# Patient Record
Sex: Female | Born: 1975 | Hispanic: Yes | Marital: Married | Smoking: Never smoker
Health system: Southern US, Community
[De-identification: ages and names within clinical notes are randomized; demographics above are authoritative.]

## PROBLEM LIST (undated history)

## (undated) ENCOUNTER — Inpatient Hospital Stay (HOSPITAL_COMMUNITY): Payer: Self-pay

## (undated) DIAGNOSIS — R112 Nausea with vomiting, unspecified: Secondary | ICD-10-CM

## (undated) DIAGNOSIS — K602 Anal fissure, unspecified: Secondary | ICD-10-CM

## (undated) DIAGNOSIS — E039 Hypothyroidism, unspecified: Secondary | ICD-10-CM

## (undated) DIAGNOSIS — Z1501 Genetic susceptibility to malignant neoplasm of breast: Secondary | ICD-10-CM

## (undated) DIAGNOSIS — A048 Other specified bacterial intestinal infections: Secondary | ICD-10-CM

## (undated) DIAGNOSIS — R600 Localized edema: Secondary | ICD-10-CM

## (undated) DIAGNOSIS — K529 Noninfective gastroenteritis and colitis, unspecified: Secondary | ICD-10-CM

## (undated) DIAGNOSIS — F32A Depression, unspecified: Secondary | ICD-10-CM

## (undated) DIAGNOSIS — Z9889 Other specified postprocedural states: Secondary | ICD-10-CM

## (undated) DIAGNOSIS — K760 Fatty (change of) liver, not elsewhere classified: Secondary | ICD-10-CM

## (undated) DIAGNOSIS — E559 Vitamin D deficiency, unspecified: Secondary | ICD-10-CM

## (undated) DIAGNOSIS — I959 Hypotension, unspecified: Secondary | ICD-10-CM

## (undated) DIAGNOSIS — Z1509 Genetic susceptibility to other malignant neoplasm: Secondary | ICD-10-CM

## (undated) HISTORY — DX: Anal fissure, unspecified: K60.2

## (undated) HISTORY — DX: Vitamin D deficiency, unspecified: E55.9

## (undated) HISTORY — DX: Genetic susceptibility to malignant neoplasm of breast: Z15.09

## (undated) HISTORY — PX: NO PAST SURGERIES: SHX2092

## (undated) HISTORY — DX: Genetic susceptibility to other malignant neoplasm: Z15.01

## (undated) HISTORY — DX: Hypotension, unspecified: I95.9

## (undated) HISTORY — DX: Localized edema: R60.0

## (undated) HISTORY — DX: Hypothyroidism, unspecified: E03.9

## (undated) HISTORY — DX: Fatty (change of) liver, not elsewhere classified: K76.0

## (undated) HISTORY — DX: Other specified bacterial intestinal infections: A04.8

---

## 2012-03-19 ENCOUNTER — Encounter (HOSPITAL_COMMUNITY): Payer: Self-pay | Admitting: *Deleted

## 2012-03-19 ENCOUNTER — Emergency Department (HOSPITAL_COMMUNITY)
Admission: EM | Admit: 2012-03-19 | Discharge: 2012-03-19 | Disposition: A | Discharge status: Home / Self Care | Payer: Medicaid Other | Attending: Emergency Medicine | Admitting: Emergency Medicine

## 2012-03-19 ENCOUNTER — Emergency Department (HOSPITAL_COMMUNITY): Payer: Medicaid Other

## 2012-03-19 DIAGNOSIS — R112 Nausea with vomiting, unspecified: Secondary | ICD-10-CM | POA: Insufficient documentation

## 2012-03-19 DIAGNOSIS — R109 Unspecified abdominal pain: Secondary | ICD-10-CM

## 2012-03-19 DIAGNOSIS — M549 Dorsalgia, unspecified: Secondary | ICD-10-CM | POA: Insufficient documentation

## 2012-03-19 DIAGNOSIS — R1013 Epigastric pain: Secondary | ICD-10-CM | POA: Insufficient documentation

## 2012-03-19 DIAGNOSIS — R42 Dizziness and giddiness: Secondary | ICD-10-CM | POA: Insufficient documentation

## 2012-03-19 DIAGNOSIS — R209 Unspecified disturbances of skin sensation: Secondary | ICD-10-CM | POA: Insufficient documentation

## 2012-03-19 LAB — COMPREHENSIVE METABOLIC PANEL
AST: 20 U/L (ref 0–37)
Alkaline Phosphatase: 54 U/L (ref 39–117)
BUN: 17 mg/dL (ref 6–23)
CO2: 25 mEq/L (ref 19–32)
Chloride: 105 mEq/L (ref 96–112)
Creatinine, Ser: 0.66 mg/dL (ref 0.50–1.10)
GFR calc non Af Amer: >90 mL/min (ref >90)
Total Bilirubin: 0.1 mg/dL — ABNORMAL LOW (ref 0.3–1.2)

## 2012-03-19 LAB — URINALYSIS, ROUTINE W REFLEX MICROSCOPIC
Bilirubin Urine: NEGATIVE
Glucose, UA: NEGATIVE mg/dL
Ketones, ur: NEGATIVE mg/dL
Leukocytes, UA: NEGATIVE
Protein, ur: NEGATIVE mg/dL

## 2012-03-19 LAB — CBC
HCT: 36.3 % (ref 36.0–46.0)
MCV: 85.2 fL (ref 78.0–100.0)
Platelets: 233 10*3/uL (ref 150–400)
RBC: 4.26 MIL/uL (ref 3.87–5.11)
WBC: 6.8 10*3/uL (ref 4.0–10.5)

## 2012-03-19 MED ORDER — MORPHINE SULFATE 4 MG/ML IJ SOLN
6.0000 mg | Freq: Once | INTRAMUSCULAR | Status: AC
  Administered 2012-03-19: 6 mg via INTRAVENOUS
  Filled 2012-03-19: qty 2

## 2012-03-19 MED ORDER — OXYCODONE HCL 5 MG PO TABS: 5.0000 mg | ORAL_TABLET | ORAL | Status: DC | PRN

## 2012-03-19 MED ORDER — ONDANSETRON HCL 4 MG/2ML IJ SOLN
4.0000 mg | Freq: Once | INTRAMUSCULAR | Status: AC
  Administered 2012-03-19: 4 mg via INTRAVENOUS
  Filled 2012-03-19: qty 2

## 2012-03-19 MED ORDER — ONDANSETRON 8 MG PO TBDP: 8.0000 mg | ORAL_TABLET | Freq: Three times a day (TID) | ORAL | Status: DC | PRN

## 2012-03-19 MED ORDER — SODIUM CHLORIDE 0.9 % IV SOLN
Freq: Once | INTRAVENOUS | Status: AC
  Administered 2012-03-19: 19:00:00 via INTRAVENOUS

## 2012-03-19 NOTE — ED Notes (Signed)
Pt states today started having a fever, upper abdominal pain, headache, dizziness, blurred vision at times and tingling on L side of face. Pt denies shortness of breath, n/v/d.

## 2012-03-19 NOTE — ED Notes (Signed)
Pt. asked to given urine specimen.

## 2012-03-19 NOTE — ED Notes (Signed)
Pt sts started feeling "dizziness and lightheaded" this morning.  Sts abdominal pain and back pain this afternoon.  Sts lips and L side of face tingling "off and on."  Sts tingling stops after she "massages" the area.  Denies weakness.

## 2012-03-19 NOTE — Progress Notes (Signed)
Pt noted without pcp listed She confirms pcp as Carol Welch at [facility] EP[facility] updated

## 2012-03-19 NOTE — ED Provider Notes (Signed)
History     CSN: [id number]  Arrival date & time 03/19/12  1738   First MD Initiated Contact with Patient 03/19/12 1820      Chief Complaint  Patient presents with  . Abdominal Pain  . Dizziness    The history is provided by the patient.   patient reports developing epigastric discomfort with radiation to the back.  She reports the pain waxes and wanes it is sharp and comes on.  She reports nausea without vomiting.  She denies diarrhea.  No known history of gallstones.  The patient denies chest pain and shortness of breath.  No history of cardiovascular disease.  No difficulty breathing.  No dysuria or urinary frequency.  No new back pain.  She denies lower abdominal pain.  Symptoms are moderate in severity.  This is something new that she's never had before.  Her symptoms are moderate in severity  History reviewed. No pertinent past medical history.  History reviewed. No pertinent past surgical history.  History reviewed. No pertinent family history.  History  Substance Use Topics  . Smoking status: Never Smoker   . Smokeless tobacco: Never Used  . Alcohol Use: No    OB History    Grav Para Term Preterm Abortions TAB SAB Ect Mult Living                  Review of Systems  Gastrointestinal: Positive for abdominal pain.  All other systems reviewed and are negative.    Allergies  Review of patient's allergies indicates no known allergies.  Home Medications   Current Outpatient Rx  Name  Route  Sig  Dispense  Refill  . ACETAMINOPHEN 325 MG PO TABS   Oral   Take 650 mg by mouth every 6 (six) hours as needed. Headache         . LEVOTHYROXINE SODIUM 100 MCG PO TABS   Oral   Take 100 mcg by mouth daily.           BP 127/76  Pulse 72  Temp 98.5 F (36.9 C) (Oral)  Resp 18  SpO2 100%  LMP 03/10/2012  Physical Exam  Nursing note and vitals reviewed. Constitutional: She is oriented to person, place, and time. She appears well-developed and  well-nourished. No distress.  HENT:  Head: Normocephalic and atraumatic.  Eyes: EOM are normal.  Neck: Normal range of motion.  Cardiovascular: Normal rate, regular rhythm and normal heart sounds.   Pulmonary/Chest: Effort normal and breath sounds normal.  Abdominal: Soft. She exhibits no distension.       Mild epigastric tenderness without guarding or rebound  Musculoskeletal: Normal range of motion.  Neurological: She is alert and oriented to person, place, and time.  Skin: Skin is warm and dry.  Psychiatric: She has a normal mood and affect. Judgment normal.    ED Course  Procedures (including critical care time)  Labs Reviewed  COMPREHENSIVE METABOLIC PANEL - Abnormal; Notable for the following:    Total Bilirubin 0.1 (*)     All other components within normal limits  CBC  LIPASE, BLOOD  URINALYSIS, ROUTINE W REFLEX MICROSCOPIC  PREGNANCY, URINE   US Abdomen Complete  03/19/2012  *RADIOLOGY REPORT*  Clinical Data:  Epigastric abdominal pain  ABDOMINAL ULTRASOUND COMPLETE  Comparison:  None.  Findings:  Gallbladder:  Limited assessment because the gallbladder appears completely contracted.  Difficult to exclude small stones.  Common Bile Duct:  Within normal limits in caliber.  Liver: No focal mass lesion  identified.  Within normal limits in parenchymal echogenicity.  IVC:  Appears normal.  Pancreas:  No abnormality identified.  Spleen:  Within normal limits in size and echotexture.  Right kidney:  Normal in size and parenchymal echogenicity.  No evidence of mass or hydronephrosis.  Left kidney:  Normal in size and parenchymal echogenicity.  No evidence of mass or hydronephrosis.  Abdominal Aorta:  No aneurysm identified.  IMPRESSION: No acute finding by ultrasound.  Completely contracted or collapsed gallbladder limiting evaluation.   Original Report Authenticated By: Judie Petit. Miles Costain, M.D.    I personally reviewed the imaging tests through PACS system I reviewed available  ER/hospitalization records through the EMR   1. Abdominal pain       MDM  10:34 PM The patient's pain is controlled at this time.  Abdominal exam is benign.  On ultrasound she has a contracted gallbladder and this may represent biliary colic.  LFTs and lipase are normal.  The patient feels better.  Gastroenterology followup.  I've instructed the patient to return to the ER tomorrow for repeat abdominal exam if she continues having discomfort         Lyanne Co, MD 03/19/12 2236

## 2012-03-20 ENCOUNTER — Encounter (HOSPITAL_COMMUNITY): Payer: Self-pay | Admitting: *Deleted

## 2012-03-20 ENCOUNTER — Emergency Department (HOSPITAL_COMMUNITY)
Admission: EM | Admit: 2012-03-20 | Discharge: 2012-03-20 | Disposition: A | Discharge status: Home / Self Care | Payer: Medicaid Other | Attending: Emergency Medicine | Admitting: Emergency Medicine

## 2012-03-20 ENCOUNTER — Ambulatory Visit (HOSPITAL_COMMUNITY): Payer: Medicaid Other

## 2012-03-20 DIAGNOSIS — R112 Nausea with vomiting, unspecified: Secondary | ICD-10-CM | POA: Insufficient documentation

## 2012-03-20 DIAGNOSIS — K5289 Other specified noninfective gastroenteritis and colitis: Secondary | ICD-10-CM | POA: Insufficient documentation

## 2012-03-20 DIAGNOSIS — K529 Noninfective gastroenteritis and colitis, unspecified: Secondary | ICD-10-CM

## 2012-03-20 DIAGNOSIS — R1011 Right upper quadrant pain: Secondary | ICD-10-CM | POA: Insufficient documentation

## 2012-03-20 DIAGNOSIS — R1013 Epigastric pain: Secondary | ICD-10-CM | POA: Insufficient documentation

## 2012-03-20 LAB — COMPREHENSIVE METABOLIC PANEL
ALT: 10 U/L (ref 0–35)
AST: 17 U/L (ref 0–37)
Albumin: 3.4 g/dL — ABNORMAL LOW (ref 3.5–5.2)
Alkaline Phosphatase: 47 U/L (ref 39–117)
BUN: 14 mg/dL (ref 6–23)
CO2: 28 mEq/L (ref 19–32)
Calcium: 8.5 mg/dL (ref 8.4–10.5)
Chloride: 104 mEq/L (ref 96–112)
Creatinine, Ser: 0.67 mg/dL (ref 0.50–1.10)
GFR calc Af Amer: >90 mL/min (ref >90)
GFR calc non Af Amer: >90 mL/min (ref >90)
Glucose, Bld: 85 mg/dL (ref 70–99)
Potassium: 3.6 mEq/L (ref 3.5–5.1)
Sodium: 139 mEq/L (ref 135–145)
Total Bilirubin: 0.2 mg/dL — ABNORMAL LOW (ref 0.3–1.2)
Total Protein: 6.9 g/dL (ref 6.0–8.3)

## 2012-03-20 LAB — URINALYSIS, ROUTINE W REFLEX MICROSCOPIC
Bilirubin Urine: NEGATIVE
Glucose, UA: NEGATIVE mg/dL
Hgb urine dipstick: NEGATIVE
Ketones, ur: NEGATIVE mg/dL
Nitrite: NEGATIVE
Protein, ur: NEGATIVE mg/dL
Specific Gravity, Urine: 1.013 (ref 1.005–1.030)
Urobilinogen, UA: 0.2 mg/dL (ref 0.0–1.0)
pH: 6.5 (ref 5.0–8.0)

## 2012-03-20 LAB — CBC WITH DIFFERENTIAL/PLATELET
Basophils Absolute: 0 10*3/uL (ref 0.0–0.1)
Basophils Relative: 1 % (ref 0–1)
Eosinophils Absolute: 0.1 10*3/uL (ref 0.0–0.7)
Eosinophils Relative: 2 % (ref 0–5)
HCT: 37.4 % (ref 36.0–46.0)
Hemoglobin: 12.9 g/dL (ref 12.0–15.0)
Lymphocytes Relative: 44 % (ref 12–46)
Lymphs Abs: 2.7 10*3/uL (ref 0.7–4.0)
MCH: 29.7 pg (ref 26.0–34.0)
MCHC: 34.5 g/dL (ref 30.0–36.0)
MCV: 86.2 fL (ref 78.0–100.0)
Monocytes Absolute: 0.4 10*3/uL (ref 0.1–1.0)
Monocytes Relative: 7 % (ref 3–12)
Neutro Abs: 3 10*3/uL (ref 1.7–7.7)
Neutrophils Relative %: 48 % (ref 43–77)
Platelets: 240 10*3/uL (ref 150–400)
RBC: 4.34 MIL/uL (ref 3.87–5.11)
RDW: 13.3 % (ref 11.5–15.5)
WBC: 6.2 10*3/uL (ref 4.0–10.5)

## 2012-03-20 LAB — PREGNANCY, URINE: Preg Test, Ur: NEGATIVE

## 2012-03-20 LAB — URINE MICROSCOPIC-ADD ON

## 2012-03-20 LAB — LIPASE, BLOOD: Lipase: 27 U/L (ref 11–59)

## 2012-03-20 MED ORDER — METRONIDAZOLE IN NACL 5-0.79 MG/ML-% IV SOLN
500.0000 mg | Freq: Three times a day (TID) | INTRAVENOUS | Status: DC
  Administered 2012-03-20: 500 mg via INTRAVENOUS
  Filled 2012-03-20: qty 100

## 2012-03-20 MED ORDER — IOHEXOL 300 MG/ML  SOLN
100.0000 mL | Freq: Once | INTRAMUSCULAR | Status: AC | PRN
  Administered 2012-03-20: 100 mL via INTRAVENOUS

## 2012-03-20 MED ORDER — OXYCODONE-ACETAMINOPHEN 5-325 MG PO TABS: 1.0000 | ORAL_TABLET | ORAL | Status: DC | PRN

## 2012-03-20 MED ORDER — METRONIDAZOLE 500 MG PO TABS: 500.0000 mg | ORAL_TABLET | Freq: Three times a day (TID) | ORAL | Status: DC

## 2012-03-20 MED ORDER — CIPROFLOXACIN HCL 500 MG PO TABS: 500.0000 mg | ORAL_TABLET | Freq: Two times a day (BID) | ORAL | Status: DC

## 2012-03-20 MED ORDER — CIPROFLOXACIN IN D5W 400 MG/200ML IV SOLN
400.0000 mg | Freq: Two times a day (BID) | INTRAVENOUS | Status: DC
  Administered 2012-03-20: 400 mg via INTRAVENOUS
  Filled 2012-03-20: qty 200

## 2012-03-20 MED ORDER — HYDROMORPHONE HCL PF 1 MG/ML IJ SOLN
1.0000 mg | Freq: Once | INTRAMUSCULAR | Status: AC
  Administered 2012-03-20: 1 mg via INTRAVENOUS
  Filled 2012-03-20: qty 1

## 2012-03-20 MED ORDER — KETOROLAC TROMETHAMINE 30 MG/ML IJ SOLN
15.0000 mg | Freq: Once | INTRAMUSCULAR | Status: AC
  Administered 2012-03-20: 15 mg via INTRAVENOUS
  Filled 2012-03-20: qty 1

## 2012-03-20 MED ORDER — SODIUM CHLORIDE 0.9 % IV BOLUS (SEPSIS)
1000.0000 mL | Freq: Once | INTRAVENOUS | Status: AC
  Administered 2012-03-20: 1000 mL via INTRAVENOUS

## 2012-03-20 MED ORDER — ONDANSETRON HCL 4 MG/2ML IJ SOLN
4.0000 mg | Freq: Once | INTRAMUSCULAR | Status: AC
  Administered 2012-03-20: 4 mg via INTRAVENOUS
  Filled 2012-03-20: qty 2

## 2012-03-20 NOTE — ED Notes (Signed)
Pt reports being seen here yesterday for same, was discharged home with rxs without relief.  Pt reports she was instructed to come back if she did not feel better.  Pt reports epigastric and pelvic pain with n/v/  Denies any diarrhea at this time.

## 2012-03-20 NOTE — ED Provider Notes (Signed)
History    36 year old female with abdominal pain. Onset was yesterday morning and progressively worsening. Patient was seen in the emergency room yesterday for the same. She had a right upper quadrant ultrasound which showed a contracted gallbladder, but was otherwise unremarkable. Patient is returning now because her pain is not significantly improved. Waxes and wanes but doesn't resolve. Crampy and occasionally sharp. No appreciable exacerbating or relieving factors.  Associated with nausea and one episode of vomiting earlier today. Nonbilious nonbloody. No diarrhea. No urinary complaints. No unusual vaginal bleeding or discharge. Patient does not think she is pregnant. No history of prior pelvic or abdominal surgery.  CSN: [id number]  Arrival date & time 03/20/12  1119   First MD Initiated Contact with Patient 03/20/12 1251      Chief Complaint  Patient presents with  . Nausea  . Emesis  . Abdominal Pain    (Consider location/radiation/quality/duration/timing/severity/associated sxs/prior treatment) HPI  History reviewed. No pertinent past medical history.  History reviewed. No pertinent past surgical history.  No family history on file.  History  Substance Use Topics  . Smoking status: Never Smoker   . Smokeless tobacco: Never Used  . Alcohol Use: No    OB History    Grav Para Term Preterm Abortions TAB SAB Ect Mult Living                  Review of Systems   Review of symptoms negative unless otherwise noted in HPI.   Allergies  Review of patient's allergies indicates no known allergies.  Home Medications   Current Outpatient Rx  Name  Route  Sig  Dispense  Refill  . ACETAMINOPHEN 325 MG PO TABS   Oral   Take 650 mg by mouth every 6 (six) hours as needed. Headache         . LEVOTHYROXINE SODIUM 100 MCG PO TABS   Oral   Take 100 mcg by mouth daily.         Marland Kitchen ONDANSETRON 8 MG PO TBDP   Oral   Take 1 tablet (8 mg total) by mouth every 8 (eight)  hours as needed for nausea.   10 tablet   0   . OXYCODONE HCL 5 MG PO TABS   Oral   Take 1 tablet (5 mg total) by mouth every 4 (four) hours as needed for pain.   15 tablet   0   . CIPROFLOXACIN HCL 500 MG PO TABS   Oral   Take 1 tablet (500 mg total) by mouth every 12 (twelve) hours.   14 tablet   0   . METRONIDAZOLE 500 MG PO TABS   Oral   Take 1 tablet (500 mg total) by mouth 3 (three) times daily.   21 tablet   0   . OXYCODONE-ACETAMINOPHEN 5-325 MG PO TABS   Oral   Take 1-2 tablets by mouth every 4 (four) hours as needed for pain.   15 tablet   0     BP 121/70  Pulse 84  Temp 98.7 F (37.1 C) (Oral)  Resp 18  SpO2 100%  LMP 03/10/2012  Physical Exam  Nursing note and vitals reviewed. Constitutional: She appears well-developed and well-nourished. No distress.  HENT:  Head: Normocephalic and atraumatic.  Eyes: Conjunctivae normal are normal. Right eye exhibits no discharge. Left eye exhibits no discharge.  Neck: Neck supple.  Cardiovascular: Normal rate, regular rhythm and normal heart sounds.  Exam reveals no gallop and no friction rub.  No murmur heard. Pulmonary/Chest: Effort normal and breath sounds normal. No respiratory distress.  Abdominal: Soft. She exhibits no distension. There is no tenderness.       Patient reports the region of her epigastrium and umbilicus as where she is having pain. She is actually tender to palpation suprapubically though. Voluntary guarding. No rebound. No distention. No mass palpated.  Genitourinary:       No costovertebral angle tenderness  Musculoskeletal: She exhibits no edema and no tenderness.  Neurological: She is alert.  Skin: Skin is warm and dry.  Psychiatric: She has a normal mood and affect. Her behavior is normal. Thought content normal.    ED Course  Procedures (including critical care time)  Labs Reviewed  COMPREHENSIVE METABOLIC PANEL - Abnormal; Notable for the following:    Albumin 3.4 (*)      Total Bilirubin 0.2 (*)     All other components within normal limits  URINALYSIS, ROUTINE W REFLEX MICROSCOPIC - Abnormal; Notable for the following:    APPearance CLOUDY (*)     Leukocytes, UA SMALL (*)     All other components within normal limits  CBC WITH DIFFERENTIAL  LIPASE, BLOOD  PREGNANCY, URINE  URINE MICROSCOPIC-ADD ON   US Abdomen Complete  03/19/2012  *RADIOLOGY REPORT*  Clinical Data:  Epigastric abdominal pain  ABDOMINAL ULTRASOUND COMPLETE  Comparison:  None.  Findings:  Gallbladder:  Limited assessment because the gallbladder appears completely contracted.  Difficult to exclude small stones.  Common Bile Duct:  Within normal limits in caliber.  Liver: No focal mass lesion identified.  Within normal limits in parenchymal echogenicity.  IVC:  Appears normal.  Pancreas:  No abnormality identified.  Spleen:  Within normal limits in size and echotexture.  Right kidney:  Normal in size and parenchymal echogenicity.  No evidence of mass or hydronephrosis.  Left kidney:  Normal in size and parenchymal echogenicity.  No evidence of mass or hydronephrosis.  Abdominal Aorta:  No aneurysm identified.  IMPRESSION: No acute finding by ultrasound.  Completely contracted or collapsed gallbladder limiting evaluation.   Original Report Authenticated By: Judie Petit. Miles Costain, M.D.    Ct Abdomen Pelvis W Contrast  03/20/2012  *RADIOLOGY REPORT*  Clinical Data: Epigastric pain, nausea/vomiting  CT ABDOMEN AND PELVIS WITH CONTRAST  Technique:  Multidetector CT imaging of the abdomen and pelvis was performed following the standard protocol during bolus administration of intravenous contrast.  Contrast: OMNIPAQUE IOHEXOL 300 MG/ML  SOLN  Comparison: None.  Findings: Lung bases are essentially clear.  11 mm hypervascular lesion in the inferior right hepatic lobe (series 2/image 29), incompletely characterized, likely benign.  Spleen, pancreas, and adrenal glands are within normal limits.  Gallbladder is  unremarkable.  No intrahepatic or extrahepatic ductal dilatation.  Kidneys are within normal limits.  No hydronephrosis.  No evidence of bowel obstruction.  Normal appendix.  Multifocal areas of colonic wall thickening involving the cecum (series 2/image 50), hepatic flexure (series 2/image 38) and mid/distal transverse colon (series 2/image 32, suspicious for infectious/inflammatory colitis.  Terminal ileum appears unremarkable.  No evidence of abdominal aortic aneurysm.  No abdominopelvic ascites.  No suspicious abdominopelvic lymphadenopathy.  Uterus and bilateral ovaries unremarkable.  Bladder is within normal limits.  Visualized osseous structures are within normal limits.  IMPRESSION: Suspected multifocal infectious/inflammatory colitis, as described above.  No evidence of bowel obstruction.  Normal appendix.  Terminal ileum appears unremarkable.   Original Report Authenticated By: Charline Bills, M.D.      1. Colitis  MDM  36 year old female with abdominal pain. CT significant for colitis. Will treat for presumptive infectious etiology. Patient is non-peritoneal. No ongoing vomiting. We'll give a dose of IV antibiotics in the emergency room. Will DC w/ prescriptions for same. When necessary pain medication. Return precautions were discussed. Outpatient followup otherwise.        Raeford Razor, MD 03/20/12 [phone]

## 2012-03-20 NOTE — ED Notes (Signed)
Pt resting in bed.  Family at b/s.  Has returned from bathroom where she has voided.  C/O abdominal pain 7/10.  Pain meds given.   Has finished oral contrast.

## 2012-03-20 NOTE — ED Notes (Signed)
Pt states dx with gallbladder inflammation last night in [facility]. Pt states the pain has continued along with nausea/vomiting. Pt states did have pain/nausea medications filled, but has not helped. Pt unable to bear the pain. Pain 7/10.

## 2012-03-23 ENCOUNTER — Other Ambulatory Visit: Payer: Self-pay | Admitting: Physician Assistant

## 2012-03-23 DIAGNOSIS — N921 Excessive and frequent menstruation with irregular cycle: Secondary | ICD-10-CM

## 2012-03-23 DIAGNOSIS — N946 Dysmenorrhea, unspecified: Secondary | ICD-10-CM

## 2012-03-26 ENCOUNTER — Other Ambulatory Visit: Payer: Self-pay | Admitting: Specialist

## 2012-03-26 DIAGNOSIS — N946 Dysmenorrhea, unspecified: Secondary | ICD-10-CM

## 2012-03-26 DIAGNOSIS — N921 Excessive and frequent menstruation with irregular cycle: Secondary | ICD-10-CM

## 2012-03-27 ENCOUNTER — Ambulatory Visit
Admission: RE | Admit: 2012-03-27 | Discharge: 2012-03-27 | Disposition: A | Discharge status: Home / Self Care | Payer: Medicaid Other | Source: Ambulatory Visit | Attending: Physician Assistant | Admitting: Physician Assistant

## 2012-03-27 DIAGNOSIS — N946 Dysmenorrhea, unspecified: Secondary | ICD-10-CM

## 2012-03-27 DIAGNOSIS — N921 Excessive and frequent menstruation with irregular cycle: Secondary | ICD-10-CM

## 2012-06-08 ENCOUNTER — Encounter (HOSPITAL_COMMUNITY): Payer: Self-pay | Admitting: *Deleted

## 2012-06-08 ENCOUNTER — Emergency Department (HOSPITAL_COMMUNITY)
Admission: EM | Admit: 2012-06-08 | Discharge: 2012-06-08 | Disposition: A | Discharge status: Home / Self Care | Payer: Medicaid Other | Attending: Emergency Medicine | Admitting: Emergency Medicine

## 2012-06-08 ENCOUNTER — Emergency Department (HOSPITAL_COMMUNITY): Payer: Medicaid Other

## 2012-06-08 DIAGNOSIS — Z3202 Encounter for pregnancy test, result negative: Secondary | ICD-10-CM | POA: Insufficient documentation

## 2012-06-08 DIAGNOSIS — Z8719 Personal history of other diseases of the digestive system: Secondary | ICD-10-CM | POA: Insufficient documentation

## 2012-06-08 DIAGNOSIS — R509 Fever, unspecified: Secondary | ICD-10-CM | POA: Insufficient documentation

## 2012-06-08 DIAGNOSIS — R109 Unspecified abdominal pain: Secondary | ICD-10-CM

## 2012-06-08 DIAGNOSIS — K921 Melena: Secondary | ICD-10-CM | POA: Insufficient documentation

## 2012-06-08 DIAGNOSIS — Z79899 Other long term (current) drug therapy: Secondary | ICD-10-CM | POA: Insufficient documentation

## 2012-06-08 DIAGNOSIS — K5289 Other specified noninfective gastroenteritis and colitis: Secondary | ICD-10-CM | POA: Insufficient documentation

## 2012-06-08 HISTORY — DX: Noninfective gastroenteritis and colitis, unspecified: K52.9

## 2012-06-08 LAB — CBC WITH DIFFERENTIAL/PLATELET
Basophils Absolute: 0 10*3/uL (ref 0.0–0.1)
Basophils Relative: 0 % (ref 0–1)
Eosinophils Relative: 2 % (ref 0–5)
HCT: 39.7 % (ref 36.0–46.0)
MCHC: 34.8 g/dL (ref 30.0–36.0)
MCV: 85.2 fL (ref 78.0–100.0)
Monocytes Absolute: 0.5 10*3/uL (ref 0.1–1.0)
RDW: 13.4 % (ref 11.5–15.5)

## 2012-06-08 LAB — URINALYSIS, ROUTINE W REFLEX MICROSCOPIC
Glucose, UA: NEGATIVE mg/dL
Ketones, ur: NEGATIVE mg/dL
Protein, ur: NEGATIVE mg/dL

## 2012-06-08 LAB — BASIC METABOLIC PANEL
Calcium: 8.5 mg/dL (ref 8.4–10.5)
Creatinine, Ser: 0.54 mg/dL (ref 0.50–1.10)
GFR calc Af Amer: >90 mL/min (ref >90)

## 2012-06-08 LAB — POCT PREGNANCY, URINE: Preg Test, Ur: NEGATIVE

## 2012-06-08 MED ORDER — METHYLPREDNISOLONE SODIUM SUCC 125 MG IJ SOLR
125.0000 mg | Freq: Once | INTRAMUSCULAR | Status: AC
  Administered 2012-06-08: 125 mg via INTRAVENOUS
  Filled 2012-06-08: qty 2

## 2012-06-08 MED ORDER — ONDANSETRON HCL 4 MG/2ML IJ SOLN
4.0000 mg | Freq: Once | INTRAMUSCULAR | Status: AC
  Administered 2012-06-08: 4 mg via INTRAVENOUS
  Filled 2012-06-08: qty 2

## 2012-06-08 MED ORDER — HYDROCODONE-ACETAMINOPHEN 5-325 MG PO TABS: 1.0000 | ORAL_TABLET | ORAL | Status: DC | PRN

## 2012-06-08 MED ORDER — IOHEXOL 300 MG/ML  SOLN: 25.0000 mL | Freq: Once | INTRAMUSCULAR | Status: DC | PRN

## 2012-06-08 MED ORDER — HYDROMORPHONE HCL PF 1 MG/ML IJ SOLN
1.0000 mg | Freq: Once | INTRAMUSCULAR | Status: AC
  Administered 2012-06-08: 1 mg via INTRAVENOUS
  Filled 2012-06-08: qty 1

## 2012-06-08 MED ORDER — IOHEXOL 300 MG/ML  SOLN
100.0000 mL | Freq: Once | INTRAMUSCULAR | Status: AC | PRN
  Administered 2012-06-08: 100 mL via INTRAVENOUS

## 2012-06-08 MED ORDER — ONDANSETRON 8 MG PO TBDP: ORAL_TABLET | ORAL | Status: DC

## 2012-06-08 MED ORDER — SODIUM CHLORIDE 0.9 % IV BOLUS (SEPSIS)
1000.0000 mL | Freq: Once | INTRAVENOUS | Status: AC
  Administered 2012-06-08: 1000 mL via INTRAVENOUS

## 2012-06-08 NOTE — ED Notes (Signed)
Pt reports RLQ pain which radiates to her pelvic area with nausea since the 19th of last month.  Pt reports seeing her PMD on the 24th and was given abx rx without relief.  Pt reports being seen here for same in the past and was dx with colitis.  Pt does not have a GI MD.  Pt also reports "light red" rectal bleeding-denies any hemorrhoids.

## 2012-06-08 NOTE — ED Provider Notes (Signed)
History     CSN: [id number]  Arrival date & time 06/08/12  1818   First MD Initiated Contact with Patient 06/08/12 1959      Chief Complaint  Patient presents with  . Abdominal Pain   HPI  History provided by the patient and recent medical chart. Patient is a 37 year old female with no significant PMH who presents with complaints of worsened abdominal pain. Patient states she has had abdominal pain since January 19. Prior to that she had similar symptoms last November was diagnosed and treated for her infectious colitis. Since that time she has been on repeat antibiotics and is currently taking Cipro since January 24. Over the past 2 days she reports having fever, chills and worsening abdominal pains. Patient also reports seeing some blood mixed in with her stools. Symptoms seem worse with movements and walking. She denies any other aggravating or alleviating factors. Symptoms have been associated with slight nausea but denies any vomiting. She denies any other associated symptoms. Denies any urinary complaints or changes with menstruation. Denies any vaginal bleeding or vaginal discharge. Patient has never had a colonoscopy were seen a GI specialist. She believes one of her aunts may have had some kind of intestinal condition with history of bowel resection but is unsure of the condition. No other pertinent family history.     Past Medical History  Diagnosis Date  . Colitis     History reviewed. No pertinent past surgical history.  No family history on file.  History  Substance Use Topics  . Smoking status: Never Smoker   . Smokeless tobacco: Never Used  . Alcohol Use: No    OB History    Grav Para Term Preterm Abortions TAB SAB Ect Mult Living                  Review of Systems  Constitutional: Positive for fever and chills.  Gastrointestinal: Positive for abdominal pain and blood in stool. Negative for nausea, vomiting, diarrhea, constipation and rectal pain.   Genitourinary: Negative for dysuria, frequency, hematuria, flank pain, vaginal bleeding, vaginal discharge and menstrual problem.  All other systems reviewed and are negative.    Allergies  Review of patient's allergies indicates no known allergies.  Home Medications   Current Outpatient Rx  Name  Route  Sig  Dispense  Refill  . CIPROFLOXACIN HCL 500 MG PO TABS   Oral   Take 1 tablet (500 mg total) by mouth every 12 (twelve) hours.   14 tablet   0   . IBUPROFEN 200 MG PO TABS   Oral   Take 200 mg by mouth every 6 (six) hours as needed. Pain.         Marland Kitchen LEVOTHYROXINE SODIUM 100 MCG PO TABS   Oral   Take 100 mcg by mouth daily.           BP 129/86  Pulse 77  Temp 98.6 F (37 C) (Oral)  Resp 20  SpO2 100%  LMP 06/02/2012  Physical Exam  Nursing note and vitals reviewed. Constitutional: She is oriented to person, place, and time. She appears well-developed and well-nourished. No distress.  HENT:  Head: Normocephalic.  Eyes: Conjunctivae normal are normal.  Neck: Normal range of motion.  Cardiovascular: Normal rate and regular rhythm.   Pulmonary/Chest: Effort normal and breath sounds normal. No respiratory distress.  Abdominal: Soft. There is no hepatosplenomegaly. There is tenderness in the left lower quadrant. There is rebound. There is no guarding, no CVA  tenderness, no tenderness at McBurney's point and negative Murphy's sign.       There is diffuse tenderness. There is slight rebound and tenderness. Pain appears worse in the left lower quadrant palpation. No rigidity.  Musculoskeletal: Normal range of motion.  Neurological: She is alert and oriented to person, place, and time.  Skin: Skin is warm and dry. No rash noted.  Psychiatric: She has a normal mood and affect. Her behavior is normal.    ED Course  Procedures   Results for orders placed during the hospital encounter of 06/08/12  URINALYSIS, ROUTINE W REFLEX MICROSCOPIC      Component Value Range    Color, Urine YELLOW  YELLOW   APPearance TURBID (*) CLEAR   Specific Gravity, Urine 1.013  1.005 - 1.030   pH 6.5  5.0 - 8.0   Glucose, UA NEGATIVE  NEGATIVE mg/dL   Hgb urine dipstick TRACE (*) NEGATIVE   Bilirubin Urine NEGATIVE  NEGATIVE   Ketones, ur NEGATIVE  NEGATIVE mg/dL   Protein, ur NEGATIVE  NEGATIVE mg/dL   Urobilinogen, UA 0.2  0.0 - 1.0 mg/dL   Nitrite NEGATIVE  NEGATIVE   Leukocytes, UA NEGATIVE  NEGATIVE  CBC WITH DIFFERENTIAL      Component Value Range   WBC 8.0  4.0 - 10.5 K/uL   RBC 4.66  3.87 - 5.11 MIL/uL   Hemoglobin 13.8  12.0 - 15.0 g/dL   HCT 16.1  09.6 - 04.5 %   MCV 85.2  78.0 - 100.0 fL   MCH 29.6  26.0 - 34.0 pg   MCHC 34.8  30.0 - 36.0 g/dL   RDW 40.9  81.1 - 91.4 %   Platelets 255  150 - 400 K/uL   Neutrophils Relative 53  43 - 77 %   Neutro Abs 4.2  1.7 - 7.7 K/uL   Lymphocytes Relative 38  12 - 46 %   Lymphs Abs 3.0  0.7 - 4.0 K/uL   Monocytes Relative 6  3 - 12 %   Monocytes Absolute 0.5  0.1 - 1.0 K/uL   Eosinophils Relative 2  0 - 5 %   Eosinophils Absolute 0.2  0.0 - 0.7 K/uL   Basophils Relative 0  0 - 1 %   Basophils Absolute 0.0  0.0 - 0.1 K/uL  BASIC METABOLIC PANEL      Component Value Range   Sodium 135  135 - 145 mEq/L   Potassium 4.1  3.5 - 5.1 mEq/L   Chloride 101  96 - 112 mEq/L   CO2 24  19 - 32 mEq/L   Glucose, Bld 91  70 - 99 mg/dL   BUN 15  6 - 23 mg/dL   Creatinine, Ser 7.82  0.50 - 1.10 mg/dL   Calcium 8.5  8.4 - 95.6 mg/dL   GFR calc non Af Amer >90  >90 mL/min   GFR calc Af Amer >90  >90 mL/min  POCT PREGNANCY, URINE      Component Value Range   Preg Test, Ur NEGATIVE  NEGATIVE  URINE MICROSCOPIC-ADD ON      Component Value Range   Squamous Epithelial / LPF FEW (*) RARE   RBC / HPF 0-2  <3 RBC/hpf   Bacteria, UA RARE  RARE      Ct Abdomen Pelvis W Contrast  06/08/2012  *RADIOLOGY REPORT*  Clinical Data: Right lower quadrant pain extending into the pelvic region.  Nausea.  CT ABDOMEN AND PELVIS WITH  CONTRAST  Technique:  Multidetector CT imaging of the abdomen and pelvis was performed following the standard protocol during bolus administration of intravenous contrast.  Contrast: OMNIPAQUE IOHEXOL 300 MG/ML  SOLN  Comparison: 03/27/2012 pelvic ultrasound.  03/20/2012 CT abdomen and pelvis.  Findings: Nonspecific 1.2 x 1.3 x 1.4 cm hypervascular lesion inferior aspect right lobe liver (series 2 image 26 and series 5 image 24) unchanged. Etiology indeterminate.  This is likely benign.  If further delineation is clinically desired, contrast enhanced MR can be performed for further delineation.  No extraluminal bowel inflammatory process, free fluid or free air. Specifically, no inflammation surrounds the appendix or involves the colon.  There are a few fluid filled dilated loops of small bowel involving the distal ileum within the right lower quadrant. Etiology/significance indeterminate.  No focal splenic, adrenal, renal or pancreatic lesion.  No calcified gallstones.  No abdominal aortic aneurysm.  No adenopathy.  No bony destructive lesion.  Small ovarian cysts/follicles incidentally noted.  Urinary bladder unremarkable.  IMPRESSION: No extraluminal bowel inflammatory process, free fluid or free air. Specifically, no inflammation surrounds the appendix or involves the colon.  There are a few fluid filled dilated loops of small bowel involving the distal ileum within the right lower quadrant. Etiology/significance indeterminate.  Stable hypervascular lesion inferior aspect right lobe liver. Please see above.   Original Report Authenticated By: Lacy Duverney, M.D.      1. Abdominal pain       MDM  8:15 PM patient seen and evaluated. Patient appears in moderate discomfort.  Lab tests, pain medication and antiemetics ordered. Review of previous CAT scan last November showed signs of diffuse inflammation concerning for possible infectious or inflammatory condition. Patient has been on several  antibiotics and continues to be on Cipro since the 24th without any improvement of symptoms. Abdominal exam concerning for slight rebound tenderness. Will obtain CT to rule out any complication of possible inflammatory bowel disease such as perforation or abscess collection.  Patient feeling better after medications. Abdomen pain is improved significantly. She is tolerating by mouth fluids.  Lab tests and CT scan unremarkable for acute or emergent condition at this time. Plan to give patient prescriptions for symptomatic treatment and GI referral. Patient agrees with this plan.     Angus Seller, PA 06/08/12 2250

## 2012-06-11 NOTE — ED Provider Notes (Signed)
Medical screening examination/treatment/procedure(s) were performed by non-physician practitioner and as supervising physician I was immediately available for consultation/collaboration.   Jameriah Trotti, MD 06/11/12 0700 

## 2012-10-26 ENCOUNTER — Encounter (HOSPITAL_COMMUNITY): Payer: Self-pay | Admitting: *Deleted

## 2012-10-26 ENCOUNTER — Emergency Department (HOSPITAL_COMMUNITY): Payer: Medicaid Other

## 2012-10-26 ENCOUNTER — Emergency Department (HOSPITAL_COMMUNITY)
Admission: EM | Admit: 2012-10-26 | Discharge: 2012-10-26 | Disposition: A | Discharge status: Home / Self Care | Payer: Medicaid Other | Attending: Emergency Medicine | Admitting: Emergency Medicine

## 2012-10-26 DIAGNOSIS — R0602 Shortness of breath: Secondary | ICD-10-CM | POA: Insufficient documentation

## 2012-10-26 DIAGNOSIS — Z8719 Personal history of other diseases of the digestive system: Secondary | ICD-10-CM | POA: Insufficient documentation

## 2012-10-26 DIAGNOSIS — R079 Chest pain, unspecified: Secondary | ICD-10-CM

## 2012-10-26 DIAGNOSIS — R0789 Other chest pain: Secondary | ICD-10-CM | POA: Insufficient documentation

## 2012-10-26 LAB — BASIC METABOLIC PANEL
BUN: 18 mg/dL (ref 6–23)
Chloride: 105 mEq/L (ref 96–112)
Creatinine, Ser: 0.57 mg/dL (ref 0.50–1.10)
GFR calc Af Amer: >90 mL/min (ref >90)
Glucose, Bld: 101 mg/dL — ABNORMAL HIGH (ref 70–99)

## 2012-10-26 LAB — CBC
HCT: 35.6 % — ABNORMAL LOW (ref 36.0–46.0)
MCHC: 33.1 g/dL (ref 30.0–36.0)
MCV: 83.4 fL (ref 78.0–100.0)
RDW: 13.3 % (ref 11.5–15.5)
WBC: 7.9 10*3/uL (ref 4.0–10.5)

## 2012-10-26 LAB — POCT I-STAT TROPONIN I

## 2012-10-26 MED ORDER — ASPIRIN 81 MG PO CHEW
162.0000 mg | CHEWABLE_TABLET | Freq: Once | ORAL | Status: AC
  Administered 2012-10-26: 162 mg via ORAL
  Filled 2012-10-26: qty 2

## 2012-10-26 MED ORDER — ASPIRIN EC 325 MG PO TBEC: 325.0000 mg | DELAYED_RELEASE_TABLET | Freq: Every day | ORAL | Status: DC

## 2012-10-26 NOTE — ED Provider Notes (Signed)
History    CSN: [id number] Arrival date & time 10/26/12  4098  First MD Initiated Contact with Patient 10/26/12 2117     Chief Complaint  Patient presents with  . Chest Pain  . Shortness of Breath   (Consider location/radiation/quality/duration/timing/severity/associated sxs/prior Treatment) HPI Comments: PT comes in with cc of chest pain. Pt has no medical hx, no substance abuse, no PE, DVT hx or risk factors for the same. Pt states that she started having some left sided, sharp, pressure like chest pain this evening. The episode lasted for about 5 minutes. With the episode she had some dib. No nausea, diaphoresis.   Patient is a 37 y.o. female presenting with chest pain and shortness of breath. The history is provided by the patient.  Chest Pain Associated symptoms: shortness of breath   Associated symptoms: no abdominal pain, no diaphoresis, no headache, no nausea and not vomiting   Shortness of Breath Associated symptoms: chest pain   Associated symptoms: no abdominal pain, no diaphoresis, no headaches, no neck pain and no vomiting    Past Medical History  Diagnosis Date  . Colitis    History reviewed. No pertinent past surgical history. No family history on file. History  Substance Use Topics  . Smoking status: Never Smoker   . Smokeless tobacco: Never Used  . Alcohol Use: No   OB History   Grav Para Term Preterm Abortions TAB SAB Ect Mult Living                 Review of Systems  Constitutional: Negative for diaphoresis and activity change.  HENT: Negative for neck pain.   Respiratory: Positive for shortness of breath.   Cardiovascular: Positive for chest pain.  Gastrointestinal: Negative for nausea, vomiting and abdominal pain.  Genitourinary: Negative for dysuria.  Neurological: Negative for headaches.    Allergies  Review of patient's allergies indicates no known allergies.  Home Medications   Current Outpatient Rx  Name  Route  Sig  Dispense   Refill  . ibuprofen (ADVIL,MOTRIN) 200 MG tablet   Oral   Take 200 mg by mouth every 6 (six) hours as needed. Pain.         Marland Kitchen levothyroxine (SYNTHROID, LEVOTHROID) 100 MCG tablet   Oral   Take 100 mcg by mouth every morning.           BP 145/81  Pulse 81  Temp(Src) 98.6 F (37 C) (Oral)  Resp 16  SpO2 100%  LMP 10/24/2012 Physical Exam  Nursing note and vitals reviewed. Constitutional: She is oriented to person, place, and time. She appears well-developed and well-nourished.  HENT:  Head: Normocephalic and atraumatic.  Eyes: EOM are normal. Pupils are equal, round, and reactive to light.  Neck: Neck supple.  Cardiovascular: Normal rate, regular rhythm and normal heart sounds.   No murmur heard. Pulmonary/Chest: Effort normal. No respiratory distress.  Abdominal: Soft. She exhibits no distension. There is no tenderness. There is no rebound and no guarding.  Neurological: She is alert and oriented to person, place, and time.  Skin: Skin is warm and dry.    ED Course  Procedures (including critical care time) Labs Reviewed  CBC - Abnormal; Notable for the following:    Hemoglobin 11.8 (*)    HCT 35.6 (*)    All other components within normal limits  BASIC METABOLIC PANEL - Abnormal; Notable for the following:    Potassium 3.4 (*)    Glucose, Bld 101 (*)  All other components within normal limits  POCT I-STAT TROPONIN I  POCT I-STAT TROPONIN I   Dg Chest 2 View  10/26/2012   *RADIOLOGY REPORT*  Clinical Data: Chest pain, shortness of breath.  CHEST - 2 VIEW  Comparison: None  Findings: Heart and mediastinal contours are within normal limits. No focal opacities or effusions.  No acute bony abnormality.  IMPRESSION: Negative exam.   Original Report Authenticated By: Charlett Nose, M.D.   No diagnosis found.  MDM   Date: 10/26/2012  Rate: 80  Rhythm: normal sinus rhythm  QRS Axis: normal  Intervals: normal  ST/T Wave abnormalities: normal  Conduction  Disutrbances: none  Narrative Interpretation: unremarkable  Differential diagnosis includes: ACS syndrome CHF exacerbation Valvular disorder Myocarditis Pericarditis Pericardial effusion Pneumonia Pleural effusion Pulmonary edema PE Anemia  Pt comes in with cc of chest pain. Single episode, unprovoked - 0 cardiac risk factors -still having periods -so has negative risk factors. Trops x 2 for ACS r/o. Wells score is 0, PERC negative.  Requested pcp follow up in 3 days. On my exam, the pain was worse with palpation - so possibly chest wall pain component as well.    Derwood Kaplan, MD 10/26/12 [id number]

## 2012-10-26 NOTE — ED Notes (Signed)
Pt states was sitting in car w/ AC on waiting to pick daughter up when started having a sharp pain in L side of chest, states also very short of breath and lightheaded, states now pain 5/10 and pressure to L side of chest, still having shortness of breath and states she feels tired, denies n/v/d, denies numbness/tingling.

## 2012-10-26 NOTE — Discharge Instructions (Signed)
We saw you in the ER for the chest pain/shortness of breath. °All of our cardiac workup is normal, including labs, EKG and chest X-RAY are normal. °We are not sure what is causing your discomfort, but we feel comfortable sending you home at this time. The workup in the ER is not complete, and you should follow up with your primary care doctor for further evaluation. ° ° °Chest Pain (Nonspecific) °It is often hard to give a specific diagnosis for the cause of chest pain. There is always a chance that your pain could be related to something serious, such as a heart attack or a blood clot in the lungs. You need to follow up with your caregiver for further evaluation. °CAUSES  °· Heartburn. °· Pneumonia or bronchitis. °· Anxiety or stress. °· Inflammation around your heart (pericarditis) or lung (pleuritis or pleurisy). °· A blood clot in the lung. °· A collapsed lung (pneumothorax). It can develop suddenly on its own (spontaneous pneumothorax) or from injury (trauma) to the chest. °· Shingles infection (herpes zoster virus). °The chest wall is composed of bones, muscles, and cartilage. Any of these can be the source of the pain. °· The bones can be bruised by injury. °· The muscles or cartilage can be strained by coughing or overwork. °· The cartilage can be affected by inflammation and become sore (costochondritis). °DIAGNOSIS  °Lab tests or other studies, such as X-rays, electrocardiography, stress testing, or cardiac imaging, may be needed to find the cause of your pain.  °TREATMENT  °· Treatment depends on what may be causing your chest pain. Treatment may include: °· Acid blockers for heartburn. °· Anti-inflammatory medicine. °· Pain medicine for inflammatory conditions. °· Antibiotics if an infection is present. °· You may be advised to change lifestyle habits. This includes stopping smoking and avoiding alcohol, caffeine, and chocolate. °· You may be advised to keep your head raised (elevated) when sleeping.  This reduces the chance of acid going backward from your stomach into your esophagus. °· Most of the time, nonspecific chest pain will improve within 2 to 3 days with rest and mild pain medicine. °HOME CARE INSTRUCTIONS  °· If antibiotics were prescribed, take your antibiotics as directed. Finish them even if you start to feel better. °· For the next few days, avoid physical activities that bring on chest pain. Continue physical activities as directed. °· Do not smoke. °· Avoid drinking alcohol. °· Only take over-the-counter or prescription medicine for pain, discomfort, or fever as directed by your caregiver. °· Follow your caregiver's suggestions for further testing if your chest pain does not go away. °· Keep any follow-up appointments you made. If you do not go to an appointment, you could develop lasting (chronic) problems with pain. If there is any problem keeping an appointment, you must call to reschedule. °SEEK MEDICAL CARE IF:  °· You think you are having problems from the medicine you are taking. Read your medicine instructions carefully. °· Your chest pain does not go away, even after treatment. °· You develop a rash with blisters on your chest. °SEEK IMMEDIATE MEDICAL CARE IF:  °· You have increased chest pain or pain that spreads to your arm, neck, jaw, back, or abdomen. °· You develop shortness of breath, an increasing cough, or you are coughing up blood. °· You have severe back or abdominal pain, feel nauseous, or vomit. °· You develop severe weakness, fainting, or chills. °· You have a fever. °THIS IS AN EMERGENCY. Do not wait to   see if the pain will go away. Get medical help at once. Call your local emergency services (911 in [place] Do not drive yourself to the hospital. °MAKE SURE YOU:  °· Understand these instructions. °· Will watch your condition. °· Will get help right away if you are not doing well or get worse. °Document Released: 01/30/2005 Document Revised: 07/15/2011 Document Reviewed:  11/26/2007 °[employer]® Patient Information ©2014 [employer]. ° °

## 2013-01-11 ENCOUNTER — Emergency Department (HOSPITAL_COMMUNITY): Payer: Medicaid Other

## 2013-01-11 ENCOUNTER — Emergency Department (HOSPITAL_COMMUNITY)
Admission: EM | Admit: 2013-01-11 | Discharge: 2013-01-11 | Disposition: A | Discharge status: Home / Self Care | Payer: Medicaid Other | Attending: Emergency Medicine | Admitting: Emergency Medicine

## 2013-01-11 ENCOUNTER — Encounter (HOSPITAL_COMMUNITY): Payer: Self-pay | Admitting: Emergency Medicine

## 2013-01-11 ENCOUNTER — Emergency Department (HOSPITAL_COMMUNITY)
Admission: EM | Admit: 2013-01-11 | Discharge: 2013-01-11 | Disposition: A | Discharge status: Home / Self Care | Payer: Medicaid Other | Source: Home / Self Care | Attending: Family Medicine | Admitting: Family Medicine

## 2013-01-11 DIAGNOSIS — R110 Nausea: Secondary | ICD-10-CM | POA: Insufficient documentation

## 2013-01-11 DIAGNOSIS — Z791 Long term (current) use of non-steroidal anti-inflammatories (NSAID): Secondary | ICD-10-CM | POA: Insufficient documentation

## 2013-01-11 DIAGNOSIS — Z7982 Long term (current) use of aspirin: Secondary | ICD-10-CM | POA: Insufficient documentation

## 2013-01-11 DIAGNOSIS — S0083XA Contusion of other part of head, initial encounter: Secondary | ICD-10-CM

## 2013-01-11 DIAGNOSIS — R42 Dizziness and giddiness: Secondary | ICD-10-CM

## 2013-01-11 DIAGNOSIS — Z87828 Personal history of other (healed) physical injury and trauma: Secondary | ICD-10-CM | POA: Insufficient documentation

## 2013-01-11 DIAGNOSIS — Z79899 Other long term (current) drug therapy: Secondary | ICD-10-CM | POA: Insufficient documentation

## 2013-01-11 DIAGNOSIS — G8911 Acute pain due to trauma: Secondary | ICD-10-CM | POA: Insufficient documentation

## 2013-01-11 DIAGNOSIS — Z8719 Personal history of other diseases of the digestive system: Secondary | ICD-10-CM | POA: Insufficient documentation

## 2013-01-11 DIAGNOSIS — S060X0D Concussion without loss of consciousness, subsequent encounter: Secondary | ICD-10-CM

## 2013-01-11 DIAGNOSIS — S0990XD Unspecified injury of head, subsequent encounter: Secondary | ICD-10-CM

## 2013-01-11 DIAGNOSIS — S0003XA Contusion of scalp, initial encounter: Secondary | ICD-10-CM

## 2013-01-11 DIAGNOSIS — R51 Headache: Secondary | ICD-10-CM | POA: Insufficient documentation

## 2013-01-11 LAB — POCT PREGNANCY, URINE: Preg Test, Ur: NEGATIVE

## 2013-01-11 LAB — POCT I-STAT, CHEM 8
BUN: 15 mg/dL (ref 6–23)
Chloride: 104 mEq/L (ref 96–112)
HCT: 43 % (ref 36.0–46.0)
Potassium: 4 mEq/L (ref 3.5–5.1)

## 2013-01-11 MED ORDER — HYDROCODONE-ACETAMINOPHEN 5-325 MG PO TABS
1.0000 | ORAL_TABLET | Freq: Once | ORAL | Status: DC
  Filled 2013-01-11: qty 1

## 2013-01-11 MED ORDER — IBUPROFEN 600 MG PO TABS: 600.0000 mg | ORAL_TABLET | Freq: Three times a day (TID) | ORAL | Status: DC

## 2013-01-11 MED ORDER — HYDROCODONE-ACETAMINOPHEN 5-325 MG PO TABS: 1.0000 | ORAL_TABLET | Freq: Four times a day (QID) | ORAL | Status: DC | PRN

## 2013-01-11 MED ORDER — MECLIZINE HCL 25 MG PO TABS: 25.0000 mg | ORAL_TABLET | Freq: Three times a day (TID) | ORAL | Status: DC | PRN

## 2013-01-11 NOTE — ED Notes (Signed)
Pt c/o dizziness starting upon waking this am; pt sts worse when laying flat and feels like room in spinning; pt sts right sided HA and numbness to head also
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
## 2013-01-13 ENCOUNTER — Encounter (HOSPITAL_COMMUNITY): Payer: Self-pay

## 2013-01-13 ENCOUNTER — Emergency Department (HOSPITAL_COMMUNITY)
Admission: EM | Admit: 2013-01-13 | Discharge: 2013-01-13 | Disposition: A | Discharge status: Home / Self Care | Payer: Medicaid Other | Attending: Emergency Medicine | Admitting: Emergency Medicine

## 2013-01-13 DIAGNOSIS — E039 Hypothyroidism, unspecified: Secondary | ICD-10-CM | POA: Insufficient documentation

## 2013-01-13 DIAGNOSIS — Z8719 Personal history of other diseases of the digestive system: Secondary | ICD-10-CM | POA: Insufficient documentation

## 2013-01-13 DIAGNOSIS — G8911 Acute pain due to trauma: Secondary | ICD-10-CM | POA: Insufficient documentation

## 2013-01-13 DIAGNOSIS — F29 Unspecified psychosis not due to a substance or known physiological condition: Secondary | ICD-10-CM | POA: Insufficient documentation

## 2013-01-13 DIAGNOSIS — R51 Headache: Secondary | ICD-10-CM | POA: Insufficient documentation

## 2013-01-13 DIAGNOSIS — M542 Cervicalgia: Secondary | ICD-10-CM | POA: Insufficient documentation

## 2013-01-13 DIAGNOSIS — Z79899 Other long term (current) drug therapy: Secondary | ICD-10-CM | POA: Insufficient documentation

## 2013-01-13 DIAGNOSIS — R509 Fever, unspecified: Secondary | ICD-10-CM | POA: Insufficient documentation

## 2013-01-13 DIAGNOSIS — Z7982 Long term (current) use of aspirin: Secondary | ICD-10-CM | POA: Insufficient documentation

## 2013-01-13 DIAGNOSIS — R42 Dizziness and giddiness: Secondary | ICD-10-CM | POA: Insufficient documentation

## 2013-01-13 MED ORDER — DIAZEPAM 5 MG PO TABS: 5.0000 mg | ORAL_TABLET | Freq: Two times a day (BID) | ORAL | Status: DC

## 2013-01-13 NOTE — ED Notes (Signed)
Pt c/o headache and dizziness x1 week, seen here Monday for the same, pt states her headache is ongoing with no relief. Pt reports she had a CT scan and everything was okay, states her dizziness has subsided
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
## 2013-01-15 NOTE — ED Provider Notes (Signed)
Medical screening examination/treatment/procedure(s) were conducted as a shared visit with non-physician practitioner(s) and myself.  I personally evaluated the patient during the encounter  Please see my separate respective documentation pertaining to this patient encounter   Vida Roller, MD 01/15/13 [phone]

## 2013-02-09 ENCOUNTER — Encounter (HOSPITAL_COMMUNITY): Payer: Self-pay | Admitting: Emergency Medicine

## 2013-02-09 ENCOUNTER — Emergency Department (HOSPITAL_COMMUNITY)
Admission: EM | Admit: 2013-02-09 | Discharge: 2013-02-09 | Disposition: A | Discharge status: Home / Self Care | Payer: Medicaid Other | Attending: Emergency Medicine | Admitting: Emergency Medicine

## 2013-02-09 ENCOUNTER — Emergency Department (HOSPITAL_COMMUNITY): Payer: Medicaid Other

## 2013-02-09 DIAGNOSIS — S46911A Strain of unspecified muscle, fascia and tendon at shoulder and upper arm level, right arm, initial encounter: Secondary | ICD-10-CM

## 2013-02-09 DIAGNOSIS — Z79899 Other long term (current) drug therapy: Secondary | ICD-10-CM | POA: Insufficient documentation

## 2013-02-09 DIAGNOSIS — Y929 Unspecified place or not applicable: Secondary | ICD-10-CM | POA: Insufficient documentation

## 2013-02-09 DIAGNOSIS — IMO0002 Reserved for concepts with insufficient information to code with codable children: Secondary | ICD-10-CM | POA: Insufficient documentation

## 2013-02-09 DIAGNOSIS — X500XXA Overexertion from strenuous movement or load, initial encounter: Secondary | ICD-10-CM | POA: Insufficient documentation

## 2013-02-09 DIAGNOSIS — Z7982 Long term (current) use of aspirin: Secondary | ICD-10-CM | POA: Insufficient documentation

## 2013-02-09 DIAGNOSIS — Y9389 Activity, other specified: Secondary | ICD-10-CM | POA: Insufficient documentation

## 2013-02-09 DIAGNOSIS — Z8719 Personal history of other diseases of the digestive system: Secondary | ICD-10-CM | POA: Insufficient documentation

## 2013-02-09 MED ORDER — KETOROLAC TROMETHAMINE 60 MG/2ML IM SOLN
60.0000 mg | Freq: Once | INTRAMUSCULAR | Status: AC
  Administered 2013-02-09: 60 mg via INTRAMUSCULAR
  Filled 2013-02-09: qty 2

## 2013-02-09 NOTE — ED Provider Notes (Signed)
CSN: [id number]     Arrival date & time 02/09/13  1845 History   First MD Initiated Contact with Patient 02/09/13 1847     Chief Complaint  Patient presents with  . Arm Pain   (Consider location/radiation/quality/duration/timing/severity/associated sxs/prior Treatment) HPI Comments: 37 year old female presents to the emergency department complaining of right shoulder pain after picking up a heavy box full of toys earlier this morning. Pain has been constant throughout the day, rated 10 out of 10, unrelieved by ibuprofen or icy hot, worse with movement. Denies numbness or tingling down her arm. No neck pain. No prior injury to her shoulder.  Patient is a 37 y.o. female presenting with arm pain. The history is provided by the patient.  Arm Pain Pertinent negatives include no chest pain, chills, fever, nausea, numbness or vomiting.    Past Medical History  Diagnosis Date  . Colitis    History reviewed. No pertinent past surgical history. History reviewed. No pertinent family history. History  Substance Use Topics  . Smoking status: Never Smoker   . Smokeless tobacco: Never Used  . Alcohol Use: No   OB History   Grav Para Term Preterm Abortions TAB SAB Ect Mult Living                 Review of Systems  Constitutional: Negative for fever and chills.  Respiratory: Negative for shortness of breath.   Cardiovascular: Negative for chest pain.  Gastrointestinal: Negative for nausea and vomiting.  Musculoskeletal:       Positive for right shoulder pain.  Skin: Negative for color change.  Neurological: Negative for numbness.    Allergies  Review of patient's allergies indicates no known allergies.  Home Medications   Current Outpatient Rx  Name  Route  Sig  Dispense  Refill  . aspirin EC 325 MG tablet   Oral   Take 1 tablet (325 mg total) by mouth daily.   30 tablet   0   . diazepam (VALIUM) 5 MG tablet   Oral   Take 1 tablet (5 mg total) by mouth 2 (two) times daily.  8 tablet   0   . HYDROcodone-acetaminophen (NORCO/VICODIN) 5-325 MG per tablet   Oral   Take 1 tablet by mouth every 6 (six) hours as needed for pain.   10 tablet   0   . ibuprofen (ADVIL,MOTRIN) 600 MG tablet   Oral   Take 1 tablet (600 mg total) by mouth 3 (three) times daily. Take with food.   30 tablet   0   . levothyroxine (SYNTHROID, LEVOTHROID) 100 MCG tablet   Oral   Take 100 mcg by mouth every morning.           LMP 01/27/2013 Physical Exam  Nursing note and vitals reviewed. Constitutional: She is oriented to person, place, and time. She appears well-developed and well-nourished. No distress.  HENT:  Head: Normocephalic and atraumatic.  Mouth/Throat: Oropharynx is clear and moist.  Eyes: Conjunctivae are normal.  Neck: Normal range of motion. Neck supple. No spinous process tenderness and no muscular tenderness present.  Cardiovascular: Normal rate, regular rhythm, normal heart sounds, intact distal pulses and normal pulses.   Pulses:      Radial pulses are 2+ on the right side.  Pulmonary/Chest: Effort normal and breath sounds normal.  Musculoskeletal: She exhibits no edema.  Tender to palpation of right shoulder girdle. No deformity, edema, bruising. Range of motion with abduction and flexion limited to 90 due to pain.  Neurological: She is alert and oriented to person, place, and time.  Skin: Skin is warm and dry. She is not diaphoretic.  Psychiatric: She has a normal mood and affect. Her behavior is normal.    ED Course  Procedures (including critical care time) Labs Review Labs Reviewed - No data to display Imaging Review Dg Shoulder Right  02/09/2013   *RADIOLOGY REPORT*  Clinical Data: Right shoulder pain during lifting injury today, initial encounter.  RIGHT SHOULDER - 2+ VIEW  Comparison: None.  Findings:  No fracture or dislocation.  Glenohumeral and acromioclavicular joint spaces appear preserved.  No evidence of calcific tendonitis. Limited  visualization of adjacent thorax is normal.  Regional soft tissues are normal.  IMPRESSION: No acute findings.   Original Report Authenticated By: Tacey Ruiz, MD    MDM   1. Shoulder strain, right, initial encounter    Patient with right shoulder strain. Pain improved with Toradol. Sling given for comfort. Advised rest, ice, NSAIDs. Patient states understanding of treatment care plan and is agreeable.     Trevor Mace, PA-C 02/09/13 2007

## 2013-02-09 NOTE — ED Notes (Signed)
Pt c/o r arm pain x1 day.  Reports injuring arm last night while trying to lift something.

## 2013-02-09 NOTE — ED Provider Notes (Signed)
Medical screening examination/treatment/procedure(s) were performed by non-physician practitioner and as supervising physician I was immediately available for consultation/collaboration.  Luma Clopper T Mervin Ramires, MD 02/09/13 2325 

## 2013-02-14 ENCOUNTER — Encounter (HOSPITAL_COMMUNITY): Payer: Self-pay | Admitting: Emergency Medicine

## 2013-02-14 ENCOUNTER — Emergency Department (HOSPITAL_COMMUNITY)
Admission: EM | Admit: 2013-02-14 | Discharge: 2013-02-15 | Disposition: A | Discharge status: Home / Self Care | Payer: Medicaid Other | Attending: Emergency Medicine | Admitting: Emergency Medicine

## 2013-02-14 DIAGNOSIS — M25519 Pain in unspecified shoulder: Secondary | ICD-10-CM | POA: Insufficient documentation

## 2013-02-14 DIAGNOSIS — R209 Unspecified disturbances of skin sensation: Secondary | ICD-10-CM | POA: Insufficient documentation

## 2013-02-14 DIAGNOSIS — Z8719 Personal history of other diseases of the digestive system: Secondary | ICD-10-CM | POA: Insufficient documentation

## 2013-02-14 DIAGNOSIS — G8911 Acute pain due to trauma: Secondary | ICD-10-CM | POA: Insufficient documentation

## 2013-02-14 DIAGNOSIS — Z7982 Long term (current) use of aspirin: Secondary | ICD-10-CM | POA: Insufficient documentation

## 2013-02-14 DIAGNOSIS — M25511 Pain in right shoulder: Secondary | ICD-10-CM

## 2013-02-14 DIAGNOSIS — Z79899 Other long term (current) drug therapy: Secondary | ICD-10-CM | POA: Insufficient documentation

## 2013-02-14 MED ORDER — DIAZEPAM 5 MG/ML IJ SOLN
5.0000 mg | Freq: Once | INTRAMUSCULAR | Status: AC
  Administered 2013-02-14: 5 mg via INTRAMUSCULAR
  Filled 2013-02-14: qty 2

## 2013-02-14 MED ORDER — DEXAMETHASONE SODIUM PHOSPHATE 10 MG/ML IJ SOLN: 10.0000 mg | Freq: Once | INTRAMUSCULAR | Status: DC

## 2013-02-14 MED ORDER — HYDROCODONE-ACETAMINOPHEN 5-325 MG PO TABS: 1.0000 | ORAL_TABLET | ORAL | Status: DC | PRN

## 2013-02-14 MED ORDER — DEXAMETHASONE SODIUM PHOSPHATE 10 MG/ML IJ SOLN
10.0000 mg | Freq: Once | INTRAMUSCULAR | Status: AC
  Administered 2013-02-14: 10 mg via INTRAMUSCULAR
  Filled 2013-02-14: qty 1

## 2013-02-14 MED ORDER — PREDNISONE 10 MG PO TABS: 20.0000 mg | ORAL_TABLET | Freq: Every day | ORAL | Status: DC

## 2013-02-14 MED ORDER — OXYCODONE-ACETAMINOPHEN 5-325 MG PO TABS
2.0000 | ORAL_TABLET | Freq: Once | ORAL | Status: AC
  Administered 2013-02-14: 2 via ORAL
  Filled 2013-02-14: qty 2

## 2013-02-14 MED ORDER — DIAZEPAM 5 MG PO TABS: 5.0000 mg | ORAL_TABLET | Freq: Four times a day (QID) | ORAL | Status: DC | PRN

## 2013-02-14 NOTE — ED Notes (Signed)
Pt c/o R shoulder pain, pt seen here for same last week. Pt states pain no better at this time, pt states she has been unable to f/u with ortho

## 2013-02-14 NOTE — ED Provider Notes (Signed)
CSN: [id number]     Arrival date & time 02/14/13  2224 History   None     This chart was scribed for Wandalee Ferdinand by Ladona Ridgel Day, ED scribe. This patient was seen in room WTR6/WTR6 and the patient's care was started at 2224.  Chief Complaint  Patient presents with  . Shoulder Pain   The history is provided by the patient. No language interpreter was used.   HPI Comments: Carol Welch is a 37 y.o. female who presents to the Emergency Department complaining of constant, gradually worsened right shoulder pain. She presents with a sling to her right arm for recent shoulder injury last week and she states that she is here b/c the pain has worsened and not improved. She states that she has decreased ROM to her right shoulder secondary to pain. She states occasional numbness of her right arm and associated swelling. She has been using NSAIDS and applying ice and heart, and resting her shoulder w/out improvement in her shoulder pain. She tried to f/u w/her PCP, but could not get an appt. She states occasional subjective fevers but denies chills, or sweats. She states associated stiffness on right side of her neck. She states no med hx.   Past Medical History  Diagnosis Date  . Colitis    History reviewed. No pertinent past surgical history. No family history on file. History  Substance Use Topics  . Smoking status: Never Smoker   . Smokeless tobacco: Never Used  . Alcohol Use: No   OB History   Grav Para Term Preterm Abortions TAB SAB Ect Mult Living                 Review of Systems  Musculoskeletal: Positive for joint swelling (right shoulder pain, swelling).  All other systems reviewed and are negative.   A complete 10 system review of systems was obtained and all systems are negative except as noted in the HPI and PMH.   Allergies  Review of patient's allergies indicates no known allergies.  Home Medications   Current Outpatient Rx  Name  Route  Sig  Dispense  Refill  .  aspirin EC 325 MG tablet   Oral   Take 1 tablet (325 mg total) by mouth daily.   30 tablet   0   . ibuprofen (ADVIL,MOTRIN) 600 MG tablet   Oral   Take 600 mg by mouth every 8 (eight) hours as needed for pain.         Marland Kitchen levothyroxine (SYNTHROID, LEVOTHROID) 100 MCG tablet   Oral   Take 100 mcg by mouth daily before breakfast.           Triage Vitals: BP 111/62  Pulse 67  Temp(Src) 98 F (36.7 C) (Oral)  Resp 16  Ht 5\' 2"  (1.575 m)  Wt 193 lb (87.544 kg)  BMI 35.29 kg/m2  SpO2 100%  LMP 01/27/2013 Physical Exam  Nursing note and vitals reviewed. Constitutional: She is oriented to person, place, and time. She appears well-developed and well-nourished. No distress.  HENT:  Head: Normocephalic and atraumatic.  Eyes: EOM are normal.  Neck: Normal range of motion. Neck supple. No tracheal deviation present.  No cervical midline tenderness  Cardiovascular: Normal rate.   Good distal pulses and good capillary refill  Pulmonary/Chest: Effort normal. No respiratory distress.  Musculoskeletal: Normal range of motion. She exhibits tenderness.  Reduce ROM of right shoulder secondary to pain. Skin is normal without erythema. No significant swelling. Diffuse tenderness over  the a.c. joint and anterior lateral shoulder. No gross deformities. Normal ROMI of fingers and right wrist. Normal distal pulses and sensations.   Neurological: She is alert and oriented to person, place, and time.  Normal sensation light touch  Skin: Skin is warm and dry.  Psychiatric: She has a normal mood and affect. Her behavior is normal.    ED Course  Procedures DIAGNOSTIC STUDIES: Oxygen Saturation is 100% on room air, normal by my interpretation.    COORDINATION OF CARE: At 1120 PM patient seen and evaluated. Patient appears uncomfortable. Right arm is in sling. Pain has been continuous unchanged from previous visit. Previous x-rays reviewed by myself without signs of dislocation or fracture.  Discussed new treatment plan with patient which includes pain medicine, steroid and muscle relaxer as well as stronger pain medication. Patient agrees. Patient strongly encouraged to followup with PCP or orthopedic specialist.    MDM   1. Shoulder pain, acute, right       I personally performed the services described in this documentation, which was scribed in my presence. The recorded information has been reviewed and is accurate.      Angus Seller, PA-C 02/14/13 2336

## 2013-02-15 NOTE — ED Provider Notes (Signed)
Medical screening examination/treatment/procedure(s) were performed by non-physician practitioner and as supervising physician I was immediately available for consultation/collaboration.   Loren Racer, MD 02/15/13 [phone]

## 2013-05-18 ENCOUNTER — Emergency Department (HOSPITAL_COMMUNITY)
Admission: EM | Admit: 2013-05-18 | Discharge: 2013-05-18 | Disposition: A | Discharge status: Home / Self Care | Payer: Medicaid Other | Attending: Emergency Medicine | Admitting: Emergency Medicine

## 2013-05-18 ENCOUNTER — Emergency Department (HOSPITAL_COMMUNITY): Payer: Medicaid Other

## 2013-05-18 ENCOUNTER — Encounter (HOSPITAL_COMMUNITY): Payer: Self-pay | Admitting: Emergency Medicine

## 2013-05-18 DIAGNOSIS — R079 Chest pain, unspecified: Secondary | ICD-10-CM | POA: Insufficient documentation

## 2013-05-18 DIAGNOSIS — Z7982 Long term (current) use of aspirin: Secondary | ICD-10-CM | POA: Insufficient documentation

## 2013-05-18 DIAGNOSIS — Z8719 Personal history of other diseases of the digestive system: Secondary | ICD-10-CM | POA: Insufficient documentation

## 2013-05-18 DIAGNOSIS — R110 Nausea: Secondary | ICD-10-CM | POA: Insufficient documentation

## 2013-05-18 DIAGNOSIS — Z79899 Other long term (current) drug therapy: Secondary | ICD-10-CM | POA: Insufficient documentation

## 2013-05-18 LAB — COMPREHENSIVE METABOLIC PANEL
ALT: 13 U/L (ref 0–35)
AST: 17 U/L (ref 0–37)
Albumin: 3.6 g/dL (ref 3.5–5.2)
Alkaline Phosphatase: 58 U/L (ref 39–117)
BUN: 12 mg/dL (ref 6–23)
CO2: 26 mEq/L (ref 19–32)
Calcium: 8.4 mg/dL (ref 8.4–10.5)
Chloride: 104 mEq/L (ref 96–112)
Creatinine, Ser: 0.58 mg/dL (ref 0.50–1.10)
GFR calc Af Amer: >90 mL/min (ref >90)
GFR calc non Af Amer: >90 mL/min (ref >90)
Glucose, Bld: 98 mg/dL (ref 70–99)
Potassium: 4.1 mEq/L (ref 3.7–5.3)
Sodium: 140 mEq/L (ref 137–147)
Total Bilirubin: <0.2 mg/dL — ABNORMAL LOW (ref 0.3–1.2)
Total Protein: 7.3 g/dL (ref 6.0–8.3)

## 2013-05-18 LAB — CBC WITH DIFFERENTIAL/PLATELET
Basophils Absolute: 0 10*3/uL (ref 0.0–0.1)
Basophils Relative: 0 % (ref 0–1)
Eosinophils Absolute: 0.1 10*3/uL (ref 0.0–0.7)
Eosinophils Relative: 2 % (ref 0–5)
HCT: 38.4 % (ref 36.0–46.0)
Hemoglobin: 13.1 g/dL (ref 12.0–15.0)
Lymphocytes Relative: 39 % (ref 12–46)
Lymphs Abs: 2.8 10*3/uL (ref 0.7–4.0)
MCH: 29 pg (ref 26.0–34.0)
MCHC: 34.1 g/dL (ref 30.0–36.0)
MCV: 85.1 fL (ref 78.0–100.0)
Monocytes Absolute: 0.5 10*3/uL (ref 0.1–1.0)
Monocytes Relative: 6 % (ref 3–12)
Neutro Abs: 3.8 10*3/uL (ref 1.7–7.7)
Neutrophils Relative %: 53 % (ref 43–77)
Platelets: 257 10*3/uL (ref 150–400)
RBC: 4.51 MIL/uL (ref 3.87–5.11)
RDW: 13.9 % (ref 11.5–15.5)
WBC: 7.2 10*3/uL (ref 4.0–10.5)

## 2013-05-18 LAB — POCT I-STAT TROPONIN I: Troponin i, poc: 0 ng/mL (ref 0.00–0.08)

## 2013-05-18 LAB — POCT PREGNANCY, URINE: Preg Test, Ur: NEGATIVE

## 2013-05-18 MED ORDER — ONDANSETRON 4 MG PO TBDP: 4.0000 mg | ORAL_TABLET | Freq: Three times a day (TID) | ORAL | Status: DC | PRN

## 2013-05-18 MED ORDER — ONDANSETRON 4 MG PO TBDP
4.0000 mg | ORAL_TABLET | Freq: Once | ORAL | Status: AC
  Administered 2013-05-18: 4 mg via ORAL
  Filled 2013-05-18: qty 1

## 2013-05-18 NOTE — ED Notes (Signed)
PA at bedside.

## 2013-05-18 NOTE — ED Notes (Signed)
Pt given water for PO challenge 

## 2013-05-18 NOTE — ED Notes (Signed)
Pt c/o pain under left breast radiating into left chest, onset this am with nausea and feeling lightheaded.  Pt st's pain has continued through the day.  Pt denies cough but st's deep breaths increases the pain

## 2013-05-18 NOTE — Discharge Instructions (Signed)
Take the prescribed medication as directed for nausea. Follow-up with your primary care physician as needed. Return to the ED for new or worsening symptoms-- worsening pain, shortness of breath, palpitations, dizziness, weakness, diaphoresis, etc.

## 2013-05-18 NOTE — ED Provider Notes (Signed)
CSN: [id number]     Arrival date & time 05/18/13  1723 History   First MD Initiated Contact with Patient 05/18/13 1912     Chief Complaint  Patient presents with  . Chest Pain   (Consider location/radiation/quality/duration/timing/severity/associated sxs/prior Treatment) The history is provided by the patient and medical records.   This is a 38 year old female presenting to the ED for chest pain, onset this morning. Pain described as a sharp, stabbing sensation directly under her left breast associated with nausea and intermittent lightheadedness throughout the day today. No associated SOB, palpitations, radiation of pain into extremities or neck, dizziness, diaphoresis, or vomiting.  Deep breathing and movement seem to exacerbate pain, no alleviating factors identified. No recent illness, cough, chest congestion, fevers, etc.  Patient denies any recent travel, surgeries, LE edema, calf pain. No prior hx of DVT or PE.  No exogenous estrogens.  Pt is not and has never been a smoker.  No prior cardiac hx, does have family hx of MI.  Pt takes daily ASA.  Past Medical History  Diagnosis Date  . Colitis    History reviewed. No pertinent past surgical history. No family history on file. History  Substance Use Topics  . Smoking status: Never Smoker   . Smokeless tobacco: Never Used  . Alcohol Use: No   OB History   Grav Para Term Preterm Abortions TAB SAB Ect Mult Living                 Review of Systems  Cardiovascular: Positive for chest pain.  Gastrointestinal: Positive for nausea.  Neurological: Positive for light-headedness.  All other systems reviewed and are negative.    Allergies  Review of patient's allergies indicates no known allergies.  Home Medications   Current Outpatient Rx  Name  Route  Sig  Dispense  Refill  . aspirin EC 325 MG tablet   Oral   Take 1 tablet (325 mg total) by mouth daily.   30 tablet   0   . diazepam (VALIUM) 5 MG tablet   Oral   Take 1  tablet (5 mg total) by mouth every 6 (six) hours as needed for anxiety (spasms).   10 tablet   0   . HYDROcodone-acetaminophen (NORCO/VICODIN) 5-325 MG per tablet   Oral   Take 1-2 tablets by mouth every 4 (four) hours as needed for pain.   20 tablet   0   . ibuprofen (ADVIL,MOTRIN) 600 MG tablet   Oral   Take 600 mg by mouth every 8 (eight) hours as needed for pain.         Marland Kitchen. levothyroxine (SYNTHROID, LEVOTHROID) 100 MCG tablet   Oral   Take 100 mcg by mouth daily before breakfast.          . predniSONE (DELTASONE) 10 MG tablet   Oral   Take 2 tablets (20 mg total) by mouth daily.   15 tablet   0    BP 102/64  Pulse 64  Temp(Src) 98 F (36.7 C) (Oral)  Resp 14  Ht 5\' 2"  (1.575 m)  Wt 195 lb 3 oz (88.536 kg)  BMI 35.69 kg/m2  SpO2 100%  LMP 05/11/2013  Physical Exam  Nursing note and vitals reviewed. Constitutional: She is oriented to person, place, and time. She appears well-developed and well-nourished. No distress.  HENT:  Head: Normocephalic and atraumatic.  Mouth/Throat: Oropharynx is clear and moist.  Eyes: Conjunctivae and EOM are normal. Pupils are equal, round, and reactive to  light.  Neck: Normal range of motion. Neck supple.  Cardiovascular: Normal rate, regular rhythm and normal heart sounds.   Pulmonary/Chest: Effort normal and breath sounds normal. No respiratory distress. She has no wheezes.  Abdominal: Soft. Bowel sounds are normal. There is no tenderness. There is no guarding.  Musculoskeletal: Normal range of motion. She exhibits no edema.  Neurological: She is alert and oriented to person, place, and time.  Skin: Skin is warm and dry. She is not diaphoretic.  Psychiatric: She has a normal mood and affect.    ED Course  Procedures (including critical care time) Labs Review Labs Reviewed  COMPREHENSIVE METABOLIC PANEL - Abnormal; Notable for the following:    Total Bilirubin <0.2 (*)    All other components within normal limits  CBC  WITH DIFFERENTIAL  POCT PREGNANCY, URINE  POCT I-STAT TROPONIN I   Imaging Review Dg Chest 2 View  05/18/2013   CLINICAL DATA:  Chest/back pain, lightheaded  EXAM: CHEST  2 VIEW  COMPARISON:  10/26/2012  FINDINGS: Lungs are clear. No pleural effusion or pneumothorax.  The heart is normal in size.  Visualized osseous structures are within normal limits.  IMPRESSION: Normal chest radiographs.   Electronically Signed   By: Charline Bills M.D.   On: 05/18/2013 19:02    EKG Interpretation    Date/Time:  Tuesday May 18 2013 17:55:28 EST Ventricular Rate:  66 PR Interval:  136 QRS Duration: 82 QT Interval:  402 QTC Calculation: 421 R Axis:   58 Text Interpretation:  Normal sinus rhythm ABNORMAL R WAVE PROGRESSION No significant change since last tracing 6/14 Confirmed by KOHUT  MD, STEPHEN ([id number]) on 05/18/2013 6:12:02 PM            MDM   1. Chest pain   2. Nausea     EKG NSR, no acute ischemic changes.  Trop negative.  Labs reassuring.  CXR clear.  At this time i have low suspicion for ACS, PE, dissection, or other acute cardiac event as pt is PERC negative, no new murmurs, heart score of 0.  Feel that pt is appropriate for discharge with PCP FU.  Rx zofran.  Plan of care discussed with pt, she acknowledged understanding and agreed.  Strict return precautions advised for new or worsening sx.  Garlon Hatchet, PA-C 05/18/13 2341

## 2013-05-22 NOTE — ED Provider Notes (Signed)
Medical screening examination/treatment/procedure(s) were performed by non-physician practitioner and as supervising physician I was immediately available for consultation/collaboration.  EKG Interpretation    Date/Time:  Tuesday May 18 2013 17:55:28 EST Ventricular Rate:  66 PR Interval:  136 QRS Duration: 82 QT Interval:  402 QTC Calculation: 421 R Axis:   58 Text Interpretation:  Normal sinus rhythm ABNORMAL R WAVE PROGRESSION No significant change since last tracing 6/14 Confirmed by Juleen ChinaKOHUT  MD, Camari Quintanilla ([id number]) on 05/18/2013 6:12:02 PM             Raeford RazorStephen Mayanna Garlitz, MD 05/22/13 2340

## 2013-06-21 ENCOUNTER — Emergency Department (HOSPITAL_COMMUNITY)
Admission: EM | Admit: 2013-06-21 | Discharge: 2013-06-21 | Disposition: A | Discharge status: Home / Self Care | Payer: Medicaid Other | Attending: Emergency Medicine | Admitting: Emergency Medicine

## 2013-06-21 ENCOUNTER — Emergency Department (HOSPITAL_COMMUNITY): Payer: Medicaid Other

## 2013-06-21 ENCOUNTER — Encounter (HOSPITAL_COMMUNITY): Payer: Self-pay | Admitting: Emergency Medicine

## 2013-06-21 DIAGNOSIS — N730 Acute parametritis and pelvic cellulitis: Secondary | ICD-10-CM | POA: Insufficient documentation

## 2013-06-21 DIAGNOSIS — N83201 Unspecified ovarian cyst, right side: Secondary | ICD-10-CM

## 2013-06-21 DIAGNOSIS — Z791 Long term (current) use of non-steroidal anti-inflammatories (NSAID): Secondary | ICD-10-CM | POA: Insufficient documentation

## 2013-06-21 DIAGNOSIS — Z8719 Personal history of other diseases of the digestive system: Secondary | ICD-10-CM | POA: Insufficient documentation

## 2013-06-21 DIAGNOSIS — Z79899 Other long term (current) drug therapy: Secondary | ICD-10-CM | POA: Insufficient documentation

## 2013-06-21 DIAGNOSIS — N83209 Unspecified ovarian cyst, unspecified side: Secondary | ICD-10-CM | POA: Insufficient documentation

## 2013-06-21 DIAGNOSIS — R42 Dizziness and giddiness: Secondary | ICD-10-CM | POA: Insufficient documentation

## 2013-06-21 DIAGNOSIS — Z7982 Long term (current) use of aspirin: Secondary | ICD-10-CM | POA: Insufficient documentation

## 2013-06-21 DIAGNOSIS — Z3202 Encounter for pregnancy test, result negative: Secondary | ICD-10-CM | POA: Insufficient documentation

## 2013-06-21 LAB — URINE MICROSCOPIC-ADD ON

## 2013-06-21 LAB — CBC WITH DIFFERENTIAL/PLATELET
BASOS ABS: 0 10*3/uL (ref 0.0–0.1)
Basophils Relative: 1 % (ref 0–1)
Eosinophils Absolute: 0.1 10*3/uL (ref 0.0–0.7)
Eosinophils Relative: 2 % (ref 0–5)
HEMATOCRIT: 37.4 % (ref 36.0–46.0)
HEMOGLOBIN: 12.6 g/dL (ref 12.0–15.0)
Lymphocytes Relative: 44 % (ref 12–46)
Lymphs Abs: 2.9 10*3/uL (ref 0.7–4.0)
MCH: 28.6 pg (ref 26.0–34.0)
MCHC: 33.7 g/dL (ref 30.0–36.0)
MCV: 85 fL (ref 78.0–100.0)
MONOS PCT: 7 % (ref 3–12)
Monocytes Absolute: 0.4 10*3/uL (ref 0.1–1.0)
NEUTROS ABS: 3.1 10*3/uL (ref 1.7–7.7)
Neutrophils Relative %: 47 % (ref 43–77)
Platelets: 276 10*3/uL (ref 150–400)
RBC: 4.4 MIL/uL (ref 3.87–5.11)
RDW: 13.8 % (ref 11.5–15.5)
WBC: 6.6 10*3/uL (ref 4.0–10.5)

## 2013-06-21 LAB — WET PREP, GENITAL
Trich, Wet Prep: NONE SEEN
Yeast Wet Prep HPF POC: NONE SEEN

## 2013-06-21 LAB — COMPREHENSIVE METABOLIC PANEL
ALT: 13 U/L (ref 0–35)
AST: 18 U/L (ref 0–37)
Albumin: 3.9 g/dL (ref 3.5–5.2)
Alkaline Phosphatase: 57 U/L (ref 39–117)
BUN: 18 mg/dL (ref 6–23)
CHLORIDE: 103 meq/L (ref 96–112)
CO2: 25 mEq/L (ref 19–32)
Calcium: 9.1 mg/dL (ref 8.4–10.5)
Creatinine, Ser: 0.61 mg/dL (ref 0.50–1.10)
GFR calc non Af Amer: >90 mL/min (ref >90)
Glucose, Bld: 97 mg/dL (ref 70–99)
Potassium: 3.6 mEq/L — ABNORMAL LOW (ref 3.7–5.3)
Sodium: 141 mEq/L (ref 137–147)
Total Bilirubin: <0.2 mg/dL — ABNORMAL LOW (ref 0.3–1.2)
Total Protein: 7.7 g/dL (ref 6.0–8.3)

## 2013-06-21 LAB — URINALYSIS, ROUTINE W REFLEX MICROSCOPIC
BILIRUBIN URINE: NEGATIVE
Glucose, UA: NEGATIVE mg/dL
HGB URINE DIPSTICK: NEGATIVE
Ketones, ur: NEGATIVE mg/dL
Nitrite: NEGATIVE
PROTEIN: NEGATIVE mg/dL
Specific Gravity, Urine: 1.014 (ref 1.005–1.030)
UROBILINOGEN UA: 0.2 mg/dL (ref 0.0–1.0)
pH: 6.5 (ref 5.0–8.0)

## 2013-06-21 LAB — POCT PREGNANCY, URINE: PREG TEST UR: NEGATIVE

## 2013-06-21 LAB — TROPONIN I

## 2013-06-21 LAB — LIPASE, BLOOD: Lipase: 30 U/L (ref 11–59)

## 2013-06-21 MED ORDER — ONDANSETRON HCL 4 MG/2ML IJ SOLN
4.0000 mg | Freq: Once | INTRAMUSCULAR | Status: AC
  Administered 2013-06-21: 4 mg via INTRAVENOUS
  Filled 2013-06-21: qty 2

## 2013-06-21 MED ORDER — DOXYCYCLINE HYCLATE 100 MG PO CAPS: 100.0000 mg | ORAL_CAPSULE | Freq: Two times a day (BID) | ORAL | Status: DC

## 2013-06-21 MED ORDER — SODIUM CHLORIDE 0.9 % IV BOLUS (SEPSIS)
1000.0000 mL | Freq: Once | INTRAVENOUS | Status: AC
  Administered 2013-06-21: 1000 mL via INTRAVENOUS

## 2013-06-21 MED ORDER — HYDROMORPHONE HCL PF 1 MG/ML IJ SOLN
0.5000 mg | Freq: Once | INTRAMUSCULAR | Status: AC
  Administered 2013-06-21: 0.5 mg via INTRAVENOUS
  Filled 2013-06-21: qty 1

## 2013-06-21 MED ORDER — NAPROXEN 500 MG PO TABS: 500.0000 mg | ORAL_TABLET | Freq: Two times a day (BID) | ORAL | Status: DC

## 2013-06-21 NOTE — ED Notes (Signed)
Initial Contact - pt to 22, changed to hospital gown, pt reports c/o L sided abd pain since yesterday.  7/10.  +nausea.  Pt denies other complaints.  Skin PWD.  MAEI.  No active vomiting.  Speaking full/clear sentences.  NAD.  Awaiting EDP eval.

## 2013-06-21 NOTE — Discharge Instructions (Signed)
Please call your doctor for a followup appointment within 24-48 hours. When you talk to your doctor please let them know that you were seen in the emergency department and have them acquire all of your records so that they can discuss the findings with you and formulate a treatment plan to fully care for your new and ongoing problems. Please call and setup an appointment with your primary care provider to be reassessed within the next 24-48 hours Please call and set up an appointment with women's outpatient clinic to be reassessed regarding infection Please take antibiotics as prescribed-doxycycline Please take naproxen as prescribed for ovarian cyst Please avoid any physical or strenuous activity Please avoid any sexual activity until infection has been cleared Please rest and stay hydrated Recommended to followup with STD clinic for further analysis to be performed Please continue to monitor symptoms closely and if symptoms are to worsen or change (fever greater than 101, chills, neck pain, neck stiffness, shortness of breath, difficulty breathing, nausea, vomiting, diarrhea, blood in the stools, black for stools, vaginal discharge, vaginal pain, worsening pain) please report back to emergency department immediately   Ovarian Cyst An ovarian cyst is a fluid-filled sac that forms on an ovary. The ovaries are small organs that produce eggs in women. Various types of cysts can form on the ovaries. Most are not cancerous. Many do not cause problems, and they often go away on their own. Some may cause symptoms and require treatment. Common types of ovarian cysts include:  Functional cysts These cysts may occur every month during the menstrual cycle. This is normal. The cysts usually go away with the next menstrual cycle if the woman does not get pregnant. Usually, there are no symptoms with a functional cyst.  Endometrioma cysts These cysts form from the tissue that lines the uterus. They are also  called "chocolate cysts" because they become filled with blood that turns brown. This type of cyst can cause pain in the lower abdomen during intercourse and with your menstrual period.  Cystadenoma cysts This type develops from the cells on the outside of the ovary. These cysts can get very big and cause lower abdomen pain and pain with intercourse. This type of cyst can twist on itself, cut off its blood supply, and cause severe pain. It can also easily rupture and cause a lot of pain.  Dermoid cysts This type of cyst is sometimes found in both ovaries. These cysts may contain different kinds of body tissue, such as skin, teeth, hair, or cartilage. They usually do not cause symptoms unless they get very big.  Theca lutein cysts These cysts occur when too much of a certain hormone (human chorionic gonadotropin) is produced and overstimulates the ovaries to produce an egg. This is most common after procedures used to assist with the conception of a baby (in vitro fertilization). CAUSES   Fertility drugs can cause a condition in which multiple large cysts are formed on the ovaries. This is called ovarian hyperstimulation syndrome.  A condition called polycystic ovary syndrome can cause hormonal imbalances that can lead to nonfunctional ovarian cysts. SIGNS AND SYMPTOMS  Many ovarian cysts do not cause symptoms. If symptoms are present, they may include:  Pelvic pain or pressure.  Pain in the lower abdomen.  Pain during sexual intercourse.  Increasing girth (swelling) of the abdomen.  Abnormal menstrual periods.  Increasing pain with menstrual periods.  Stopping having menstrual periods without being pregnant. DIAGNOSIS  These cysts are commonly found during  a routine or annual pelvic exam. Tests may be ordered to find out more about the cyst. These tests may include:  Ultrasound.  X-ray of the pelvis.  CT scan.  MRI.  Blood tests. TREATMENT  Many ovarian cysts go away on their  own without treatment. Your health care provider may want to check your cyst regularly for 2 3 months to see if it changes. For women in menopause, it is particularly important to monitor a cyst closely because of the higher rate of ovarian cancer in menopausal women. When treatment is needed, it may include any of the following:  A procedure to drain the cyst (aspiration). This may be done using a long needle and ultrasound. It can also be done through a laparoscopic procedure. This involves using a thin, lighted tube with a tiny camera on the end (laparoscope) inserted through a small incision.  Surgery to remove the whole cyst. This may be done using laparoscopic surgery or an open surgery involving a larger incision in the lower abdomen.  Hormone treatment or birth control pills. These methods are sometimes used to help dissolve a cyst. HOME CARE INSTRUCTIONS   Only take over-the-counter or prescription medicines as directed by your health care provider.  Follow up with your health care provider as directed.  Get regular pelvic exams and Pap tests. SEEK MEDICAL CARE IF:   Your periods are late, irregular, or painful, or they stop.  Your pelvic pain or abdominal pain does not go away.  Your abdomen becomes larger or swollen.  You have pressure on your bladder or trouble emptying your bladder completely.  You have pain during sexual intercourse.  You have feelings of fullness, pressure, or discomfort in your stomach.  You lose weight for no apparent reason.  You feel generally ill.  You become constipated.  You lose your appetite.  You develop acne.  You have an increase in body and facial hair.  You are gaining weight, without changing your exercise and eating habits.  You think you are pregnant. SEEK IMMEDIATE MEDICAL CARE IF:   You have increasing abdominal pain.  You feel sick to your stomach (nauseous), and you throw up (vomit).  You develop a fever that comes  on suddenly.  You have abdominal pain during a bowel movement.  Your menstrual periods become heavier than usual. Document Released: 04/22/2005 Document Revised: 02/10/2013 Document Reviewed: 12/28/2012 [facility] Patient Information 2014 [place], [place].  Pelvic Inflammatory Disease Pelvic inflammatory disease (PID) refers to an infection in some or all of the female organs. The infection can be in the uterus, ovaries, fallopian tubes, or the surrounding tissues in the pelvis. PID can cause abdominal or pelvic pain that comes on suddenly (acute pelvic pain). PID is a serious infection because it can lead to lasting (chronic) pelvic pain or the inability to have children (infertile).  CAUSES  The infection is often caused by the normal bacteria found in the vaginal tissues. PID may also be caused by an infection that is spread during sexual contact. PID can also occur following:   The birth of a baby.   A miscarriage.   An abortion.   Major pelvic surgery.   The use of an intrauterine device (IUD).   A sexual assault.  RISK FACTORS Certain factors can put a person at higher risk for PID, such as:  Being younger than 25 years.  Being sexually active at [place] age.  Usingnonbarrier contraception.  Havingmultiple sexual partners.  Having sex with someone  who has symptoms of a genital infection.  Using oral contraception. Other times, certain behaviors can increase the possibility of getting PID, such as:  Having sex during your period.  Using a vaginal douche.  Having an intrauterine device (IUD) in place. SYMPTOMS   Abdominal or pelvic pain.   Fever.   Chills.   Abnormal vaginal discharge.  Abnormal uterine bleeding.   Unusual pain shortly after finishing your period. DIAGNOSIS  Your caregiver will choose some of the following methods to make a diagnosis, such as:   Performinga physical exam and history. A pelvic exam typically reveals a very  tender uterus and surrounding pelvis.   Ordering laboratory tests including a pregnancy test, blood tests, and urine test.  Orderingcultures of the vagina and cervix to check for a sexually transmitted infection (STI).  Performing an ultrasound.   Performing a laparoscopic procedure to look inside the pelvis.  TREATMENT   Antibiotic medicines may be prescribed and taken by mouth.   Sexual partners may be treated when the infection is caused by a sexually transmitted disease (STD).   Hospitalization may be needed to give antibiotics intravenously.  Surgery may be needed, but this is rare. It may take weeks until you are completely well. If you are diagnosed with PID, you should also be checked for human immunodeficiency virus (HIV). HOME CARE INSTRUCTIONS   If given, take your antibiotics as directed. Finish the medicine even if you start to feel better.   Only take over-the-counter or prescription medicines for pain, discomfort, or fever as directed by your caregiver.   Do not have sexual intercourse until treatment is completed or as directed by your caregiver. If PID is confirmed, your recent sexual partner(s) will need treatment.   Keep your follow-up appointments. SEEK MEDICAL CARE IF:   You have increased or abnormal vaginal discharge.   You need prescription medicine for your pain.   You vomit.   You cannot take your medicines.   Your partner has an STD.  SEEK IMMEDIATE MEDICAL CARE IF:   You have a fever.   You have increased abdominal or pelvic pain.   You have chills.   You have pain when you urinate.   You are not better after 72 hours following treatment.  MAKE SURE YOU:   Understand these instructions.  Will watch your condition.  Will get help right away if you are not doing well or get worse. Document Released: 04/22/2005 Document Revised: 08/17/2012 Document Reviewed: 04/18/2011 [facility] Patient Information 2014  [place].   Emergency Department Resource Guide 1) Find a Doctor and Pay Out of Pocket Although you won't have to find out who is covered by your insurance plan, it is a good idea to ask around and get recommendations. You will then need to call the office and see if the doctor you have chosen will accept you as a new patient and what types of options they offer for patients who are self-pay. Some doctors offer discounts or will set up payment plans for their patients who do not have insurance, but you will need to ask so you aren't surprised when you get to your appointment.  2) Contact Your Local Health Department Not all health departments have doctors that can see patients for sick visits, but many do, so it is worth a call to see if yours does. If you don't know where your local health department is, you can check in your phone book. The CDC also has a tool  to help you locate your state's health department, and many state websites also have listings of all of their local health departments.  3) Find a Walk-in Clinic If your illness is not likely to be very severe or complicated, you may want to try a walk in clinic. These are popping up all over the country in pharmacies, drugstores, and shopping centers. They're usually staffed by nurse practitioners or physician assistants that have been trained to treat common illnesses and complaints. They're usually fairly quick and inexpensive. However, if you have serious medical issues or chronic medical problems, these are probably not your best option.  No Primary Care Doctor: - Call Health Connect at  [phone] - they can help you locate a primary care doctor that  accepts your insurance, provides certain services, etc. - Physician Referral Service- [phone]  Chronic Pain Problems: Organization         Address  Phone   Notes  [facility]  [phone] Patients need to be referred by their primary care doctor.    Medication Assistance: Organization         Address  Phone   Notes  [facility] Medication [facility] [address] [place] [place], [place] [address] [phone] --Must be a resident of [facility] -- Must have NO insurance coverage whatsoever (no Medicaid/ Medicare, etc.) -- The pt. MUST have a primary care doctor that directs their care regularly and follows them in the community   Med[facility]ist  [phone]   [employer]  [phone]    Agencies that provide inexpensive medical care: Organization         Address  Phone   Notes  [facility]  [phone]   [facility]    [phone]   [facility] [address] [place], [place] [address] [phone]   [facility] [address] [place] [place] [phone]   Planned Parenthood    [phone]   [facility]    [phone]   [facility]  [address], [place] Phone:  [phone], Fax:  [phone] Hours of Operation:  9 am - 6 pm, M-F.  Also accepts Medicaid/Medicare and self-pay.  [facility]  [address], [place] Phone: [phone], Fax: [phone]. Hours of Operation:  8:30 am - 5:30 pm, M-F.  Also accepts Medicaid and self-pay.  [facility] [place] [address], [place] Phone: [phone]   [facility] [address] Natasha Bence [place], [place] [phone], Ext. [phone] Mondays & Thursdays: 7-9 AM.  First 15 patients are seen on a first come, first serve basis.    Medicaid-accepting [facility] Providers:  Organization         Address  Phone   Notes  [facility] [address], [place] [phone] Also accepts self-pay patients.  [facility] [address] Laurell Josephs [place], [place]  [phone]   [facility] [address], [place] [phone]   [facility] Family Medicine [address], [place] [phone]   Renaye Rakers [address], [place]   [phone] Only accepts [place] Access [place] patients after they have their name applied to their card.   Self-Pay (no insurance) in [place]:  Organization         Address  Phone   Notes  Sickle Cell Patients, [facility] Internal Medicine [address] [employer], [place] [phone]   [facility] [address] [place], [place] [phone]   [facility] [place]  [address], [place] [phone]   [facility]/Dr. Osei-Bonsu  [address], [place] or [address], [place] [phone] Phone number for both [place] and [place] locations is the same.  [facility] [address], [place] [phone]   [facility] [address] [place] or [address] [phone]   [facility] [address] [place] [phone], phone; [phone], fax Sees patients 1st and 3rd Saturday of every month.  Must not qualify for public or private insurance (i.e. Medicaid, Medicare, [place] Health Choice, Veterans' Benefits)  Household income should be no more than 200% of the poverty level The clinic cannot treat you if you are pregnant or think you are pregnant  Sexually transmitted diseases are not treated at the clinic.    Dental Care: Organization         Address  Phone  Notes  [facility] of [facility] [address] [place], [place] [phone] Accepts children up to age 31 who are enrolled in [place] or [place] Health Choice; pregnant women with a Medicaid card; and children who have applied for Medicaid or [place] Health Choice, but were declined, whose parents can pay a reduced fee at time of service.   [facility] Department of [facility]  [address], [place] [phone] Accepts children up to age 25 who are enrolled in [place] or [place] Health Choice; pregnant women with a Medicaid card; and children who have applied for Medicaid or  Health Choice, but were declined, whose parents can pay a reduced fee at time of service.  [facility]  [address], [place] [phone] Patients are seen by appointment only. Walk-ins are not accepted. [facility] will see patients 58 years of age and older. Monday - Tuesday (8am-5pm) Most Wednesdays (8:30-5pm) $30 per visit, cash only  [facility]  [address], [facility] [phone] Patients are seen by appointment only. Walk-ins are not accepted. [facility] will see patients 53 years of age and older. One Wednesday Evening (Monthly: Volunteer Based).  $30 per visit, cash only  [employer]  [phone] for adults; Children under age 25, call Graduate Pediatric Dentistry at [phone]. Children aged 64-14, please call [phone] to request a pediatric application.  Dental services are provided in all areas of dental care including fillings, crowns and bridges, complete and partial dentures, implants, gum treatment, root canals, and extractions. Preventive care is also provided. Treatment is provided to both adults and children. Patients are selected via a lottery and there is often a waiting list.   [facility] [address] [place]  [phone] [email or web address]   [facility] [address] [place], [place] [phone], Ext. [phone] Second and Fourth Thursday of each month, opens at 6:30 AM; Clinic ends at 9 AM.  Patients are seen on a first-come first-served basis, and a limited number are seen during each clinic.   [facility]  [address] Ether Griffins [place], [place] [phone]   Eligibility Requirements You must have lived in [place], [place], or [place] counties for at least the last three months.   You cannot be eligible for state or federal sponsored [place], including [employer], [place], or [employer].   You generally cannot be eligible for healthcare insurance through your employer.    How to apply: Eligibility screenings are held every Tuesday and Wednesday afternoon from 1:00 pm until 4:00 pm. You do not need an appointment for the interview!  [facility] [address] [place], [place] [phone]   [facility] Health Department  [phone]   [facility] Health Department  [phone]   [facility] Health Department  [phone]    Behavioral Health Resources in the Community: Intensive Outpatient Programs Organization         Address  Phone  Notes  [facility] [address] [place], [place] [phone]   [facility] Outpatient [address] [place], [place] [phone]   [facility] [address] [place], [place]  [phone]   [facility] Mental Health [address]  [place], [place] [phone] or [phone]   Substance Abuse Resources Organization         Address  Phone  Notes  [facility]  [phone]   [facility]  [phone]   [facility] [place]  [phone]   Floydene Flock  [phone]   Residential & Outpatient Substance Abuse Program  8507922274   Psychological Services Organization         Address  Phone  Notes  [facility] Behavioral Health  [phone]   [facility]  [phone]   [facility] Mental Health [address], [place] [phone] or [phone]    Mobile Crisis Teams Organization         Address  Phone  Notes  [facility], Mobile Crisis Care Unit  [phone]   [facility]  [address] [place], [place] [phone]   Doristine Locks [address] [place] [phone]    Self-Help/Support Groups Organization         Address  Phone             Notes  [facility] [place] - variety of support groups  336- I7437963 Call for more information  Narcotics Anonymous (NA), [facility] [address], [employer] [place]  2 meetings at this location   Statistician         Address  Phone  Notes  [facility] [address],    [place]  [phone]   [facility]  [address], [place] [address], [place], [place] [phone]   [facility] [address] [place], [place] [phone] Admissions: 8am-3pm M-F  [facility] [address],    [place], [place] [phone]   [facility] [address] Starling Manns [place], [place] [phone]   [facility] [address],  [place], [place] [phone]   [facility] - Intensive Outpatient [address] Laurell Joseph[address], [place], [place] [phone]   [facility] ([facility] [address] [place]  [place], [place] [phone] or [phone]   [facility] ([facility]) [address], [place], [place] [phone] Accepts Medicaid  [facility] [address],  [place] [phone] Substance Abuse/Addiction Treatment  [facility] Resources Organization         Address  Phone  Notes  [facility]  [phone]   Angie FavaJulie Brannon, PhD [address] [place], [place]   [phone] or [phone]) [phone]   [facility]   [address] [place], [place] [phone]) [phone]   [facility] [address], [place], [place] [phone]) [phone] Insurance/Medicaid/sponsorship through [facility] [place] [facility]  [facility] [address]                                    [place], [place] [phone]  Therapy/tele-psych/case  [facility] [address]   [place], [place] [phone]) [phone]    Dr. Lolly MustacheArfeen  [phone]   [facility] [place]  [employer] [facility] 1) [address], [place] 2) [address], [place] 3)  [address], [place] [phone]  [phone]   [facility] Child Abuse Hotline [phone]) [phone] or [phone]) [phone] (After Hours)

## 2013-06-21 NOTE — ED Notes (Signed)
U/S at bs for exam.  

## 2013-06-21 NOTE — ED Provider Notes (Signed)
CSN: [id number]     Arrival date & time 06/21/13  1515 History   First MD Initiated Contact with Patient 06/21/13 1614     Chief Complaint  Patient presents with  . Abdominal Pain     (Consider location/radiation/quality/duration/timing/severity/associated sxs/prior Treatment) The history is provided by the patient. No language interpreter was used.  Carol Welch is a 38 y/o F with PMHx of colitis presenting to the ED with abdominal pain that started yesterday AM. Patient reported that the abdominal pain started in the suprapubic region that has now migrated to the RLQ that was at first described as a cramping sensation that is now described as a constant hard, sharp pain. Patient reported that she has been feeling nauseous with at least 5-6 episodes of emesis - mainly of food and fluid contents - NB/NB. Patient denied radiation of pain. Reported that she felt mildly dizzy today - stated that she has not eaten anything today.reported vaginal discharge that started just prior to arrival to the ED mainly of a green color without an odor. Reported that she is sexually active with one partner in her whole lifetime - stated she uses condoms. Report lower back pain described as an aching sensation. Stated that she has been using Ibuprofen with minimal relief. Denied diarrhea, melena, hematochezia, chest pain, shortness of breath, difficulty breathing, abnormal vaginal bleeding, numbness, tingling, headache. PCP Dr. Karren Burlywight  Past Medical History  Diagnosis Date  . Colitis    History reviewed. No pertinent past surgical history. No family history on file. History  Substance Use Topics  . Smoking status: Never Smoker   . Smokeless tobacco: Never Used  . Alcohol Use: No   OB History   Grav Para Term Preterm Abortions TAB SAB Ect Mult Living                 Review of Systems  Constitutional: Negative for fever and chills.  Respiratory: Negative for chest tightness and shortness of breath.     Cardiovascular: Negative for chest pain.  Gastrointestinal: Positive for nausea, vomiting and abdominal pain. Negative for diarrhea, constipation, blood in stool and anal bleeding.  Genitourinary: Positive for vaginal discharge. Negative for dysuria.  Musculoskeletal: Negative for back pain and neck pain.  Neurological: Positive for dizziness. Negative for weakness.  All other systems reviewed and are negative.    Allergies  Review of patient's allergies indicates no known allergies.  Home Medications   Current Outpatient Rx  Name  Route  Sig  Dispense  Refill  . aspirin EC 325 MG tablet   Oral   Take 1 tablet (325 mg total) by mouth daily.   30 tablet   0   . ibuprofen (ADVIL,MOTRIN) 200 MG tablet   Oral   Take 400 mg by mouth every 6 (six) hours as needed (pain.).         Marland Kitchen. levothyroxine (SYNTHROID, LEVOTHROID) 100 MCG tablet   Oral   Take 100 mcg by mouth daily before breakfast.          . doxycycline (VIBRAMYCIN) 100 MG capsule   Oral   Take 1 capsule (100 mg total) by mouth 2 (two) times daily. One po bid x 7 days   14 capsule   0   . naproxen (NAPROSYN) 500 MG tablet   Oral   Take 1 tablet (500 mg total) by mouth 2 (two) times daily.   30 tablet   0    BP 118/80  Pulse 65  Temp(Src)  98.7 F (37.1 C) (Oral)  Resp 18  SpO2 99%  LMP 05/07/2013 Physical Exam  Nursing note and vitals reviewed. Constitutional: She is oriented to person, place, and time. She appears well-developed and well-nourished. No distress.  HENT:  Head: Normocephalic and atraumatic.  Mouth/Throat: Oropharynx is clear and moist. No oropharyngeal exudate.  Eyes: Conjunctivae and EOM are normal. Pupils are equal, round, and reactive to light. Right eye exhibits no discharge. Left eye exhibits no discharge.  Neck: Normal range of motion. Neck supple. No tracheal deviation present.  Cardiovascular: Normal rate, regular rhythm and normal heart sounds.  Exam reveals no friction rub.    No murmur heard. Pulses:      Radial pulses are 2+ on the right side, and 2+ on the left side.       Dorsalis pedis pulses are 2+ on the right side, and 2+ on the left side.  Pulmonary/Chest: Effort normal and breath sounds normal. No respiratory distress. She has no wheezes. She has no rales.  Abdominal: Soft. Bowel sounds are normal. There is tenderness. There is no guarding.    Discomfort upon palpation to suprapubic and right lower quadrant Negative Murphy's sign Positive McBurney's point Negative CVA tenderness bilaterally  Genitourinary: Vaginal discharge found.  Pelvic Exam: Negative swelling, erythema, inflammation, lesions, sores noted to the external genitalia. Negative swelling, erythema, inflammation, lesions, sores, fissures noted to the vaginal canal. Thin, gray discharge without an odor noted. Negative blood in vaginal vault. Negative friability noted to the cervix. Positive CMT and right adnexal tenderness. Negative left adnexal tenderness.   Musculoskeletal: Normal range of motion.  Full ROM to upper and lower extremities without difficulty noted, negative ataxia noted.  Lymphadenopathy:    She has no cervical adenopathy.  Neurological: She is alert and oriented to person, place, and time. No cranial nerve deficit. She exhibits normal muscle tone. Coordination normal.  Skin: Skin is warm and dry. No rash noted. She is not diaphoretic. No erythema.  Psychiatric: She has a normal mood and affect. Her behavior is normal. Thought content normal.    ED Course  Procedures (including critical care time)  Results for orders placed during the hospital encounter of 06/21/13  WET PREP, GENITAL      Result Value Ref Range   Yeast Wet Prep HPF POC NONE SEEN  NONE SEEN   Trich, Wet Prep NONE SEEN  NONE SEEN   Clue Cells Wet Prep HPF POC FEW (*) NONE SEEN   WBC, Wet Prep HPF POC TOO NUMEROUS TO COUNT (*) NONE SEEN  GC/CHLAMYDIA PROBE AMP      Result Value Ref Range   CT Probe  RNA NEGATIVE  NEGATIVE   GC Probe RNA NEGATIVE  NEGATIVE  CBC WITH DIFFERENTIAL      Result Value Ref Range   WBC 6.6  4.0 - 10.5 K/uL   RBC 4.40  3.87 - 5.11 MIL/uL   Hemoglobin 12.6  12.0 - 15.0 g/dL   HCT 69.6  29.5 - 28.4 %   MCV 85.0  78.0 - 100.0 fL   MCH 28.6  26.0 - 34.0 pg   MCHC 33.7  30.0 - 36.0 g/dL   RDW 13.2  44.0 - 10.2 %   Platelets 276  150 - 400 K/uL   Neutrophils Relative % 47  43 - 77 %   Neutro Abs 3.1  1.7 - 7.7 K/uL   Lymphocytes Relative 44  12 - 46 %   Lymphs Abs 2.9  0.7 -  4.0 K/uL   Monocytes Relative 7  3 - 12 %   Monocytes Absolute 0.4  0.1 - 1.0 K/uL   Eosinophils Relative 2  0 - 5 %   Eosinophils Absolute 0.1  0.0 - 0.7 K/uL   Basophils Relative 1  0 - 1 %   Basophils Absolute 0.0  0.0 - 0.1 K/uL  COMPREHENSIVE METABOLIC PANEL      Result Value Ref Range   Sodium 141  137 - 147 mEq/L   Potassium 3.6 (*) 3.7 - 5.3 mEq/L   Chloride 103  96 - 112 mEq/L   CO2 25  19 - 32 mEq/L   Glucose, Bld 97  70 - 99 mg/dL   BUN 18  6 - 23 mg/dL   Creatinine, Ser 1.61  0.50 - 1.10 mg/dL   Calcium 9.1  8.4 - 09.6 mg/dL   Total Protein 7.7  6.0 - 8.3 g/dL   Albumin 3.9  3.5 - 5.2 g/dL   AST 18  0 - 37 U/L   ALT 13  0 - 35 U/L   Alkaline Phosphatase 57  39 - 117 U/L   Total Bilirubin <0.2 (*) 0.3 - 1.2 mg/dL   GFR calc non Af Amer >90  >90 mL/min   GFR calc Af Amer >90  >90 mL/min  URINALYSIS, ROUTINE W REFLEX MICROSCOPIC      Result Value Ref Range   Color, Urine YELLOW  YELLOW   APPearance CLOUDY (*) CLEAR   Specific Gravity, Urine 1.014  1.005 - 1.030   pH 6.5  5.0 - 8.0   Glucose, UA NEGATIVE  NEGATIVE mg/dL   Hgb urine dipstick NEGATIVE  NEGATIVE   Bilirubin Urine NEGATIVE  NEGATIVE   Ketones, ur NEGATIVE  NEGATIVE mg/dL   Protein, ur NEGATIVE  NEGATIVE mg/dL   Urobilinogen, UA 0.2  0.0 - 1.0 mg/dL   Nitrite NEGATIVE  NEGATIVE   Leukocytes, UA SMALL (*) NEGATIVE  LIPASE, BLOOD      Result Value Ref Range   Lipase 30  11 - 59 U/L  URINE  MICROSCOPIC-ADD ON      Result Value Ref Range   Squamous Epithelial / LPF FEW (*) RARE   WBC, UA 0-2  <3 WBC/hpf   RBC / HPF 0-2  <3 RBC/hpf   Bacteria, UA FEW (*) RARE  TROPONIN I      Result Value Ref Range   Troponin I <0.30  <0.30 ng/mL  POCT PREGNANCY, URINE      Result Value Ref Range   Preg Test, Ur NEGATIVE  NEGATIVE   US Transvaginal Non-ob  06/21/2013   CLINICAL DATA:  Right ovarian mass. Evaluate for torsion. Negative pregnancy test.  TECHNIQUE: Both transabdominal and transvaginal ultrasound examinations of the pelvis were performed. Transabdominal technique was performed for global imaging of the pelvis including uterus, ovaries, adnexal regions, and pelvic cul-de-sac.  It was necessary to proceed with endovaginal exam following the transabdominal exam to visualize the uterus and ovaries. Color and duplex Doppler ultrasound was utilized to evaluate blood flow to the ovaries.  COMPARISON:  CT ABD/PELVIS W CM dated 06/08/2012; US PELVIS COMPLETE dated 03/27/2012  PROCEDURE: TRANSABDOMINAL AND TRANSVAGINAL ULTRASOUND OF PELVIS  DOPPLER ULTRASOUND OF OVARIES  FINDINGS: Uterus  Measurements: 10.1 x 4.6 x 5.6 cm. No fibroids or other mass visualized.  Endometrium  Thickness: Mildly prominent but stable at 15.7 mm. No focal abnormality visualized.  Right ovary  Measurements: 4.5 x 2.8 x 2.6 cm. 1.9  x 1.9 x 1.3 slightly irregular complex cyst with debris, possibly resolving complex hemorrhagic cyst. Although ectopic pregnancy could present in this fashion, pregnancy test is negative by history.  Left ovary  Measurements: 3.1 x 2.5 x 1.9 cm. Normal appearance/no adnexal mass.  Pulsed Doppler evaluation of both ovaries demonstrates normal low-resistance arterial and venous waveforms.  Other findings  Trace free fluid in cul-de-sac .  IMPRESSION: 1. No evidence of torsion. 2. 1.9 x 1.9 x 1.3 slightly irregular complex cyst with debris ,right ovary. This may represent a resolving complex hemorrhagic  cyst. This is most likely benign. Given that this is a new finding, short interval follow-up in 6-12 weeks can be obtained.   Electronically Signed   By: Maisie Fus  Register   On: 06/21/2013 19:08   US Pelvis Complete  06/21/2013   CLINICAL DATA:  Right ovarian mass. Evaluate for torsion. Negative pregnancy test.  TECHNIQUE: Both transabdominal and transvaginal ultrasound examinations of the pelvis were performed. Transabdominal technique was performed for global imaging of the pelvis including uterus, ovaries, adnexal regions, and pelvic cul-de-sac.  It was necessary to proceed with endovaginal exam following the transabdominal exam to visualize the uterus and ovaries. Color and duplex Doppler ultrasound was utilized to evaluate blood flow to the ovaries.  COMPARISON:  CT ABD/PELVIS W CM dated 06/08/2012; US PELVIS COMPLETE dated 03/27/2012  PROCEDURE: TRANSABDOMINAL AND TRANSVAGINAL ULTRASOUND OF PELVIS  DOPPLER ULTRASOUND OF OVARIES  FINDINGS: Uterus  Measurements: 10.1 x 4.6 x 5.6 cm. No fibroids or other mass visualized.  Endometrium  Thickness: Mildly prominent but stable at 15.7 mm. No focal abnormality visualized.  Right ovary  Measurements: 4.5 x 2.8 x 2.6 cm. 1.9 x 1.9 x 1.3 slightly irregular complex cyst with debris, possibly resolving complex hemorrhagic cyst. Although ectopic pregnancy could present in this fashion, pregnancy test is negative by history.  Left ovary  Measurements: 3.1 x 2.5 x 1.9 cm. Normal appearance/no adnexal mass.  Pulsed Doppler evaluation of both ovaries demonstrates normal low-resistance arterial and venous waveforms.  Other findings  Trace free fluid in cul-de-sac .  IMPRESSION: 1. No evidence of torsion. 2. 1.9 x 1.9 x 1.3 slightly irregular complex cyst with debris ,right ovary. This may represent a resolving complex hemorrhagic cyst. This is most likely benign. Given that this is a new finding, short interval follow-up in 6-12 weeks can be obtained.   Electronically Signed    By: Maisie Fus  Register   On: 06/21/2013 19:08   [place] Art/ven Flow Abd Pelv Doppler  06/21/2013   CLINICAL DATA:  Right ovarian mass. Evaluate for torsion. Negative pregnancy test.  TECHNIQUE: Both transabdominal and transvaginal ultrasound examinations of the pelvis were performed. Transabdominal technique was performed for global imaging of the pelvis including uterus, ovaries, adnexal regions, and pelvic cul-de-sac.  It was necessary to proceed with endovaginal exam following the transabdominal exam to visualize the uterus and ovaries. Color and duplex Doppler ultrasound was utilized to evaluate blood flow to the ovaries.  COMPARISON:  CT ABD/PELVIS W CM dated 06/08/2012; US PELVIS COMPLETE dated 03/27/2012  PROCEDURE: TRANSABDOMINAL AND TRANSVAGINAL ULTRASOUND OF PELVIS  DOPPLER ULTRASOUND OF OVARIES  FINDINGS: Uterus  Measurements: 10.1 x 4.6 x 5.6 cm. No fibroids or other mass visualized.  Endometrium  Thickness: Mildly prominent but stable at 15.7 mm. No focal abnormality visualized.  Right ovary  Measurements: 4.5 x 2.8 x 2.6 cm. 1.9 x 1.9 x 1.3 slightly irregular complex cyst with debris, possibly resolving complex hemorrhagic cyst. Although ectopic  pregnancy could present in this fashion, pregnancy test is negative by history.  Left ovary  Measurements: 3.1 x 2.5 x 1.9 cm. Normal appearance/no adnexal mass.  Pulsed Doppler evaluation of both ovaries demonstrates normal low-resistance arterial and venous waveforms.  Other findings  Trace free fluid in cul-de-sac .  IMPRESSION: 1. No evidence of torsion. 2. 1.9 x 1.9 x 1.3 slightly irregular complex cyst with debris ,right ovary. This may represent a resolving complex hemorrhagic cyst. This is most likely benign. Given that this is a new finding, short interval follow-up in 6-12 weeks can be obtained.   Electronically Signed   By: Maisie Fus  Register   On: 06/21/2013 19:08   Labs Review Labs Reviewed  WET PREP, GENITAL - Abnormal; Notable for the following:     Clue Cells Wet Prep HPF POC FEW (*)    WBC, Wet Prep HPF POC TOO NUMEROUS TO COUNT (*)    All other components within normal limits  COMPREHENSIVE METABOLIC PANEL - Abnormal; Notable for the following:    Potassium 3.6 (*)    Total Bilirubin <0.2 (*)    All other components within normal limits  URINALYSIS, ROUTINE W REFLEX MICROSCOPIC - Abnormal; Notable for the following:    APPearance CLOUDY (*)    Leukocytes, UA SMALL (*)    All other components within normal limits  URINE MICROSCOPIC-ADD ON - Abnormal; Notable for the following:    Squamous Epithelial / LPF FEW (*)    Bacteria, UA FEW (*)    All other components within normal limits  GC/CHLAMYDIA PROBE AMP  CBC WITH DIFFERENTIAL  LIPASE, BLOOD  TROPONIN I  POCT PREGNANCY, URINE   Imaging Review US Transvaginal Non-ob  06/21/2013   CLINICAL DATA:  Right ovarian mass. Evaluate for torsion. Negative pregnancy test.  TECHNIQUE: Both transabdominal and transvaginal ultrasound examinations of the pelvis were performed. Transabdominal technique was performed for global imaging of the pelvis including uterus, ovaries, adnexal regions, and pelvic cul-de-sac.  It was necessary to proceed with endovaginal exam following the transabdominal exam to visualize the uterus and ovaries. Color and duplex Doppler ultrasound was utilized to evaluate blood flow to the ovaries.  COMPARISON:  CT ABD/PELVIS W CM dated 06/08/2012; US PELVIS COMPLETE dated 03/27/2012  PROCEDURE: TRANSABDOMINAL AND TRANSVAGINAL ULTRASOUND OF PELVIS  DOPPLER ULTRASOUND OF OVARIES  FINDINGS: Uterus  Measurements: 10.1 x 4.6 x 5.6 cm. No fibroids or other mass visualized.  Endometrium  Thickness: Mildly prominent but stable at 15.7 mm. No focal abnormality visualized.  Right ovary  Measurements: 4.5 x 2.8 x 2.6 cm. 1.9 x 1.9 x 1.3 slightly irregular complex cyst with debris, possibly resolving complex hemorrhagic cyst. Although ectopic pregnancy could present in this fashion, pregnancy  test is negative by history.  Left ovary  Measurements: 3.1 x 2.5 x 1.9 cm. Normal appearance/no adnexal mass.  Pulsed Doppler evaluation of both ovaries demonstrates normal low-resistance arterial and venous waveforms.  Other findings  Trace free fluid in cul-de-sac .  IMPRESSION: 1. No evidence of torsion. 2. 1.9 x 1.9 x 1.3 slightly irregular complex cyst with debris ,right ovary. This may represent a resolving complex hemorrhagic cyst. This is most likely benign. Given that this is a new finding, short interval follow-up in 6-12 weeks can be obtained.   Electronically Signed   By: Maisie Fus  Register   On: 06/21/2013 19:08   US Pelvis Complete  06/21/2013   CLINICAL DATA:  Right ovarian mass. Evaluate for torsion. Negative pregnancy test.  TECHNIQUE:  Both transabdominal and transvaginal ultrasound examinations of the pelvis were performed. Transabdominal technique was performed for global imaging of the pelvis including uterus, ovaries, adnexal regions, and pelvic cul-de-sac.  It was necessary to proceed with endovaginal exam following the transabdominal exam to visualize the uterus and ovaries. Color and duplex Doppler ultrasound was utilized to evaluate blood flow to the ovaries.  COMPARISON:  CT ABD/PELVIS W CM dated 06/08/2012; US PELVIS COMPLETE dated 03/27/2012  PROCEDURE: TRANSABDOMINAL AND TRANSVAGINAL ULTRASOUND OF PELVIS  DOPPLER ULTRASOUND OF OVARIES  FINDINGS: Uterus  Measurements: 10.1 x 4.6 x 5.6 cm. No fibroids or other mass visualized.  Endometrium  Thickness: Mildly prominent but stable at 15.7 mm. No focal abnormality visualized.  Right ovary  Measurements: 4.5 x 2.8 x 2.6 cm. 1.9 x 1.9 x 1.3 slightly irregular complex cyst with debris, possibly resolving complex hemorrhagic cyst. Although ectopic pregnancy could present in this fashion, pregnancy test is negative by history.  Left ovary  Measurements: 3.1 x 2.5 x 1.9 cm. Normal appearance/no adnexal mass.  Pulsed Doppler evaluation of both  ovaries demonstrates normal low-resistance arterial and venous waveforms.  Other findings  Trace free fluid in cul-de-sac .  IMPRESSION: 1. No evidence of torsion. 2. 1.9 x 1.9 x 1.3 slightly irregular complex cyst with debris ,right ovary. This may represent a resolving complex hemorrhagic cyst. This is most likely benign. Given that this is a new finding, short interval follow-up in 6-12 weeks can be obtained.   Electronically Signed   By: Maisie Fus  Register   On: 06/21/2013 19:08   [place] Art/ven Flow Abd Pelv Doppler  06/21/2013   CLINICAL DATA:  Right ovarian mass. Evaluate for torsion. Negative pregnancy test.  TECHNIQUE: Both transabdominal and transvaginal ultrasound examinations of the pelvis were performed. Transabdominal technique was performed for global imaging of the pelvis including uterus, ovaries, adnexal regions, and pelvic cul-de-sac.  It was necessary to proceed with endovaginal exam following the transabdominal exam to visualize the uterus and ovaries. Color and duplex Doppler ultrasound was utilized to evaluate blood flow to the ovaries.  COMPARISON:  CT ABD/PELVIS W CM dated 06/08/2012; US PELVIS COMPLETE dated 03/27/2012  PROCEDURE: TRANSABDOMINAL AND TRANSVAGINAL ULTRASOUND OF PELVIS  DOPPLER ULTRASOUND OF OVARIES  FINDINGS: Uterus  Measurements: 10.1 x 4.6 x 5.6 cm. No fibroids or other mass visualized.  Endometrium  Thickness: Mildly prominent but stable at 15.7 mm. No focal abnormality visualized.  Right ovary  Measurements: 4.5 x 2.8 x 2.6 cm. 1.9 x 1.9 x 1.3 slightly irregular complex cyst with debris, possibly resolving complex hemorrhagic cyst. Although ectopic pregnancy could present in this fashion, pregnancy test is negative by history.  Left ovary  Measurements: 3.1 x 2.5 x 1.9 cm. Normal appearance/no adnexal mass.  Pulsed Doppler evaluation of both ovaries demonstrates normal low-resistance arterial and venous waveforms.  Other findings  Trace free fluid in cul-de-sac .  IMPRESSION:  1. No evidence of torsion. 2. 1.9 x 1.9 x 1.3 slightly irregular complex cyst with debris ,right ovary. This may represent a resolving complex hemorrhagic cyst. This is most likely benign. Given that this is a new finding, short interval follow-up in 6-12 weeks can be obtained.   Electronically Signed   By: Maisie Fus  Register   On: 06/21/2013 19:08    EKG Interpretation    Date/Time:  Monday June 21 2013 19:14:36 EST Ventricular Rate:  64 PR Interval:  145 QRS Duration: 93 QT Interval:  432 QTC Calculation: 446 R Axis:   65 Text  Interpretation:  Sinus rhythm Low voltage, precordial leads ED PHYSICIAN INTERPRETATION AVAILABLE IN [facility] Confirmed by TEST, RECORD ([id number]) on 06/23/2013 7:13:54 AM            MDM   Final diagnoses:  PID (acute pelvic inflammatory disease)  Right ovarian cyst   Medications  sodium chloride 0.9 % bolus 1,000 mL (0 mLs Intravenous Stopped 06/21/13 1932)  ondansetron (ZOFRAN) injection 4 mg (4 mg Intravenous Given 06/21/13 1641)  HYDROmorphone (DILAUDID) injection 0.5 mg (0.5 mg Intravenous Given 06/21/13 1948)  sodium chloride 0.9 % bolus 1,000 mL (0 mLs Intravenous Stopped 06/21/13 2027)   Filed Vitals:   06/21/13 1935 06/21/13 2128  BP: 120/85 118/80  Pulse: 72 65  Temp: 98.7 F (37.1 C)   TempSrc: Oral   Resp: 20 18  SpO2: 100% 99%    Patient presenting to emergency department with abdominal pain that started yesterday morning-at first was localized to suprapubic region described as a cramping sensation that is now migrated to the right lower quadrant described as a sharp, hard pain that is constant. Reported lower back pain described as a aching sensation that is constant. Stated that she's been using ibuprofen with minimal relief. Stated prior to arrival to the ED she started to experience vaginal discharge of a green color without an odor. Patient has history of colitis. Alert and oriented. GCS 15. Heart rate and rhythm normal. Lungs  clear to auscultation. Radial and DP pulses 2+ bilaterally. Bowel sounds normal active in all 4 quadrants. Tenderness upon palpation to suprapubic and right lower quadrant upon palpation-positive Murphy's sign. Negative CVA tenderness bilaterally. Negative acute abdomen, negative peritoneal signs. Full range of motion to upper lower extremities bilaterally without difficulty noted. Pelvic exam noted thin, gray discharge with negative order. CMT tenderness. Positive tenderness upon right adnexal region. EKG normal sinus rhythm with a heart rate of 64 beats per minute. Troponin negative elevation. CBC negative findings-negative elevation white blood cell count, negative leukocytosis or left shift noted. CMP noted mild low potassium of 3.6. Urinalysis noted small leukocytes-negative nitrites noted-negative pyuria. Urine pregnancy negative. Lipase negative elevation. Wet prep noted numerous white blood cells-numerous to count. GC Chlamydia pending. Pelvic ultrasound negative for portion-1.9 x 1.9 x 1.3 sodium regular complex cyst with debris of right ovary, may represent a resolving complex hemorrhagic cyst-most likely benign, recommend followup within 6-12 weeks. Discussed case with Dr. Genene Churn who agreed to plan of discharge - did not recommend repeat troponin.  Doubt TOA. Doubt ovarian torsion. Doubt ectopic pregnancy. Doubt appendicitis. Doubt pancreatitis. Doubt acute abdominal processes. Suspicion to be pelvic inflammatory disease. Patient presenting with right ovarian cyst. Patient stable, afebrile. Pain controlled in ED setting. Discharged patient with antibiotics and naproxen. Referred patient to PCP and woman's outpatient clinic. Discussed with patient to rest and stay hydrated. Discussed repeat US within 6 weeks. Discussed with patient to avoid any physical or strenuous activity. Discussed with patient to avoid any sexual activities until infection is clear. Discussed with patient to followup with [facility]  clinic for further workup to be performed. Discussed with patient to closely monitor symptoms and if symptoms are to worsen or change to report back to the ED - strict return instructions given.  Patient agreed to plan of care, understood, all questions answered.   Raymon Mutton, PA-C 06/23/13 1131

## 2013-06-21 NOTE — ED Notes (Signed)
Per pt, started having abdominal pain yesterday, started in pelvic region, now RLQ-vomiting

## 2013-06-22 LAB — GC/CHLAMYDIA PROBE AMP
CT Probe RNA: NEGATIVE
GC PROBE AMP APTIMA: NEGATIVE

## 2013-06-23 ENCOUNTER — Encounter (HOSPITAL_COMMUNITY): Payer: Self-pay | Admitting: Emergency Medicine

## 2013-06-23 ENCOUNTER — Emergency Department (HOSPITAL_COMMUNITY): Payer: Medicaid Other

## 2013-06-23 ENCOUNTER — Emergency Department (HOSPITAL_COMMUNITY)
Admission: EM | Admit: 2013-06-23 | Discharge: 2013-06-23 | Disposition: A | Discharge status: Home / Self Care | Payer: Medicaid Other | Attending: Emergency Medicine | Admitting: Emergency Medicine

## 2013-06-23 DIAGNOSIS — Z791 Long term (current) use of non-steroidal anti-inflammatories (NSAID): Secondary | ICD-10-CM | POA: Insufficient documentation

## 2013-06-23 DIAGNOSIS — Z792 Long term (current) use of antibiotics: Secondary | ICD-10-CM | POA: Insufficient documentation

## 2013-06-23 DIAGNOSIS — Z3202 Encounter for pregnancy test, result negative: Secondary | ICD-10-CM | POA: Insufficient documentation

## 2013-06-23 DIAGNOSIS — Z79899 Other long term (current) drug therapy: Secondary | ICD-10-CM | POA: Insufficient documentation

## 2013-06-23 DIAGNOSIS — N730 Acute parametritis and pelvic cellulitis: Secondary | ICD-10-CM | POA: Insufficient documentation

## 2013-06-23 DIAGNOSIS — Z8719 Personal history of other diseases of the digestive system: Secondary | ICD-10-CM | POA: Insufficient documentation

## 2013-06-23 DIAGNOSIS — Z7982 Long term (current) use of aspirin: Secondary | ICD-10-CM | POA: Insufficient documentation

## 2013-06-23 LAB — COMPREHENSIVE METABOLIC PANEL
ALBUMIN: 3.7 g/dL (ref 3.5–5.2)
ALT: 11 U/L (ref 0–35)
AST: 15 U/L (ref 0–37)
Alkaline Phosphatase: 50 U/L (ref 39–117)
BUN: 15 mg/dL (ref 6–23)
CO2: 24 mEq/L (ref 19–32)
Calcium: 8.7 mg/dL (ref 8.4–10.5)
Chloride: 105 mEq/L (ref 96–112)
Creatinine, Ser: 0.65 mg/dL (ref 0.50–1.10)
GFR calc Af Amer: >90 mL/min (ref >90)
GFR calc non Af Amer: >90 mL/min (ref >90)
GLUCOSE: 92 mg/dL (ref 70–99)
POTASSIUM: 4.1 meq/L (ref 3.7–5.3)
SODIUM: 138 meq/L (ref 137–147)
TOTAL PROTEIN: 6.8 g/dL (ref 6.0–8.3)
Total Bilirubin: <0.2 mg/dL — ABNORMAL LOW (ref 0.3–1.2)

## 2013-06-23 LAB — CBC WITH DIFFERENTIAL/PLATELET
Basophils Absolute: 0 10*3/uL (ref 0.0–0.1)
Basophils Relative: 1 % (ref 0–1)
EOS ABS: 0.1 10*3/uL (ref 0.0–0.7)
Eosinophils Relative: 1 % (ref 0–5)
HCT: 37.4 % (ref 36.0–46.0)
Hemoglobin: 12.9 g/dL (ref 12.0–15.0)
LYMPHS ABS: 2.4 10*3/uL (ref 0.7–4.0)
Lymphocytes Relative: 39 % (ref 12–46)
MCH: 29.4 pg (ref 26.0–34.0)
MCHC: 34.5 g/dL (ref 30.0–36.0)
MCV: 85.2 fL (ref 78.0–100.0)
Monocytes Absolute: 0.4 10*3/uL (ref 0.1–1.0)
Monocytes Relative: 6 % (ref 3–12)
NEUTROS PCT: 53 % (ref 43–77)
Neutro Abs: 3.3 10*3/uL (ref 1.7–7.7)
PLATELETS: 252 10*3/uL (ref 150–400)
RBC: 4.39 MIL/uL (ref 3.87–5.11)
RDW: 13.9 % (ref 11.5–15.5)
WBC: 6.2 10*3/uL (ref 4.0–10.5)

## 2013-06-23 LAB — URINALYSIS, ROUTINE W REFLEX MICROSCOPIC
BILIRUBIN URINE: NEGATIVE
Glucose, UA: NEGATIVE mg/dL
Hgb urine dipstick: NEGATIVE
Ketones, ur: NEGATIVE mg/dL
Nitrite: NEGATIVE
Protein, ur: NEGATIVE mg/dL
SPECIFIC GRAVITY, URINE: 1.007 (ref 1.005–1.030)
UROBILINOGEN UA: 0.2 mg/dL (ref 0.0–1.0)
pH: 7 (ref 5.0–8.0)

## 2013-06-23 LAB — LIPASE, BLOOD: Lipase: 27 U/L (ref 11–59)

## 2013-06-23 LAB — URINE MICROSCOPIC-ADD ON

## 2013-06-23 LAB — POCT PREGNANCY, URINE: Preg Test, Ur: NEGATIVE

## 2013-06-23 MED ORDER — HYDROMORPHONE HCL PF 1 MG/ML IJ SOLN
1.0000 mg | Freq: Once | INTRAMUSCULAR | Status: AC
Start: 2013-06-23 — End: 2013-06-23
  Administered 2013-06-23: 1 mg via INTRAVENOUS
  Filled 2013-06-23: qty 1

## 2013-06-23 MED ORDER — ONDANSETRON HCL 4 MG/2ML IJ SOLN
4.0000 mg | Freq: Once | INTRAMUSCULAR | Status: AC
Start: 2013-06-23 — End: 2013-06-23
  Administered 2013-06-23: 4 mg via INTRAVENOUS
  Filled 2013-06-23: qty 2

## 2013-06-23 MED ORDER — IOHEXOL 300 MG/ML  SOLN
100.0000 mL | Freq: Once | INTRAMUSCULAR | Status: AC | PRN
  Administered 2013-06-23: 100 mL via INTRAVENOUS

## 2013-06-23 MED ORDER — OXYCODONE-ACETAMINOPHEN 5-325 MG PO TABS: 1.0000 | ORAL_TABLET | ORAL | Status: DC | PRN

## 2013-06-23 MED ORDER — LIDOCAINE HCL (PF) 1 % IJ SOLN
0.9000 mL | Freq: Once | INTRAMUSCULAR | Status: AC
Start: 2013-06-23 — End: 2013-06-23
  Administered 2013-06-23: 0.9 mL
  Filled 2013-06-23: qty 2

## 2013-06-23 MED ORDER — IOHEXOL 300 MG/ML  SOLN
50.0000 mL | Freq: Once | INTRAMUSCULAR | Status: AC | PRN
  Administered 2013-06-23: 50 mL via ORAL

## 2013-06-23 MED ORDER — CEFTRIAXONE SODIUM 250 MG IJ SOLR
250.0000 mg | Freq: Once | INTRAMUSCULAR | Status: AC
Start: 2013-06-23 — End: 2013-06-23
  Administered 2013-06-23: 250 mg via INTRAMUSCULAR
  Filled 2013-06-23: qty 250

## 2013-06-23 MED ORDER — CEFTRIAXONE SODIUM 250 MG IJ SOLR: 250.0000 mg | Freq: Once | INTRAMUSCULAR | Status: DC

## 2013-06-23 MED ORDER — SODIUM CHLORIDE 0.9 % IV BOLUS (SEPSIS)
1000.0000 mL | Freq: Once | INTRAVENOUS | Status: AC
  Administered 2013-06-23: 1000 mL via INTRAVENOUS

## 2013-06-23 NOTE — ED Notes (Signed)
Chaperoned pelvic exam with Dr. Goldston.  

## 2013-06-23 NOTE — ED Provider Notes (Signed)
CSN: [id number]     Arrival date & time 06/23/13  1308 History   First MD Initiated Contact with Patient 06/23/13 1310     Chief Complaint  Patient presents with  . Abdominal Pain     (Consider location/radiation/quality/duration/timing/severity/associated sxs/prior Treatment) HPI Comments: 38 year old female presents with 3 days of lower abdominal pain. She states it started off more suprapubic and now she is having more more pain in her right lower quadrant. She was seen here 2 days ago and diagnosed with PID. She states she's been taking the doxycycline as prescribed has been taking Naprosyn with continued pain. She developed a fever of 101 yesterday. Gen. fever that high today but it went down after taking some Naprosyn. She's been having discharge over the last 3 days. No urinary symptoms. No nausea or vomiting. The pain at this time is severe.   Past Medical History  Diagnosis Date  . Colitis    History reviewed. No pertinent past surgical history. No family history on file. History  Substance Use Topics  . Smoking status: Never Smoker   . Smokeless tobacco: Never Used  . Alcohol Use: No   OB History   Grav Para Term Preterm Abortions TAB SAB Ect Mult Living                 Review of Systems  Constitutional: Positive for fever.  Respiratory: Negative for shortness of breath.   Cardiovascular: Negative for chest pain.  Gastrointestinal: Positive for abdominal pain. Negative for nausea, vomiting, diarrhea and constipation.  Genitourinary: Positive for vaginal discharge. Negative for hematuria and dyspareunia.  Musculoskeletal: Negative for back pain.  All other systems reviewed and are negative.      Allergies  Review of patient's allergies indicates no known allergies.  Home Medications   Current Outpatient Rx  Name  Route  Sig  Dispense  Refill  . aspirin EC 325 MG tablet   Oral   Take 1 tablet (325 mg total) by mouth daily.   30 tablet   0   .  doxycycline (VIBRAMYCIN) 100 MG capsule   Oral   Take 1 capsule (100 mg total) by mouth 2 (two) times daily. One po bid x 7 days   14 capsule   0   . ibuprofen (ADVIL,MOTRIN) 200 MG tablet   Oral   Take 400 mg by mouth every 6 (six) hours as needed for mild pain (pain.).          Marland Kitchen levothyroxine (SYNTHROID, LEVOTHROID) 100 MCG tablet   Oral   Take 100 mcg by mouth daily before breakfast.          . naproxen (NAPROSYN) 500 MG tablet   Oral   Take 1 tablet (500 mg total) by mouth 2 (two) times daily.   30 tablet   0    BP 119/66  Pulse 72  Temp(Src) 98 F (36.7 C) (Oral)  Resp 20  SpO2 99%  LMP 05/07/2013 Physical Exam  Nursing note and vitals reviewed. Constitutional: She is oriented to person, place, and time. She appears well-developed and well-nourished.  HENT:  Head: Normocephalic and atraumatic.  Right Ear: External ear normal.  Left Ear: External ear normal.  Nose: Nose normal.  Eyes: Right eye exhibits no discharge. Left eye exhibits no discharge.  Cardiovascular: Normal rate, regular rhythm and normal heart sounds.   Pulmonary/Chest: Effort normal and breath sounds normal.  Abdominal: Soft. There is tenderness in the right lower quadrant and suprapubic area. There  is no rigidity.  Genitourinary: Uterus is tender. Cervix exhibits motion tenderness and discharge. Right adnexum displays no mass. Left adnexum displays no mass.  Neurological: She is alert and oriented to person, place, and time.  Skin: Skin is warm and dry.    ED Course  Procedures (including critical care time) Labs Review Labs Reviewed  URINALYSIS, ROUTINE W REFLEX MICROSCOPIC - Abnormal; Notable for the following:    APPearance CLOUDY (*)    Leukocytes, UA LARGE (*)    All other components within normal limits  COMPREHENSIVE METABOLIC PANEL - Abnormal; Notable for the following:    Total Bilirubin <0.2 (*)    All other components within normal limits  URINE MICROSCOPIC-ADD ON -  Abnormal; Notable for the following:    Squamous Epithelial / LPF FEW (*)    All other components within normal limits  CBC WITH DIFFERENTIAL  LIPASE, BLOOD  POCT PREGNANCY, URINE   Imaging Review US Transvaginal Non-ob  06/21/2013   CLINICAL DATA:  Right ovarian mass. Evaluate for torsion. Negative pregnancy test.  TECHNIQUE: Both transabdominal and transvaginal ultrasound examinations of the pelvis were performed. Transabdominal technique was performed for global imaging of the pelvis including uterus, ovaries, adnexal regions, and pelvic cul-de-sac.  It was necessary to proceed with endovaginal exam following the transabdominal exam to visualize the uterus and ovaries. Color and duplex Doppler ultrasound was utilized to evaluate blood flow to the ovaries.  COMPARISON:  CT ABD/PELVIS W CM dated 06/08/2012; US PELVIS COMPLETE dated 03/27/2012  PROCEDURE: TRANSABDOMINAL AND TRANSVAGINAL ULTRASOUND OF PELVIS  DOPPLER ULTRASOUND OF OVARIES  FINDINGS: Uterus  Measurements: 10.1 x 4.6 x 5.6 cm. No fibroids or other mass visualized.  Endometrium  Thickness: Mildly prominent but stable at 15.7 mm. No focal abnormality visualized.  Right ovary  Measurements: 4.5 x 2.8 x 2.6 cm. 1.9 x 1.9 x 1.3 slightly irregular complex cyst with debris, possibly resolving complex hemorrhagic cyst. Although ectopic pregnancy could present in this fashion, pregnancy test is negative by history.  Left ovary  Measurements: 3.1 x 2.5 x 1.9 cm. Normal appearance/no adnexal mass.  Pulsed Doppler evaluation of both ovaries demonstrates normal low-resistance arterial and venous waveforms.  Other findings  Trace free fluid in cul-de-sac .  IMPRESSION: 1. No evidence of torsion. 2. 1.9 x 1.9 x 1.3 slightly irregular complex cyst with debris ,right ovary. This may represent a resolving complex hemorrhagic cyst. This is most likely benign. Given that this is a new finding, short interval follow-up in 6-12 weeks can be obtained.   Electronically  Signed   By: Maisie Fus  Register   On: 06/21/2013 19:08   US Pelvis Complete  06/21/2013   CLINICAL DATA:  Right ovarian mass. Evaluate for torsion. Negative pregnancy test.  TECHNIQUE: Both transabdominal and transvaginal ultrasound examinations of the pelvis were performed. Transabdominal technique was performed for global imaging of the pelvis including uterus, ovaries, adnexal regions, and pelvic cul-de-sac.  It was necessary to proceed with endovaginal exam following the transabdominal exam to visualize the uterus and ovaries. Color and duplex Doppler ultrasound was utilized to evaluate blood flow to the ovaries.  COMPARISON:  CT ABD/PELVIS W CM dated 06/08/2012; US PELVIS COMPLETE dated 03/27/2012  PROCEDURE: TRANSABDOMINAL AND TRANSVAGINAL ULTRASOUND OF PELVIS  DOPPLER ULTRASOUND OF OVARIES  FINDINGS: Uterus  Measurements: 10.1 x 4.6 x 5.6 cm. No fibroids or other mass visualized.  Endometrium  Thickness: Mildly prominent but stable at 15.7 mm. No focal abnormality visualized.  Right ovary  Measurements: 4.5  x 2.8 x 2.6 cm. 1.9 x 1.9 x 1.3 slightly irregular complex cyst with debris, possibly resolving complex hemorrhagic cyst. Although ectopic pregnancy could present in this fashion, pregnancy test is negative by history.  Left ovary  Measurements: 3.1 x 2.5 x 1.9 cm. Normal appearance/no adnexal mass.  Pulsed Doppler evaluation of both ovaries demonstrates normal low-resistance arterial and venous waveforms.  Other findings  Trace free fluid in cul-de-sac .  IMPRESSION: 1. No evidence of torsion. 2. 1.9 x 1.9 x 1.3 slightly irregular complex cyst with debris ,right ovary. This may represent a resolving complex hemorrhagic cyst. This is most likely benign. Given that this is a new finding, short interval follow-up in 6-12 weeks can be obtained.   Electronically Signed   By: Maisie Fus  Register   On: 06/21/2013 19:08   Ct Abdomen Pelvis W Contrast  06/23/2013   CLINICAL DATA:  Right lower quadrant and right  upper quadrant pain. Fever last night.  EXAM: CT ABDOMEN AND PELVIS WITH CONTRAST  TECHNIQUE: Multidetector CT imaging of the abdomen and pelvis was performed using the standard protocol following bolus administration of intravenous contrast.  CONTRAST:  50mL OMNIPAQUE IOHEXOL 300 MG/ML SOLN, OMNIPAQUE IOHEXOL 300 MG/ML SOLN  COMPARISON:  CT scan dated 06/08/2012. Pelvic ultrasound dated 06/21/2013  FINDINGS: There is a partially collapsed benign appearing 17 mm cyst in the right ovary. Left ovary and uterus are normal.  Liver, biliary tree, spleen, pancreas, adrenal glands, and kidneys are normal. Bowel is normal including the terminal ileum and appendix.  There is no free air or free fluid in the abdomen. Osseous structures are normal. Lung bases are clear.  IMPRESSION: Partially collapsed benign appearing cyst on the right ovary. Otherwise, normal CT scan of the abdomen and pelvis.   Electronically Signed   By: Geanie Cooley M.D.   On: 06/23/2013 15:13   [place] Art/ven Flow Abd Pelv Doppler  06/21/2013   CLINICAL DATA:  Right ovarian mass. Evaluate for torsion. Negative pregnancy test.  TECHNIQUE: Both transabdominal and transvaginal ultrasound examinations of the pelvis were performed. Transabdominal technique was performed for global imaging of the pelvis including uterus, ovaries, adnexal regions, and pelvic cul-de-sac.  It was necessary to proceed with endovaginal exam following the transabdominal exam to visualize the uterus and ovaries. Color and duplex Doppler ultrasound was utilized to evaluate blood flow to the ovaries.  COMPARISON:  CT ABD/PELVIS W CM dated 06/08/2012; US PELVIS COMPLETE dated 03/27/2012  PROCEDURE: TRANSABDOMINAL AND TRANSVAGINAL ULTRASOUND OF PELVIS  DOPPLER ULTRASOUND OF OVARIES  FINDINGS: Uterus  Measurements: 10.1 x 4.6 x 5.6 cm. No fibroids or other mass visualized.  Endometrium  Thickness: Mildly prominent but stable at 15.7 mm. No focal abnormality visualized.  Right ovary   Measurements: 4.5 x 2.8 x 2.6 cm. 1.9 x 1.9 x 1.3 slightly irregular complex cyst with debris, possibly resolving complex hemorrhagic cyst. Although ectopic pregnancy could present in this fashion, pregnancy test is negative by history.  Left ovary  Measurements: 3.1 x 2.5 x 1.9 cm. Normal appearance/no adnexal mass.  Pulsed Doppler evaluation of both ovaries demonstrates normal low-resistance arterial and venous waveforms.  Other findings  Trace free fluid in cul-de-sac .  IMPRESSION: 1. No evidence of torsion. 2. 1.9 x 1.9 x 1.3 slightly irregular complex cyst with debris ,right ovary. This may represent a resolving complex hemorrhagic cyst. This is most likely benign. Given that this is a new finding, short interval follow-up in 6-12 weeks can be obtained.  Electronically Signed   By: Maisie Fushomas  Register   On: 06/21/2013 19:08    EKG Interpretation   None       MDM   Final diagnoses:  PID (acute pelvic inflammatory disease)    Patient appears well here with focal tenderness in RLQ and suprapubic. Soft abd, no peritonitis. No pain with heel tap. Afebrile, no WBC elevation here. CT obtained, shows no appy or obvious TOA. Given she just had an u/s to r/o torsion I do not believe she needs another. Was not given rocephin IM last time, will give here. D/w Dr. Debroah LoopArnold of GYN who recommends continuing doxy and as long as she can tolerate po given her stronger pain meds and strict return precautions. Otherwise he encourages outpatient course. Recommends if she has worsening pain [facility] Discussed this and strict return precautions with patient, who verbalized understanding.    Audree CamelScott T Marai Teehan, MD 06/23/13 [phone]

## 2013-06-23 NOTE — ED Notes (Signed)
Pt to CT

## 2013-06-23 NOTE — Discharge Instructions (Signed)
If you develop fevers, vomiting, worsening abdominal pain or other concerning symptoms return to the ER here or at [facility] for evaluation. Otherwise follow closely with the Gynecologist's at [facility]. Continue to take your doxycycline.   Pelvic Inflammatory Disease Pelvic inflammatory disease (PID) refers to an infection in some or all of the female organs. The infection can be in the uterus, ovaries, fallopian tubes, or the surrounding tissues in the pelvis. PID can cause abdominal or pelvic pain that comes on suddenly (acute pelvic pain). PID is a serious infection because it can lead to lasting (chronic) pelvic pain or the inability to have children (infertile).  CAUSES  The infection is often caused by the normal bacteria found in the vaginal tissues. PID may also be caused by an infection that is spread during sexual contact. PID can also occur following:   The birth of a baby.   A miscarriage.   An abortion.   Major pelvic surgery.   The use of an intrauterine device (IUD).   A sexual assault.  RISK FACTORS Certain factors can put a person at higher risk for PID, such as:  Being younger than 25 years.  Being sexually active at [place] age.  Usingnonbarrier contraception.  Havingmultiple sexual partners.  Having sex with someone who has symptoms of a genital infection.  Using oral contraception. Other times, certain behaviors can increase the possibility of getting PID, such as:  Having sex during your period.  Using a vaginal douche.  Having an intrauterine device (IUD) in place. SYMPTOMS   Abdominal or pelvic pain.   Fever.   Chills.   Abnormal vaginal discharge.  Abnormal uterine bleeding.   Unusual pain shortly after finishing your period. DIAGNOSIS  Your caregiver will choose some of the following methods to make a diagnosis, such as:   Performinga physical exam and history. A pelvic exam typically reveals a very tender  uterus and surrounding pelvis.   Ordering laboratory tests including a pregnancy test, blood tests, and urine test.  Orderingcultures of the vagina and cervix to check for a sexually transmitted infection (STI).  Performing an ultrasound.   Performing a laparoscopic procedure to look inside the pelvis.  TREATMENT   Antibiotic medicines may be prescribed and taken by mouth.   Sexual partners may be treated when the infection is caused by a sexually transmitted disease (STD).   Hospitalization may be needed to give antibiotics intravenously.  Surgery may be needed, but this is rare. It may take weeks until you are completely well. If you are diagnosed with PID, you should also be checked for human immunodeficiency virus (HIV). HOME CARE INSTRUCTIONS   If given, take your antibiotics as directed. Finish the medicine even if you start to feel better.   Only take over-the-counter or prescription medicines for pain, discomfort, or fever as directed by your caregiver.   Do not have sexual intercourse until treatment is completed or as directed by your caregiver. If PID is confirmed, your recent sexual partner(s) will need treatment.   Keep your follow-up appointments. SEEK MEDICAL CARE IF:   You have increased or abnormal vaginal discharge.   You need prescription medicine for your pain.   You vomit.   You cannot take your medicines.   Your partner has an STD.  SEEK IMMEDIATE MEDICAL CARE IF:   You have a fever.   You have increased abdominal or pelvic pain.   You have chills.   You have pain when you urinate.  You are not better after 72 hours following treatment.  MAKE SURE YOU:   Understand these instructions.  Will watch your condition.  Will get help right away if you are not doing well or get worse. Document Released: 04/22/2005 Document Revised: 08/17/2012 Document Reviewed: 04/18/2011 [facility] Patient Information 2014 [employer]  [place].

## 2013-06-23 NOTE — ED Notes (Signed)
Pt ret from CT

## 2013-06-23 NOTE — ED Notes (Signed)
Pt a+ox4, presents from home with c/o RLQ abd pain rad into RUQ.  Pt reports just seen here in ED x2 days ago for same with no relief.  Pt reports 7/10 pain.  +nausea.  No vomiting, diarrhea or constipation.  Pt reports able to tolerate small amounts of PO.  Pt reports fever last night.  Abd s/nt/nd, +bsx4 quads. +dysuria, no changes with bowels.  Skin PWD.  MAEI.  Speaking full/clear sentences.  NAD.  Changed to hospital gown, awaiting EDP eval.

## 2013-06-24 ENCOUNTER — Inpatient Hospital Stay (HOSPITAL_COMMUNITY)
Admission: AD | Admit: 2013-06-24 | Discharge: 2013-06-24 | Disposition: A | Discharge status: Home / Self Care | Payer: Medicaid Other | Source: Ambulatory Visit | Attending: Obstetrics & Gynecology | Admitting: Obstetrics & Gynecology

## 2013-06-24 ENCOUNTER — Encounter (HOSPITAL_COMMUNITY): Payer: Self-pay | Admitting: *Deleted

## 2013-06-24 DIAGNOSIS — B349 Viral infection, unspecified: Secondary | ICD-10-CM

## 2013-06-24 DIAGNOSIS — R109 Unspecified abdominal pain: Secondary | ICD-10-CM | POA: Insufficient documentation

## 2013-06-24 DIAGNOSIS — A088 Other specified intestinal infections: Secondary | ICD-10-CM | POA: Insufficient documentation

## 2013-06-24 DIAGNOSIS — N83209 Unspecified ovarian cyst, unspecified side: Secondary | ICD-10-CM | POA: Insufficient documentation

## 2013-06-24 DIAGNOSIS — N739 Female pelvic inflammatory disease, unspecified: Secondary | ICD-10-CM | POA: Insufficient documentation

## 2013-06-24 DIAGNOSIS — R1110 Vomiting, unspecified: Secondary | ICD-10-CM | POA: Insufficient documentation

## 2013-06-24 DIAGNOSIS — B9789 Other viral agents as the cause of diseases classified elsewhere: Secondary | ICD-10-CM

## 2013-06-24 DIAGNOSIS — Z3202 Encounter for pregnancy test, result negative: Secondary | ICD-10-CM | POA: Insufficient documentation

## 2013-06-24 HISTORY — DX: Hypothyroidism, unspecified: E03.9

## 2013-06-24 LAB — URINE MICROSCOPIC-ADD ON

## 2013-06-24 LAB — URINALYSIS, ROUTINE W REFLEX MICROSCOPIC
Bilirubin Urine: NEGATIVE
Glucose, UA: NEGATIVE mg/dL
Hgb urine dipstick: NEGATIVE
Ketones, ur: NEGATIVE mg/dL
NITRITE: NEGATIVE
PROTEIN: NEGATIVE mg/dL
Specific Gravity, Urine: 1.01 (ref 1.005–1.030)
Urobilinogen, UA: 0.2 mg/dL (ref 0.0–1.0)
pH: 7 (ref 5.0–8.0)

## 2013-06-24 MED ORDER — DICYCLOMINE HCL 20 MG PO TABS
20.0000 mg | ORAL_TABLET | Freq: Once | ORAL | Status: AC
  Administered 2013-06-24: 20 mg via ORAL
  Filled 2013-06-24: qty 1

## 2013-06-24 MED ORDER — ONDANSETRON 8 MG PO TBDP
8.0000 mg | ORAL_TABLET | Freq: Once | ORAL | Status: AC
  Administered 2013-06-24: 8 mg via ORAL
  Filled 2013-06-24: qty 1

## 2013-06-24 MED ORDER — ONDANSETRON 4 MG PO TBDP: 4.0000 mg | ORAL_TABLET | Freq: Four times a day (QID) | ORAL | Status: DC | PRN

## 2013-06-24 MED ORDER — DICYCLOMINE HCL 10 MG PO CAPS: 10.0000 mg | ORAL_CAPSULE | Freq: Three times a day (TID) | ORAL | Status: DC

## 2013-06-24 NOTE — MAU Provider Note (Signed)
History     CSN: [id number]  Arrival date and time: 06/24/13 1230   First Provider Initiated Contact with Patient 06/24/13 1336      Chief Complaint  Patient presents with  . Abdominal Pain   HPI Carol Welch is a 38 y.o. female who presents with c/o of abdominal pain x 5 days. She describes the pain as a constant, sharp stabbing sensation in the RLQ and suprapubic region but L>R. She states that it has increased in intensity since onset. She was seen in the ED at [facility] on 06/21/2013 and 06/23/2013 where she was told she had PID and right ovarian cyst. They told her to come to the MAU if her symptoms worsened. She also c/o green vaginal discharge and HA but denies N/V/D, CP, SOB, dysuria, hematuria, or vaginal bleeding.     OB History   Grav Para Term Preterm Abortions TAB SAB Ect Mult Living   4 3 3  1  1   3       Past Medical History  Diagnosis Date  . Colitis   . Hypothyroid     Past Surgical History  Procedure Laterality Date  . No past surgeries      Family History  Problem Relation Age of Onset  . Heart disease Father   . Cancer Maternal Aunt   . Cancer Maternal Grandmother   . Heart disease Paternal Grandfather     History  Substance Use Topics  . Smoking status: Never Smoker   . Smokeless tobacco: Never Used  . Alcohol Use: No    Allergies: No Known Allergies  Prescriptions prior to admission  Medication Sig Dispense Refill  . aspirin EC 325 MG tablet Take 1 tablet (325 mg total) by mouth daily.  30 tablet  0  . doxycycline (VIBRAMYCIN) 100 MG capsule Take 1 capsule (100 mg total) by mouth 2 (two) times daily. One po bid x 7 days  14 capsule  0  . levothyroxine (SYNTHROID, LEVOTHROID) 100 MCG tablet Take 100 mcg by mouth daily before breakfast.       . naproxen (NAPROSYN) 500 MG tablet Take 1 tablet (500 mg total) by mouth 2 (two) times daily.  30 tablet  0  . oxyCODONE-acetaminophen (PERCOCET/ROXICET) 5-325 MG per tablet Take 1-2 tablets by mouth every 4  (four) hours as needed for severe pain.        ROS Physical Exam   Blood pressure 97/52, pulse 63, temperature 98 F (36.7 C), temperature source Oral, resp. rate 18, height 5' 1.5" (1.562 m), weight 87.544 kg (193 lb), last menstrual period 06/07/2013.  Physical Exam Constitutional: She is oriented to person, place, and time. She appears well-developed and well-nourished. No distress.  HENT:  Head: Normocephalic and atraumatic.  Eyes: Conjunctivae normal and EOM are normal. No scleral icterus.  Cardiovascular: Normal rate, regular rhythm and normal heart sounds. Exam reveals no gallop and no friction rub. No murmur heard.  Pulmonary/Chest: Effort normal and breath sounds normal. No respiratory distress.  Abdominal: Soft. Bowel sounds are normal. She exhibits no distension and no mass. Mild tenderness in RLQ and suprapubic region    Neurological: She is alert and oriented to person, place, and time.  Skin: Skin is warm and dry. She is not diaphoretic.      MAU Course  Procedures  US Transvaginal Non-ob  06/21/2013   CLINICAL DATA:  Right ovarian mass. Evaluate for torsion. Negative pregnancy test.  TECHNIQUE: Both transabdominal and transvaginal ultrasound examinations of the  pelvis were performed. Transabdominal technique was performed for global imaging of the pelvis including uterus, ovaries, adnexal regions, and pelvic cul-de-sac.  It was necessary to proceed with endovaginal exam following the transabdominal exam to visualize the uterus and ovaries. Color and duplex Doppler ultrasound was utilized to evaluate blood flow to the ovaries.  COMPARISON:  CT ABD/PELVIS W CM dated 06/08/2012; US PELVIS COMPLETE dated 03/27/2012  PROCEDURE: TRANSABDOMINAL AND TRANSVAGINAL ULTRASOUND OF PELVIS  DOPPLER ULTRASOUND OF OVARIES  FINDINGS: Uterus  Measurements: 10.1 x 4.6 x 5.6 cm. No fibroids or other mass visualized.  Endometrium  Thickness: Mildly prominent but stable at 15.7 mm. No focal  abnormality visualized.  Right ovary  Measurements: 4.5 x 2.8 x 2.6 cm. 1.9 x 1.9 x 1.3 slightly irregular complex cyst with debris, possibly resolving complex hemorrhagic cyst. Although ectopic pregnancy could present in this fashion, pregnancy test is negative by history.  Left ovary  Measurements: 3.1 x 2.5 x 1.9 cm. Normal appearance/no adnexal mass.  Pulsed Doppler evaluation of both ovaries demonstrates normal low-resistance arterial and venous waveforms.  Other findings  Trace free fluid in cul-de-sac .  IMPRESSION: 1. No evidence of torsion. 2. 1.9 x 1.9 x 1.3 slightly irregular complex cyst with debris ,right ovary. This may represent a resolving complex hemorrhagic cyst. This is most likely benign. Given that this is a new finding, short interval follow-up in 6-12 weeks can be obtained.   Electronically Signed   By: Maisie Fushomas  Register   On: 06/21/2013 19:08   [place] Pelvis Complete  06/21/2013   CLINICAL DATA:  Right ovarian mass. Evaluate for torsion. Negative pregnancy test.  TECHNIQUE: Both transabdominal and transvaginal ultrasound examinations of the pelvis were performed. Transabdominal technique was performed for global imaging of the pelvis including uterus, ovaries, adnexal regions, and pelvic cul-de-sac.  It was necessary to proceed with endovaginal exam following the transabdominal exam to visualize the uterus and ovaries. Color and duplex Doppler ultrasound was utilized to evaluate blood flow to the ovaries.  COMPARISON:  CT ABD/PELVIS W CM dated 06/08/2012; US PELVIS COMPLETE dated 03/27/2012  PROCEDURE: TRANSABDOMINAL AND TRANSVAGINAL ULTRASOUND OF PELVIS  DOPPLER ULTRASOUND OF OVARIES  FINDINGS: Uterus  Measurements: 10.1 x 4.6 x 5.6 cm. No fibroids or other mass visualized.  Endometrium  Thickness: Mildly prominent but stable at 15.7 mm. No focal abnormality visualized.  Right ovary  Measurements: 4.5 x 2.8 x 2.6 cm. 1.9 x 1.9 x 1.3 slightly irregular complex cyst with debris, possibly resolving  complex hemorrhagic cyst. Although ectopic pregnancy could present in this fashion, pregnancy test is negative by history.  Left ovary  Measurements: 3.1 x 2.5 x 1.9 cm. Normal appearance/no adnexal mass.  Pulsed Doppler evaluation of both ovaries demonstrates normal low-resistance arterial and venous waveforms.  Other findings  Trace free fluid in cul-de-sac .  IMPRESSION: 1. No evidence of torsion. 2. 1.9 x 1.9 x 1.3 slightly irregular complex cyst with debris ,right ovary. This may represent a resolving complex hemorrhagic cyst. This is most likely benign. Given that this is a new finding, short interval follow-up in 6-12 weeks can be obtained.   Electronically Signed   By: Maisie Fushomas  Register   On: 06/21/2013 19:08   Ct Abdomen Pelvis W Contrast  06/23/2013   CLINICAL DATA:  Right lower quadrant and right upper quadrant pain. Fever last night.  EXAM: CT ABDOMEN AND PELVIS WITH CONTRAST  TECHNIQUE: Multidetector CT imaging of the abdomen and pelvis was performed using the standard protocol following bolus  administration of intravenous contrast.  CONTRAST:  50mL OMNIPAQUE IOHEXOL 300 MG/ML SOLN, OMNIPAQUE IOHEXOL 300 MG/ML SOLN  COMPARISON:  CT scan dated 06/08/2012. Pelvic ultrasound dated 06/21/2013  FINDINGS: There is a partially collapsed benign appearing 17 mm cyst in the right ovary. Left ovary and uterus are normal.  Liver, biliary tree, spleen, pancreas, adrenal glands, and kidneys are normal. Bowel is normal including the terminal ileum and appendix.  There is no free air or free fluid in the abdomen. Osseous structures are normal. Lung bases are clear.  IMPRESSION: Partially collapsed benign appearing cyst on the right ovary. Otherwise, normal CT scan of the abdomen and pelvis.   Electronically Signed   By: Geanie Cooley M.D.   On: 06/23/2013 15:13   [place] Art/ven Flow Abd Pelv Doppler  06/21/2013   CLINICAL DATA:  Right ovarian mass. Evaluate for torsion. Negative pregnancy test.  TECHNIQUE: Both  transabdominal and transvaginal ultrasound examinations of the pelvis were performed. Transabdominal technique was performed for global imaging of the pelvis including uterus, ovaries, adnexal regions, and pelvic cul-de-sac.  It was necessary to proceed with endovaginal exam following the transabdominal exam to visualize the uterus and ovaries. Color and duplex Doppler ultrasound was utilized to evaluate blood flow to the ovaries.  COMPARISON:  CT ABD/PELVIS W CM dated 06/08/2012; US PELVIS COMPLETE dated 03/27/2012  PROCEDURE: TRANSABDOMINAL AND TRANSVAGINAL ULTRASOUND OF PELVIS  DOPPLER ULTRASOUND OF OVARIES  FINDINGS: Uterus  Measurements: 10.1 x 4.6 x 5.6 cm. No fibroids or other mass visualized.  Endometrium  Thickness: Mildly prominent but stable at 15.7 mm. No focal abnormality visualized.  Right ovary  Measurements: 4.5 x 2.8 x 2.6 cm. 1.9 x 1.9 x 1.3 slightly irregular complex cyst with debris, possibly resolving complex hemorrhagic cyst. Although ectopic pregnancy could present in this fashion, pregnancy test is negative by history.  Left ovary  Measurements: 3.1 x 2.5 x 1.9 cm. Normal appearance/no adnexal mass.  Pulsed Doppler evaluation of both ovaries demonstrates normal low-resistance arterial and venous waveforms.  Other findings  Trace free fluid in cul-de-sac .  IMPRESSION: 1. No evidence of torsion. 2. 1.9 x 1.9 x 1.3 slightly irregular complex cyst with debris ,right ovary. This may represent a resolving complex hemorrhagic cyst. This is most likely benign. Given that this is a new finding, short interval follow-up in 6-12 weeks can be obtained.   Electronically Signed   By: Maisie Fus  Register   On: 06/21/2013 19:08   Results for orders placed during the hospital encounter of 06/24/13 (from the past 24 hour(s))  URINALYSIS, ROUTINE W REFLEX MICROSCOPIC     Status: Abnormal   Collection Time    06/24/13 12:30 PM      Result Value Ref Range   Color, Urine YELLOW  YELLOW   APPearance CLEAR   CLEAR   Specific Gravity, Urine 1.010  1.005 - 1.030   pH 7.0  5.0 - 8.0   Glucose, UA NEGATIVE  NEGATIVE mg/dL   Hgb urine dipstick NEGATIVE  NEGATIVE   Bilirubin Urine NEGATIVE  NEGATIVE   Ketones, ur NEGATIVE  NEGATIVE mg/dL   Protein, ur NEGATIVE  NEGATIVE mg/dL   Urobilinogen, UA 0.2  0.0 - 1.0 mg/dL   Nitrite NEGATIVE  NEGATIVE   Leukocytes, UA MODERATE (*) NEGATIVE  URINE MICROSCOPIC-ADD ON     Status: Abnormal   Collection Time    06/24/13 12:30 PM      Result Value Ref Range   Squamous Epithelial / LPF RARE  RARE   WBC, UA 7-10  <3 WBC/hpf   RBC / HPF 0-2  <3 RBC/hpf   Bacteria, UA FEW (*) RARE    MDM -UA -Bentyl 20mg  PO once  -Zofran 8mg  PO once    Assessment and Plan  Viral gastroenteritis  -Report of 101 degree fever at home  -New onset of vomiting  -Zofran 4mg  po q6h  -Bentyl 10mg  po ac and qhs  PID -Continue doxycycline 100mg  po bid x 7 days  Right ovarian cyst -Continue naproxen 500mg  po bid -Continue percocet 5-325mg  po q4h prn      Milan, Bennie-John, T 06/24/2013, 2:02 PM   Seen and examined by me also Discussed in detail with Dr Macon Large who also reviewed records and results Agree with note Aviva Signs, CNM

## 2013-06-24 NOTE — Discharge Instructions (Signed)
Viral Gastroenteritis Viral gastroenteritis is also known as stomach flu. This condition affects the stomach and intestinal tract. It can cause sudden diarrhea and vomiting. The illness typically lasts 3 to 8 days. Most people develop an immune response that eventually gets rid of the virus. While this natural response develops, the virus can make you quite ill. CAUSES  Many different viruses can cause gastroenteritis, such as rotavirus or noroviruses. You can catch one of these viruses by consuming contaminated food or water. You may also catch a virus by sharing utensils or other personal items with an infected person or by touching a contaminated surface. SYMPTOMS  The most common symptoms are diarrhea and vomiting. These problems can cause a severe loss of body fluids (dehydration) and a body salt (electrolyte) imbalance. Other symptoms may include:  Fever.  Headache.  Fatigue.  Abdominal pain. DIAGNOSIS  Your caregiver can usually diagnose viral gastroenteritis based on your symptoms and a physical exam. A stool sample may also be taken to test for the presence of viruses or other infections. TREATMENT  This illness typically goes away on its own. Treatments are aimed at rehydration. The most serious cases of viral gastroenteritis involve vomiting so severely that you are not able to keep fluids down. In these cases, fluids must be given through an intravenous line (IV). HOME CARE INSTRUCTIONS   Drink enough fluids to keep your urine clear or pale yellow. Drink small amounts of fluids frequently and increase the amounts as tolerated.  Ask your caregiver for specific rehydration instructions.  Avoid:  Foods high in sugar.  Alcohol.  Carbonated drinks.  Tobacco.  Juice.  Caffeine drinks.  Extremely hot or cold fluids.  Fatty, greasy foods.  Too much intake of anything at one time.  Dairy products until 24 to 48 hours after diarrhea stops.  You may consume probiotics.  Probiotics are active cultures of beneficial bacteria. They may lessen the amount and number of diarrheal stools in adults. Probiotics can be found in yogurt with active cultures and in supplements.  Wash your hands well to avoid spreading the virus.  Only take over-the-counter or prescription medicines for pain, discomfort, or fever as directed by your caregiver. Do not give aspirin to children. Antidiarrheal medicines are not recommended.  Ask your caregiver if you should continue to take your regular prescribed and over-the-counter medicines.  Keep all follow-up appointments as directed by your caregiver. SEEK IMMEDIATE MEDICAL CARE IF:   You are unable to keep fluids down.  You do not urinate at least once every 6 to 8 hours.  You develop shortness of breath.  You notice blood in your stool or vomit. This may look like coffee grounds.  You have abdominal pain that increases or is concentrated in one small area (localized).  You have persistent vomiting or diarrhea.  You have a fever.  The patient is a child younger than 3 months, and he or she has a fever.  The patient is a child older than 3 months, and he or she has a fever and persistent symptoms.  The patient is a child older than 3 months, and he or she has a fever and symptoms suddenly get worse.  The patient is a baby, and he or she has no tears when crying. MAKE SURE YOU:   Understand these instructions.  Will watch your condition.  Will get help right away if you are not doing well or get worse. Document Released: 04/22/2005 Document Revised: 07/15/2011 Document Reviewed: 02/06/2011   [employer] Patient Information 2014 [employer].  

## 2013-06-24 NOTE — MAU Note (Signed)
Having abd pain since beginning of week.  [facility], was given medications.  Told infection on uterus and ovary.  Told to come here is pain continued or worsened

## 2013-06-24 NOTE — MAU Note (Signed)
Seen in ED yesterday; [facility]; c/o increased pain today;

## 2013-06-24 NOTE — MAU Provider Note (Signed)
Attestation of Attending Supervision of Advanced Practitioner (PA/CNM/NP): Evaluation and management procedures were performed by the Advanced Practitioner under my supervision and collaboration.  I have reviewed the Advanced Practitioner's note and chart, and I agree with the management and plan.  Ryman Rathgeber, MD, FACOG Attending Obstetrician & Gynecologist Faculty Practice, [facility]  

## 2013-06-25 ENCOUNTER — Encounter: Payer: Medicaid Other | Admitting: Nurse Practitioner

## 2013-06-25 NOTE — ED Provider Notes (Signed)
Medical screening examination/treatment/procedure(s) were performed by non-physician practitioner and as supervising physician I was immediately available for consultation/collaboration.  EKG Interpretation    Date/Time:  Monday June 21 2013 19:14:36 EST Ventricular Rate:  64 PR Interval:  145 QRS Duration: 93 QT Interval:  432 QTC Calculation: 446 R Axis:   65 Text Interpretation:  Sinus rhythm Low voltage, precordial leads ED PHYSICIAN INTERPRETATION AVAILABLE IN [facility] Confirmed by TEST, RECORD ([id number]) on 06/23/2013 7:13:54 AM              Rolland PorterMark Lonny Eisen, MD 06/25/13 [phone]

## 2013-07-29 ENCOUNTER — Encounter: Payer: Medicaid Other | Admitting: Obstetrics and Gynecology

## 2013-08-20 ENCOUNTER — Inpatient Hospital Stay (HOSPITAL_COMMUNITY)
Admission: AD | Admit: 2013-08-20 | Discharge: 2013-08-20 | Disposition: A | Discharge status: Home / Self Care | Payer: Medicaid Other | Source: Ambulatory Visit | Attending: Obstetrics and Gynecology | Admitting: Obstetrics and Gynecology

## 2013-08-20 ENCOUNTER — Encounter (HOSPITAL_COMMUNITY): Payer: Self-pay | Admitting: *Deleted

## 2013-08-20 DIAGNOSIS — R109 Unspecified abdominal pain: Secondary | ICD-10-CM | POA: Insufficient documentation

## 2013-08-20 DIAGNOSIS — N739 Female pelvic inflammatory disease, unspecified: Secondary | ICD-10-CM | POA: Insufficient documentation

## 2013-08-20 DIAGNOSIS — N730 Acute parametritis and pelvic cellulitis: Secondary | ICD-10-CM

## 2013-08-20 MED ORDER — CEFTRIAXONE SODIUM 250 MG IJ SOLR
250.0000 mg | Freq: Once | INTRAMUSCULAR | Status: AC
  Administered 2013-08-20: 250 mg via INTRAMUSCULAR
  Filled 2013-08-20: qty 250

## 2013-08-20 NOTE — MAU Provider Note (Signed)
Ms. Carol Welch is a 38 y.o. [phone] who presents to MAU today for injection for PID treatment. The patient is currently on Flagyl and Doxycycline. The patient denies fever or N/V today. She has had lower abdominal pain which she rates at 6/10 now. She is taking naproxen for pain PRN.   BP 116/68  Pulse 77  Temp(Src) 98.8 F (37.1 C)  Resp 18  Ht 5\' 2"  (1.575 m)  Wt 87.363 kg (192 lb 9.6 oz)  BMI 35.22 kg/m2  SpO2 100%  LMP 07/28/2013 GENERAL: Well-developed, well-nourished female in no acute distress.  HEENT: Normocephalic, atraumatic.   LUNGS: Effort normal HEART: Regular rate  SKIN: Warm, dry and without erythema PSYCH: Normal mood and affect  MDM Discussed patient with Dr. Jackelyn KnifeMeisinger. Oder 250 mg Rocephin. Follow-up in the office  A: PID  P: Discharge home Patient advised to follow-up in the office with Dr. Ellyn HackBovard Patient may return to MAU as needed or if her condition were to change or worsen  Freddi StarrJulie N Ethier, PA-C 08/20/2013 10:14 PM

## 2013-08-20 NOTE — Discharge Instructions (Signed)
Pelvic Inflammatory Disease Pelvic inflammatory disease (PID) is an infection in some or all of the female organs. PID can be in the uterus, ovaries, fallopian tubes, or the surrounding tissues inside the lower belly area (pelvis). HOME CARE   If given, take your antibiotic medicine as told. Finish them even if you start to feel better.  Only take medicine as told by your doctor.  Do not have sex (intercourse) until treatment is done or as told by your doctor.  Tell your sex partner if you have PID. Your partner may need to be treated.  Keep all doctor visits. GET HELP RIGHT AWAY IF:   You have a fever.  You have more belly (abdominal) or lower belly pain.  You have chills.  You have pain when you pee (urinate).  You are not better after 72 hours.  You have more fluid (discharge) coming from your vagina or fluid that is not normal.  You need pain medicine from your doctor.  You throw up (vomit).  You cannot take your medicines.  Your partner has a sexually transmitted disease (STD). MAKE SURE YOU:   Understand these instructions.  Will watch your condition.  Will get help right away if you are not doing well or get worse. Document Released: 07/19/2008 Document Revised: 08/17/2012 Document Reviewed: 04/18/2011 [facility] Patient Information 2014 [employer] [place].

## 2013-08-20 NOTE — Progress Notes (Signed)
Written and verbal d/c instructions given and understanding voiced. To start antibiotics Dr Ellyn HackBovard called in earlier today for pt which pt has picked up. Call office Monday for f/u appt

## 2013-08-20 NOTE — MAU Note (Signed)
[facility] infection in [facility] uterus. I was seen in office Weds and they called me with results and told me to come in for shot.

## 2013-08-20 NOTE — MAU Note (Signed)
Carol FanningJulie Either PA in Triage to see pt as injection given. Pt to call office Monday for f/u appt.

## 2013-12-21 ENCOUNTER — Emergency Department (HOSPITAL_COMMUNITY): Payer: Medicaid Other

## 2013-12-21 ENCOUNTER — Encounter (HOSPITAL_COMMUNITY): Payer: Self-pay | Admitting: Emergency Medicine

## 2013-12-21 ENCOUNTER — Emergency Department (HOSPITAL_COMMUNITY)
Admission: EM | Admit: 2013-12-21 | Discharge: 2013-12-21 | Disposition: A | Discharge status: Home / Self Care | Payer: Medicaid Other | Attending: Emergency Medicine | Admitting: Emergency Medicine

## 2013-12-21 DIAGNOSIS — R064 Hyperventilation: Secondary | ICD-10-CM | POA: Insufficient documentation

## 2013-12-21 DIAGNOSIS — Z7982 Long term (current) use of aspirin: Secondary | ICD-10-CM | POA: Diagnosis not present

## 2013-12-21 DIAGNOSIS — R0602 Shortness of breath: Secondary | ICD-10-CM | POA: Insufficient documentation

## 2013-12-21 DIAGNOSIS — Z791 Long term (current) use of non-steroidal anti-inflammatories (NSAID): Secondary | ICD-10-CM | POA: Insufficient documentation

## 2013-12-21 DIAGNOSIS — R079 Chest pain, unspecified: Secondary | ICD-10-CM | POA: Diagnosis present

## 2013-12-21 DIAGNOSIS — Z8719 Personal history of other diseases of the digestive system: Secondary | ICD-10-CM | POA: Insufficient documentation

## 2013-12-21 DIAGNOSIS — E039 Hypothyroidism, unspecified: Secondary | ICD-10-CM | POA: Insufficient documentation

## 2013-12-21 LAB — I-STAT TROPONIN, ED: Troponin i, poc: 0 ng/mL (ref 0.00–0.08)

## 2013-12-21 LAB — BASIC METABOLIC PANEL
Anion gap: 15 (ref 5–15)
BUN: 16 mg/dL (ref 6–23)
CO2: 22 mEq/L (ref 19–32)
CREATININE: 0.71 mg/dL (ref 0.50–1.10)
Calcium: 8.9 mg/dL (ref 8.4–10.5)
Chloride: 102 mEq/L (ref 96–112)
GFR calc non Af Amer: >90 mL/min (ref >90)
Glucose, Bld: 115 mg/dL — ABNORMAL HIGH (ref 70–99)
Potassium: 3.4 mEq/L — ABNORMAL LOW (ref 3.7–5.3)
SODIUM: 139 meq/L (ref 137–147)

## 2013-12-21 LAB — D-DIMER, QUANTITATIVE: D-Dimer, Quant: 0.51 ug/mL-FEU — ABNORMAL HIGH (ref 0.00–0.48)

## 2013-12-21 LAB — CBC
HCT: 36.2 % (ref 36.0–46.0)
Hemoglobin: 12.4 g/dL (ref 12.0–15.0)
MCH: 28.4 pg (ref 26.0–34.0)
MCHC: 34.3 g/dL (ref 30.0–36.0)
MCV: 82.8 fL (ref 78.0–100.0)
Platelets: 270 10*3/uL (ref 150–400)
RBC: 4.37 MIL/uL (ref 3.87–5.11)
RDW: 14.2 % (ref 11.5–15.5)
WBC: 9 10*3/uL (ref 4.0–10.5)

## 2013-12-21 LAB — PRO B NATRIURETIC PEPTIDE: Pro B Natriuretic peptide (BNP): 40 pg/mL (ref 0–125)

## 2013-12-21 LAB — TROPONIN I: Troponin I: <0.3 ng/mL (ref <0.30)

## 2013-12-21 MED ORDER — ASPIRIN 81 MG PO CHEW
324.0000 mg | CHEWABLE_TABLET | Freq: Once | ORAL | Status: AC
  Administered 2013-12-21: 162 mg via ORAL
  Filled 2013-12-21: qty 4

## 2013-12-21 MED ORDER — NAPROXEN 375 MG PO TABS: 375.0000 mg | ORAL_TABLET | Freq: Two times a day (BID) | ORAL | Status: DC

## 2013-12-21 MED ORDER — MORPHINE SULFATE 4 MG/ML IJ SOLN
4.0000 mg | Freq: Once | INTRAMUSCULAR | Status: AC
  Administered 2013-12-21: 4 mg via INTRAVENOUS
  Filled 2013-12-21: qty 1

## 2013-12-21 MED ORDER — IOHEXOL 350 MG/ML SOLN
100.0000 mL | Freq: Once | INTRAVENOUS | Status: AC | PRN
  Administered 2013-12-21: 100 mL via INTRAVENOUS

## 2013-12-21 NOTE — ED Notes (Signed)
Pt reports mid-cp which started past 1700.  Pt reports pain is making her have a hard time breathing.

## 2013-12-21 NOTE — ED Provider Notes (Signed)
CSN: [id number]     Arrival date & time 12/21/13  1812 History   First MD Initiated Contact with Patient 12/21/13 1837     Chief Complaint  Patient presents with  . Chest Pain  . Shortness of Breath     (Consider location/radiation/quality/duration/timing/severity/associated sxs/prior Treatment) HPI Comments: Patient presents with chest pain. She states she had an onset of sharp intense chest pain across her sternum it started at 4:30 that afternoon. She states she was sitting in chair talking to her friend when it started. She does have some associated shortness of breath. She states it is sharp pain is gone away but now she is a tightness to the center of her chest. It's otherwise nonradiating. She has some nausea but no diaphoresis. She denies any known history of heart problems. She took one baby aspirin at home with some mild improvement in symptoms. She has no history of hypertension, hyperlipidemia diabetes or tobacco use. She states her father had a heart attack when he was in his 7840s.  Patient is a 38 y.o. female presenting with chest pain and shortness of breath.  Chest Pain Associated symptoms: shortness of breath   Associated symptoms: no abdominal pain, no back pain, no cough, no diaphoresis, no dizziness, no fatigue, no fever, no headache, no nausea, no numbness, not vomiting and no weakness   Shortness of Breath Associated symptoms: chest pain   Associated symptoms: no abdominal pain, no cough, no diaphoresis, no fever, no headaches, no rash and no vomiting     Past Medical History  Diagnosis Date  . Colitis   . Hypothyroid    Past Surgical History  Procedure Laterality Date  . No past surgeries     Family History  Problem Relation Age of Onset  . Heart disease Father   . Cancer Maternal Aunt   . Cancer Maternal Grandmother   . Heart disease Paternal Grandfather    History  Substance Use Topics  . Smoking status: Never Smoker   . Smokeless tobacco: Never Used   . Alcohol Use: No   OB History   Grav Para Term Preterm Abortions TAB SAB Ect Mult Living   4 3 3  1  1   3      Review of Systems  Constitutional: Negative for fever, chills, diaphoresis and fatigue.  HENT: Negative for congestion, rhinorrhea and sneezing.   Eyes: Negative.   Respiratory: Positive for shortness of breath. Negative for cough and chest tightness.   Cardiovascular: Positive for chest pain. Negative for leg swelling.  Gastrointestinal: Negative for nausea, vomiting, abdominal pain, diarrhea and blood in stool.  Genitourinary: Negative for frequency, hematuria, flank pain and difficulty urinating.  Musculoskeletal: Negative for arthralgias and back pain.  Skin: Negative for rash.  Neurological: Negative for dizziness, speech difficulty, weakness, numbness and headaches.      Allergies  Review of patient's allergies indicates no known allergies.  Home Medications   Prior to Admission medications   Medication Sig Start Date End Date Taking? Authorizing Provider  aspirin EC 325 MG tablet Take 1 tablet (325 mg total) by mouth daily. 10/26/12  Yes Derwood KaplanAnkit Nanavati, MD  levothyroxine (SYNTHROID, LEVOTHROID) 100 MCG tablet Take 100 mcg by mouth daily before breakfast.    Yes Historical Provider, MD  naproxen (NAPROSYN) 375 MG tablet Take 1 tablet (375 mg total) by mouth 2 (two) times daily. 12/21/13   Rolan BuccoMelanie Colvin Blatt, MD   BP 112/65  Pulse 78  Temp(Src) 98.4 F (36.9 C) (Oral)  Resp 12  SpO2 100%  LMP 12/12/2013 Physical Exam  Constitutional: She is oriented to person, place, and time. She appears well-developed and well-nourished.  Patient talks in full sentences with no increased work of breathing but she does seem to be hyperventilating.  HENT:  Head: Normocephalic and atraumatic.  Eyes: Pupils are equal, round, and reactive to light.  Neck: Normal range of motion. Neck supple.  Cardiovascular: Normal rate, regular rhythm and normal heart sounds.   Pulmonary/Chest:  Effort normal and breath sounds normal. No respiratory distress. She has no wheezes. She has no rales. She exhibits tenderness (reproducible pain  across the lower sternum).  Abdominal: Soft. Bowel sounds are normal. There is no tenderness. There is no rebound and no guarding.  Musculoskeletal: Normal range of motion. She exhibits no edema.  No calf tenderness  Lymphadenopathy:    She has no cervical adenopathy.  Neurological: She is alert and oriented to person, place, and time.  Skin: Skin is warm and dry. No rash noted.  Psychiatric: She has a normal mood and affect.    ED Course  Procedures (including critical care time) Labs Review Results for orders placed during the hospital encounter of 12/21/13  CBC      Result Value Ref Range   WBC 9.0  4.0 - 10.5 K/uL   RBC 4.37  3.87 - 5.11 MIL/uL   Hemoglobin 12.4  12.0 - 15.0 g/dL   HCT 16.1  09.6 - 04.5 %   MCV 82.8  78.0 - 100.0 fL   MCH 28.4  26.0 - 34.0 pg   MCHC 34.3  30.0 - 36.0 g/dL   RDW 40.9  81.1 - 91.4 %   Platelets 270  150 - 400 K/uL  BASIC METABOLIC PANEL      Result Value Ref Range   Sodium 139  137 - 147 mEq/L   Potassium 3.4 (*) 3.7 - 5.3 mEq/L   Chloride 102  96 - 112 mEq/L   CO2 22  19 - 32 mEq/L   Glucose, Bld 115 (*) 70 - 99 mg/dL   BUN 16  6 - 23 mg/dL   Creatinine, Ser 7.82  0.50 - 1.10 mg/dL   Calcium 8.9  8.4 - 95.6 mg/dL   GFR calc non Af Amer >90  >90 mL/min   GFR calc Af Amer >90  >90 mL/min   Anion gap 15  5 - 15  PRO B NATRIURETIC PEPTIDE      Result Value Ref Range   Pro B Natriuretic peptide (BNP) 40.0  0 - 125 pg/mL  D-DIMER, QUANTITATIVE      Result Value Ref Range   D-Dimer, Quant 0.51 (*) 0.00 - 0.48 ug/mL-FEU  TROPONIN I      Result Value Ref Range   Troponin I <0.30  <0.30 ng/mL  I-STAT TROPOININ, ED      Result Value Ref Range   Troponin i, poc 0.00  0.00 - 0.08 ng/mL   Comment 3            Dg Chest Port 1 View  12/21/2013   CLINICAL DATA:  CHEST PAIN SHORTNESS OF BREATH  EXAM:  PORTABLE CHEST - 1 VIEW  COMPARISON:  05/18/2013  FINDINGS: The heart size and mediastinal contours are within normal limits. Both lungs are clear. The visualized skeletal structures are unremarkable.  IMPRESSION: No active disease.   Electronically Signed   By: Oley Balm M.D.   On: 12/21/2013 19:18  Date: 12/21/2013  Rate: 99  Rhythm: normal sinus rhythm  QRS Axis: normal  Intervals: normal  ST/T Wave abnormalities: normal  Conduction Disutrbances:none  Narrative Interpretation:   Old EKG Reviewed: unchanged   Imaging Review Ct Angio Chest Pe W/cm &/or Wo Cm  12/21/2013   CLINICAL DATA:  Sudden onset of chest pain.  EXAM: CT ANGIOGRAPHY CHEST WITH CONTRAST  TECHNIQUE: Multidetector CT imaging of the chest was performed using the standard protocol during bolus administration of intravenous contrast. Multiplanar CT image reconstructions and MIPs were obtained to evaluate the vascular anatomy.  CONTRAST:  OMNIPAQUE IOHEXOL 350 MG/ML SOLN  COMPARISON:  12/21/2013 conventional radiographs  FINDINGS: No filling defect is identified in the pulmonary arterial tree to suggest pulmonary embolus. No acute aortic finding, although the contrast medium in the systemic arterial system is dilute during scanning of the upper chest. No pathologic thoracic adenopathy. Sternoclavicular articulations and sternum appear unremarkable.  No pleural or pericardial effusion. The lungs appear clear. No acute bony findings. Visualized thoracic spine unremarkable.  Review of the MIP images confirms the above findings.  IMPRESSION: 1. No specific abnormality to account for the patient's chest pain. No embolus identified.   Electronically Signed   By: Herbie Baltimore M.D.   On: 12/21/2013 20:47   Dg Chest Port 1 View  12/21/2013   CLINICAL DATA:  CHEST PAIN SHORTNESS OF BREATH  EXAM: PORTABLE CHEST - 1 VIEW  COMPARISON:  05/18/2013  FINDINGS: The heart size and mediastinal contours are within normal limits.  Both lungs are clear. The visualized skeletal structures are unremarkable.  IMPRESSION: No active disease.   Electronically Signed   By: Oley Balm M.D.   On: 12/21/2013 19:18     EKG Interpretation None      MDM   Final diagnoses:  Chest pain, unspecified chest pain type    Patient presents with chest pain. It's reproducible on palpation. She's had negative delta troponins. Her EKG did not show ischemic changes. She has a heart score of 2 which is low risk. Her CT scan was negative for PE. Given this I feel that she can be discharged home and close outpatient followup. I encouraged her followup with her primary care physician within the next 2 days or return here as needed if her symptoms worsen.    Rolan Bucco, MD 12/21/13 2224
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
## 2013-12-21 NOTE — Discharge Instructions (Signed)

## 2014-03-07 ENCOUNTER — Encounter (HOSPITAL_COMMUNITY): Payer: Self-pay | Admitting: Emergency Medicine

## 2014-05-07 DIAGNOSIS — Z8719 Personal history of other diseases of the digestive system: Secondary | ICD-10-CM | POA: Insufficient documentation

## 2014-05-07 DIAGNOSIS — E039 Hypothyroidism, unspecified: Secondary | ICD-10-CM | POA: Insufficient documentation

## 2014-05-07 DIAGNOSIS — Z79899 Other long term (current) drug therapy: Secondary | ICD-10-CM | POA: Insufficient documentation

## 2014-05-07 DIAGNOSIS — R21 Rash and other nonspecific skin eruption: Secondary | ICD-10-CM | POA: Insufficient documentation

## 2014-05-08 ENCOUNTER — Emergency Department (HOSPITAL_COMMUNITY)
Admission: EM | Admit: 2014-05-08 | Discharge: 2014-05-08 | Disposition: A | Discharge status: Home / Self Care | Payer: Medicaid Other | Attending: Emergency Medicine | Admitting: Emergency Medicine

## 2014-05-08 ENCOUNTER — Encounter (HOSPITAL_COMMUNITY): Payer: Self-pay | Admitting: Emergency Medicine

## 2014-05-08 DIAGNOSIS — R21 Rash and other nonspecific skin eruption: Secondary | ICD-10-CM

## 2014-05-08 MED ORDER — FAMOTIDINE 20 MG PO TABS: 20.0000 mg | ORAL_TABLET | Freq: Two times a day (BID) | ORAL | Status: DC

## 2014-05-08 MED ORDER — DIPHENHYDRAMINE HCL 25 MG PO CAPS
50.0000 mg | ORAL_CAPSULE | Freq: Once | ORAL | Status: AC
  Administered 2014-05-08: 50 mg via ORAL
  Filled 2014-05-08: qty 2

## 2014-05-08 MED ORDER — DIPHENHYDRAMINE HCL 25 MG PO TABS: 25.0000 mg | ORAL_TABLET | Freq: Four times a day (QID) | ORAL | Status: DC

## 2014-05-08 MED ORDER — FAMOTIDINE 20 MG PO TABS
20.0000 mg | ORAL_TABLET | Freq: Once | ORAL | Status: AC
  Administered 2014-05-08: 20 mg via ORAL
  Filled 2014-05-08: qty 1

## 2014-05-08 MED ORDER — PREDNISONE 20 MG PO TABS
60.0000 mg | ORAL_TABLET | Freq: Once | ORAL | Status: AC
  Administered 2014-05-08: 60 mg via ORAL
  Filled 2014-05-08: qty 3

## 2014-05-08 MED ORDER — PREDNISONE 20 MG PO TABS: ORAL_TABLET | ORAL | Status: DC

## 2014-05-08 NOTE — ED Notes (Signed)
Pt states that she noticed a fine rash on her neck and upper chest on Wednesday that has now spread across her abdomen and extremities. Pt states the rash itches and that she has been taking Benadryl and Claritin with no relief.

## 2014-05-08 NOTE — ED Provider Notes (Signed)
CSN: [id number]     Arrival date & time 05/07/14  2358 History   First MD Initiated Contact with Patient 05/08/14 0036     Chief Complaint  Patient presents with  . Rash     (Consider location/radiation/quality/duration/timing/severity/associated sxs/prior Treatment) HPI   39 year old female presents with a gradual onset of diffuse rash which started about 4 days ago. Patient notice a rash that started at her neck area which has now spread across her extremities and her abdomen. Rash is extremely itchy, not improving with taking Claritin and Benadryl. No associated fever, chills, headache, throat swelling, chest pain, shortness of breath, abdominal cramping, vomiting or diarrhea. She denies any recent changes in soap, detergent, new pets or medication. No one else at home with similar rash. No recent sick contact or recent viral infection. Given the duration of the symptom, patient decided to come for further evaluation. No history of diabetes.  Past Medical History  Diagnosis Date  . Colitis   . Hypothyroid    Past Surgical History  Procedure Laterality Date  . No past surgeries     Family History  Problem Relation Age of Onset  . Heart disease Father   . Cancer Maternal Aunt   . Cancer Maternal Grandmother   . Heart disease Paternal Grandfather    History  Substance Use Topics  . Smoking status: Never Smoker   . Smokeless tobacco: Never Used  . Alcohol Use: No   OB History    Gravida Para Term Preterm AB TAB SAB Ectopic Multiple Living   Review of Systems  All other systems reviewed and are negative.     Allergies  Review of patient's allergies indicates no known allergies.  Home Medications   Prior to Admission medications   Medication Sig Start Date End Date Taking? Authorizing Provider  diphenhydrAMINE (BENADRYL) 25 MG tablet Take 25 mg by mouth every 6 (six) hours as needed for itching or allergies.   Yes Historical Provider, MD   levothyroxine (SYNTHROID, LEVOTHROID) 100 MCG tablet Take 100 mcg by mouth daily before breakfast.    Yes Historical Provider, MD  loratadine (CLARITIN) 10 MG tablet Take 10 mg by mouth daily.   Yes Historical Provider, MD  aspirin EC 325 MG tablet Take 1 tablet (325 mg total) by mouth daily. Patient not taking: Reported on 05/08/2014 10/26/12   Derwood Kaplan, MD  naproxen (NAPROSYN) 375 MG tablet Take 1 tablet (375 mg total) by mouth 2 (two) times daily. Patient not taking: Reported on 05/08/2014 12/21/13   Rolan Bucco, MD   BP 124/64 mmHg  Pulse 75  Temp(Src) 97.6 F (36.4 C) (Oral)  Resp 18  Ht  (1.575 m)  Wt 178 lb (80.74 kg)  BMI 32.55 kg/m2  SpO2 100%  LMP 04/29/2014 (Exact Date) Physical Exam  Constitutional: She is oriented to person, place, and time. She appears well-developed and well-nourished. No distress.  HENT:  Head: Atraumatic.  Mouth/Throat: Oropharynx is clear and moist.  Eyes: Conjunctivae are normal.  Neck: Neck supple.  Cardiovascular: Normal rate and regular rhythm.   Pulmonary/Chest: Effort normal and breath sounds normal. No respiratory distress. She has no wheezes.  Abdominal: Soft. There is no tenderness.  Neurological: She is alert and oriented to person, place, and time.  Skin: No rash (fine sand-like blanchable erythematous rash noted to neck, chest, back, and upper extremities without pus/vesicle/petechiae lesions. no rash in oral mucosa  or webs of fingers) noted.  Psychiatric: She has a normal mood and affect.  Nursing note and vitals reviewed.   ED Course  Procedures (including critical care time)  12:42 AM Patient has a fine sand-like rash throughout her upper body. No red flags. No evidence to suggest anaphylactic reaction. No specific causative agent. No history of diabetes. Plan to treat with steroid, Benadryl, and Pepcid. Strict return precautions discussed. Patient voiced understanding and agrees with plan.  Labs Review Labs Reviewed -  No data to display  Imaging Review No results found.   EKG Interpretation None      MDM   Final diagnoses:  Rash and nonspecific skin eruption    BP 124/64 mmHg  Pulse 75  Temp(Src) 97.6 F (36.4 C) (Oral)  Resp 18  Ht  (1.575 m)  Wt 178 lb (80.74 kg)  BMI 32.55 kg/m2  SpO2 100%  LMP 04/29/2014 (Exact Date)     Fayrene Helper, PA-C 05/08/14 0044  Olivia Mackie, MD 05/08/14 [phone]

## 2014-05-08 NOTE — Discharge Instructions (Signed)

## 2014-05-24 ENCOUNTER — Emergency Department (HOSPITAL_COMMUNITY): Payer: Medicaid Other

## 2014-05-24 ENCOUNTER — Encounter (HOSPITAL_COMMUNITY): Payer: Self-pay | Admitting: Emergency Medicine

## 2014-05-24 ENCOUNTER — Emergency Department (HOSPITAL_COMMUNITY): Payer: Self-pay

## 2014-05-24 ENCOUNTER — Emergency Department (HOSPITAL_COMMUNITY)
Admission: EM | Admit: 2014-05-24 | Discharge: 2014-05-24 | Disposition: A | Discharge status: Home / Self Care | Payer: Self-pay | Attending: Emergency Medicine | Admitting: Emergency Medicine

## 2014-05-24 DIAGNOSIS — R110 Nausea: Secondary | ICD-10-CM | POA: Insufficient documentation

## 2014-05-24 DIAGNOSIS — Z79899 Other long term (current) drug therapy: Secondary | ICD-10-CM | POA: Insufficient documentation

## 2014-05-24 DIAGNOSIS — Z3202 Encounter for pregnancy test, result negative: Secondary | ICD-10-CM | POA: Insufficient documentation

## 2014-05-24 DIAGNOSIS — R109 Unspecified abdominal pain: Secondary | ICD-10-CM

## 2014-05-24 DIAGNOSIS — Z8719 Personal history of other diseases of the digestive system: Secondary | ICD-10-CM | POA: Insufficient documentation

## 2014-05-24 DIAGNOSIS — E039 Hypothyroidism, unspecified: Secondary | ICD-10-CM | POA: Insufficient documentation

## 2014-05-24 DIAGNOSIS — R1032 Left lower quadrant pain: Secondary | ICD-10-CM | POA: Insufficient documentation

## 2014-05-24 LAB — CBC WITH DIFFERENTIAL/PLATELET
BASOS ABS: 0 10*3/uL (ref 0.0–0.1)
Basophils Relative: 0 % (ref 0–1)
Eosinophils Absolute: 0.1 10*3/uL (ref 0.0–0.7)
Eosinophils Relative: 1 % (ref 0–5)
HCT: 38.7 % (ref 36.0–46.0)
Hemoglobin: 13 g/dL (ref 12.0–15.0)
LYMPHS ABS: 2.5 10*3/uL (ref 0.7–4.0)
Lymphocytes Relative: 30 % (ref 12–46)
MCH: 28.4 pg (ref 26.0–34.0)
MCHC: 33.6 g/dL (ref 30.0–36.0)
MCV: 84.5 fL (ref 78.0–100.0)
MONO ABS: 0.5 10*3/uL (ref 0.1–1.0)
MONOS PCT: 6 % (ref 3–12)
NEUTROS ABS: 5.2 10*3/uL (ref 1.7–7.7)
Neutrophils Relative %: 63 % (ref 43–77)
Platelets: 266 10*3/uL (ref 150–400)
RBC: 4.58 MIL/uL (ref 3.87–5.11)
RDW: 14.5 % (ref 11.5–15.5)
WBC: 8.4 10*3/uL (ref 4.0–10.5)

## 2014-05-24 LAB — URINALYSIS, ROUTINE W REFLEX MICROSCOPIC
Bilirubin Urine: NEGATIVE
Glucose, UA: NEGATIVE mg/dL
Hgb urine dipstick: NEGATIVE
Ketones, ur: NEGATIVE mg/dL
LEUKOCYTES UA: NEGATIVE
Nitrite: NEGATIVE
PH: 6.5 (ref 5.0–8.0)
PROTEIN: NEGATIVE mg/dL
SPECIFIC GRAVITY, URINE: 1.011 (ref 1.005–1.030)
UROBILINOGEN UA: 0.2 mg/dL (ref 0.0–1.0)

## 2014-05-24 LAB — COMPREHENSIVE METABOLIC PANEL
ALBUMIN: 3.9 g/dL (ref 3.5–5.2)
ALT: 12 U/L (ref 0–35)
AST: 21 U/L (ref 0–37)
Alkaline Phosphatase: 57 U/L (ref 39–117)
Anion gap: 6 (ref 5–15)
BUN: 15 mg/dL (ref 6–23)
CHLORIDE: 105 meq/L (ref 96–112)
CO2: 26 mmol/L (ref 19–32)
CREATININE: 0.65 mg/dL (ref 0.50–1.10)
Calcium: 8.6 mg/dL (ref 8.4–10.5)
GFR calc non Af Amer: >90 mL/min (ref >90)
Glucose, Bld: 121 mg/dL — ABNORMAL HIGH (ref 70–99)
Potassium: 4.3 mmol/L (ref 3.5–5.1)
Sodium: 137 mmol/L (ref 135–145)
TOTAL PROTEIN: 7.2 g/dL (ref 6.0–8.3)
Total Bilirubin: 0.3 mg/dL (ref 0.3–1.2)

## 2014-05-24 LAB — LIPASE, BLOOD: LIPASE: 28 U/L (ref 11–59)

## 2014-05-24 LAB — POC URINE PREG, ED: Preg Test, Ur: NEGATIVE

## 2014-05-24 MED ORDER — HYDROMORPHONE HCL 1 MG/ML IJ SOLN
1.0000 mg | Freq: Once | INTRAMUSCULAR | Status: AC
  Administered 2014-05-24: 1 mg via INTRAVENOUS
  Filled 2014-05-24: qty 1

## 2014-05-24 MED ORDER — IOHEXOL 300 MG/ML  SOLN
100.0000 mL | Freq: Once | INTRAMUSCULAR | Status: AC | PRN
  Administered 2014-05-24: 100 mL via INTRAVENOUS

## 2014-05-24 MED ORDER — ONDANSETRON HCL 4 MG/2ML IJ SOLN
4.0000 mg | Freq: Once | INTRAMUSCULAR | Status: AC
  Administered 2014-05-24: 4 mg via INTRAVENOUS
  Filled 2014-05-24: qty 2

## 2014-05-24 MED ORDER — IOHEXOL 300 MG/ML  SOLN
25.0000 mL | Freq: Once | INTRAMUSCULAR | Status: AC | PRN
  Administered 2014-05-24: 25 mL via ORAL

## 2014-05-24 MED ORDER — ONDANSETRON 4 MG PO TBDP: ORAL_TABLET | ORAL | Status: DC

## 2014-05-24 MED ORDER — OXYCODONE-ACETAMINOPHEN 5-325 MG PO TABS: 1.0000 | ORAL_TABLET | ORAL | Status: DC | PRN

## 2014-05-24 NOTE — ED Notes (Signed)
Pt reports pain and nausea are coming back

## 2014-05-24 NOTE — ED Notes (Signed)
Pt c/o LLQ intermittent abdominal pain and nausea onset 3 hours ago while ambulating.

## 2014-05-24 NOTE — ED Notes (Signed)
Ultrasound tech at bedside

## 2014-05-24 NOTE — Discharge Instructions (Signed)
Abdominal Pain, Women °Abdominal (stomach, pelvic, or belly) pain can be caused by many things. It is important to tell your doctor: °· The location of the pain. °· Does it come and go or is it present all the time? °· Are there things that start the pain (eating certain foods, exercise)? °· Are there other symptoms associated with the pain (fever, nausea, vomiting, diarrhea)? °All of this is helpful to know when trying to find the cause of the pain. °CAUSES  °· Stomach: virus or bacteria infection, or ulcer. °· Intestine: appendicitis (inflamed appendix), regional ileitis (Crohn's disease), ulcerative colitis (inflamed colon), irritable bowel syndrome, diverticulitis (inflamed diverticulum of the colon), or cancer of the stomach or intestine. °· Gallbladder disease or stones in the gallbladder. °· Kidney disease, kidney stones, or infection. °· Pancreas infection or cancer. °· Fibromyalgia (pain disorder). °· Diseases of the female organs: °¨ Uterus: fibroid (non-cancerous) tumors or infection. °¨ Fallopian tubes: infection or tubal pregnancy. °¨ Ovary: cysts or tumors. °¨ Pelvic adhesions (scar tissue). °¨ Endometriosis (uterus lining tissue growing in the pelvis and on the pelvic organs). °¨ Pelvic congestion syndrome (female organs filling up with blood just before the menstrual period). °¨ Pain with the menstrual period. °¨ Pain with ovulation (producing an egg). °¨ Pain with an IUD (intrauterine device, birth control) in the uterus. °¨ Cancer of the female organs. °· Functional pain (pain not caused by a disease, may improve without treatment). °· Psychological pain. °· Depression. °DIAGNOSIS  °Your doctor will decide the seriousness of your pain by doing an examination. °· Blood tests. °· X-rays. °· Ultrasound. °· CT scan (computed tomography, special type of X-ray). °· MRI (magnetic resonance imaging). °· Cultures, for infection. °· Barium enema (dye inserted in the large intestine, to better view it with  X-rays). °· Colonoscopy (looking in intestine with a lighted tube). °· Laparoscopy (minor surgery, looking in abdomen with a lighted tube). °· Major abdominal exploratory surgery (looking in abdomen with a large incision). °TREATMENT  °The treatment will depend on the cause of the pain.  °· Many cases can be observed and treated at home. °· Over-the-counter medicines recommended by your caregiver. °· Prescription medicine. °· Antibiotics, for infection. °· Birth control pills, for painful periods or for ovulation pain. °· Hormone treatment, for endometriosis. °· Nerve blocking injections. °· Physical therapy. °· Antidepressants. °· Counseling with a psychologist or psychiatrist. °· Minor or major surgery. °HOME CARE INSTRUCTIONS  °· Do not take laxatives, unless directed by your caregiver. °· Take over-the-counter pain medicine only if ordered by your caregiver. Do not take aspirin because it can cause an upset stomach or bleeding. °· Try a clear liquid diet (broth or water) as ordered by your caregiver. Slowly move to a bland diet, as tolerated, if the pain is related to the stomach or intestine. °· Have a thermometer and take your temperature several times a day, and record it. °· Bed rest and sleep, if it helps the pain. °· Avoid sexual intercourse, if it causes pain. °· Avoid stressful situations. °· Keep your follow-up appointments and tests, as your caregiver orders. °· If the pain does not go away with medicine or surgery, you may try: °¨ Acupuncture. °¨ Relaxation exercises (yoga, meditation). °¨ Group therapy. °¨ Counseling. °SEEK MEDICAL CARE IF:  °· You notice certain foods cause stomach pain. °· Your home care treatment is not helping your pain. °· You need stronger pain medicine. °· You want your IUD removed. °· You feel faint or   lightheaded. °· You develop nausea and vomiting. °· You develop a rash. °· You are having side effects or an allergy to your medicine. °SEEK IMMEDIATE MEDICAL CARE IF:  °· Your  pain does not go away or gets worse. °· You have a fever. °· Your pain is felt only in portions of the abdomen. The right side could possibly be appendicitis. The left lower portion of the abdomen could be colitis or diverticulitis. °· You are passing blood in your stools (bright red or black tarry stools, with or without vomiting). °· You have blood in your urine. °· You develop chills, with or without a fever. °· You pass out. °MAKE SURE YOU:  °· Understand these instructions. °· Will watch your condition. °· Will get help right away if you are not doing well or get worse. °Document Released: 02/17/2007 Document Revised: 09/06/2013 Document Reviewed: 03/09/2009 °[employer]® Patient Information ©2015 [employer]. This information is not intended to replace advice given to you by your health care provider. Make sure you discuss any questions you have with your health care provider. ° °

## 2014-05-24 NOTE — ED Provider Notes (Signed)
CSN: [id number]     Arrival date & time 05/24/14  1741 History   First MD Initiated Contact with Patient 05/24/14 1753     Chief Complaint  Patient presents with  . Abdominal Pain     (Consider location/radiation/quality/duration/timing/severity/associated sxs/prior Treatment) Patient is a 39 y.o. female presenting with abdominal pain.  Abdominal Pain Pain location:  LLQ Pain quality: cramping and sharp   Pain radiates to:  Does not radiate Pain severity:  Severe Onset quality:  Sudden Duration:  2 hours Timing:  Constant Progression:  Waxing and waning Chronicity:  New Context: not previous surgeries and not recent illness   Relieved by:  Nothing Worsened by:  Nothing tried Ineffective treatments:  None tried Associated symptoms: nausea   Associated symptoms: no belching, no constipation, no diarrhea, no fever and no vomiting     Past Medical History  Diagnosis Date  . Colitis   . Hypothyroid    Past Surgical History  Procedure Laterality Date  . No past surgeries     Family History  Problem Relation Age of Onset  . Heart disease Father   . Cancer Maternal Aunt   . Cancer Maternal Grandmother   . Heart disease Paternal Grandfather    History  Substance Use Topics  . Smoking status: Never Smoker   . Smokeless tobacco: Never Used  . Alcohol Use: No   OB History    Gravida Para Term Preterm AB TAB SAB Ectopic Multiple Living   Review of Systems  Constitutional: Negative for fever.  Gastrointestinal: Positive for nausea and abdominal pain. Negative for vomiting, diarrhea and constipation.  All other systems reviewed and are negative.     Allergies  Review of patient's allergies indicates no known allergies.  Home Medications   Prior to Admission medications   Medication Sig Start Date End Date Taking? Authorizing Provider  levothyroxine (SYNTHROID, LEVOTHROID) 100 MCG tablet Take 100 mcg by mouth daily before breakfast.    Yes  Historical Provider, MD  diphenhydrAMINE (BENADRYL) 25 MG tablet Take 1 tablet (25 mg total) by mouth every 6 (six) hours. Patient not taking: Reported on 05/24/2014 05/08/14   Fayrene Helper, PA-C  famotidine (PEPCID) 20 MG tablet Take 1 tablet (20 mg total) by mouth 2 (two) times daily. Patient not taking: Reported on 05/24/2014 05/08/14   Fayrene Helper, PA-C  loratadine (CLARITIN) 10 MG tablet Take 10 mg by mouth daily.    Historical Provider, MD  ondansetron (ZOFRAN ODT) 4 MG disintegrating tablet  ODT q4 hours prn nausea/vomit 05/24/14   Mirian Mo, MD  oxyCODONE-acetaminophen (PERCOCET/ROXICET) 5-325 MG per tablet Take 1-2 tablets by mouth every 4 (four) hours as needed for severe pain. 05/24/14   Mirian Mo, MD  predniSONE (DELTASONE) 20 MG tablet 3 tabs po day one, then 2 tabs daily x 4 days Patient not taking: Reported on 05/24/2014 05/08/14   Fayrene Helper, PA-C   BP 99/56 mmHg  Pulse 65  Temp(Src) 98.4 F (36.9 C) (Oral)  Resp 18  SpO2 99%  LMP 04/29/2014 (Exact Date) Physical Exam  Constitutional: She is oriented to person, place, and time. She appears well-developed and well-nourished.  HENT:  Head: Normocephalic and atraumatic.  Right Ear: External ear normal.  Left Ear: External ear normal.  Eyes: Conjunctivae and EOM are normal. Pupils are equal, round, and reactive to light.  Neck: Normal range of motion. Neck supple.  Cardiovascular: Normal rate,  regular rhythm, normal heart sounds and intact distal pulses.   Pulmonary/Chest: Effort normal and breath sounds normal.  Abdominal: Soft. Bowel sounds are normal. There is tenderness in the left lower quadrant.  Musculoskeletal: Normal range of motion.  Neurological: She is alert and oriented to person, place, and time.  Skin: Skin is warm and dry.  Vitals reviewed.   ED Course  Procedures (including critical care time) Labs Review Labs Reviewed  COMPREHENSIVE METABOLIC PANEL - Abnormal; Notable for the following:    Glucose,  Bld 121 (*)    All other components within normal limits  CBC WITH DIFFERENTIAL  LIPASE, BLOOD  URINALYSIS, ROUTINE W REFLEX MICROSCOPIC  POC URINE PREG, ED    Imaging Review No results found.   EKG Interpretation None      MDM   Final diagnoses:  Abdominal pain, acute  LLQ abdominal pain  LLQ pain    39 y.o. female with pertinent PMH of prior colitis presents with LLQ pain, sudden onset as above.  Physical exam as above. Wu obtained as above, unremarkable, including TV US with exception of nonspecific endometrial thickening.  DC home in stable condition to fu with womens.   I have reviewed all laboratory and imaging studies if ordered as above  1. Abdominal pain, acute   2. LLQ abdominal pain   3. LLQ pain         Mirian MoMatthew Gentry, MD 05/27/14 [phone]

## 2014-05-30 ENCOUNTER — Emergency Department (HOSPITAL_COMMUNITY)
Admission: EM | Admit: 2014-05-30 | Discharge: 2014-05-30 | Disposition: A | Discharge status: Home / Self Care | Payer: Medicaid Other | Attending: Emergency Medicine | Admitting: Emergency Medicine

## 2014-05-30 ENCOUNTER — Encounter (HOSPITAL_COMMUNITY): Payer: Self-pay | Admitting: Emergency Medicine

## 2014-05-30 DIAGNOSIS — S134XXA Sprain of ligaments of cervical spine, initial encounter: Secondary | ICD-10-CM | POA: Insufficient documentation

## 2014-05-30 DIAGNOSIS — S3992XA Unspecified injury of lower back, initial encounter: Secondary | ICD-10-CM | POA: Insufficient documentation

## 2014-05-30 DIAGNOSIS — Z7952 Long term (current) use of systemic steroids: Secondary | ICD-10-CM | POA: Insufficient documentation

## 2014-05-30 DIAGNOSIS — Y9389 Activity, other specified: Secondary | ICD-10-CM | POA: Insufficient documentation

## 2014-05-30 DIAGNOSIS — E039 Hypothyroidism, unspecified: Secondary | ICD-10-CM | POA: Insufficient documentation

## 2014-05-30 DIAGNOSIS — M545 Low back pain: Secondary | ICD-10-CM

## 2014-05-30 DIAGNOSIS — Z79899 Other long term (current) drug therapy: Secondary | ICD-10-CM | POA: Insufficient documentation

## 2014-05-30 DIAGNOSIS — Y92410 Unspecified street and highway as the place of occurrence of the external cause: Secondary | ICD-10-CM | POA: Insufficient documentation

## 2014-05-30 DIAGNOSIS — Z8719 Personal history of other diseases of the digestive system: Secondary | ICD-10-CM | POA: Insufficient documentation

## 2014-05-30 DIAGNOSIS — S4992XA Unspecified injury of left shoulder and upper arm, initial encounter: Secondary | ICD-10-CM | POA: Insufficient documentation

## 2014-05-30 DIAGNOSIS — Y998 Other external cause status: Secondary | ICD-10-CM | POA: Insufficient documentation

## 2014-05-30 MED ORDER — METHOCARBAMOL 500 MG PO TABS: 500.0000 mg | ORAL_TABLET | Freq: Two times a day (BID) | ORAL | Status: DC

## 2014-05-30 MED ORDER — OXYCODONE-ACETAMINOPHEN 5-325 MG PO TABS: 1.0000 | ORAL_TABLET | Freq: Four times a day (QID) | ORAL | Status: DC | PRN

## 2014-05-30 NOTE — Discharge Instructions (Signed)
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
## 2014-06-01 IMAGING — CT CT ABD-PELV W/ CM
1 of 2 series · 15 of 32 positions shown, 19 images · IV contrast (omnipaque)
Comparison: CT scan dated 06/08/2012. Pelvic ultrasound dated
06/21/2013

CLINICAL DATA: Right lower quadrant and right upper quadrant pain.
Fever last night.

EXAM:
CT ABDOMEN AND PELVIS WITH CONTRAST
TECHNIQUE: Multidetector CT imaging of the abdomen and pelvis was performed
using the standard protocol following bolus administration of
intravenous contrast.
CONTRAST:  50mL OMNIPAQUE IOHEXOL 300 MG/ML SOLN, 100mL OMNIPAQUE
IOHEXOL 300 MG/ML SOLN

[Series 2: abd/pel with · axial · 0.65mm/px · z∈[-418,+6]mm · 15 of 93 slices shown, 19 images]
[im 4/93  soft-tissue]
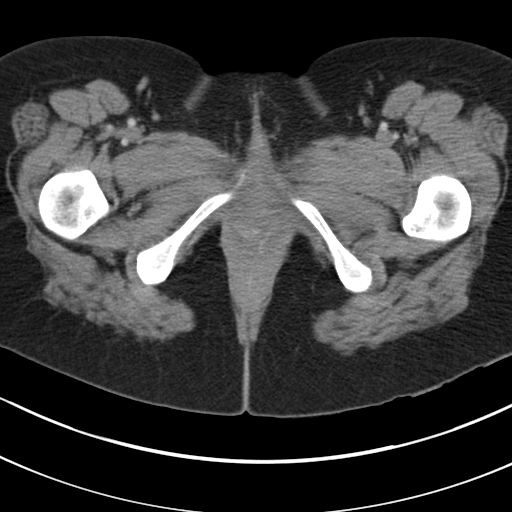
[im 4/93  bone]
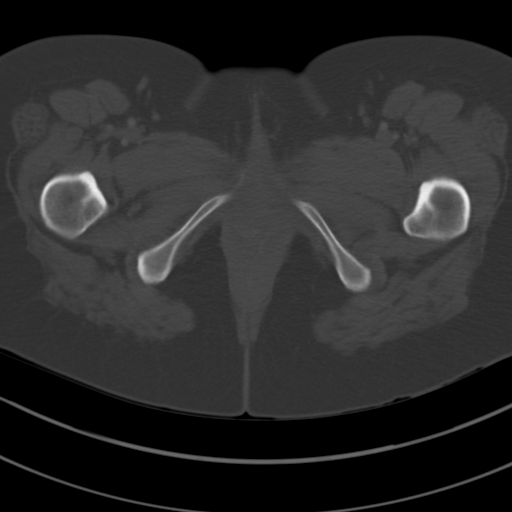
[im 12/93  soft-tissue]
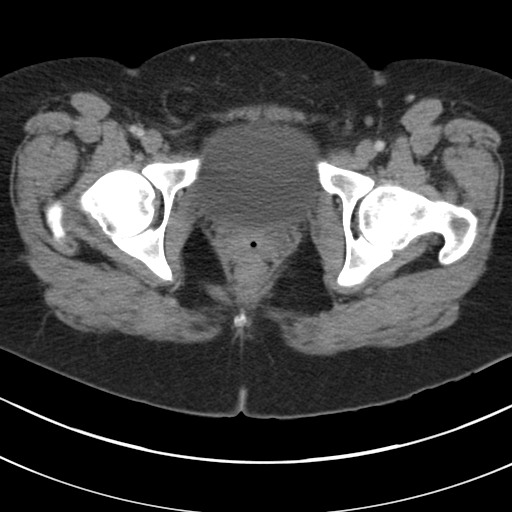
[im 20/93  soft-tissue]
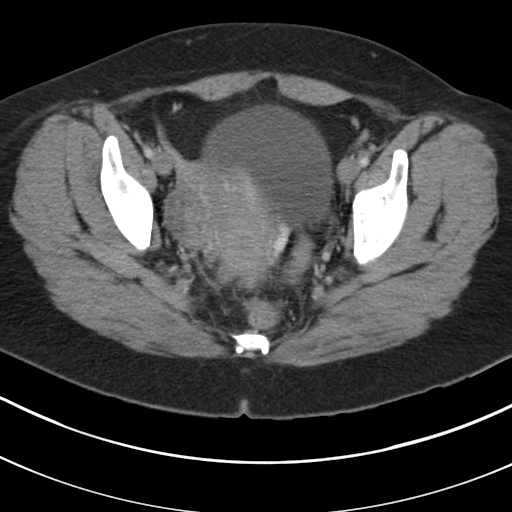
[im 27/93  soft-tissue]
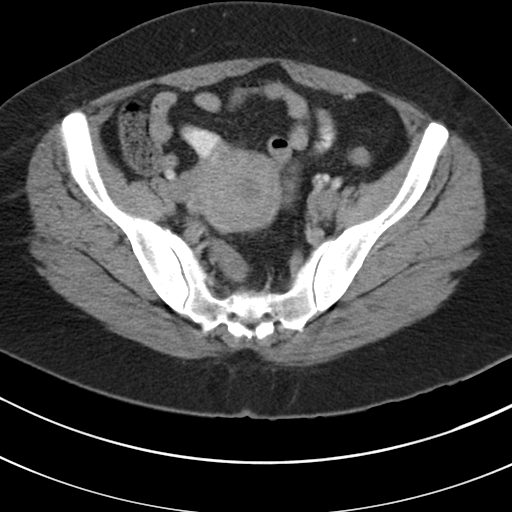
[im 31/93  soft-tissue]
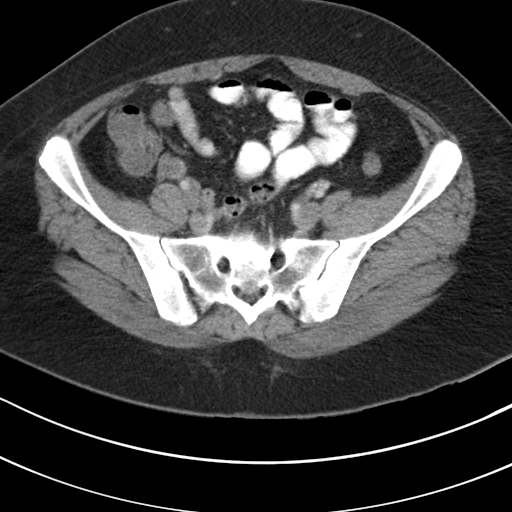
[im 39/93  soft-tissue]
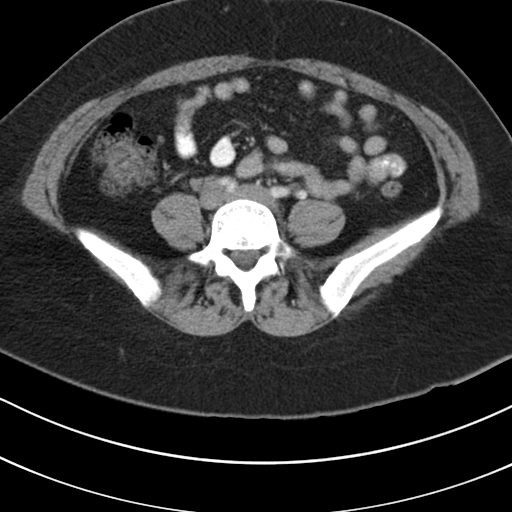
[im 47/93  soft-tissue]
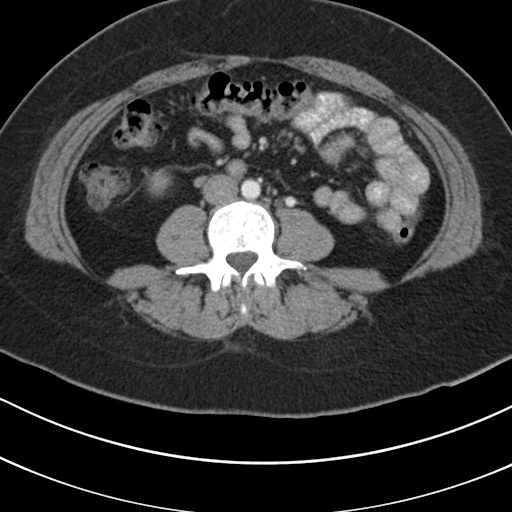
[im 54/93  soft-tissue]
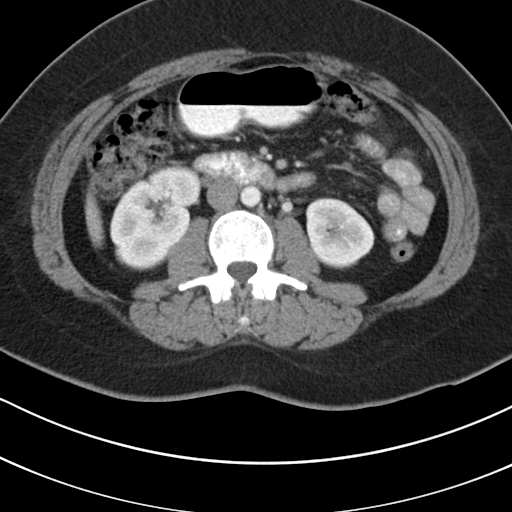
[im 62/93  soft-tissue]
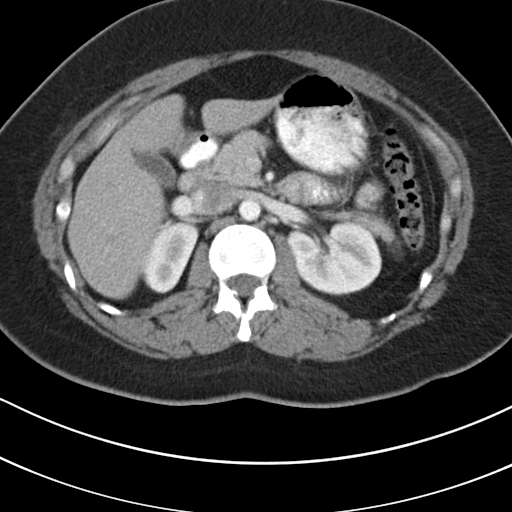
[im 62/93  bone]
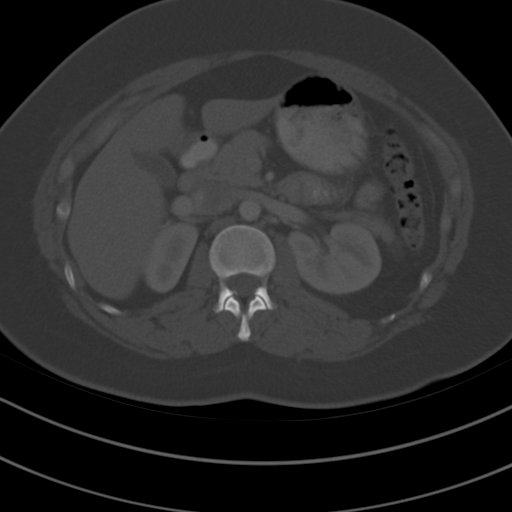
[im 66/93  soft-tissue]
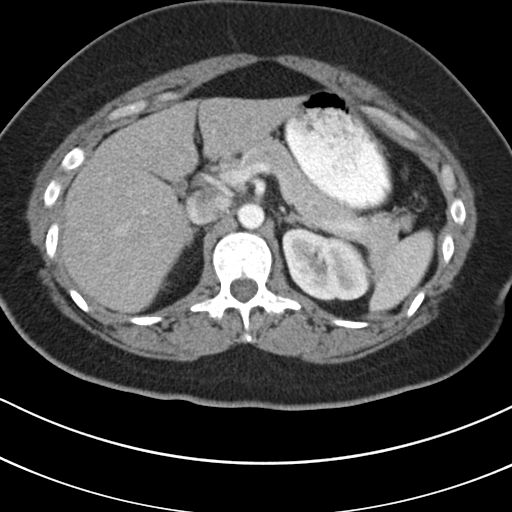
[im 73/93  soft-tissue]
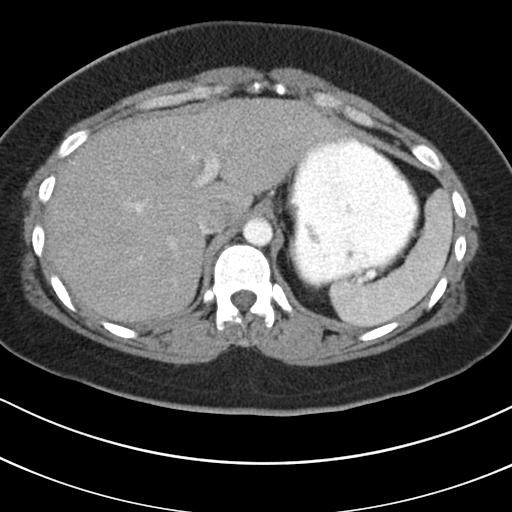
[im 77/93  lung]
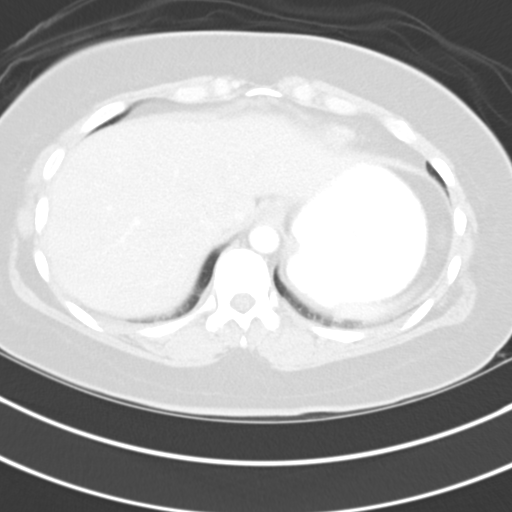
[im 81/93  soft-tissue]
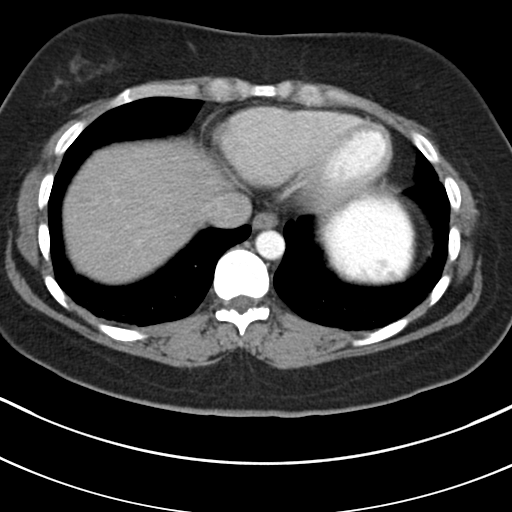
[im 81/93  lung]
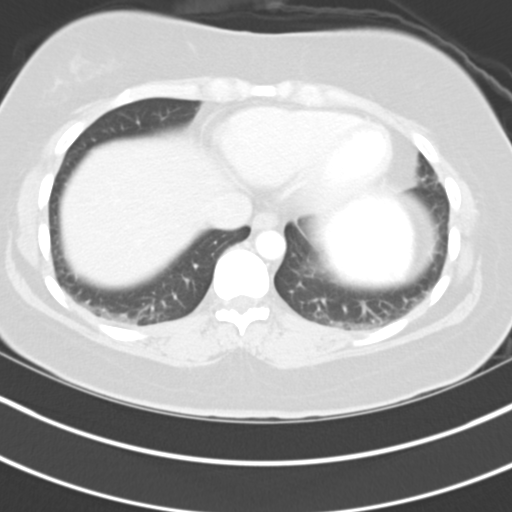
[im 85/93  lung]
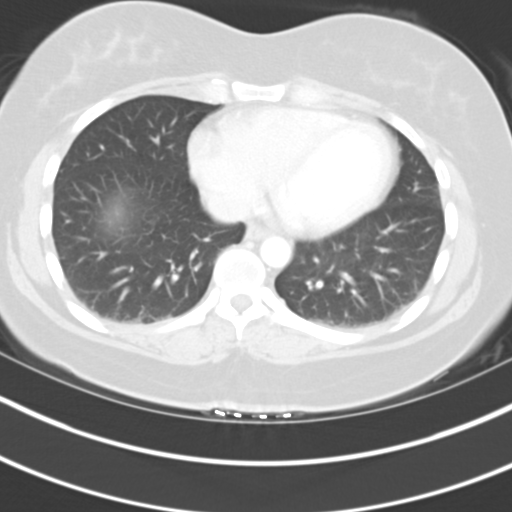
[im 89/93  soft-tissue]
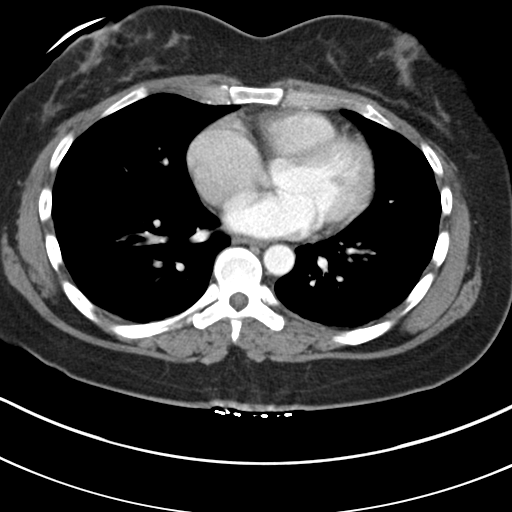
[im 89/93  lung]
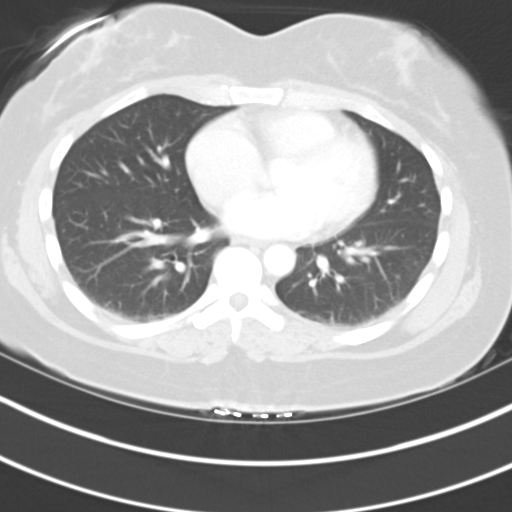

[15 of 32 positions shown; findings below may reference images not displayed]

FINDINGS: There is a partially collapsed benign appearing 17 mm cyst in the
right ovary. Left ovary and uterus are normal.

Liver, biliary tree, spleen, pancreas, adrenal glands, and kidneys
are normal. Bowel is normal including the terminal ileum and
appendix.

There is no free air or free fluid in the abdomen. Osseous
structures are normal. Lung bases are clear.
IMPRESSION: Partially collapsed benign appearing cyst on the right ovary.
Otherwise, normal CT scan of the abdomen and pelvis.

## 2014-06-29 ENCOUNTER — Other Ambulatory Visit (HOSPITAL_COMMUNITY): Payer: Self-pay | Admitting: Orthopaedic Surgery

## 2014-06-29 DIAGNOSIS — M545 Low back pain, unspecified: Secondary | ICD-10-CM

## 2014-07-22 ENCOUNTER — Ambulatory Visit (HOSPITAL_COMMUNITY): Admission: RE | Admit: 2014-07-22 | Payer: No Typology Code available for payment source | Source: Ambulatory Visit

## 2014-11-21 ENCOUNTER — Emergency Department (HOSPITAL_COMMUNITY)
Admission: EM | Admit: 2014-11-21 | Discharge: 2014-11-22 | Disposition: A | Discharge status: Home / Self Care | Payer: Self-pay | Attending: Emergency Medicine | Admitting: Emergency Medicine

## 2014-11-21 ENCOUNTER — Encounter (HOSPITAL_COMMUNITY): Payer: Self-pay

## 2014-11-21 ENCOUNTER — Emergency Department (HOSPITAL_COMMUNITY): Payer: Medicaid Other

## 2014-11-21 DIAGNOSIS — O418X11 Other specified disorders of amniotic fluid and membranes, first trimester, fetus 1: Secondary | ICD-10-CM | POA: Insufficient documentation

## 2014-11-21 DIAGNOSIS — R109 Unspecified abdominal pain: Secondary | ICD-10-CM

## 2014-11-21 DIAGNOSIS — E039 Hypothyroidism, unspecified: Secondary | ICD-10-CM | POA: Insufficient documentation

## 2014-11-21 DIAGNOSIS — O468X1 Other antepartum hemorrhage, first trimester: Secondary | ICD-10-CM

## 2014-11-21 DIAGNOSIS — O99281 Endocrine, nutritional and metabolic diseases complicating pregnancy, first trimester: Secondary | ICD-10-CM | POA: Insufficient documentation

## 2014-11-21 DIAGNOSIS — Z8719 Personal history of other diseases of the digestive system: Secondary | ICD-10-CM | POA: Insufficient documentation

## 2014-11-21 DIAGNOSIS — O418X10 Other specified disorders of amniotic fluid and membranes, first trimester, not applicable or unspecified: Secondary | ICD-10-CM

## 2014-11-21 DIAGNOSIS — N939 Abnormal uterine and vaginal bleeding, unspecified: Secondary | ICD-10-CM

## 2014-11-21 DIAGNOSIS — Z3A01 Less than 8 weeks gestation of pregnancy: Secondary | ICD-10-CM | POA: Insufficient documentation

## 2014-11-21 LAB — URINALYSIS, ROUTINE W REFLEX MICROSCOPIC
Bilirubin Urine: NEGATIVE
Glucose, UA: NEGATIVE mg/dL
Hgb urine dipstick: NEGATIVE
Ketones, ur: NEGATIVE mg/dL
NITRITE: NEGATIVE
Protein, ur: NEGATIVE mg/dL
SPECIFIC GRAVITY, URINE: 1.005 (ref 1.005–1.030)
Urobilinogen, UA: 0.2 mg/dL (ref 0.0–1.0)
pH: 6.5 (ref 5.0–8.0)

## 2014-11-21 LAB — CBC WITH DIFFERENTIAL/PLATELET
Basophils Absolute: 0 10*3/uL (ref 0.0–0.1)
Basophils Relative: 0 % (ref 0–1)
EOS ABS: 0.1 10*3/uL (ref 0.0–0.7)
EOS PCT: 1 % (ref 0–5)
HCT: 35.7 % — ABNORMAL LOW (ref 36.0–46.0)
HEMOGLOBIN: 11.8 g/dL — AB (ref 12.0–15.0)
LYMPHS PCT: 30 % (ref 12–46)
Lymphs Abs: 2.3 10*3/uL (ref 0.7–4.0)
MCH: 28 pg (ref 26.0–34.0)
MCHC: 33.1 g/dL (ref 30.0–36.0)
MCV: 84.8 fL (ref 78.0–100.0)
Monocytes Absolute: 0.5 10*3/uL (ref 0.1–1.0)
Monocytes Relative: 7 % (ref 3–12)
Neutro Abs: 4.7 10*3/uL (ref 1.7–7.7)
Neutrophils Relative %: 62 % (ref 43–77)
PLATELETS: 253 10*3/uL (ref 150–400)
RBC: 4.21 MIL/uL (ref 3.87–5.11)
RDW: 15.4 % (ref 11.5–15.5)
WBC: 7.7 10*3/uL (ref 4.0–10.5)

## 2014-11-21 LAB — COMPREHENSIVE METABOLIC PANEL
ALBUMIN: 3.7 g/dL (ref 3.5–5.0)
ALK PHOS: 44 U/L (ref 38–126)
ALT: 12 U/L — AB (ref 14–54)
AST: 16 U/L (ref 15–41)
Anion gap: 5 (ref 5–15)
BUN: 18 mg/dL (ref 6–20)
CO2: 24 mmol/L (ref 22–32)
Calcium: 8.5 mg/dL — ABNORMAL LOW (ref 8.9–10.3)
Chloride: 108 mmol/L (ref 101–111)
Creatinine, Ser: 0.67 mg/dL (ref 0.44–1.00)
GFR calc Af Amer: >60 mL/min (ref >60)
GFR calc non Af Amer: >60 mL/min (ref >60)
GLUCOSE: 97 mg/dL (ref 65–99)
POTASSIUM: 3.7 mmol/L (ref 3.5–5.1)
Sodium: 137 mmol/L (ref 135–145)
TOTAL PROTEIN: 7 g/dL (ref 6.5–8.1)
Total Bilirubin: 0.2 mg/dL — ABNORMAL LOW (ref 0.3–1.2)

## 2014-11-21 LAB — WET PREP, GENITAL
Clue Cells Wet Prep HPF POC: NONE SEEN
TRICH WET PREP: NONE SEEN
Yeast Wet Prep HPF POC: NONE SEEN

## 2014-11-21 LAB — ABO/RH: ABO/RH(D): A POS

## 2014-11-21 LAB — URINE MICROSCOPIC-ADD ON

## 2014-11-21 LAB — HCG, QUANTITATIVE, PREGNANCY: hCG, Beta Chain, Quant, S: 23345 m[IU]/mL — ABNORMAL HIGH (ref <5)

## 2014-11-21 MED ORDER — ACETAMINOPHEN 325 MG PO TABS
650.0000 mg | ORAL_TABLET | Freq: Once | ORAL | Status: AC
  Administered 2014-11-21: 650 mg via ORAL
  Filled 2014-11-21: qty 2

## 2014-11-21 NOTE — ED Provider Notes (Signed)
CSN: [id number]     Arrival date & time 11/21/14  2049 History   First MD Initiated Contact with Patient 11/21/14 2107     Chief Complaint  Patient presents with  . Abdominal Pain     (Consider location/radiation/quality/duration/timing/severity/associated sxs/prior Treatment) Patient is a 39 y.o. female presenting with abdominal pain. The history is provided by the patient and medical records.  Abdominal Pain Associated symptoms: vaginal bleeding     This is a 30 -year-old G5P4 approx [redacted] weeks gestation with history of colitis and hypothyroidism, presenting to the ED for vaginal bleeding and abdominal cramping.  Patient states over the past 2 weeks she has had intermittent episodes of abdominal cramping.  She is also had nausea without vomiting. No fever or chills. No urinary symptoms. Patient states after she urinated today she wiped and saw bright red blood on the tissue paper.  States since this time she has had increased abdominal cramping localized throughout her entire lower abdomen.  Patient had one miscarriage in the past, no history of ectopic pregnancy.  OB-GYN care at [facility] hospital thus far.  She is currently taking prenatal vitamins. No intervention tried prior to arrival. VSS.  Past Medical History  Diagnosis Date  . Colitis   . Hypothyroid    Past Surgical History  Procedure Laterality Date  . No past surgeries     Family History  Problem Relation Age of Onset  . Heart disease Father   . Cancer Maternal Aunt   . Cancer Maternal Grandmother   . Heart disease Paternal Grandfather    History  Substance Use Topics  . Smoking status: Never Smoker   . Smokeless tobacco: Never Used  . Alcohol Use: No   OB History    Gravida Para Term Preterm AB TAB SAB Ectopic Multiple Living   4 3 3  1  1   3      Review of Systems  Gastrointestinal: Positive for abdominal pain.  Genitourinary: Positive for vaginal bleeding.  All other systems reviewed and are  negative.     Allergies  Review of patient's allergies indicates no known allergies.  Home Medications   Prior to Admission medications   Medication Sig Start Date End Date Taking? Authorizing Provider  acetaminophen (TYLENOL) 500 MG tablet Take 1,000 mg by mouth every 6 (six) hours as needed for moderate pain.   Yes Historical Provider, MD  levothyroxine (SYNTHROID, LEVOTHROID) 100 MCG tablet Take 100 mcg by mouth daily before breakfast.    Yes Historical Provider, MD  loratadine (CLARITIN) 10 MG tablet Take 10 mg by mouth daily.   Yes Historical Provider, MD  Prenatal Vit-Fe Fumarate-FA (PRENATAL VITAMIN PO) Take 1 tablet by mouth every evening.   Yes Historical Provider, MD  methocarbamol (ROBAXIN) 500 MG tablet Take 1 tablet (500 mg total) by mouth 2 (two) times daily. Patient not taking: Reported on 11/21/2014 05/30/14   Fayrene Helper, PA-C  ondansetron (ZOFRAN ODT) 4 MG disintegrating tablet 4mg  ODT q4 hours prn nausea/vomit Patient not taking: Reported on 11/21/2014 05/24/14   Mirian Mo, MD  oxyCODONE-acetaminophen (PERCOCET/ROXICET) 5-325 MG per tablet Take 1 tablet by mouth every 6 (six) hours as needed for severe pain. Patient not taking: Reported on 11/21/2014 05/30/14   Fayrene Helper, PA-C   BP 115/52 mmHg  Pulse 76  Temp(Src) 98.1 F (36.7 C) (Oral)  Resp 18  SpO2 100%  LMP 10/22/2014 (LMP Unknown)   Physical Exam  Constitutional: She is oriented to person, place, and time.  She appears well-developed and well-nourished. No distress.  HENT:  Head: Normocephalic and atraumatic.  Mouth/Throat: Oropharynx is clear and moist.  Eyes: Conjunctivae and EOM are normal. Pupils are equal, round, and reactive to light.  Neck: Normal range of motion. Neck supple.  Cardiovascular: Normal rate, regular rhythm and normal heart sounds.   Pulmonary/Chest: Effort normal and breath sounds normal. No respiratory distress. She has no wheezes.  Abdominal: Soft. Bowel sounds are normal. There  is no tenderness. There is no guarding.  Genitourinary: There is no tenderness on the right labia. There is no tenderness on the left labia. Cervix exhibits no motion tenderness. Right adnexum displays no tenderness. Left adnexum displays no tenderness. No bleeding in the vagina. Vaginal discharge found.  Normal female external genitalia without visible lesions; mild amount of clear/white vaginal discharge; no visible vaginal bleeding; cervical os closed; no adnexal or CMT  Musculoskeletal: Normal range of motion.  Neurological: She is alert and oriented to person, place, and time.  Skin: Skin is warm and dry. She is not diaphoretic.  Psychiatric: She has a normal mood and affect.  Nursing note and vitals reviewed.   ED Course  Procedures (including critical care time) Labs Review Labs Reviewed  WET PREP, GENITAL - Abnormal; Notable for the following:    WBC, Wet Prep HPF POC MODERATE (*)    All other components within normal limits  CBC WITH DIFFERENTIAL/PLATELET - Abnormal; Notable for the following:    Hemoglobin 11.8 (*)    HCT 35.7 (*)    All other components within normal limits  COMPREHENSIVE METABOLIC PANEL - Abnormal; Notable for the following:    Calcium 8.5 (*)    ALT 12 (*)    Total Bilirubin 0.2 (*)    All other components within normal limits  URINALYSIS, ROUTINE W REFLEX MICROSCOPIC (NOT AT [facility]) - Abnormal; Notable for the following:    APPearance CLOUDY (*)    Leukocytes, UA TRACE (*)    All other components within normal limits  HCG, QUANTITATIVE, PREGNANCY - Abnormal; Notable for the following:    hCG, Beta Chain, Quant, S [id number] (*)    All other components within normal limits  URINE MICROSCOPIC-ADD ON - Abnormal; Notable for the following:    Squamous Epithelial / LPF FEW (*)    All other components within normal limits  RPR  HIV ANTIBODY (ROUTINE TESTING)  ABO/RH  GC/CHLAMYDIA PROBE AMP ([place]) NOT AT [facility]    Imaging Review [place] Ob Comp Less 14  Wks  11/22/2014   CLINICAL DATA:  Acute onset of pelvic cramping and vaginal spotting. Initial encounter.  EXAM: OBSTETRIC <14 WK US AND TRANSVAGINAL OB US  TECHNIQUE: Both transabdominal and transvaginal ultrasound examinations were performed for complete evaluation of the gestation as well as the maternal uterus, adnexal regions, and pelvic cul-de-sac. Transvaginal technique was performed to assess early pregnancy.  COMPARISON:  Pelvic ultrasound, and CT of the abdomen and pelvis performed 05/24/2014  FINDINGS: Intrauterine gestational sac: Visualized/normal in shape.  Yolk sac:  Yes  Embryo:  No  Cardiac Activity: N/A  MSD: 12.8 mm   6 w   1  d  Maternal uterus/adnexae: A small to moderate amount of subchorionic hemorrhage is noted. The uterus is otherwise unremarkable.  The ovaries are unremarkable in appearance. The right ovary measures 4.4 x 2.7 x 3.0 cm, while the left ovary measures 2.5 x 2.5 x 2.4 cm. No suspicious adnexal masses are seen; there is no evidence for ovarian  torsion.  No free fluid is seen within the pelvic cul-de-sac.  IMPRESSION: 1. Single intrauterine gestational sac noted, with a mean sac diameter of 1.3 cm, corresponding to a gestational age of [redacted] weeks 1 day. A yolk sac is seen. No embryo is yet visualized. This does not match the gestational age by LMP, though it remains too early to determine an estimated date of delivery. 2. Small to moderate amount of subchorionic hemorrhage noted.   Electronically Signed   By: Roanna Raider M.D.   On: 11/22/2014 00:00   US Ob Transvaginal  11/22/2014   CLINICAL DATA:  Acute onset of pelvic cramping and vaginal spotting. Initial encounter.  EXAM: OBSTETRIC <14 WK [place] AND TRANSVAGINAL OB US  TECHNIQUE: Both transabdominal and transvaginal ultrasound examinations were performed for complete evaluation of the gestation as well as the maternal uterus, adnexal regions, and pelvic cul-de-sac. Transvaginal technique was performed to assess early  pregnancy.  COMPARISON:  Pelvic ultrasound, and CT of the abdomen and pelvis performed 05/24/2014  FINDINGS: Intrauterine gestational sac: Visualized/normal in shape.  Yolk sac:  Yes  Embryo:  No  Cardiac Activity: N/A  MSD: 12.8 mm   6 w   1  d  Maternal uterus/adnexae: A small to moderate amount of subchorionic hemorrhage is noted. The uterus is otherwise unremarkable.  The ovaries are unremarkable in appearance. The right ovary measures 4.4 x 2.7 x 3.0 cm, while the left ovary measures 2.5 x 2.5 x 2.4 cm. No suspicious adnexal masses are seen; there is no evidence for ovarian torsion.  No free fluid is seen within the pelvic cul-de-sac.  IMPRESSION: 1. Single intrauterine gestational sac noted, with a mean sac diameter of 1.3 cm, corresponding to a gestational age of [redacted] weeks 1 day. A yolk sac is seen. No embryo is yet visualized. This does not match the gestational age by LMP, though it remains too early to determine an estimated date of delivery. 2. Small to moderate amount of subchorionic hemorrhage noted.   Electronically Signed   By: Roanna Raider M.D.   On: 11/22/2014 00:00     EKG Interpretation None      MDM   Final diagnoses:  Vaginal bleeding  Abdominal pain  Subchorionic hemorrhage, first trimester   39 year old G5 P3 a proximally 6 weeks chest station, presenting to the ED for abdominal cramping and vaginal bleeding. Patient is afebrile, nontoxic.  She endorses some cramping in her lower abdomen, no focal tenderness or peritoneal signs. Her vital signs are stable on room air. Labwork as above, H&H stable. Patient is A+, not a candidate for rhogam.  U/a non-infectious.  Pelvic exam without any active vaginal bleeding at this time, cervical os remains closed.  Wet prep negative.  Gc/chl, HIV, RPR pending.  Ultrasound was obtained, single intrauterine gestational sac without visible embryo.  There is subchorionic hemorrhage noted which is the likely source of patient's vaginal bleeding.   She was given Tylenol with some improvement of her cramping.  Patient started she may continue Tylenol as needed at home for pain. She is to follow-up with her OB/GYN, she has previously scheduled appointment next week. She is instructed to go to [facility] for any further complications regarding pregnancy. Patient acknowledged understanding and agreed with plan of care.  Garlon Hatchet, PA-C 11/22/14 0031  Alvira Monday, MD 11/22/14 [phone]

## 2014-11-21 NOTE — ED Notes (Signed)
Ultrasound at bedside

## 2014-11-21 NOTE — ED Notes (Signed)
Pt thinks she's about 5-6 pregnant, today when she went to the bathroom and wiped she saw blood on the toilet paper and she complains of abdominal pain

## 2014-11-22 LAB — GC/CHLAMYDIA PROBE AMP (~~LOC~~) NOT AT ARMC
CHLAMYDIA, DNA PROBE: NEGATIVE
NEISSERIA GONORRHEA: NEGATIVE

## 2014-11-22 LAB — HIV ANTIBODY (ROUTINE TESTING W REFLEX): HIV Screen 4th Generation wRfx: NONREACTIVE

## 2014-11-22 LAB — RPR: RPR: NONREACTIVE

## 2014-11-22 NOTE — Discharge Instructions (Signed)
Continue taking your prenatal vitamins as normal. You may take Tylenol as needed for abdominal pain or cramping. Follow-up with OB/GYN. [facility] go to [facility] for any further complications regarding pregnancy.

## 2014-11-30 LAB — OB RESULTS CONSOLE RUBELLA ANTIBODY, IGM: Rubella: IMMUNE

## 2014-11-30 LAB — OB RESULTS CONSOLE GC/CHLAMYDIA: Gonorrhea: NEGATIVE

## 2014-11-30 LAB — OB RESULTS CONSOLE HEPATITIS B SURFACE ANTIGEN: Hepatitis B Surface Ag: NEGATIVE

## 2014-12-31 ENCOUNTER — Inpatient Hospital Stay (HOSPITAL_COMMUNITY): Payer: Self-pay

## 2014-12-31 ENCOUNTER — Encounter (HOSPITAL_COMMUNITY): Payer: Self-pay | Admitting: *Deleted

## 2014-12-31 ENCOUNTER — Inpatient Hospital Stay (HOSPITAL_COMMUNITY)
Admission: AD | Admit: 2014-12-31 | Discharge: 2014-12-31 | Disposition: A | Discharge status: Home / Self Care | Payer: Self-pay | Source: Ambulatory Visit | Attending: Obstetrics and Gynecology | Admitting: Obstetrics and Gynecology

## 2014-12-31 DIAGNOSIS — O26899 Other specified pregnancy related conditions, unspecified trimester: Secondary | ICD-10-CM

## 2014-12-31 DIAGNOSIS — O9989 Other specified diseases and conditions complicating pregnancy, childbirth and the puerperium: Secondary | ICD-10-CM

## 2014-12-31 DIAGNOSIS — Z3A12 12 weeks gestation of pregnancy: Secondary | ICD-10-CM | POA: Insufficient documentation

## 2014-12-31 DIAGNOSIS — O209 Hemorrhage in early pregnancy, unspecified: Secondary | ICD-10-CM | POA: Insufficient documentation

## 2014-12-31 DIAGNOSIS — R109 Unspecified abdominal pain: Secondary | ICD-10-CM

## 2014-12-31 LAB — URINALYSIS, ROUTINE W REFLEX MICROSCOPIC
Bilirubin Urine: NEGATIVE
Glucose, UA: NEGATIVE mg/dL
Ketones, ur: NEGATIVE mg/dL
NITRITE: NEGATIVE
PROTEIN: NEGATIVE mg/dL
UROBILINOGEN UA: 0.2 mg/dL (ref 0.0–1.0)
pH: 6 (ref 5.0–8.0)

## 2014-12-31 LAB — POCT PREGNANCY, URINE: PREG TEST UR: POSITIVE — AB

## 2014-12-31 LAB — URINE MICROSCOPIC-ADD ON

## 2014-12-31 MED ORDER — CYCLOBENZAPRINE HCL 10 MG PO TABS: 10.0000 mg | ORAL_TABLET | Freq: Two times a day (BID) | ORAL | Status: DC | PRN

## 2014-12-31 MED ORDER — ACETAMINOPHEN 325 MG PO TABS
650.0000 mg | ORAL_TABLET | Freq: Once | ORAL | Status: AC
  Administered 2014-12-31: 650 mg via ORAL
  Filled 2014-12-31: qty 2

## 2014-12-31 NOTE — MAU Note (Signed)
Pt states she started bleeding about an hour ago, bright red, states it is heavy.  Also having lower abd pain that started 30 minutes ago.

## 2014-12-31 NOTE — Discharge Instructions (Signed)

## 2014-12-31 NOTE — MAU Provider Note (Signed)
History     CSN: [id number]  Arrival date and time: 12/31/14 1344   None     Chief Complaint  Patient presents with  . Vaginal Bleeding  . Abdominal Pain   HPI Pt is G5P3013 at 38weeks 2 d pregnant, pt of Dr. Emeline Darling, who presents with vaginal bleeding, lower abdominal pain and back ache.Pt has not taken anything for pain- she has back pain at times and takes Tylenol but did not take today, Pt has hx of [facility] when seen here in July.  Pt had sex one day ago and denies pain or bleeding afterwards. Pt denies constipation or diarrhea, nausea or vomiting RN note:  Registered Nurse Signed  MAU Note 12/31/2014 2:17 PM    Expand All Collapse All   Pt states she started bleeding about an hour ago, bright red, states it is heavy. Also having lower abd pain that started 30 minutes ago.         Past Medical History  Diagnosis Date  . Colitis   . Hypothyroid     Past Surgical History  Procedure Laterality Date  . No past surgeries      Family History  Problem Relation Age of Onset  . Heart disease Father   . Cancer Maternal Aunt   . Cancer Maternal Grandmother   . Heart disease Paternal Grandfather     Social History  Substance Use Topics  . Smoking status: Never Smoker   . Smokeless tobacco: Never Used  . Alcohol Use: No    Allergies: No Known Allergies  Prescriptions prior to admission  Medication Sig Dispense Refill Last Dose  . acetaminophen (TYLENOL) 500 MG tablet Take 1,000 mg by mouth every 6 (six) hours as needed for moderate pain.   11/21/2014 at Unknown time  . levothyroxine (SYNTHROID, LEVOTHROID) 100 MCG tablet Take 100 mcg by mouth daily before breakfast.    11/21/2014 at Unknown time  . loratadine (CLARITIN) 10 MG tablet Take 10 mg by mouth daily.   Past Month at Unknown time  . methocarbamol (ROBAXIN) 500 MG tablet Take 1 tablet (500 mg total) by mouth 2 (two) times daily. (Patient not taking: Reported on 11/21/2014) 20 tablet 0   . ondansetron (ZOFRAN  ODT) 4 MG disintegrating tablet  ODT q4 hours prn nausea/vomit (Patient not taking: Reported on 11/21/2014) 10 tablet 0   . oxyCODONE-acetaminophen (PERCOCET/ROXICET) 5-325 MG per tablet Take 1 tablet by mouth every 6 (six) hours as needed for severe pain. (Patient not taking: Reported on 11/21/2014) 15 tablet 0   . Prenatal Vit-Fe Fumarate-FA (PRENATAL VITAMIN PO) Take 1 tablet by mouth every evening.   11/20/2014 at Unknown time    Review of Systems  Constitutional: Negative for fever and chills.  Gastrointestinal: Positive for abdominal pain. Negative for nausea, vomiting, diarrhea and constipation.  Genitourinary: Negative for dysuria and urgency.  Musculoskeletal: Positive for back pain.   Physical Exam   Blood pressure 114/72, pulse 69, temperature 98 F (36.7 C), temperature source Oral, resp. rate 18, height  (1.575 m), weight 171 lb 12.8 oz (77.928 kg), last menstrual period 10/22/2014.  Physical Exam  Vitals reviewed. Constitutional: She is oriented to person, place, and time. She appears well-developed and well-nourished.  Uncomfortable appearing  HENT:  Head: Normocephalic.  Eyes: Pupils are equal, round, and reactive to light.  Neck: Normal range of motion. Neck supple.  Cardiovascular: Normal rate.   Respiratory: Effort normal.  GI: Soft. She exhibits no distension. There is tenderness. There  is no rebound and no guarding.  Mildly tender LLQ FHT audible with doppler  Genitourinary:  Small amount of Bright red blood in vault; one small clot; cervix close, long, NT  Musculoskeletal: Normal range of motion.  Neurological: She is alert and oriented to person, place, and time.  Skin: Skin is warm and dry.  Psychiatric: She has a normal mood and affect.    MAU Course  Procedures Results for orders placed or performed during the hospital encounter of 12/31/14 (from the past 24 hour(s))  Urinalysis, Routine w reflex microscopic (not at [facility])     Status: Abnormal    Collection Time: 12/31/14  2:22 PM  Result Value Ref Range   Color, Urine STRAW (A) YELLOW   APPearance CLEAR CLEAR   Specific Gravity, Urine <1.005 (L) 1.005 - 1.030   pH 6.0 5.0 - 8.0   Glucose, UA NEGATIVE NEGATIVE mg/dL   Hgb urine dipstick LARGE (A) NEGATIVE   Bilirubin Urine NEGATIVE NEGATIVE   Ketones, ur NEGATIVE NEGATIVE mg/dL   Protein, ur NEGATIVE NEGATIVE mg/dL   Urobilinogen, UA 0.2 0.0 - 1.0 mg/dL   Nitrite NEGATIVE NEGATIVE   Leukocytes, UA TRACE (A) NEGATIVE  Urine microscopic-add on     Status: Abnormal   Collection Time: 12/31/14  2:22 PM  Result Value Ref Range   Squamous Epithelial / LPF FEW (A) RARE   WBC, UA 0-2 <3 WBC/hpf   RBC / HPF 7-10 <3 RBC/hpf  Pregnancy, urine POC     Status: Abnormal   Collection Time: 12/31/14  2:28 PM  Result Value Ref Range   Preg Test, Ur POSITIVE (A) NEGATIVE  US Ob Transvaginal  12/31/2014   CLINICAL DATA:  Vaginal bleeding beginning this afternoon. Estimated gestational age [redacted] weeks 0 days per LMP and gestational age [redacted] weeks 6 days per previous ultrasound 11/21/2014.  EXAM: TRANSVAGINAL OB ULTRASOUND  TECHNIQUE: Transvaginal ultrasound was performed for complete evaluation of the gestation as well as the maternal uterus, adnexal regions, and pelvic cul-de-sac.  COMPARISON:  11/21/2014  FINDINGS: Intrauterine gestational sac: Visualized/normal in shape.  Yolk sac:  Not visualized.  Embryo:  Visualized.  Cardiac Activity: Visualized.  Heart Rate: 160  bpm  CRL:  5.3 cm   12 w   0 d                  [place] EDC: 07/15/2015  No evidence of subchorionic hemorrhage. Normal subjective amniotic fluid volume.  Maternal uterus/adnexae: Ovaries not visualized. No free pelvic fluid.  IMPRESSION: Single live IUP with estimated gestational age [redacted] weeks 0 days.   Electronically Signed   By: Elberta Fortis M.D.   On: 12/31/2014 16:00  ultrasound reported to dr. Ellyn Hack  Assessment and Plan  Vaginal bleeding in pregnancy abd and back pain in  pregnancy F/u with Dr. Denyce Robert 12/31/2014, 3:13 PM

## 2015-01-18 ENCOUNTER — Encounter (HOSPITAL_COMMUNITY): Payer: Self-pay | Admitting: Emergency Medicine

## 2015-01-18 ENCOUNTER — Encounter (HOSPITAL_COMMUNITY): Payer: Self-pay | Admitting: *Deleted

## 2015-01-18 ENCOUNTER — Inpatient Hospital Stay (HOSPITAL_COMMUNITY)
Admission: AD | Admit: 2015-01-18 | Discharge: 2015-01-18 | Disposition: A | Discharge status: Home / Self Care | Payer: Self-pay | Source: Ambulatory Visit | Attending: Obstetrics and Gynecology | Admitting: Obstetrics and Gynecology

## 2015-01-18 ENCOUNTER — Emergency Department (HOSPITAL_COMMUNITY)
Admission: EM | Admit: 2015-01-18 | Discharge: 2015-01-18 | Discharge status: Against Medical Advice | Payer: Self-pay | Attending: Emergency Medicine | Admitting: Emergency Medicine

## 2015-01-18 DIAGNOSIS — N949 Unspecified condition associated with female genital organs and menstrual cycle: Secondary | ICD-10-CM

## 2015-01-18 DIAGNOSIS — Z3A12 12 weeks gestation of pregnancy: Secondary | ICD-10-CM | POA: Insufficient documentation

## 2015-01-18 DIAGNOSIS — R102 Pelvic and perineal pain: Secondary | ICD-10-CM | POA: Insufficient documentation

## 2015-01-18 DIAGNOSIS — O9989 Other specified diseases and conditions complicating pregnancy, childbirth and the puerperium: Secondary | ICD-10-CM | POA: Insufficient documentation

## 2015-01-18 DIAGNOSIS — Z3A14 14 weeks gestation of pregnancy: Secondary | ICD-10-CM | POA: Insufficient documentation

## 2015-01-18 DIAGNOSIS — O2342 Unspecified infection of urinary tract in pregnancy, second trimester: Secondary | ICD-10-CM | POA: Insufficient documentation

## 2015-01-18 DIAGNOSIS — O26892 Other specified pregnancy related conditions, second trimester: Secondary | ICD-10-CM

## 2015-01-18 DIAGNOSIS — M549 Dorsalgia, unspecified: Secondary | ICD-10-CM | POA: Insufficient documentation

## 2015-01-18 DIAGNOSIS — N390 Urinary tract infection, site not specified: Secondary | ICD-10-CM

## 2015-01-18 DIAGNOSIS — R350 Frequency of micturition: Secondary | ICD-10-CM | POA: Insufficient documentation

## 2015-01-18 DIAGNOSIS — R109 Unspecified abdominal pain: Secondary | ICD-10-CM | POA: Insufficient documentation

## 2015-01-18 LAB — URINALYSIS, ROUTINE W REFLEX MICROSCOPIC
BILIRUBIN URINE: NEGATIVE
GLUCOSE, UA: NEGATIVE mg/dL
HGB URINE DIPSTICK: NEGATIVE
KETONES UR: NEGATIVE mg/dL
NITRITE: NEGATIVE
PH: 6.5 (ref 5.0–8.0)
Protein, ur: NEGATIVE mg/dL
SPECIFIC GRAVITY, URINE: 1.018 (ref 1.005–1.030)
Urobilinogen, UA: 0.2 mg/dL (ref 0.0–1.0)

## 2015-01-18 LAB — WET PREP, GENITAL
CLUE CELLS WET PREP: NONE SEEN
Trich, Wet Prep: NONE SEEN
Yeast Wet Prep HPF POC: NONE SEEN

## 2015-01-18 LAB — URINE MICROSCOPIC-ADD ON

## 2015-01-18 MED ORDER — IBUPROFEN 800 MG PO TABS
800.0000 mg | ORAL_TABLET | Freq: Once | ORAL | Status: AC
  Administered 2015-01-18: 800 mg via ORAL
  Filled 2015-01-18: qty 1

## 2015-01-18 MED ORDER — SULFAMETHOXAZOLE-TRIMETHOPRIM 800-160 MG PO TABS: 1.0000 | ORAL_TABLET | Freq: Two times a day (BID) | ORAL | Status: AC

## 2015-01-18 NOTE — MAU Note (Signed)
Pt reports lower abd and lower back pain since this am. Pain has worsened over the day, has taken tylenol every 4 hours without relief. Denies dysuria, denies bleeding.

## 2015-01-18 NOTE — ED Notes (Signed)
Pt states she is 3 months pregnant and today has been having lower abd pain with low back pain  Pt states she has been having a watery discharge and frequent urination  Pt denies spotting or bleeding  Pt states she took tylenol without relief

## 2015-01-18 NOTE — ED Notes (Signed)
Pt to registration window and states her pain is getting worse

## 2015-01-18 NOTE — MAU Provider Note (Signed)
History     CSN: [id number]  Arrival date and time: 01/18/15 2158   First Provider Initiated Contact with Patient 01/18/15 2317      No chief complaint on file.  Pelvic Pain The patient's primary symptoms include pelvic pain and vaginal discharge. The patient's pertinent negatives include no genital itching, genital odor or vaginal bleeding. This is a new problem. The current episode started today. The problem occurs constantly. The problem has been unchanged. The pain is severe (8/10). The problem affects both sides. She is pregnant. Associated symptoms include abdominal pain. Pertinent negatives include no constipation, diarrhea, dysuria, fever, frequency, nausea, urgency or vomiting. Nothing aggravates the symptoms. She has tried acetaminophen for the symptoms. The treatment provided mild relief.    Past Medical History  Diagnosis Date  . Colitis   . Hypothyroid     Past Surgical History  Procedure Laterality Date  . No past surgeries      Family History  Problem Relation Age of Onset  . Heart disease Father   . Cancer Maternal Aunt   . Cancer Maternal Grandmother   . Heart disease Paternal Grandfather     Social History  Substance Use Topics  . Smoking status: Never Smoker   . Smokeless tobacco: Never Used  . Alcohol Use: No    Allergies: No Known Allergies  Prescriptions prior to admission  Medication Sig Dispense Refill Last Dose  . acetaminophen (TYLENOL) 500 MG tablet Take 1,000 mg by mouth every 6 (six) hours as needed for moderate pain.   01/18/2015 at 2030  . levothyroxine (SYNTHROID, LEVOTHROID) 100 MCG tablet Take 100 mcg by mouth daily before breakfast.    Past Week at Unknown time  . Prenatal Vit-Fe Fumarate-FA (PRENATAL VITAMIN PO) Take 1 tablet by mouth every evening.   01/17/2015 at Unknown time  . cyclobenzaprine (FLEXERIL) 10 MG tablet Take 1 tablet (10 mg total) by mouth 2 (two) times daily as needed for muscle spasms. (Patient not taking: Reported  on 01/18/2015) 20 tablet 0 Not Taking at Unknown time    Review of Systems  Constitutional: Negative for fever.  Gastrointestinal: Positive for abdominal pain. Negative for nausea, vomiting, diarrhea and constipation.  Genitourinary: Positive for vaginal discharge and pelvic pain. Negative for dysuria, urgency and frequency.   Physical Exam   Blood pressure 115/73, pulse 66, temperature 98.2 F (36.8 C), temperature source Oral, resp. rate 18, height  (1.575 m), weight 78.019 kg (172 lb), last menstrual period 10/22/2014, SpO2 100 %.  Physical Exam  Nursing note and vitals reviewed. Constitutional: She is oriented to person, place, and time. She appears well-developed and well-nourished. No distress.  HENT:  Head: Normocephalic.  Cardiovascular: Normal rate.   Respiratory: Effort normal.  GI: Soft. There is no tenderness. There is no rebound.  Genitourinary:   External: no lesion Vagina: small amount of white discharge Cervix: pink, smooth, no CMT, closed/thick  Uterus: AGA, FHT 159 with doppler  Adnexa: NT   Neurological: She is alert and oriented to person, place, and time.  Skin: Skin is warm and dry.  Psychiatric: She has a normal mood and affect.    MAU Course  Procedures  MDM 2334: D/W Dr. Senaida Ores will treat for UTI while UC is pending. Ok for one time dose of ibuprofen prior to DC  Assessment and Plan   1. Pelvic pain affecting pregnancy in second trimester, antepartum   2. Round ligament pain   3. UTI (lower urinary tract infection)  DC home Comfort measures reviewed  2nd Trimester precautions  Bleeding precautions RX: bactrim DS  Return to MAU as needed FU with OB as planned  Follow-up Information    Call Oliver Pila, MD.   Specialty:  Obstetrics and Gynecology   Why:  If symptoms worsen   Contact information:   [address] [place] [address] [phone]         Carol Welch 01/18/2015, 11:18 PM

## 2015-01-18 NOTE — Discharge Instructions (Signed)
Urinary Tract Infection °Urinary tract infections (UTIs) can develop anywhere along your urinary tract. Your urinary tract is your body's drainage system for removing wastes and extra water. Your urinary tract includes two kidneys, two ureters, a bladder, and a urethra. Your kidneys are a pair of bean-shaped organs. Each kidney is about the size of your fist. They are located below your ribs, one on each side of your spine. °CAUSES °Infections are caused by microbes, which are microscopic organisms, including fungi, viruses, and bacteria. These organisms are so small that they can only be seen through a microscope. Bacteria are the microbes that most commonly cause UTIs. °SYMPTOMS  °Symptoms of UTIs may vary by age and gender of the patient and by the location of the infection. Symptoms in young women typically include a frequent and intense urge to urinate and a painful, burning feeling in the bladder or urethra during urination. Older women and men are more likely to be tired, shaky, and weak and have muscle aches and abdominal pain. A fever may mean the infection is in your kidneys. Other symptoms of a kidney infection include pain in your back or sides below the ribs, nausea, and vomiting. °DIAGNOSIS °To diagnose a UTI, your caregiver will ask you about your symptoms. Your caregiver also will ask to provide a urine sample. The urine sample will be tested for bacteria and white blood cells. White blood cells are made by your body to help fight infection. °TREATMENT  °Typically, UTIs can be treated with medication. Because most UTIs are caused by a bacterial infection, they usually can be treated with the use of antibiotics. The choice of antibiotic and length of treatment depend on your symptoms and the type of bacteria causing your infection. °HOME CARE INSTRUCTIONS °· If you were prescribed antibiotics, take them exactly as your caregiver instructs you. Finish the medication even if you feel better after you  have only taken some of the medication. °· Drink enough water and fluids to keep your urine clear or pale yellow. °· Avoid caffeine, tea, and carbonated beverages. They tend to irritate your bladder. °· Empty your bladder often. Avoid holding urine for long periods of time. °· Empty your bladder before and after sexual intercourse. °· After a bowel movement, women should cleanse from front to back. Use each tissue only once. °SEEK MEDICAL CARE IF:  °· You have back pain. °· You develop a fever. °· Your symptoms do not begin to resolve within 3 days. °SEEK IMMEDIATE MEDICAL CARE IF:  °· You have severe back pain or lower abdominal pain. °· You develop chills. °· You have nausea or vomiting. °· You have continued burning or discomfort with urination. °MAKE SURE YOU:  °· Understand these instructions. °· Will watch your condition. °· Will get help right away if you are not doing well or get worse. °Document Released: 01/30/2005 Document Revised: 10/22/2011 Document Reviewed: 05/31/2011 °[employer]® Patient Information ©2015 [employer]. This information is not intended to replace advice given to you by your health care provider. Make sure you discuss any questions you have with your health care provider. ° °Round Ligament Pain During Pregnancy °Round ligament pain is a sharp pain or jabbing feeling often felt in the lower belly or groin area on one or both sides. It is one of the most common complaints during pregnancy and is considered a normal part of pregnancy. It is most often felt during the second trimester. ° °Here is what you need to know about round   ligament pain, including some tips to help you feel better. ° °Causes of Round Ligament Pain ° °Several thick ligaments surround and support your womb (uterus) as it grows during pregnancy. One of them is called the round ligament. ° °The round ligament connects the front part of the womb to your groin, the area where your legs attach to your pelvis. The round  ligament normally tightens and relaxes slowly. ° °As your baby and womb grow, the round ligament stretches. That makes it more likely to become strained. ° °Sudden movements can cause the ligament to tighten quickly, like a rubber band snapping. This causes a sudden and quick jabbing feeling. ° °Symptoms of Round Ligament Pain ° °Round ligament pain can be concerning and uncomfortable. But it is considered normal as your body changes during pregnancy. ° °The symptoms of round ligament pain include a sharp, sudden spasm in the belly. It usually affects the right side, but it may happen on both sides. The pain only lasts a few seconds. ° °Exercise may cause the pain, as will rapid movements such as: ° °sneezing °coughing °laughing °rolling over in bed °standing up too quickly ° °Treatment of Round Ligament Pain ° °Here are some tips that may help reduce your discomfort: ° °Pain relief. Take over-the-counter acetaminophen for pain, if necessary. Ask your doctor if this is OK. ° °Exercise. Get plenty of exercise to keep your stomach (core) muscles strong. Doing stretching exercises or prenatal yoga can be helpful. Ask your doctor which exercises are safe for you and your baby. ° °A helpful exercise involves putting your hands and knees on the floor, lowering your head, and pushing your backside into the air. ° °Avoid sudden movements. Change positions slowly (such as standing up or sitting down) to avoid sudden movements that may cause stretching and pain. ° °Flex your hips. Bend and flex your hips before you cough, sneeze, or laugh to avoid pulling on the ligaments. ° °Apply warmth. A heating pad or warm bath may be helpful. Ask your doctor if this is OK. Extreme heat can be dangerous to the baby. ° °You should try to modify your daily activity level and avoid positions that may worsen the condition. ° °When to Call the Doctor/Midwife ° °Always tell your doctor or midwife about any type of pain you have during  pregnancy. Round ligament pain is quick and doesn't last long. ° °Call your health care provider immediately if you have: ° °severe pain °fever °chills °pain on urination °difficulty walking ° °Belly pain during pregnancy can be due to many different causes. It is important for your doctor to rule out more serious conditions, including pregnancy complications such as placenta abruption or non-pregnancy illnesses such as: ° °inguinal hernia °appendicitis °stomach, liver, and kidney problems °Preterm labor pains may sometimes be mistaken for round ligament pain.  °

## 2015-01-20 LAB — URINE CULTURE

## 2015-02-27 ENCOUNTER — Inpatient Hospital Stay (HOSPITAL_COMMUNITY)
Admission: AD | Admit: 2015-02-27 | Discharge: 2015-02-27 | Disposition: A | Discharge status: Home / Self Care | Payer: Medicaid Other | Source: Ambulatory Visit | Attending: Obstetrics and Gynecology | Admitting: Obstetrics and Gynecology

## 2015-02-27 ENCOUNTER — Encounter (HOSPITAL_COMMUNITY): Payer: Self-pay | Admitting: *Deleted

## 2015-02-27 DIAGNOSIS — E039 Hypothyroidism, unspecified: Secondary | ICD-10-CM | POA: Insufficient documentation

## 2015-02-27 DIAGNOSIS — Z79899 Other long term (current) drug therapy: Secondary | ICD-10-CM | POA: Diagnosis not present

## 2015-02-27 DIAGNOSIS — Z3A20 20 weeks gestation of pregnancy: Secondary | ICD-10-CM | POA: Insufficient documentation

## 2015-02-27 DIAGNOSIS — N898 Other specified noninflammatory disorders of vagina: Secondary | ICD-10-CM | POA: Insufficient documentation

## 2015-02-27 DIAGNOSIS — N949 Unspecified condition associated with female genital organs and menstrual cycle: Secondary | ICD-10-CM

## 2015-02-27 DIAGNOSIS — R102 Pelvic and perineal pain: Secondary | ICD-10-CM | POA: Diagnosis present

## 2015-02-27 DIAGNOSIS — O26892 Other specified pregnancy related conditions, second trimester: Secondary | ICD-10-CM | POA: Diagnosis not present

## 2015-02-27 DIAGNOSIS — M549 Dorsalgia, unspecified: Secondary | ICD-10-CM | POA: Diagnosis not present

## 2015-02-27 DIAGNOSIS — O9989 Other specified diseases and conditions complicating pregnancy, childbirth and the puerperium: Secondary | ICD-10-CM

## 2015-02-27 DIAGNOSIS — R109 Unspecified abdominal pain: Secondary | ICD-10-CM

## 2015-02-27 DIAGNOSIS — O26899 Other specified pregnancy related conditions, unspecified trimester: Secondary | ICD-10-CM

## 2015-02-27 DIAGNOSIS — O99282 Endocrine, nutritional and metabolic diseases complicating pregnancy, second trimester: Secondary | ICD-10-CM | POA: Insufficient documentation

## 2015-02-27 LAB — URINALYSIS, ROUTINE W REFLEX MICROSCOPIC
BILIRUBIN URINE: NEGATIVE
Glucose, UA: NEGATIVE mg/dL
Hgb urine dipstick: NEGATIVE
Ketones, ur: NEGATIVE mg/dL
NITRITE: NEGATIVE
PH: 6.5 (ref 5.0–8.0)
Protein, ur: NEGATIVE mg/dL
SPECIFIC GRAVITY, URINE: 1.02 (ref 1.005–1.030)
UROBILINOGEN UA: 0.2 mg/dL (ref 0.0–1.0)

## 2015-02-27 LAB — URINE MICROSCOPIC-ADD ON

## 2015-02-27 LAB — WET PREP, GENITAL
Clue Cells Wet Prep HPF POC: NONE SEEN
TRICH WET PREP: NONE SEEN
YEAST WET PREP: NONE SEEN

## 2015-02-27 LAB — AMNISURE RUPTURE OF MEMBRANE (ROM) NOT AT ARMC: AMNISURE: NEGATIVE

## 2015-02-27 NOTE — MAU Note (Signed)
Patient presents at [redacted] weeks gestation with c/o of lower abdominal and back pain and vaginal discharge since yesterday. Fetus active. States she did notice spotting yesterday but not today.

## 2015-02-27 NOTE — Discharge Instructions (Signed)

## 2015-02-27 NOTE — MAU Provider Note (Signed)
History     CSN: [id number]  Arrival date and time: 02/27/15 1151   First Provider Initiated Contact with Patient 02/27/15 1303      Chief Complaint  Patient presents with  . Abdominal Pain  . Back Pain  . Vaginal Discharge   HPI   Carol Welch is a 39 y.o. female [phone] at [redacted]w[redacted]d presenting to MAU with concerns about increased vaginal discharge, ?ROM, and pelvic pain/pressure. The Pelvic pressure and abdominal pain started last night after the patient noticed a large amount of discharge that went through her underwear and pants. Shortly after that she started noticing a lot of pelvic pressure.   She came today because she is concerned that maybe her water is broke. She was instructed by her MD to come to MAU for evaluation.   She has continued to feel the pelvic pain and pressure this morning. She rates her pain 6/10. She has continued to experience a small amount of leaking; patient wearing a panty liner.   OB History    Gravida Para Term Preterm AB TAB SAB Ectopic Multiple Living   Past Medical History  Diagnosis Date  . Colitis   . Hypothyroid     Past Surgical History  Procedure Laterality Date  . No past surgeries      Family History  Problem Relation Age of Onset  . Heart disease Father   . Cancer Maternal Aunt   . Cancer Maternal Grandmother   . Heart disease Paternal Grandfather     Social History  Substance Use Topics  . Smoking status: Never Smoker   . Smokeless tobacco: Never Used  . Alcohol Use: No    Allergies: No Known Allergies  Prescriptions prior to admission  Medication Sig Dispense Refill Last Dose  . acetaminophen (TYLENOL) 500 MG tablet Take 1,000 mg by mouth every 6 (six) hours as needed for moderate pain.   01/18/2015 at 2030  . levothyroxine (SYNTHROID, LEVOTHROID) 100 MCG tablet Take 100 mcg by mouth daily before breakfast.    Past Week at Unknown time  . Prenatal Vit-Fe Fumarate-FA (PRENATAL VITAMIN PO)  Take 1 tablet by mouth every evening.   01/17/2015 at Unknown time   Results for orders placed or performed during the hospital encounter of 02/27/15 (from the past 24 hour(s))  Urinalysis, Routine w reflex microscopic (not at [facility])     Status: Abnormal   Collection Time: 02/27/15 12:40 PM  Result Value Ref Range   Color, Urine YELLOW YELLOW   APPearance CLEAR CLEAR   Specific Gravity, Urine 1.020 1.005 - 1.030   pH 6.5 5.0 - 8.0   Glucose, UA NEGATIVE NEGATIVE mg/dL   Hgb urine dipstick NEGATIVE NEGATIVE   Bilirubin Urine NEGATIVE NEGATIVE   Ketones, ur NEGATIVE NEGATIVE mg/dL   Protein, ur NEGATIVE NEGATIVE mg/dL   Urobilinogen, UA 0.2 0.0 - 1.0 mg/dL   Nitrite NEGATIVE NEGATIVE   Leukocytes, UA TRACE (A) NEGATIVE  Urine microscopic-add on     Status: Abnormal   Collection Time: 02/27/15 12:40 PM  Result Value Ref Range   Squamous Epithelial / LPF MANY (A) RARE   WBC, UA 3-6 <3 WBC/hpf   RBC / HPF 0-2 <3 RBC/hpf   Bacteria, UA MANY (A) RARE  Amnisure rupture of membrane (rom)not at [facility]     Status: None   Collection Time: 02/27/15  1:20 PM  Result Value Ref Range  Amnisure ROM NEGATIVE   Wet prep, genital     Status: Abnormal   Collection Time: 02/27/15  1:20 PM  Result Value Ref Range   Yeast Wet Prep HPF POC NONE SEEN NONE SEEN   Trich, Wet Prep NONE SEEN NONE SEEN   Clue Cells Wet Prep HPF POC NONE SEEN NONE SEEN   WBC, Wet Prep HPF POC FEW (A) NONE SEEN    Review of Systems  Gastrointestinal: Positive for abdominal pain.  Genitourinary: Negative for dysuria, urgency, frequency, hematuria and flank pain.  Musculoskeletal: Negative for back pain.   Physical Exam   Blood pressure 113/54, pulse 73, temperature 98.2 F (36.8 C), temperature source Oral, resp. rate 16, height 5\' 3"  (1.6 m), weight 179 lb (81.194 kg), last menstrual period 10/22/2014.  Physical Exam  Constitutional: She is oriented to person, place, and time. She appears well-developed and  well-nourished. No distress.  HENT:  Head: Normocephalic.  Eyes: Pupils are equal, round, and reactive to light.  Neck: Neck supple.  Respiratory: Effort normal.  GI: Soft. She exhibits no distension. There is no tenderness. There is no rebound and no guarding.  Genitourinary:  Speculum exam: Vagina - Moderate amount of mucus like discharge, no odor, no Pooling of fluid in the vagina.  Cervix - No contact bleeding, no leaking of fluid  Bimanual exam: Cervix closed, anterior  Uterus non tender, gravid  Adnexa non tender, no masses bilaterally Wet prep done Chaperone present for exam.  Musculoskeletal: Normal range of motion.  Neurological: She is alert and oriented to person, place, and time.  Skin: Skin is warm. She is not diaphoretic.  Psychiatric: Her behavior is normal. Her mood appears anxious.    MAU Course  Procedures  None  MDM Discussed patient with Dr. Jackelyn KnifeMeisinger + fetal heart tones Negative fern slide Negative amnisure.   Urine culture pending   Assessment and Plan   A:  1. Abdominal pain in pregnancy   2. Round ligament pain   3. Vaginal discharge in pregnancy in second trimester     P:  Discharge home in stable condition Ok to taken ibuprofen or tylenol for discomfort. No ibuprofen in the 3rd trimester.  Pregnancy support belt Urine culture pending Preterm labor precautions Return to MAU if symptoms worsen Follow up with OB as scheduled.    Duane LopeJennifer I Alantra Popoca, NP 02/27/2015 5:50 PM

## 2015-03-01 LAB — CULTURE, OB URINE
Culture: 1000
Special Requests: NORMAL

## 2015-03-18 ENCOUNTER — Encounter (HOSPITAL_COMMUNITY): Payer: Self-pay

## 2015-03-18 ENCOUNTER — Inpatient Hospital Stay (HOSPITAL_COMMUNITY)
Admission: AD | Admit: 2015-03-18 | Discharge: 2015-03-20 | DRG: 781 | Disposition: A | Discharge status: Home / Self Care | Payer: Medicaid Other | Source: Ambulatory Visit | Attending: Obstetrics and Gynecology | Admitting: Obstetrics and Gynecology

## 2015-03-18 DIAGNOSIS — O99282 Endocrine, nutritional and metabolic diseases complicating pregnancy, second trimester: Secondary | ICD-10-CM | POA: Diagnosis present

## 2015-03-18 DIAGNOSIS — E860 Dehydration: Secondary | ICD-10-CM | POA: Diagnosis present

## 2015-03-18 DIAGNOSIS — O3432 Maternal care for cervical incompetence, second trimester: Secondary | ICD-10-CM | POA: Diagnosis present

## 2015-03-18 DIAGNOSIS — O26872 Cervical shortening, second trimester: Secondary | ICD-10-CM | POA: Diagnosis present

## 2015-03-18 DIAGNOSIS — O09522 Supervision of elderly multigravida, second trimester: Secondary | ICD-10-CM | POA: Diagnosis not present

## 2015-03-18 DIAGNOSIS — Z3A22 22 weeks gestation of pregnancy: Secondary | ICD-10-CM | POA: Diagnosis not present

## 2015-03-18 DIAGNOSIS — O479 False labor, unspecified: Secondary | ICD-10-CM | POA: Diagnosis present

## 2015-03-18 DIAGNOSIS — O4700 False labor before 37 completed weeks of gestation, unspecified trimester: Secondary | ICD-10-CM | POA: Diagnosis present

## 2015-03-18 LAB — FETAL FIBRONECTIN: FETAL FIBRONECTIN: POSITIVE — AB

## 2015-03-18 LAB — URINE MICROSCOPIC-ADD ON

## 2015-03-18 LAB — URINALYSIS, ROUTINE W REFLEX MICROSCOPIC
Bilirubin Urine: NEGATIVE
GLUCOSE, UA: NEGATIVE mg/dL
HGB URINE DIPSTICK: NEGATIVE
Ketones, ur: 15 mg/dL — AB
Nitrite: NEGATIVE
PROTEIN: NEGATIVE mg/dL
Urobilinogen, UA: 0.2 mg/dL (ref 0.0–1.0)
pH: 6 (ref 5.0–8.0)

## 2015-03-18 LAB — WET PREP, GENITAL
Clue Cells Wet Prep HPF POC: NONE SEEN
Trich, Wet Prep: NONE SEEN
Yeast Wet Prep HPF POC: NONE SEEN

## 2015-03-18 MED ORDER — CALCIUM CARBONATE ANTACID 500 MG PO CHEW: 2.0000 | CHEWABLE_TABLET | ORAL | Status: DC | PRN

## 2015-03-18 MED ORDER — PROGESTERONE MICRONIZED 200 MG PO CAPS
200.0000 mg | ORAL_CAPSULE | Freq: Every day | ORAL | Status: DC
  Administered 2015-03-18 – 2015-03-19 (×2): 200 mg via VAGINAL
  Filled 2015-03-18 (×2): qty 1

## 2015-03-18 MED ORDER — HYDROXYZINE HCL 25 MG PO TABS
25.0000 mg | ORAL_TABLET | Freq: Three times a day (TID) | ORAL | Status: DC | PRN
  Administered 2015-03-19 (×2): 25 mg via ORAL
  Filled 2015-03-18 (×3): qty 1

## 2015-03-18 MED ORDER — ZOLPIDEM TARTRATE 5 MG PO TABS: 5.0000 mg | ORAL_TABLET | Freq: Every evening | ORAL | Status: DC | PRN

## 2015-03-18 MED ORDER — LACTATED RINGERS IV SOLN
INTRAVENOUS | Status: DC
  Administered 2015-03-19: 11:00:00 via INTRAVENOUS

## 2015-03-18 MED ORDER — DOCUSATE SODIUM 100 MG PO CAPS
100.0000 mg | ORAL_CAPSULE | Freq: Every day | ORAL | Status: DC
  Administered 2015-03-19: 100 mg via ORAL
  Filled 2015-03-18: qty 1

## 2015-03-18 MED ORDER — ACETAMINOPHEN-CODEINE #3 300-30 MG PO TABS
1.0000 | ORAL_TABLET | ORAL | Status: DC | PRN
  Administered 2015-03-19 (×2): 1 via ORAL
  Filled 2015-03-18 (×2): qty 1

## 2015-03-18 MED ORDER — PRENATAL MULTIVITAMIN CH
1.0000 | ORAL_TABLET | Freq: Every day | ORAL | Status: DC
  Administered 2015-03-19: 1 via ORAL
  Filled 2015-03-18: qty 1

## 2015-03-18 MED ORDER — LACTATED RINGERS IV BOLUS (SEPSIS): 1000.0000 mL | Freq: Once | INTRAVENOUS | Status: DC

## 2015-03-18 MED ORDER — SODIUM CHLORIDE 0.9 % IV BOLUS (SEPSIS)
1000.0000 mL | Freq: Once | INTRAVENOUS | Status: AC
  Administered 2015-03-18: 1000 mL via INTRAVENOUS

## 2015-03-18 MED ORDER — ACETAMINOPHEN 325 MG PO TABS: 650.0000 mg | ORAL_TABLET | ORAL | Status: DC | PRN

## 2015-03-18 NOTE — H&P (Signed)
Evolette Pendell is a 39 y.o. female presenting for painful contractions. Pt has had pelvic cramps since [redacted]wks ga. She occasionally had spotting associated with cramps as well. She was in office on 11/10 with similar complaints and exam done. She was found to have a closed cervix and cbc and cmp were neg. No signs of infection. 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 She denies any bleeding. No recent intercourse. Urine dipstick done in MAU noted ketonuria. Pt admits has not eaten all day due to no appetite. She denies any nausea or vomiting and no fever/chills. Just doesn't feel well. An FFN was also obtained in MAu and noted to be positive ( pt is however only [redacted] weeks ga ; >24weeks is more ideal for ffn test).  Maternal Medical History:  Reason for admission: Contractions.  Nausea.  Contractions: Onset was 6-12 hours ago.   Frequency: regular.   Perceived severity is strong.    Fetal activity: Perceived fetal activity is normal.   Last perceived fetal movement was within the past hour.    Prenatal complications: Pelvic pain/cramping; shortened cervix  Prenatal Complications - Diabetes: none.    OB History    Gravida Para Term Preterm AB TAB SAB Ectopic Multiple Living   Past Medical History  Diagnosis Date  . Colitis   . Hypothyroid    Past Surgical History  Procedure Laterality Date  . No past surgeries     Family History: family history includes Cancer in her maternal aunt and maternal grandmother; Heart disease in her father and paternal grandfather. Social History:  reports that she has never smoked. She has never used smokeless tobacco. She reports that she does not drink alcohol or use illicit drugs.   Prenatal Transfer Tool  Maternal Diabetes: No Genetic Screening: Declined Maternal Ultrasounds/Referrals: Abnormal:  Findings:   Other:shortened cervix, nl fetus Fetal  Ultrasounds or other Referrals:  None Maternal Substance Abuse:  No Significant Maternal Medications:  Meds include: Other: vaginal progesterone Significant Maternal Lab Results:  None Other Comments:  None  Review of Systems  Constitutional: Positive for malaise/fatigue. Negative for fever and chills.  Eyes: Negative for blurred vision.  Respiratory: Negative for shortness of breath.   Cardiovascular: Negative for chest pain and leg swelling.  Gastrointestinal: Positive for abdominal pain. Negative for heartburn, nausea, vomiting, diarrhea and constipation.  Genitourinary: Negative for dysuria, urgency and frequency.  Musculoskeletal: Positive for back pain.  Skin: Negative for rash.  Neurological: Positive for weakness. Negative for dizziness and headaches.  Psychiatric/Behavioral: Negative for depression. The patient is not nervous/anxious.     Dilation: Closed Exam by:: Dr Mindi Slicker Blood pressure 116/61, pulse 85, temperature 98.2 F (36.8 C), resp. rate 22, height  (1.575 m), weight 180 lb 6.4 oz (81.829 kg), last menstrual period 10/22/2014. Maternal Exam:  Uterine Assessment: Contraction strength is mild.  Contraction frequency is regular.   Abdomen: Patient reports generalized tenderness.  Introitus: Normal vulva. Normal vagina.  Pelvis: adequate for delivery.   Cervix: Cervix evaluated by digital exam.   External os is ft but cervix closed/thick/high  Physical Exam  Constitutional: She is oriented to person, place, and time. She appears well-developed and well-nourished.  HENT:  Head: Normocephalic.  Neck: Normal range of motion.  Cardiovascular: Normal rate.   Respiratory: Effort normal.  GI: Soft. There is generalized  tenderness.  Genitourinary: Vagina normal and uterus normal.  Musculoskeletal: Normal range of motion.  Neurological: She is alert and oriented to person, place, and time.  Skin: Skin is warm.  Psychiatric: She has a normal mood and affect. Her  behavior is normal. Judgment and thought content normal.    Prenatal labs: ABO, Rh: --/--/A POS (07/18 2138) Antibody:   Rubella:   RPR: Non Reactive (07/18 2138)  HBsAg:    HIV: Non Reactive (07/18 2138)  GBS:     Assessment/Plan: [id number] female with no prior history of preterm labor, at 5622 2/[redacted]wks gestation with preterm contractions, dehydration and general malaise.  FHT confirmed and reassuring On arrival I examined pt myself and cervix is still closed.   Recommend admission for hydration, observation and reassessment in am Will get cervical length repeated in am ( was 2.9cm from 2.7cm at last check) Will continue on vaginal progesterone Will put in for a Neonatology consult in am Will send urine for culture  [facility] Center LLCCecilia Worema Daksha Koone 03/18/2015, 10:45 PM

## 2015-03-18 NOTE — MAU Provider Note (Signed)
MAU HISTORY AND PHYSICAL  Chief Complaint:  Contractions   Carol Welch is a 39 y.o.  [id number] with IUP at 3859w5d presenting for Contractions  Has had 2 days of mild intermittent contractions. This evening more regular and more painful. No leakage of fluid or bleeding. No dysuria or hematuria. Diagnosed w/ short cervix last month, has been placing vaginal prometrium. Nothing else in the vagina last 24 hours.    Past Medical History  Diagnosis Date  . Colitis   . Hypothyroid     Past Surgical History  Procedure Laterality Date  . No past surgeries      Family History  Problem Relation Age of Onset  . Heart disease Father   . Cancer Maternal Aunt   . Cancer Maternal Grandmother   . Heart disease Paternal Grandfather     Social History  Substance Use Topics  . Smoking status: Never Smoker   . Smokeless tobacco: Never Used  . Alcohol Use: No    No Known Allergies  Prescriptions prior to admission  Medication Sig Dispense Refill Last Dose  . acetaminophen (TYLENOL) 500 MG tablet Take 1,000 mg by mouth every 6 (six) hours as needed for moderate pain.   03/18/2015 at Unknown time  . levothyroxine (SYNTHROID, LEVOTHROID) 100 MCG tablet Take 100 mcg by mouth daily before breakfast.    03/18/2015 at Unknown time  . Prenatal Vit-Fe Fumarate-FA (PRENATAL VITAMIN PO) Take 1 tablet by mouth every evening.   03/18/2015 at Unknown time  . progesterone 200 MG SUPP Place 200 mg vaginally at bedtime.   03/17/2015 at Unknown time    Review of Systems - Negative except for what is mentioned in HPI.  Physical Exam  Blood pressure 116/61, pulse 85, temperature 98.2 F (36.8 C), resp. rate 22, height 5\' 2"  (1.575 m), weight 180 lb 6.4 oz (81.829 kg), last menstrual period 10/22/2014. GENERAL: Well-developed, well-nourished female in no acute distress. Appears in mod pain w/ contractions LUNGS: Clear to auscultation bilaterally.  HEART: Regular rate and rhythm. ABDOMEN: Soft, nontender,  nondistended, gravid.  EXTREMITIES: Nontender, no edema, 2+ distal pulses. GU: clear white discharge small amount, normal multiparous cervix.  Cervix: closed/thick/high FHTs: 150s Toco: q 10 minutes   Labs: Results for orders placed or performed during the hospital encounter of 03/18/15 (from the past 24 hour(s))  Urinalysis, Routine w reflex microscopic (not [facility])   Collection Time: 03/18/15  8:10 PM  Result Value Ref Range   Color, Urine YELLOW YELLOW   APPearance HAZY (A) CLEAR   Specific Gravity, Urine >1.030 (H) 1.005 - 1.030   pH 6.0 5.0 - 8.0   Glucose, UA NEGATIVE NEGATIVE mg/dL   Hgb urine dipstick NEGATIVE NEGATIVE   Bilirubin Urine NEGATIVE NEGATIVE   Ketones, ur 15 (A) NEGATIVE mg/dL   Protein, ur NEGATIVE NEGATIVE mg/dL   Urobilinogen, UA 0.2 0.0 - 1.0 mg/dL   Nitrite NEGATIVE NEGATIVE   Leukocytes, UA SMALL (A) NEGATIVE  Urine microscopic-add on   Collection Time: 03/18/15  8:10 PM  Result Value Ref Range   Squamous Epithelial / LPF FEW (A) RARE   WBC, UA 7-10 <3 WBC/hpf   Bacteria, UA FEW (A) RARE   Urine-Other MUCOUS PRESENT   Wet prep, genital   Collection Time: 03/18/15  8:35 PM  Result Value Ref Range   Yeast Wet Prep HPF POC NONE SEEN NONE SEEN   Trich, Wet Prep NONE SEEN NONE SEEN   Clue Cells Wet Prep HPF POC NONE SEEN  NONE SEEN   WBC, Wet Prep HPF POC FEW (A) NONE SEEN  Fetal fibronectin   Collection Time: 03/18/15  8:35 PM  Result Value Ref Range   Fetal Fibronectin POSITIVE (A) NEGATIVE    Imaging Studies:  No results found.  Assessment/Plan: Carol Welch is  39 y.o. [phone] at [redacted]w[redacted]d presents with threatened preterm labor. Contracting every 10 minutes, cervix closed. Did not check cervical length as was diagnosed with CI previously and I do not have that length for comparison. I did obtain FFN, which is positive. Urinalysis suggestive of dehydration; wet prep negative. Have called and spoken with patient's on-call OB, Dr. Mindi Slicker, who will  present to evaluate the patient. Will start IV fluids.    Cherrie Gauze Timmie Dugue 11/12/201610:08 PM

## 2015-03-18 NOTE — MAU Note (Addendum)
Having contractions that come and go. Happening for couple days. Having a lot of pelvic pressure today. Have rested past few days but went in to work today and pressure a lot worse. Denies LOF or bleeding. Some white, clear d/c. Pt states was told has short cervix

## 2015-03-19 ENCOUNTER — Inpatient Hospital Stay (HOSPITAL_COMMUNITY): Payer: Medicaid Other

## 2015-03-19 DIAGNOSIS — O3432 Maternal care for cervical incompetence, second trimester: Secondary | ICD-10-CM | POA: Diagnosis present

## 2015-03-19 LAB — COMPREHENSIVE METABOLIC PANEL
ALBUMIN: 2.7 g/dL — AB (ref 3.5–5.0)
ALK PHOS: 39 U/L (ref 38–126)
ALT: 11 U/L — ABNORMAL LOW (ref 14–54)
ALT: 10 U/L — AB (ref 14–54)
AST: 18 U/L (ref 15–41)
AST: 17 U/L (ref 15–41)
Albumin: 2.4 g/dL — ABNORMAL LOW (ref 3.5–5.0)
Alkaline Phosphatase: 46 U/L (ref 38–126)
Anion gap: 7 (ref 5–15)
Anion gap: 6 (ref 5–15)
BUN: 10 mg/dL (ref 6–20)
BUN: 9 mg/dL (ref 6–20)
CALCIUM: 7.7 mg/dL — AB (ref 8.9–10.3)
CHLORIDE: 107 mmol/L (ref 101–111)
CO2: 22 mmol/L (ref 22–32)
CO2: 22 mmol/L (ref 22–32)
CREATININE: 0.62 mg/dL (ref 0.44–1.00)
Calcium: 8.1 mg/dL — ABNORMAL LOW (ref 8.9–10.3)
Chloride: 108 mmol/L (ref 101–111)
Creatinine, Ser: 0.63 mg/dL (ref 0.44–1.00)
GFR calc Af Amer: >60 mL/min (ref >60)
GFR calc non Af Amer: >60 mL/min (ref >60)
GLUCOSE: 108 mg/dL — AB (ref 65–99)
Glucose, Bld: 88 mg/dL (ref 65–99)
POTASSIUM: 3.3 mmol/L — AB (ref 3.5–5.1)
Potassium: 3.5 mmol/L (ref 3.5–5.1)
Sodium: 136 mmol/L (ref 135–145)
Sodium: 136 mmol/L (ref 135–145)
Total Bilirubin: 0.3 mg/dL (ref 0.3–1.2)
Total Bilirubin: 0.3 mg/dL (ref 0.3–1.2)
Total Protein: 6 g/dL — ABNORMAL LOW (ref 6.5–8.1)
Total Protein: 5.5 g/dL — ABNORMAL LOW (ref 6.5–8.1)

## 2015-03-19 LAB — CBC
HCT: 27.9 % — ABNORMAL LOW (ref 36.0–46.0)
Hemoglobin: 9.6 g/dL — ABNORMAL LOW (ref 12.0–15.0)
MCH: 29.4 pg (ref 26.0–34.0)
MCHC: 34.4 g/dL (ref 30.0–36.0)
MCV: 85.3 fL (ref 78.0–100.0)
PLATELETS: 195 10*3/uL (ref 150–400)
RBC: 3.27 MIL/uL — ABNORMAL LOW (ref 3.87–5.11)
RDW: 13.7 % (ref 11.5–15.5)
WBC: 7.7 10*3/uL (ref 4.0–10.5)

## 2015-03-19 LAB — TYPE AND SCREEN
ABO/RH(D): A POS
ANTIBODY SCREEN: NEGATIVE

## 2015-03-19 LAB — ABO/RH: ABO/RH(D): A POS

## 2015-03-19 MED ORDER — LEVOTHYROXINE SODIUM 100 MCG PO TABS
100.0000 ug | ORAL_TABLET | Freq: Every day | ORAL | Status: DC
  Administered 2015-03-19 – 2015-03-20 (×2): 100 ug via ORAL
  Filled 2015-03-19 (×3): qty 1

## 2015-03-19 MED ORDER — LEVOTHYROXINE SODIUM 100 MCG PO TABS: 100.0000 ug | ORAL_TABLET | Freq: Every day | ORAL | Status: DC

## 2015-03-19 NOTE — Consult Note (Signed)
[facility]  Prenatal Consult       03/19/2015  4:48 PM   I was asked by Dr. Mindi SlickerBanga to consult on this patient for possible preterm delivery.  I had the pleasure of meeting with Ms. Carol Welch today.  She is a 39 y/o G5P3013 at 7723 and 6/[redacted] weeks gestation.  She was admitted to the antepartum unit yesterday with painful contractions, which have improved with IV hydration.  She has also been diagnosed with a shortened cervix, and is on vaginal progesterone.  The fetus is a female with no anomalies.  If she remains stable, the plan is to send her home tomorrow.    I explained that we do not routinely offer resuscitation to infants less than [redacted] weeks gestation because the chance of survival is so low.  I also explained that we would universally recommend resuscitating a typical infant >[redacted] weeks gestation because the chance of survival with a good outcome is so high.   I explained that the neonatal intensive care team would be present for the delivery and outlined the likely delivery room course for this baby including routine resuscitation and NRP-guided approaches to the treatment of respiratory distress. We discussed other common problems associated with prematurity including respiratory distress syndrome/CLD, apnea, feeding issues, temperature regulation, and infection risk.  We discussed IVH/PVL, ROP, and NEC and that these are complications associated with prematurity, and that by 30 weeks are uncommon.    We discussed the average length of stay but I noted that the actual LOS would depend on the severity of problems encountered and response to treatments.  We discussed visitation policies and the resources available while her child is in the hospital.  We discussed the importance of good nutrition and various methods of providing nutrition (parenteral hyperalimentation, gavage feedings and/or oral feeding). We discussed the benefits of human milk. I encouraged breast  feeding and pumping soon after birth and outlined resources that are available to support breast feeding. She does intend to breastfeed her infant.   Finally, given all this information, we discussed possible approaches that parents choose in cases where survival is in question: 1) comfort care 2) initial care, if medically possible, to assess response to therapies, or 3) provision of aggressive, high technology care until such care is deemed futile.  At this time, should her infant be born extremely prematurely, Ms. Carol Welch want to provide aggressive, high technology care until such care is deemed futile.  I emphasized the importance of clear, honest, and open communication and indicated that we would involve her in decision making.   Thank you for involving Carol Welch in the care of this patient. A member of our team will be available should the family have additional questions.  Time for consultation approximately 45 minutes.   _____________________ Electronically Signed By: Maryan CharLindsey Imane Burrough, MD Neonatologist

## 2015-03-19 NOTE — Progress Notes (Signed)
Patient ID: Carol Welch, female   DOB: 12-16-1975, 39 y.o.   MRN: [id number] Pt seen and assessed. Has had some intermittent spells of contraction during the day today but overall says medication has been very helpful and has significantly improved pain/intensity of contractions when they do occur. No VB or LOF.  Reviewed preliminary u/s findings :  CL 3.1, Nl AFI ( largest pocket 7.6cm) SVE closed/thick/high, unchanged from yesterday  A/P: 22 3/7wks iup on vaginal progesterone due to shortened cervix.          Contractions improved with iv hydration, vistaril ant tylenol #3         Continue current management         NICU consult pending         If stable in am, likely discharge to home         Will resume levothyroxine         All questions answered

## 2015-03-19 NOTE — Progress Notes (Signed)
Patient ID: Carol Welch, female   DOB: 1976-04-09, 39 y.o.   MRN: [id number] Pt seen this am. Was able to sleep overnight after vistaril and tylenol #3. COntractions not as painful this am. En route to U/s to have cervical length scan done. No VB or LOF.  Will discuss results of scan once available Likely NICU consult today

## 2015-03-20 LAB — URINE CULTURE

## 2015-03-20 LAB — GC/CHLAMYDIA PROBE AMP (~~LOC~~) NOT AT ARMC
CHLAMYDIA, DNA PROBE: NEGATIVE
Neisseria Gonorrhea: NEGATIVE

## 2015-03-20 MED ORDER — ACETAMINOPHEN-CODEINE #3 300-30 MG PO TABS: 1.0000 | ORAL_TABLET | Freq: Four times a day (QID) | ORAL | Status: DC | PRN

## 2015-03-20 MED ORDER — HYDROXYZINE HCL 25 MG PO TABS: 25.0000 mg | ORAL_TABLET | Freq: Three times a day (TID) | ORAL | Status: DC | PRN

## 2015-03-20 NOTE — Discharge Instructions (Signed)
Nothing in vagina till next office appt - no douching, no intercourse

## 2015-03-20 NOTE — Plan of Care (Signed)
Problem: Education: Goal: Knowledge of disease or condition will improve Outcome: Progressing Discussed discharge plan and need for further evaluation.

## 2015-03-20 NOTE — Progress Notes (Signed)
Patient ID: Carol Welch, female   DOB: 08/22/1975, 39 y.o.   MRN: [id number] Pt sleeping but easily arousable. Feels well. She reports contractions seem to have resolved but still has some occasional pelvic pressure. No dysuria, LOF or VB. Appreciates FMs.  Pt was seen by Neonatologist yesterday; has no questions at this time regarding discussion. Discussed discharge to home today and pt comfortable with that Recommend off work till next office visit on 11/21 which is in a week, will be close to [redacted]wks gestation as well.  Rx will be given for vistaril and tylenol #3 Pelvic rest till next appt

## 2015-03-20 NOTE — Discharge Summary (Signed)
OB Discharge Summary     Patient Name: Carol Welch DOB: [birth year] MRN: [id number]  Date of admission: 03/18/2015 Delivering MD: This patient has no babies on file.  Date of discharge: 03/20/2015  Admitting diagnosis: 22 WKS, CTXS, PRESSURE Intrauterine pregnancy: [redacted]w[redacted]d     Secondary diagnosis:  Active Problems:   Preterm contractions   Incompetent cervix during second trimester, antepartum  Additional problems: none     Discharge diagnosis: IUp at 22+ weeks with preterm contractions - resolved. Incompetent cervix stable                                                                                                Post partum procedures:n/a  Augmentation: n/a  Complications: None  Hospital course: Pt admitted for management of preterm contractions which have now resovled with IV fluids, vistaril and tylenol ##. Cervical length stable - on vagianl progesterone. Reassuring fetal assessment  Physical exam  Filed Vitals:   03/19/15 1852 03/19/15 2005 03/19/15 2100 03/20/15 0742  BP: 90/50 95/58    Pulse: 65 65    Temp:  98 F (36.7 C)  98.1 F (36.7 C)  TempSrc:  Oral  Oral  Resp: Height:      Weight:       General: alert, cooperative and no distress Uterine Fundus: consistent with [redacted]week ga DVT Evaluation: No significant calf/ankle edema. Labs: Lab Results  Component Value Date   WBC 7.7 03/19/2015   HGB 9.6* 03/19/2015   HCT 27.9* 03/19/2015   MCV 85.3 03/19/2015   PLT 195 03/19/2015   CMP Latest Ref Rng 03/19/2015  Glucose 65 - 99 mg/dL 88  BUN 6 - 20 mg/dL 9  Creatinine 4.09 - 8.11 mg/dL 9.14  Sodium 782 - 956 mmol/L 136  Potassium 3.5 - 5.1 mmol/L 3.5  Chloride 101 - 111 mmol/L 108  CO2 22 - 32 mmol/L 22  Calcium 8.9 - 10.3 mg/dL 7.7(L)  Total Protein 6.5 - 8.1 g/dL 2.1(H)  Total Bilirubin 0.3 - 1.2 mg/dL 0.3  Alkaline Phos 38 - 126 U/L 39  AST 15 - 41 U/L 17  ALT 14 - 54 U/L 10(L)    Discharge instruction: per After Visit  Summary and "Baby and Me Booklet".  After visit meds:    Medication List    STOP taking these medications        acetaminophen 500 MG tablet  Commonly known as:  TYLENOL      TAKE these medications        levothyroxine 100 MCG tablet  Commonly known as:  SYNTHROID, LEVOTHROID  Take 100 mcg by mouth daily before breakfast.     PRENATAL VITAMIN PO  Take 1 tablet by mouth every evening.     progesterone 200 MG Supp  Place 200 mg vaginally at bedtime.        Diet: routine diet  Activity: Advance as tolerated. Pelvic rest for 6 weeks.   Outpatient follow up:1week Follow up Appt:No future appointments. Follow up Visit:No Follow-up on file.  Postpartum contraception: n/a  Newborn Data: This patient has  no babies on file. Disposition:not delivered yet   03/20/2015 Edwinna Areolaecilia Worema Banga, DO

## 2015-03-25 ENCOUNTER — Inpatient Hospital Stay (HOSPITAL_COMMUNITY)
Admission: AD | Admit: 2015-03-25 | Discharge: 2015-03-25 | Disposition: A | Discharge status: Home / Self Care | Payer: Medicaid Other | Source: Ambulatory Visit | Attending: Obstetrics and Gynecology | Admitting: Obstetrics and Gynecology

## 2015-03-25 ENCOUNTER — Encounter (HOSPITAL_COMMUNITY): Payer: Self-pay | Admitting: *Deleted

## 2015-03-25 DIAGNOSIS — O9989 Other specified diseases and conditions complicating pregnancy, childbirth and the puerperium: Secondary | ICD-10-CM | POA: Diagnosis not present

## 2015-03-25 DIAGNOSIS — O99282 Endocrine, nutritional and metabolic diseases complicating pregnancy, second trimester: Secondary | ICD-10-CM | POA: Insufficient documentation

## 2015-03-25 DIAGNOSIS — Z3A23 23 weeks gestation of pregnancy: Secondary | ICD-10-CM | POA: Insufficient documentation

## 2015-03-25 DIAGNOSIS — O26892 Other specified pregnancy related conditions, second trimester: Secondary | ICD-10-CM | POA: Diagnosis not present

## 2015-03-25 DIAGNOSIS — E039 Hypothyroidism, unspecified: Secondary | ICD-10-CM | POA: Diagnosis not present

## 2015-03-25 DIAGNOSIS — O09212 Supervision of pregnancy with history of pre-term labor, second trimester: Secondary | ICD-10-CM

## 2015-03-25 DIAGNOSIS — R102 Pelvic and perineal pain: Secondary | ICD-10-CM

## 2015-03-25 DIAGNOSIS — N949 Unspecified condition associated with female genital organs and menstrual cycle: Secondary | ICD-10-CM

## 2015-03-25 DIAGNOSIS — O09892 Supervision of other high risk pregnancies, second trimester: Secondary | ICD-10-CM

## 2015-03-25 LAB — URINE MICROSCOPIC-ADD ON

## 2015-03-25 LAB — URINALYSIS, ROUTINE W REFLEX MICROSCOPIC
BILIRUBIN URINE: NEGATIVE
GLUCOSE, UA: NEGATIVE mg/dL
Hgb urine dipstick: NEGATIVE
Ketones, ur: NEGATIVE mg/dL
NITRITE: NEGATIVE
PH: 5.5 (ref 5.0–8.0)
Protein, ur: NEGATIVE mg/dL

## 2015-03-25 MED ORDER — BETAMETHASONE SOD PHOS & ACET 6 (3-3) MG/ML IJ SUSP
12.0000 mg | Freq: Once | INTRAMUSCULAR | Status: AC
  Administered 2015-03-25: 12 mg via INTRAMUSCULAR
  Filled 2015-03-25: qty 2

## 2015-03-25 MED ORDER — NIFEDIPINE 10 MG PO CAPS: 10.0000 mg | ORAL_CAPSULE | Freq: Three times a day (TID) | ORAL | Status: DC | PRN

## 2015-03-25 MED ORDER — NIFEDIPINE 10 MG PO CAPS
10.0000 mg | ORAL_CAPSULE | Freq: Once | ORAL | Status: AC
  Administered 2015-03-25: 10 mg via ORAL
  Filled 2015-03-25: qty 1

## 2015-03-25 NOTE — MAU Note (Signed)
Started cramping and having contractions last night, have gotten closer and stronger. Some spotting yesterday noted urination.

## 2015-03-25 NOTE — MAU Provider Note (Signed)
History     CSN: [id number]  Arrival date and time: 03/25/15 1126   None     Chief Complaint  Patient presents with  . Contractions   HPI   Carol Welch is a 39 y.o. female [phone] at 1435w5d presenting to MAU with contractions. She was recently seen and admitted from MAU on 11/12 for threatened preterm labor and preterm contractions. Recent US shows a cervical length of 3.1 cm. The patient was sent home with vistaril and tylenol #3 for pain in which the patient says helps only minimally.  She is feeling contraction pain every 7-10 minutes,  and rates her pain 8/10 when a contraction comes. The pain comes and goes.   Denies vaginal bleeding.  Denies leaking of fluid Tried having intercourse a few days ago, however was unable due to discomfort.  + fetal movement.   OB History    Gravida Para Term Preterm AB TAB SAB Ectopic Multiple Living   5 3 3  1  1   3       Past Medical History  Diagnosis Date  . Colitis   . Hypothyroid     Past Surgical History  Procedure Laterality Date  . No past surgeries      Family History  Problem Relation Age of Onset  . Heart disease Father   . Cancer Maternal Aunt   . Cancer Maternal Grandmother   . Heart disease Paternal Grandfather     Social History  Substance Use Topics  . Smoking status: Never Smoker   . Smokeless tobacco: Never Used  . Alcohol Use: No    Allergies: No Known Allergies  Prescriptions prior to admission  Medication Sig Dispense Refill Last Dose  . acetaminophen-codeine (TYLENOL #3) 300-30 MG tablet Take 1 tablet by mouth every 6 (six) hours as needed for moderate pain. 30 tablet 0 03/25/2015 at Unknown time  . hydrOXYzine (ATARAX/VISTARIL) 25 MG tablet Take 1 tablet (25 mg total) by mouth 3 (three) times daily as needed (contraction pain). 60 tablet 1 03/25/2015 at Unknown time  . levothyroxine (SYNTHROID, LEVOTHROID) 100 MCG tablet Take 100 mcg by mouth daily before breakfast.    03/25/2015 at Unknown  time  . Prenatal Vit-Fe Fumarate-FA (PRENATAL VITAMIN PO) Take 1 tablet by mouth every evening.   03/24/2015 at Unknown time  . sulfamethoxazole-trimethoprim (BACTRIM DS,SEPTRA DS) 800-160 MG tablet Take 1 tablet by mouth 2 (two) times daily.   03/20/2015   Results for orders placed or performed during the hospital encounter of 03/25/15 (from the past 48 hour(s))  Urinalysis, Routine w reflex microscopic (not at [facility])     Status: Abnormal   Collection Time: 03/25/15 11:32 AM  Result Value Ref Range   Color, Urine YELLOW YELLOW   APPearance CLEAR CLEAR   Specific Gravity, Urine <1.005 (L) 1.005 - 1.030   pH 5.5 5.0 - 8.0   Glucose, UA NEGATIVE NEGATIVE mg/dL   Hgb urine dipstick NEGATIVE NEGATIVE   Bilirubin Urine NEGATIVE NEGATIVE   Ketones, ur NEGATIVE NEGATIVE mg/dL   Protein, ur NEGATIVE NEGATIVE mg/dL   Nitrite NEGATIVE NEGATIVE   Leukocytes, UA SMALL (A) NEGATIVE  Urine microscopic-add on     Status: Abnormal   Collection Time: 03/25/15 11:32 AM  Result Value Ref Range   Squamous Epithelial / LPF 6-30 (A) NONE SEEN    Comment: Please note change in reference range.   WBC, UA 6-30 0 - 5 WBC/hpf    Comment: Please note change in reference range.  RBC / HPF 0-5 0 - 5 RBC/hpf    Comment: Please note change in reference range.   Bacteria, UA FEW (A) NONE SEEN    Comment: Please note change in reference range.    Review of Systems  Constitutional: Negative for fever and chills.  Gastrointestinal: Positive for abdominal pain. Negative for nausea and vomiting.   Physical Exam   Blood pressure 107/56, pulse 79, temperature 98.2 F (36.8 C), temperature source Oral, resp. rate 16, height  (1.575 m), weight 180 lb 6 oz (81.818 kg), last menstrual period 10/22/2014.  Physical Exam  Constitutional: She is oriented to person, place, and time. She appears well-developed and well-nourished. No distress.  HENT:  Head: Normocephalic.  Eyes: Pupils are equal, round, and reactive  to light.  Neck: Neck supple.  GI: Soft. There is no tenderness.  Musculoskeletal: Normal range of motion.  Neurological: She is alert and oriented to person, place, and time.  Skin: Skin is warm. She is not diaphoretic.  Psychiatric: Her mood appears anxious. She is withdrawn. She exhibits a depressed mood.   Fetal Tracing: Baseline: 135 bmp Variability: moderate Accelerations: 10x10  Decelerations: quick variables Difficult to trace due to gestation.  Toco: 1 contraction noted with UI  MAU Course  Procedures  None  MDM  Consulted with Dr. Senaida Ores.  Procardia 10 mg times one dose. Patient feels the procardia helps with the contractions like pain, however she continues to feel pelvic pressure.  Betamethasone    Assessment and Plan   A:  1. Risk of preterm labor, second trimester   2. Pelvic pressure in pregnancy, second trimester    P:  Discharge home in stable condition Pelvic rest Return to MAU in 24 hours for 2nd betamethasone injection Preterm labor precautions Return to MAU if symptoms worsen  Pregnancy support belt   Duane Lope, NP 03/25/2015 5:44 PM

## 2015-03-25 NOTE — Discharge Instructions (Signed)

## 2015-03-26 ENCOUNTER — Inpatient Hospital Stay (HOSPITAL_COMMUNITY)
Admission: AD | Admit: 2015-03-26 | Discharge: 2015-03-26 | Disposition: A | Discharge status: Home / Self Care | Payer: Medicaid Other | Source: Ambulatory Visit | Attending: Obstetrics and Gynecology | Admitting: Obstetrics and Gynecology

## 2015-03-26 DIAGNOSIS — Z3A23 23 weeks gestation of pregnancy: Secondary | ICD-10-CM | POA: Diagnosis not present

## 2015-03-26 MED ORDER — BETAMETHASONE SOD PHOS & ACET 6 (3-3) MG/ML IJ SUSP
12.0000 mg | Freq: Once | INTRAMUSCULAR | Status: AC
  Administered 2015-03-26: 12 mg via INTRAMUSCULAR
  Filled 2015-03-26: qty 2

## 2015-04-14 ENCOUNTER — Inpatient Hospital Stay (HOSPITAL_COMMUNITY)
Admission: AD | Admit: 2015-04-14 | Discharge: 2015-04-14 | Disposition: A | Discharge status: Home / Self Care | Payer: Medicaid Other | Source: Ambulatory Visit | Attending: Obstetrics and Gynecology | Admitting: Obstetrics and Gynecology

## 2015-04-14 ENCOUNTER — Encounter (HOSPITAL_COMMUNITY): Payer: Self-pay

## 2015-04-14 DIAGNOSIS — O26892 Other specified pregnancy related conditions, second trimester: Secondary | ICD-10-CM

## 2015-04-14 DIAGNOSIS — Z3A26 26 weeks gestation of pregnancy: Secondary | ICD-10-CM | POA: Insufficient documentation

## 2015-04-14 DIAGNOSIS — O479 False labor, unspecified: Secondary | ICD-10-CM

## 2015-04-14 DIAGNOSIS — N898 Other specified noninflammatory disorders of vagina: Secondary | ICD-10-CM | POA: Diagnosis not present

## 2015-04-14 DIAGNOSIS — O4702 False labor before 37 completed weeks of gestation, second trimester: Secondary | ICD-10-CM | POA: Insufficient documentation

## 2015-04-14 LAB — URINALYSIS, ROUTINE W REFLEX MICROSCOPIC
Bilirubin Urine: NEGATIVE
Glucose, UA: NEGATIVE mg/dL
HGB URINE DIPSTICK: NEGATIVE
Ketones, ur: NEGATIVE mg/dL
LEUKOCYTES UA: NEGATIVE
NITRITE: NEGATIVE
PROTEIN: NEGATIVE mg/dL
Specific Gravity, Urine: >1.03 — ABNORMAL HIGH (ref 1.005–1.030)
pH: 6 (ref 5.0–8.0)

## 2015-04-14 LAB — AMNISURE RUPTURE OF MEMBRANE (ROM) NOT AT ARMC: AMNISURE: NEGATIVE

## 2015-04-14 NOTE — MAU Note (Signed)
Ctxs for past month or so. Today having a lot of ctxs and pelvic pressure, esp since 1600. Some white, mucousy d/c. Taking medicine for ctxs

## 2015-04-14 NOTE — Discharge Instructions (Signed)
Braxton Hicks Contractions Contractions of the uterus can occur throughout pregnancy. Contractions are not always a sign that you are in labor.  WHAT ARE BRAXTON HICKS CONTRACTIONS?  Contractions that occur before labor are called Braxton Hicks contractions, or false labor. Toward the end of pregnancy (32-34 weeks), these contractions can develop more often and may become more forceful. This is not true labor because these contractions do not result in opening (dilatation) and thinning of the cervix. They are sometimes difficult to tell apart from true labor because these contractions can be forceful and people have different pain tolerances. You should not feel embarrassed if you go to the hospital with false labor. Sometimes, the only way to tell if you are in true labor is for your health care provider to look for changes in the cervix. If there are no prenatal problems or other health problems associated with the pregnancy, it is completely safe to be sent home with false labor and await the onset of true labor. HOW CAN YOU TELL THE DIFFERENCE BETWEEN TRUE AND FALSE LABOR? False Labor  The contractions of false labor are usually shorter and not as hard as those of true labor.   The contractions are usually irregular.   The contractions are often felt in the front of the lower abdomen and in the groin.   The contractions may go away when you walk around or change positions while lying down.   The contractions get weaker and are shorter lasting as time goes on.   The contractions do not usually become progressively stronger, regular, and closer together as with true labor.  True Labor  Contractions in true labor last 30-70 seconds, become very regular, usually become more intense, and increase in frequency.   The contractions do not go away with walking.   The discomfort is usually felt in the top of the uterus and spreads to the lower abdomen and low back.   True labor can be  determined by your health care provider with an exam. This will show that the cervix is dilating and getting thinner.  WHAT TO REMEMBER  Keep up with your usual exercises and follow other instructions given by your health care provider.   Take medicines as directed by your health care provider.   Keep your regular prenatal appointments.   Eat and drink lightly if you think you are going into labor.   If Braxton Hicks contractions are making you uncomfortable:   Change your position from lying down or resting to walking, or from walking to resting.   Sit and rest in a tub of warm water.   Drink 2-3 glasses of water. Dehydration may cause these contractions.   Do slow and deep breathing several times an hour.  WHEN SHOULD I SEEK IMMEDIATE MEDICAL CARE? Seek immediate medical care if:  Your contractions become stronger, more regular, and closer together.   You have fluid leaking or gushing from your vagina.   You have a fever.   You pass blood-tinged mucus.   You have vaginal bleeding.   You have continuous abdominal pain.   You have low back pain that you never had before.   You feel your baby's head pushing down and causing pelvic pressure.   Your baby is not moving as much as it used to.    This information is not intended to replace advice given to you by your health care provider. Make sure you discuss any questions you have with your health care  provider.   Document Released: 04/22/2005 Document Revised: 04/27/2013 Document Reviewed: 02/01/2013 Elsevier Interactive Patient Education 2016 [employer]  Pelvic Rest Pelvic rest is sometimes recommended for women when:   The placenta is partially or completely covering the opening of the cervix (placenta previa).  There is bleeding between the uterine wall and the amniotic sac in the first trimester (subchorionic hemorrhage).  The cervix begins to open without labor starting (incompetent  cervix, cervical insufficiency).  The labor is too early (preterm labor). HOME CARE INSTRUCTIONS  Do not have sexual intercourse, stimulation, or an orgasm.  Do not use tampons, douche, or put anything in the vagina.  Do not lift anything over 10 pounds (4.5 kg).  Avoid strenuous activity or straining your pelvic muscles. SEEK MEDICAL CARE IF:  You have any vaginal bleeding during pregnancy. Treat this as a potential emergency.  You have cramping pain felt low in the stomach (stronger than menstrual cramps).  You notice vaginal discharge (watery, mucus, or bloody).  You have a low, dull backache.  There are regular contractions or uterine tightening. SEEK IMMEDIATE MEDICAL CARE IF: You have vaginal bleeding and have placenta previa.    This information is not intended to replace advice given to you by your health care provider. Make sure you discuss any questions you have with your health care provider.   Document Released: 08/17/2010 Document Revised: 07/15/2011 Document Reviewed: 10/24/2014 Elsevier Interactive Patient Education [employer]

## 2015-04-14 NOTE — MAU Provider Note (Signed)
History     CSN: [id number]  Arrival date and time: 04/14/15 2036   None     Chief Complaint  Patient presents with  . Contractions  . Vaginal Discharge   HPI    [name] is a 39 y.o. female [phone] at [id number] presenting to MAU with concerns of contractions. 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 [facility] month showed a 3.1 cm cervical length. The patient has presented multiple times with complaints of contractions. She had a + FFN at 22 weeks and received betamethasone.  She is currently using procardia at home.  Last procardia was at 6 pm; the patient rates her pain currently a 6/10. The pain comes and goes like contraction pain.   She has been using Vaginal progesterone since Oct/Nov. She has noticed a moderate amount of clear, fluid that made her pants wet. After she changed her pants she noticed clear mucus like discharge. She is here tonight to make sure her water is not broke.    OB History    Gravida Para Term Preterm AB TAB SAB Ectopic Multiple Living   5 3 3  1  1   3       Past Medical History  Diagnosis Date  . Colitis   . Hypothyroid     Past Surgical History  Procedure Laterality Date  . No past surgeries      Family History  Problem Relation Age of Onset  . Heart disease Father   . Cancer Maternal Aunt   . Cancer Maternal Grandmother   . Heart disease Paternal Grandfather     Social History  Substance Use Topics  . Smoking status: Never Smoker   . Smokeless tobacco: Never Used  . Alcohol Use: No    Allergies: No Known Allergies  Prescriptions prior to admission  Medication Sig Dispense Refill Last Dose  . acetaminophen-codeine (TYLENOL #3) 300-30 MG tablet Take 1 tablet by mouth every 6 (six) hours as needed for moderate pain. 30 tablet 0 03/25/2015 at Unknown time  . levothyroxine (SYNTHROID, LEVOTHROID) 100 MCG tablet Take 100 mcg by mouth daily before breakfast.     03/25/2015 at Unknown time  . NIFEdipine (PROCARDIA) 10 MG capsule Take 1 capsule (10 mg total) by mouth 3 (three) times daily as needed (As needed for contractions). 90 capsule 0   . Prenatal Vit-Fe Fumarate-FA (PRENATAL VITAMIN PO) Take 1 tablet by mouth every evening.   03/24/2015 at Unknown time  . sulfamethoxazole-trimethoprim (BACTRIM DS,SEPTRA DS) 800-160 MG tablet Take 1 tablet by mouth 2 (two) times daily.   03/20/2015   Results for orders placed or performed during the hospital encounter of 04/14/15 (from the past 48 hour(s))  Urinalysis, Routine w reflex microscopic (not at [facility])     Status: Abnormal   Collection Time: 04/14/15  9:00 PM  Result Value Ref Range   Color, Urine YELLOW YELLOW   APPearance CLEAR CLEAR   Specific Gravity, Urine >1.030 (H) 1.005 - 1.030   pH 6.0 5.0 - 8.0   Glucose, UA NEGATIVE NEGATIVE mg/dL   Hgb urine dipstick NEGATIVE NEGATIVE   Bilirubin Urine NEGATIVE NEGATIVE   Ketones, ur NEGATIVE NEGATIVE mg/dL   Protein, ur NEGATIVE NEGATIVE mg/dL   Nitrite NEGATIVE NEGATIVE   Leukocytes, UA NEGATIVE NEGATIVE    Comment: MICROSCOPIC NOT DONE ON URINES WITH NEGATIVE PROTEIN, BLOOD, LEUKOCYTES, NITRITE, OR GLUCOSE <1000 mg/dL.  Amnisure  rupture of membrane (rom)not at [facility]     Status: None   Collection Time: 04/14/15  9:46 PM  Result Value Ref Range   Amnisure ROM NEGATIVE     Review of Systems  Constitutional: Negative for fever and chills.  Gastrointestinal: Negative for nausea, vomiting and abdominal pain.   Physical Exam   Blood pressure 105/63, pulse 75, temperature 97.6 F (36.4 C), temperature source Oral, resp. rate 17, height  (1.575 m), weight 187 lb 12.8 oz (85.186 kg), last menstrual period 10/22/2014.  Physical Exam  Constitutional: She is oriented to person, place, and time. She appears well-developed and well-nourished. No distress.  HENT:  Head: Normocephalic.  Eyes: Pupils are equal, round, and reactive to light.  Neck:  Neck supple.  GI: Soft.  Genitourinary:  Cervix: closed, thick, anterior   Musculoskeletal: Normal range of motion.  Neurological: She is alert and oriented to person, place, and time.  Skin: Skin is warm. She is not diaphoretic.  Psychiatric: Her mood appears anxious.   Fetal Tracing: Baseline: 140 bpm  Variability: Moderate  Accelerations: 10x10 Decelerations: quick variables  Toco:  Quiet   MAU Course  Procedures  None  MDM  Urine shows mild dehydration; patient needs to increase PO fluids PO fluids pushed while in MAU. Marland Kitchen  Discussed patient with Dr. Jackelyn Knife.   Assessment and Plan   A:  1. Braxton Hicks contractions   2. Threatened preterm labor, second trimester   3. Vaginal discharge in pregnancy, second trimester     P:  Discharge home in stable condition Continue procardia at home Preterm labor precautions Pelvic rest Return to MAU if symptoms worsen  Fetal kick counts  Patient encouraged to call the on call provider prior to coming to MAU   Duane Lope, NP 04/14/2015 9:32 PM

## 2015-05-01 ENCOUNTER — Inpatient Hospital Stay (HOSPITAL_COMMUNITY)
Admission: AD | Admit: 2015-05-01 | Discharge: 2015-05-01 | Disposition: A | Discharge status: Home / Self Care | Payer: Medicaid Other | Source: Ambulatory Visit | Attending: Obstetrics and Gynecology | Admitting: Obstetrics and Gynecology

## 2015-05-01 ENCOUNTER — Encounter (HOSPITAL_COMMUNITY): Payer: Self-pay | Admitting: *Deleted

## 2015-05-01 DIAGNOSIS — E039 Hypothyroidism, unspecified: Secondary | ICD-10-CM | POA: Diagnosis not present

## 2015-05-01 DIAGNOSIS — O99283 Endocrine, nutritional and metabolic diseases complicating pregnancy, third trimester: Secondary | ICD-10-CM | POA: Insufficient documentation

## 2015-05-01 DIAGNOSIS — O9989 Other specified diseases and conditions complicating pregnancy, childbirth and the puerperium: Secondary | ICD-10-CM | POA: Diagnosis not present

## 2015-05-01 DIAGNOSIS — Z79899 Other long term (current) drug therapy: Secondary | ICD-10-CM | POA: Diagnosis not present

## 2015-05-01 DIAGNOSIS — M545 Low back pain: Secondary | ICD-10-CM | POA: Insufficient documentation

## 2015-05-01 DIAGNOSIS — Z3A28 28 weeks gestation of pregnancy: Secondary | ICD-10-CM | POA: Diagnosis not present

## 2015-05-01 DIAGNOSIS — O4703 False labor before 37 completed weeks of gestation, third trimester: Secondary | ICD-10-CM

## 2015-05-01 DIAGNOSIS — N939 Abnormal uterine and vaginal bleeding, unspecified: Secondary | ICD-10-CM | POA: Diagnosis present

## 2015-05-01 DIAGNOSIS — O4693 Antepartum hemorrhage, unspecified, third trimester: Secondary | ICD-10-CM | POA: Diagnosis not present

## 2015-05-01 LAB — URINE MICROSCOPIC-ADD ON

## 2015-05-01 LAB — URINALYSIS, ROUTINE W REFLEX MICROSCOPIC
Bilirubin Urine: NEGATIVE
Glucose, UA: NEGATIVE mg/dL
KETONES UR: 15 mg/dL — AB
NITRITE: NEGATIVE
PROTEIN: NEGATIVE mg/dL
Specific Gravity, Urine: 1.025 (ref 1.005–1.030)
pH: 6 (ref 5.0–8.0)

## 2015-05-01 NOTE — MAU Provider Note (Signed)
History     CSN: [id number]  Arrival date and time: 05/01/15 1548   First Provider Initiated Contact with Patient 05/01/15 1623       Chief Complaint  Patient presents with  . Vaginal Bleeding  . Contractions   HPI  Carol Welch is a 39 y.o. [id number] at [redacted]w[redacted]d who presents with low back pain & vaginal bleeding.  Has been taking vistiril & procardia for PTL since 22 wks. Also rx progesterone vaginally but hasn't used since last Thursday.  Had episode of painful contractions this morning at 4 am. Woke up with pain & feeling wet. Turned on the light and noted large amount of blood on clothes & sheets. States bleeding has continued to decrease since then and is now just brown spotting on toilet paper.  Reports some lower back pain now.  Denies LOF.  Reports decreased FM earlier today, but now is normal.  Denies n/v/d/constipation, or urinary complaints.  No intercourse since 22 weeks.     OB History    Gravida Para Term Preterm AB TAB SAB Ectopic Multiple Living   Past Medical History  Diagnosis Date  . Colitis   . Hypothyroid     Past Surgical History  Procedure Laterality Date  . No past surgeries      Family History  Problem Relation Age of Onset  . Heart disease Father   . Cancer Maternal Aunt   . Cancer Maternal Grandmother   . Heart disease Paternal Grandfather     Social History  Substance Use Topics  . Smoking status: Never Smoker   . Smokeless tobacco: Never Used  . Alcohol Use: No    Allergies: No Known Allergies  Prescriptions prior to admission  Medication Sig Dispense Refill Last Dose  . acetaminophen-codeine (TYLENOL #3) 300-30 MG tablet Take 1 tablet by mouth every 6 (six) hours as needed for moderate pain. 30 tablet 0 03/25/2015 at Unknown time  . levothyroxine (SYNTHROID, LEVOTHROID) 100 MCG tablet Take 100 mcg by mouth daily before breakfast.    03/25/2015 at Unknown time  . NIFEdipine (PROCARDIA) 10 MG capsule Take 1  capsule (10 mg total) by mouth 3 (three) times daily as needed (As needed for contractions). 90 capsule 0   . Prenatal Vit-Fe Fumarate-FA (PRENATAL VITAMIN PO) Take 1 tablet by mouth every evening.   03/24/2015 at Unknown time  . sulfamethoxazole-trimethoprim (BACTRIM DS,SEPTRA DS) 800-160 MG tablet Take 1 tablet by mouth 2 (two) times daily.   03/20/2015    Review of Systems  Constitutional: Negative.   Gastrointestinal: Negative.   Genitourinary:       + vaginal bleeding  Musculoskeletal: Positive for back pain.   Physical Exam   Blood pressure 94/78, pulse 87, temperature 98.2 F (36.8 C), temperature source Oral, resp. rate 18, weight 187 lb 12.8 oz (85.186 kg), last menstrual period 10/22/2014.  Physical Exam  Nursing note and vitals reviewed. Constitutional: She is oriented to person, place, and time. She appears well-developed and well-nourished. No distress.  HENT:  Head: Normocephalic and atraumatic.  Eyes: Conjunctivae are normal. Right eye exhibits no discharge. Left eye exhibits no discharge. No scleral icterus.  Neck: Normal range of motion.  Cardiovascular: Normal rate, regular rhythm and normal heart sounds.   No murmur heard. Respiratory: Effort normal and breath sounds normal. No respiratory distress. She has no wheezes.  GI: Soft.  Genitourinary: Vagina normal. Uterus is enlarged (  appropriate for gestation). Discharge: minimal amount of white discharge.  No blood on exam  Neurological: She is alert and oriented to person, place, and time.  Skin: Skin is warm and dry. She is not diaphoretic.  Psychiatric: She has a normal mood and affect. Her behavior is normal. Judgment and thought content normal.   Dilation: Closed (Ext. os FT) Effacement (%): Thick Cervical Position: Posterior Exam by:: Estanislado SpireE. Maliha Outten, NP  Fetal Tracing:  Baseline: 145 Variability: moderate Accelerations: 15x15 Decelerations: none  Toco: UI   MAU Course  Procedures Results for  orders placed or performed during the hospital encounter of 05/01/15 (from the past 24 hour(s))  Urinalysis, Routine w reflex microscopic ([facility])     Status: Abnormal   Collection Time: 05/01/15  4:00 PM  Result Value Ref Range   Color, Urine YELLOW YELLOW   APPearance HAZY (A) CLEAR   Specific Gravity, Urine 1.025 1.005 - 1.030   pH 6.0 5.0 - 8.0   Glucose, UA NEGATIVE NEGATIVE mg/dL   Hgb urine dipstick LARGE (A) NEGATIVE   Bilirubin Urine NEGATIVE NEGATIVE   Ketones, ur 15 (A) NEGATIVE mg/dL   Protein, ur NEGATIVE NEGATIVE mg/dL   Nitrite NEGATIVE NEGATIVE   Leukocytes, UA SMALL (A) NEGATIVE  Urine microscopic-add on     Status: Abnormal   Collection Time: 05/01/15  4:00 PM  Result Value Ref Range   Squamous Epithelial / LPF 6-30 (A) NONE SEEN   WBC, UA 6-30 0 - 5 WBC/hpf   RBC / HPF 0-5 0 - 5 RBC/hpf   Bacteria, UA MANY (A) NONE SEEN   Urine-Other MUCOUS PRESENT     MDM Reactive tracing Cervix closed Irregular UI & few ctx on monitor No blood on exam 1644- S/w Dr. Senaida Oresichardson. Continue taking meds as prescribed. Ok to discharge home.   Assessment and Plan  A: 1. Preterm contractions, third trimester   2. Vaginal bleeding in pregnancy, third trimester    P: Discharge home Preterm labor precautions & fetal kick counts Keep scheduled ob appts Continue taking vistaril, procardia, & progesterone as prescribed.  Increase water intake  Judeth HornErin Arshia Spellman, NP  05/01/2015, 4:19 PM

## 2015-05-01 NOTE — Discharge Instructions (Signed)
Preterm Labor Information °Preterm labor is when labor starts at less than 37 weeks of pregnancy. The normal length of a pregnancy is 39 to 41 weeks. °CAUSES °Often, there is no identifiable underlying cause as to why a woman goes into preterm labor. One of the most common known causes of preterm labor is infection. Infections of the uterus, cervix, vagina, amniotic sac, bladder, kidney, or even the lungs (pneumonia) can cause labor to start. Other suspected causes of preterm labor include:  °· Urogenital infections, such as yeast infections and bacterial vaginosis.   °· Uterine abnormalities (uterine shape, uterine septum, fibroids, or bleeding from the placenta).   °· A cervix that has been operated on (it may fail to stay closed).   °· Malformations in the fetus.   °· Multiple gestations (twins, triplets, and so on).   °· Breakage of the amniotic sac.   °RISK FACTORS °1. Having a previous history of preterm labor.   °2. Having premature rupture of membranes (PROM).   °3. Having a placenta that covers the opening of the cervix (placenta previa).   °4. Having a placenta that separates from the uterus (placental abruption).   °5. Having a cervix that is too weak to hold the fetus in the uterus (incompetent cervix).   °6. Having too much fluid in the amniotic sac (polyhydramnios).   °7. Taking illegal drugs or smoking while pregnant.   °8. Not gaining enough weight while pregnant.   °9. Being younger than 18 and older than 38 years old.   °10. Having a low socioeconomic status.   °11. Being African American. °SYMPTOMS °Signs and symptoms of preterm labor include:  °· Menstrual-like cramps, abdominal pain, or back pain. °· Uterine contractions that are regular, as frequent as six in an hour, regardless of their intensity (may be mild or painful). °· Contractions that start on the top of the uterus and spread down to the lower abdomen and back.   °· A sense of increased pelvic pressure.   °· A watery or bloody mucus  discharge that comes from the vagina.   °TREATMENT °Depending on the length of the pregnancy and other circumstances, your health care provider may suggest bed rest. If necessary, there are medicines that can be given to stop contractions and to mature the fetal lungs. If labor happens before 34 weeks of pregnancy, a prolonged hospital stay may be recommended. Treatment depends on the condition of both you and the fetus.  °WHAT SHOULD YOU DO IF YOU THINK YOU ARE IN PRETERM LABOR? °Call your health care provider right away. You will need to go to the hospital to get checked immediately. °HOW CAN YOU PREVENT PRETERM LABOR IN FUTURE PREGNANCIES? °You should:  °· Stop smoking if you smoke.  °· Maintain healthy weight gain and avoid chemicals and drugs that are not necessary. °· Be watchful for any type of infection. °· Inform your health care provider if you have a known history of preterm labor. °  °This information is not intended to replace advice given to you by your health care provider. Make sure you discuss any questions you have with your health care provider. °  °Document Released: 07/13/2003 Document Revised: 12/23/2012 Document Reviewed: 05/25/2012 °Elsevier Interactive Patient Education ©2016 [employer] °Fetal Movement Counts °Patient Name: __________________________________________________ Patient Due Date: ____________________ °Performing a fetal movement count is highly recommended in high-risk pregnancies, but it is good for every pregnant woman to do. Your health care provider may ask you to start counting fetal movements at 28 weeks of the pregnancy. Fetal movements often increase: °· After eating a full meal. °·   After physical activity. °· After eating or drinking something sweet or cold. °· At rest. °Pay attention to when you feel the baby is most active. This will help you notice a pattern of your baby's sleep and wake cycles and what factors contribute to an increase in fetal movement. It is  important to perform a fetal movement count at the same time each day when your baby is normally most active.  °HOW TO COUNT FETAL MOVEMENTS °12. Find a quiet and comfortable area to sit or lie down on your left side. Lying on your left side provides the best blood and oxygen circulation to your baby. °13. Write down the day and time on a sheet of paper or in a journal. °14. Start counting kicks, flutters, swishes, rolls, or jabs in a 2-hour period. You should feel at least 10 movements within 2 hours. °15. If you do not feel 10 movements in 2 hours, wait 2-3 hours and count again. Look for a change in the pattern or not enough counts in 2 hours. °SEEK MEDICAL CARE IF: °· You feel less than 10 counts in 2 hours, tried twice. °· There is no movement in over an hour. °· The pattern is changing or taking longer each day to reach 10 counts in 2 hours. °· You feel the baby is not moving as he or she usually does. °Date: ____________ Movements: ____________ Start time: ____________ Finish time: ____________  °Date: ____________ Movements: ____________ Start time: ____________ Finish time: ____________ °Date: ____________ Movements: ____________ Start time: ____________ Finish time: ____________ °Date: ____________ Movements: ____________ Start time: ____________ Finish time: ____________ °Date: ____________ Movements: ____________ Start time: ____________ Finish time: ____________ °Date: ____________ Movements: ____________ Start time: ____________ Finish time: ____________ °Date: ____________ Movements: ____________ Start time: ____________ Finish time: ____________ °Date: ____________ Movements: ____________ Start time: ____________ Finish time: ____________  °Date: ____________ Movements: ____________ Start time: ____________ Finish time: ____________ °Date: ____________ Movements: ____________ Start time: ____________ Finish time: ____________ °Date: ____________ Movements: ____________ Start time: ____________ Finish  time: ____________ °Date: ____________ Movements: ____________ Start time: ____________ Finish time: ____________ °Date: ____________ Movements: ____________ Start time: ____________ Finish time: ____________ °Date: ____________ Movements: ____________ Start time: ____________ Finish time: ____________ °Date: ____________ Movements: ____________ Start time: ____________ Finish time: ____________  °Date: ____________ Movements: ____________ Start time: ____________ Finish time: ____________ °Date: ____________ Movements: ____________ Start time: ____________ Finish time: ____________ °Date: ____________ Movements: ____________ Start time: ____________ Finish time: ____________ °Date: ____________ Movements: ____________ Start time: ____________ Finish time: ____________ °Date: ____________ Movements: ____________ Start time: ____________ Finish time: ____________ °Date: ____________ Movements: ____________ Start time: ____________ Finish time: ____________ °Date: ____________ Movements: ____________ Start time: ____________ Finish time: ____________  °Date: ____________ Movements: ____________ Start time: ____________ Finish time: ____________ °Date: ____________ Movements: ____________ Start time: ____________ Finish time: ____________ °Date: ____________ Movements: ____________ Start time: ____________ Finish time: ____________ °Date: ____________ Movements: ____________ Start time: ____________ Finish time: ____________ °Date: ____________ Movements: ____________ Start time: ____________ Finish time: ____________ °Date: ____________ Movements: ____________ Start time: ____________ Finish time: ____________ °Date: ____________ Movements: ____________ Start time: ____________ Finish time: ____________  °Date: ____________ Movements: ____________ Start time: ____________ Finish time: ____________ °Date: ____________ Movements: ____________ Start time: ____________ Finish time: ____________ °Date: ____________  Movements: ____________ Start time: ____________ Finish time: ____________ °Date: ____________ Movements: ____________ Start time: ____________ Finish time: ____________ °Date: ____________ Movements: ____________ Start time: ____________ Finish time: ____________ °Date: ____________ Movements: ____________ Start time: ____________ Finish time: ____________ °Date: ____________ Movements: ____________ Start time: ____________   Finish time: ____________  °Date: ____________ Movements: ____________ Start time: ____________ Finish time: ____________ °Date: ____________ Movements: ____________ Start time: ____________ Finish time: ____________ °Date: ____________ Movements: ____________ Start time: ____________ Finish time: ____________ °Date: ____________ Movements: ____________ Start time: ____________ Finish time: ____________ °Date: ____________ Movements: ____________ Start time: ____________ Finish time: ____________ °Date: ____________ Movements: ____________ Start time: ____________ Finish time: ____________ °Date: ____________ Movements: ____________ Start time: ____________ Finish time: ____________  °Date: ____________ Movements: ____________ Start time: ____________ Finish time: ____________ °Date: ____________ Movements: ____________ Start time: ____________ Finish time: ____________ °Date: ____________ Movements: ____________ Start time: ____________ Finish time: ____________ °Date: ____________ Movements: ____________ Start time: ____________ Finish time: ____________ °Date: ____________ Movements: ____________ Start time: ____________ Finish time: ____________ °Date: ____________ Movements: ____________ Start time: ____________ Finish time: ____________ °Date: ____________ Movements: ____________ Start time: ____________ Finish time: ____________  °Date: ____________ Movements: ____________ Start time: ____________ Finish time: ____________ °Date: ____________ Movements: ____________ Start time:  ____________ Finish time: ____________ °Date: ____________ Movements: ____________ Start time: ____________ Finish time: ____________ °Date: ____________ Movements: ____________ Start time: ____________ Finish time: ____________ °Date: ____________ Movements: ____________ Start time: ____________ Finish time: ____________ °Date: ____________ Movements: ____________ Start time: ____________ Finish time: ____________ °  °This information is not intended to replace advice given to you by your health care provider. Make sure you discuss any questions you have with your health care provider. °  °Document Released: 05/22/2006 Document Revised: 05/13/2014 Document Reviewed: 02/17/2012 °Elsevier Interactive Patient Education ©2016 [employer] Inc[employer] ° °

## 2015-05-01 NOTE — MAU Note (Signed)
Has been on meds for PTL.  At 0400 was having really bad contractions, noted wetness, was blood- on bed linens and clothing.  Has gotten lighter, seeing when wipes.  Called office, was told to come in.

## 2015-05-07 NOTE — L&D Delivery Note (Signed)
Delivery Note At 8:52 PM a viable and healthy female was delivered via Vaginal, Spontaneous Delivery (Presentation: Left Occiput Anterior).  APGAR: 9, 9; weight  P.   Placenta status: Intact, Spontaneous.  Cord: 3V  with the following complications: none.    Anesthesia: Epidural  Episiotomy: None Lacerations: Labial Suture Repair: 3.0 monocryl Est. Blood Loss (mL):  150  Mom to postpartum.  Baby to Couplet care / Skin to Skin.  Bovard-Stuckert, Delcie Ruppert 06/30/2015, 9:11 PM  D/W pt and FOB circumcision for female infant including r/b/a  A+/RI/Br/Tdap in PNC/Contra?

## 2015-05-31 ENCOUNTER — Inpatient Hospital Stay (HOSPITAL_COMMUNITY)
Admission: AD | Admit: 2015-05-31 | Discharge: 2015-05-31 | Disposition: A | Discharge status: Home / Self Care | Payer: Medicaid Other | Source: Ambulatory Visit | Attending: Obstetrics and Gynecology | Admitting: Obstetrics and Gynecology

## 2015-05-31 ENCOUNTER — Encounter (HOSPITAL_COMMUNITY): Payer: Self-pay | Admitting: *Deleted

## 2015-05-31 DIAGNOSIS — Z79899 Other long term (current) drug therapy: Secondary | ICD-10-CM | POA: Diagnosis not present

## 2015-05-31 DIAGNOSIS — E039 Hypothyroidism, unspecified: Secondary | ICD-10-CM | POA: Diagnosis not present

## 2015-05-31 DIAGNOSIS — O99283 Endocrine, nutritional and metabolic diseases complicating pregnancy, third trimester: Secondary | ICD-10-CM | POA: Diagnosis not present

## 2015-05-31 DIAGNOSIS — Z3A32 32 weeks gestation of pregnancy: Secondary | ICD-10-CM | POA: Insufficient documentation

## 2015-05-31 DIAGNOSIS — O4703 False labor before 37 completed weeks of gestation, third trimester: Secondary | ICD-10-CM | POA: Diagnosis not present

## 2015-05-31 LAB — URINALYSIS, ROUTINE W REFLEX MICROSCOPIC
BILIRUBIN URINE: NEGATIVE
Glucose, UA: NEGATIVE mg/dL
Hgb urine dipstick: NEGATIVE
KETONES UR: NEGATIVE mg/dL
NITRITE: NEGATIVE
PROTEIN: NEGATIVE mg/dL
Specific Gravity, Urine: 1.01 (ref 1.005–1.030)
pH: 5 (ref 5.0–8.0)

## 2015-05-31 LAB — URINE MICROSCOPIC-ADD ON
Bacteria, UA: NONE SEEN
RBC / HPF: NONE SEEN RBC/hpf (ref 0–5)

## 2015-05-31 MED ORDER — BETAMETHASONE SOD PHOS & ACET 6 (3-3) MG/ML IJ SUSP
12.0000 mg | Freq: Once | INTRAMUSCULAR | Status: DC
  Filled 2015-05-31: qty 2

## 2015-05-31 MED ORDER — BETAMETHASONE SOD PHOS & ACET 6 (3-3) MG/ML IJ SUSP: 12.0000 mg | Freq: Once | INTRAMUSCULAR | Status: DC

## 2015-05-31 MED ORDER — LACTATED RINGERS IV BOLUS (SEPSIS)
1000.0000 mL | Freq: Once | INTRAVENOUS | Status: AC
  Administered 2015-05-31: 1000 mL via INTRAVENOUS

## 2015-05-31 NOTE — Discharge Instructions (Signed)
Return to MAU tomorrow evening for 2nd steroid shot. Make an appointment for Friday to be seen in the office. Drink 8-10 glasses of water per day. Take procardia and benadryl as instructed. Call the office or MD on call with further concerns.

## 2015-05-31 NOTE — MAU Note (Addendum)
Sent from office. Was 2cm. Took a dose of procardia.

## 2015-05-31 NOTE — MAU Provider Note (Signed)
Chief Complaint:  R/O PTL    First Provider Initiated Contact with Patient 05/31/15 1815      HPI: Carol Welch is a 40 y.o. [id number] at 69w6dwho presents to maternity admissions sent from the office for contractions with cervical dilation since last visit. She reports contractions every 5 minutes, some stronger than others, x several hours today.  Pt on procardia for cramping/contractions and took her medication after her prenatal visit before coming to MAU. Her contractions are less often per the pt now than when she was seen today.   She reports good fetal movement, denies LOF, vaginal bleeding, vaginal itching/burning, urinary symptoms, h/a, dizziness, n/v, or fever/chills.    HPI  Past Medical History: Past Medical History  Diagnosis Date  . Colitis   . Hypothyroid     Past obstetric history: OB History  Gravida Para Term Preterm AB SAB TAB Ectopic Multiple Living  # Outcome Date GA Lbr Len/2nd Weight Sex Delivery Anes PTL Lv  5 Current           4 Term           3 Term           2 Term           1 SAB               Past Surgical History: Past Surgical History  Procedure Laterality Date  . No past surgeries      Family History: Family History  Problem Relation Age of Onset  . Heart disease Father   . Cancer Maternal Aunt   . Cancer Maternal Grandmother   . Heart disease Paternal Grandfather     Social History: Social History  Substance Use Topics  . Smoking status: Never Smoker   . Smokeless tobacco: Never Used  . Alcohol Use: No    Allergies: No Known Allergies  Meds:  Prescriptions prior to admission  Medication Sig Dispense Refill Last Dose  . acetaminophen-codeine (TYLENOL #3) 300-30 MG tablet Take 1 tablet by mouth every 6 (six) hours as needed for moderate pain. 30 tablet 0 05/31/2015 at Unknown time  . hydrOXYzine (ATARAX/VISTARIL) 25 MG tablet Take 25 mg by mouth every 8 (eight) hours as needed for itching.   1 05/30/2015 at  Unknown time  . IRON PO Take 1 tablet by mouth daily.   05/30/2015 at Unknown time  . levothyroxine (SYNTHROID, LEVOTHROID) 100 MCG tablet Take 100 mcg by mouth daily before breakfast.    05/31/2015 at Unknown time  . NIFEdipine (PROCARDIA) 10 MG capsule Take 1 capsule (10 mg total) by mouth 3 (three) times daily as needed (As needed for contractions). 90 capsule 0 05/31/2015 at Unknown time  . Prenatal Vit-Fe Fumarate-FA (PRENATAL VITAMIN PO) Take 1 tablet by mouth every evening.   05/30/2015 at Unknown time    ROS:  Review of Systems  Constitutional: Negative for fever, chills and fatigue.  Eyes: Negative for visual disturbance.  Respiratory: Negative for shortness of breath.   Cardiovascular: Negative for chest pain.  Gastrointestinal: Positive for abdominal pain. Negative for nausea and vomiting.  Genitourinary: Negative for dysuria, flank pain, vaginal bleeding, vaginal discharge, difficulty urinating, vaginal pain and pelvic pain.  Neurological: Negative for dizziness and headaches.  Psychiatric/Behavioral: Negative.      I have reviewed patient's Past Medical Hx, Surgical Hx, Family Hx, Social Hx, medications and allergies.   Physical Exam  Patient Vitals for the past 24 hrs:  BP Temp Temp src Pulse Resp  05/31/15 1715 115/68 mmHg 97.8 F (36.6 C) Oral 101 18   Constitutional: Well-developed, well-nourished female in no acute distress.  Cardiovascular: normal rate Respiratory: normal effort GI: Abd soft, non-tender, gravid appropriate for gestational age.  MS: Extremities nontender, no edema, normal ROM Neurologic: Alert and oriented x 4.  GU: Neg CVAT.   Dilation: 1.5 Exam by:: Dr. Mindi Slicker  FHT:  Baseline 140 , moderate variability, accelerations present, no decelerations Contractions: q 10-15 mins with irritability in between   Labs: Results for orders placed or performed during the hospital encounter of 05/31/15 (from the past 24 hour(s))  Urinalysis, Routine w  reflex microscopic (not at [facility])     Status: Abnormal   Collection Time: 05/31/15  4:19 PM  Result Value Ref Range   Color, Urine STRAW (A) YELLOW   APPearance CLEAR CLEAR   Specific Gravity, Urine 1.010 1.005 - 1.030   pH 5.0 5.0 - 8.0   Glucose, UA NEGATIVE NEGATIVE mg/dL   Hgb urine dipstick NEGATIVE NEGATIVE   Bilirubin Urine NEGATIVE NEGATIVE   Ketones, ur NEGATIVE NEGATIVE mg/dL   Protein, ur NEGATIVE NEGATIVE mg/dL   Nitrite NEGATIVE NEGATIVE   Leukocytes, UA TRACE (A) NEGATIVE  Urine microscopic-add on     Status: Abnormal   Collection Time: 05/31/15  4:19 PM  Result Value Ref Range   Squamous Epithelial / LPF 0-5 (A) NONE SEEN   WBC, UA 0-5 0 - 5 WBC/hpf   RBC / HPF NONE SEEN 0 - 5 RBC/hpf   Bacteria, UA NONE SEEN NONE SEEN   --/--/A POS, A POS (11/12 2320)  Imaging:  No results found.  MAU Course/MDM: I have ordered labs and reviewed results.  Consult Dr Mindi Slicker, Dr Mindi Slicker in MAU and performed cervical exam for continuity.  No change in cervix since office visit today.  Will administer BMZ and discharge pt to home, with plan to return tomorrow for second dose. Pt to follow up in the office Friday for FFN. Treatments in MAU included IV fluids.  Preterm labor precautions/reasons to return to MAU given.  Pt stable at time of discharge.  Assessment: 1. Threatened preterm labor, third trimester     Plan: Discharge home Preterm labor precautions and fetal kick counts Return to MAU tomorrow at 6:30 pm for second dose of BMZ F/U in office on Friday Return to MAU as needed for emergencies     Medication List    ASK your doctor about these medications        acetaminophen-codeine 300-30 MG tablet  Commonly known as:  TYLENOL #3  Take 1 tablet by mouth every 6 (six) hours as needed for moderate pain.     hydrOXYzine 25 MG tablet  Commonly known as:  ATARAX/VISTARIL  Take 25 mg by mouth every 8 (eight) hours as needed for itching.     IRON PO  Take 1 tablet by mouth  daily.     levothyroxine 100 MCG tablet  Commonly known as:  SYNTHROID, LEVOTHROID  Take 100 mcg by mouth daily before breakfast.     NIFEdipine 10 MG capsule  Commonly known as:  PROCARDIA  Take 1 capsule (10 mg total) by mouth 3 (three) times daily as needed (As needed for contractions).     PRENATAL VITAMIN PO  Take 1 tablet by mouth every evening.        Sharen Counter Certified Nurse-Midwife 05/31/2015 6:35 PM

## 2015-06-14 LAB — OB RESULTS CONSOLE GBS: GBS: POSITIVE

## 2015-06-18 ENCOUNTER — Inpatient Hospital Stay (HOSPITAL_COMMUNITY)
Admission: AD | Admit: 2015-06-18 | Discharge: 2015-06-18 | Disposition: A | Discharge status: Home / Self Care | Payer: Medicaid Other | Source: Ambulatory Visit | Attending: Obstetrics and Gynecology | Admitting: Obstetrics and Gynecology

## 2015-06-18 DIAGNOSIS — Z3493 Encounter for supervision of normal pregnancy, unspecified, third trimester: Secondary | ICD-10-CM | POA: Insufficient documentation

## 2015-06-18 MED ORDER — NIFEDIPINE 10 MG PO CAPS
20.0000 mg | ORAL_CAPSULE | Freq: Once | ORAL | Status: AC
  Administered 2015-06-18: 20 mg via ORAL
  Filled 2015-06-18: qty 2

## 2015-06-18 NOTE — Discharge Instructions (Signed)
Informacin sobre el parto prematuro  (Preterm Labor Information) El parto prematuro comienza antes de la semana 37 de embarazo. La duracin de un embarazo normal es de 39 a 41 semanas.  CAUSAS  Generalmente no hay una causa que pueda identificarse del motivo por el que una mujer comienza un trabajo de parto prematuro. Sin embargo, una de las causas conocidas ms frecuentes son las infecciones. Las infecciones del tero, el cuello, la vagina, el lquido amnitico, la vejiga, los riones y hasta de los pulmones (neumona) pueden hacer que el trabajo de parto se inicie. Otras causas que pueden sospecharse son:   Infecciones urogenitales, como infecciones por hongos y vaginosis bacteriana.   Anormalidades uterinas (forma del tero, sptum uterino, fibromas, hemorragias en la placenta).   Un cuello que ha sido operado (puede ser que no permanezca cerrado).   Malformaciones del feto.   Gestaciones mltiples (mellizos, trillizos y ms).   Ruptura del saco amnitico.  FACTORES DE RIESGO   Historia previa de parto prematuro.   Tener ruptura prematura de las membranas (RPM).   La placenta cubre la abertura del cuello (placenta previa).   La placenta se separa del tero (abrupcin placentaria).   El cuello es demasiado dbil para contener al beb en el tero (cuello incompetente).   Hay mucho lquido en el saco amnitico (polihidramnios).   Consumo de drogas o hbito de fumar durante el embarazo.   No aumentar de peso lo suficiente durante el embarazo.   Mujeres menores de 18 aos o mayores de 35 aos.   Nivel socioeconmico bajo.   Pertenecer a la raza afroamericana. SNTOMAS  Los signos y sntomas del trabajo de parto prematuro son:   Clicos similares a los menstruales, dolor abdominal o dolor de espalda.  Contracciones uterinas regulares, tan frecuentes como seis por hora, sin importar su intensidad (pueden ser suaves o dolorosas).  Contracciones que comienzan  en la parte superior del tero y se expanden hacia abajo, a la zona inferior del abdomen y la espalda.   Sensacin de aumento de presin en la pelvis.   Aparece una secrecin acuosa o sanguinolenta por la vagina.  TRATAMIENTO  Segn el tiempo del embarazo y otras circunstancias, el mdico puede indicar reposo en cama. Si es necesario, le indicarn medicamentos para detener las contracciones y para madurar los pulmones del feto. Si el trabajo de parto se inicia antes de las 34 semanas de embarazo, se recomienda la hospitalizacin. El tratamiento depende de las condiciones en que se encuentren usted y el feto.  QU DEBE HACER SI PIENSA QUE EST EN TRABAJO DE PARTO PREMATURO?  Comunquese con su mdico inmediatamente. Debe concurrir al hospital para ser controlada inmediatamente.  CMO PUEDE EVITAR EL TRABAJO DE PARTO PREMATURO EN FUTUROS EMBARAZOS?  Usted debe:   Si fuma, abandonar el hbito.  Mantener un peso saludable y evitar sustancias qumicas y drogas innecesarias.  Controlar todo tipo de infeccin.  Informe a su mdico si tiene una historia conocida de trabajo de parto prematuro.   Esta informacin no tiene como fin reemplazar el consejo del mdico. Asegrese de hacerle al mdico cualquier pregunta que tenga.   Document Released: 07/30/2007 Document Revised: 12/23/2012 Elsevier Interactive Patient Education 2016 Elsevier Inc.  

## 2015-06-18 NOTE — MAU Note (Signed)
Pt states she has been contracting and the doctor wanted her to come in to be checked.

## 2015-06-23 ENCOUNTER — Inpatient Hospital Stay (HOSPITAL_COMMUNITY)
Admission: AD | Admit: 2015-06-23 | Discharge: 2015-06-23 | Disposition: A | Discharge status: Home / Self Care | Payer: Medicaid Other | Source: Ambulatory Visit | Attending: Obstetrics and Gynecology | Admitting: Obstetrics and Gynecology

## 2015-06-23 ENCOUNTER — Encounter (HOSPITAL_COMMUNITY): Payer: Self-pay | Admitting: *Deleted

## 2015-06-23 ENCOUNTER — Inpatient Hospital Stay (HOSPITAL_COMMUNITY): Payer: Medicaid Other

## 2015-06-23 DIAGNOSIS — O4693 Antepartum hemorrhage, unspecified, third trimester: Secondary | ICD-10-CM | POA: Diagnosis not present

## 2015-06-23 DIAGNOSIS — Z3A36 36 weeks gestation of pregnancy: Secondary | ICD-10-CM | POA: Diagnosis not present

## 2015-06-23 DIAGNOSIS — O368390 Maternal care for abnormalities of the fetal heart rate or rhythm, unspecified trimester, not applicable or unspecified: Secondary | ICD-10-CM

## 2015-06-23 DIAGNOSIS — R58 Hemorrhage, not elsewhere classified: Secondary | ICD-10-CM

## 2015-06-23 LAB — URINE MICROSCOPIC-ADD ON

## 2015-06-23 LAB — URINALYSIS, ROUTINE W REFLEX MICROSCOPIC
Bilirubin Urine: NEGATIVE
Glucose, UA: NEGATIVE mg/dL
Ketones, ur: NEGATIVE mg/dL
Nitrite: NEGATIVE
Protein, ur: NEGATIVE mg/dL
Specific Gravity, Urine: 1.01 (ref 1.005–1.030)
pH: 6 (ref 5.0–8.0)

## 2015-06-23 LAB — FIBRINOGEN: Fibrinogen: 517 mg/dL — ABNORMAL HIGH (ref 204–475)

## 2015-06-23 LAB — CBC
HCT: 31.4 % — ABNORMAL LOW (ref 36.0–46.0)
Hemoglobin: 10.4 g/dL — ABNORMAL LOW (ref 12.0–15.0)
MCH: 27.3 pg (ref 26.0–34.0)
MCHC: 33.1 g/dL (ref 30.0–36.0)
MCV: 82.4 fL (ref 78.0–100.0)
Platelets: 213 10*3/uL (ref 150–400)
RBC: 3.81 MIL/uL — ABNORMAL LOW (ref 3.87–5.11)
RDW: 15.4 % (ref 11.5–15.5)
WBC: 7.4 10*3/uL (ref 4.0–10.5)

## 2015-06-23 NOTE — MAU Provider Note (Signed)
History     CSN: [id number]  Arrival date and time: 06/23/15 1729   First Provider Initiated Contact with Patient 06/23/15 1818      Chief Complaint  Patient presents with  . Vaginal Bleeding  . Decreased Fetal Movement   HPI Carol Welch 40 y.o. [id number] @ [redacted]w[redacted]d complicated by AMA presents to MAU stating that she had a episode of bright red bleeding this afternoon. She denies having sex or any problems during her pregnancy. She states she felt like the baby has been asleep and not been moving since this afternoon.She states her abdomen is hurting and she feels like she is having contractions.  Past Medical History  Diagnosis Date  . Colitis   . Hypothyroid     Past Surgical History  Procedure Laterality Date  . No past surgeries      Family History  Problem Relation Age of Onset  . Heart disease Father   . Cancer Maternal Aunt   . Cancer Maternal Grandmother   . Heart disease Paternal Grandfather     Social History  Substance Use Topics  . Smoking status: Never Smoker   . Smokeless tobacco: Never Used  . Alcohol Use: No    Allergies: No Known Allergies  Prescriptions prior to admission  Medication Sig Dispense Refill Last Dose  . acetaminophen (TYLENOL) 500 MG tablet Take 1,000 mg by mouth every 6 (six) hours as needed for mild pain.   06/23/2015 at Unknown time  . ferrous sulfate 325 (65 FE) MG tablet Take 325 mg by mouth daily with breakfast.   06/22/2015 at Unknown time  . levothyroxine (SYNTHROID, LEVOTHROID) 100 MCG tablet Take 100 mcg by mouth daily before breakfast.    06/23/2015 at Unknown time  . NIFEdipine (PROCARDIA) 10 MG capsule Take 10 mg by mouth 3 (three) times daily as needed (for contractions). Reported on 06/23/2015   Past Week at Unknown time  . Prenatal Vit-Fe Fumarate-FA (PRENATAL MULTIVITAMIN) TABS tablet Take 1 tablet by mouth at bedtime.   06/23/2015 at Unknown time  . acetaminophen-codeine (TYLENOL #3) 300-30 MG tablet Take 1 tablet by mouth  every 6 (six) hours as needed for moderate pain. (Patient not taking: Reported on 06/18/2015) 30 tablet 0 Not Taking at Unknown time    Review of Systems  Gastrointestinal: Positive for abdominal pain.  Genitourinary:       Vaginal bleeding Decreased Fetal Movement  All other systems reviewed and are negative.  Physical Exam   Blood pressure 116/65, pulse 75, temperature 98.4 F (36.9 C), temperature source Oral, resp. rate 16, height  (1.575 m), weight 91.264 kg (201 lb 3.2 oz), last menstrual period 10/22/2014.  Physical Exam  Nursing note and vitals reviewed. Constitutional: She is oriented to person, place, and time. She appears well-developed and well-nourished. No distress.  HENT:  Head: Normocephalic and atraumatic.  Cardiovascular: Normal rate.   Respiratory: Effort normal. No respiratory distress.  GI: Soft. Bowel sounds are normal.  Genitourinary: Vagina normal.  No blood in vagina on spec exam  Musculoskeletal: Normal range of motion.  Neurological: She is alert and oriented to person, place, and time.  Skin: Skin is warm and dry.  Psychiatric: She has a normal mood and affect. Her behavior is normal. Judgment and thought content normal.    MAU Course  Procedures  MDM FHR noted with variable decelerations. Continues to have good variability. Notified Dr. Mindi Slicker . Labs pending. BPP and Ultrasound ordered. Turned over care to Carol Welch CNM  Assessment and Plan  Vaginal Bleeding in third trimester  Carol Welch,Carol Welch 06/23/2015, 7:01 PM   Assumed care while patient was in ultrasound department  Upon return, FHR reactive with average variability and good accels Irregular mild contractions  BPP was 8/8 AFI was 9.8 (21%ile)  Dr Mindi Slicker informed of above FHR tracing reviewed again with description of reactivity coupled with several variable decels.  She recommend letting patient go home Reiterated importance of monitoring fetal movement If movement  decreases again, patient is to come back then If all goes well, she will come to office on Monday for BPP and AFI  Carol Welch, CNM

## 2015-06-23 NOTE — MAU Note (Signed)
Had vaginal bleeding at noon today soaked pantyliner, decreased fetal movement, last time felt around 1 or 2, less bleeding, having contractions since last night, 2 cm yesterday.

## 2015-06-23 NOTE — Discharge Instructions (Signed)
Fetal Movement Counts  Patient Name: __________________________________________________ Patient Due Date: ____________________  Performing a fetal movement count is highly recommended in high-risk pregnancies, but it is good for every pregnant woman to do. Your health care provider may ask you to start counting fetal movements at 28 weeks of the pregnancy. Fetal movements often increase:  · After eating a full meal.  · After physical activity.  · After eating or drinking something sweet or cold.  · At rest.  Pay attention to when you feel the baby is most active. This will help you notice a pattern of your baby's sleep and wake cycles and what factors contribute to an increase in fetal movement. It is important to perform a fetal movement count at the same time each day when your baby is normally most active.   HOW TO COUNT FETAL MOVEMENTS  1. Find a quiet and comfortable area to sit or lie down on your left side. Lying on your left side provides the best blood and oxygen circulation to your baby.  2. Write down the day and time on a sheet of paper or in a journal.  3. Start counting kicks, flutters, swishes, rolls, or jabs in a 2-hour period. You should feel at least 10 movements within 2 hours.  4. If you do not feel 10 movements in 2 hours, wait 2-3 hours and count again. Look for a change in the pattern or not enough counts in 2 hours.  SEEK MEDICAL CARE IF:  · You feel less than 10 counts in 2 hours, tried twice.  · There is no movement in over an hour.  · The pattern is changing or taking longer each day to reach 10 counts in 2 hours.  · You feel the baby is not moving as he or she usually does.  Date: ____________ Movements: ____________ Start time: ____________ Finish time: ____________   Date: ____________ Movements: ____________ Start time: ____________ Finish time: ____________  Date: ____________ Movements: ____________ Start time: ____________ Finish time: ____________  Date: ____________ Movements:  ____________ Start time: ____________ Finish time: ____________  Date: ____________ Movements: ____________ Start time: ____________ Finish time: ____________  Date: ____________ Movements: ____________ Start time: ____________ Finish time: ____________  Date: ____________ Movements: ____________ Start time: ____________ Finish time: ____________  Date: ____________ Movements: ____________ Start time: ____________ Finish time: ____________   Date: ____________ Movements: ____________ Start time: ____________ Finish time: ____________  Date: ____________ Movements: ____________ Start time: ____________ Finish time: ____________  Date: ____________ Movements: ____________ Start time: ____________ Finish time: ____________  Date: ____________ Movements: ____________ Start time: ____________ Finish time: ____________  Date: ____________ Movements: ____________ Start time: ____________ Finish time: ____________  Date: ____________ Movements: ____________ Start time: ____________ Finish time: ____________  Date: ____________ Movements: ____________ Start time: ____________ Finish time: ____________   Date: ____________ Movements: ____________ Start time: ____________ Finish time: ____________  Date: ____________ Movements: ____________ Start time: ____________ Finish time: ____________  Date: ____________ Movements: ____________ Start time: ____________ Finish time: ____________  Date: ____________ Movements: ____________ Start time: ____________ Finish time: ____________  Date: ____________ Movements: ____________ Start time: ____________ Finish time: ____________  Date: ____________ Movements: ____________ Start time: ____________ Finish time: ____________  Date: ____________ Movements: ____________ Start time: ____________ Finish time: ____________   Date: ____________ Movements: ____________ Start time: ____________ Finish time: ____________  Date: ____________ Movements: ____________ Start time: ____________ Finish  time: ____________  Date: ____________ Movements: ____________ Start time: ____________ Finish time: ____________  Date: ____________ Movements: ____________ Start time:   ____________ Finish time: ____________  Date: ____________ Movements: ____________ Start time: ____________ Finish time: ____________  Date: ____________ Movements: ____________ Start time: ____________ Finish time: ____________  Date: ____________ Movements: ____________ Start time: ____________ Finish time: ____________   Date: ____________ Movements: ____________ Start time: ____________ Finish time: ____________  Date: ____________ Movements: ____________ Start time: ____________ Finish time: ____________  Date: ____________ Movements: ____________ Start time: ____________ Finish time: ____________  Date: ____________ Movements: ____________ Start time: ____________ Finish time: ____________  Date: ____________ Movements: ____________ Start time: ____________ Finish time: ____________  Date: ____________ Movements: ____________ Start time: ____________ Finish time: ____________  Date: ____________ Movements: ____________ Start time: ____________ Finish time: ____________   Date: ____________ Movements: ____________ Start time: ____________ Finish time: ____________  Date: ____________ Movements: ____________ Start time: ____________ Finish time: ____________  Date: ____________ Movements: ____________ Start time: ____________ Finish time: ____________  Date: ____________ Movements: ____________ Start time: ____________ Finish time: ____________  Date: ____________ Movements: ____________ Start time: ____________ Finish time: ____________  Date: ____________ Movements: ____________ Start time: ____________ Finish time: ____________  Date: ____________ Movements: ____________ Start time: ____________ Finish time: ____________   Date: ____________ Movements: ____________ Start time: ____________ Finish time: ____________  Date: ____________  Movements: ____________ Start time: ____________ Finish time: ____________  Date: ____________ Movements: ____________ Start time: ____________ Finish time: ____________  Date: ____________ Movements: ____________ Start time: ____________ Finish time: ____________  Date: ____________ Movements: ____________ Start time: ____________ Finish time: ____________  Date: ____________ Movements: ____________ Start time: ____________ Finish time: ____________  Date: ____________ Movements: ____________ Start time: ____________ Finish time: ____________   Date: ____________ Movements: ____________ Start time: ____________ Finish time: ____________  Date: ____________ Movements: ____________ Start time: ____________ Finish time: ____________  Date: ____________ Movements: ____________ Start time: ____________ Finish time: ____________  Date: ____________ Movements: ____________ Start time: ____________ Finish time: ____________  Date: ____________ Movements: ____________ Start time: ____________ Finish time: ____________  Date: ____________ Movements: ____________ Start time: ____________ Finish time: ____________     This information is not intended to replace advice given to you by your health care provider. Make sure you discuss any questions you have with your health care provider.     Document Released: 05/22/2006 Document Revised: 05/13/2014 Document Reviewed: 02/17/2012  Elsevier Interactive Patient Education ©2016 [employer] Inc[employer]

## 2015-06-23 NOTE — MAU Note (Signed)
Urine sent to lab 

## 2015-06-30 ENCOUNTER — Inpatient Hospital Stay (HOSPITAL_COMMUNITY): Payer: Medicaid Other | Admitting: Anesthesiology

## 2015-06-30 ENCOUNTER — Inpatient Hospital Stay (HOSPITAL_COMMUNITY)
Admission: AD | Admit: 2015-06-30 | Discharge: 2015-07-01 | DRG: 775 | Disposition: A | Discharge status: Home / Self Care | Payer: Medicaid Other | Source: Ambulatory Visit | Attending: Obstetrics and Gynecology | Admitting: Obstetrics and Gynecology

## 2015-06-30 ENCOUNTER — Encounter (HOSPITAL_COMMUNITY): Payer: Self-pay

## 2015-06-30 DIAGNOSIS — K219 Gastro-esophageal reflux disease without esophagitis: Secondary | ICD-10-CM | POA: Diagnosis present

## 2015-06-30 DIAGNOSIS — O9962 Diseases of the digestive system complicating childbirth: Secondary | ICD-10-CM | POA: Diagnosis present

## 2015-06-30 DIAGNOSIS — E039 Hypothyroidism, unspecified: Secondary | ICD-10-CM | POA: Diagnosis present

## 2015-06-30 DIAGNOSIS — E669 Obesity, unspecified: Secondary | ICD-10-CM | POA: Diagnosis present

## 2015-06-30 DIAGNOSIS — O99214 Obesity complicating childbirth: Secondary | ICD-10-CM | POA: Diagnosis present

## 2015-06-30 DIAGNOSIS — Z3A37 37 weeks gestation of pregnancy: Secondary | ICD-10-CM | POA: Diagnosis not present

## 2015-06-30 DIAGNOSIS — Z6836 Body mass index (BMI) 36.0-36.9, adult: Secondary | ICD-10-CM | POA: Diagnosis not present

## 2015-06-30 DIAGNOSIS — O99824 Streptococcus B carrier state complicating childbirth: Secondary | ICD-10-CM | POA: Diagnosis present

## 2015-06-30 DIAGNOSIS — Z8249 Family history of ischemic heart disease and other diseases of the circulatory system: Secondary | ICD-10-CM

## 2015-06-30 DIAGNOSIS — O99284 Endocrine, nutritional and metabolic diseases complicating childbirth: Secondary | ICD-10-CM | POA: Diagnosis present

## 2015-06-30 LAB — TYPE AND SCREEN
ABO/RH(D): A POS
ANTIBODY SCREEN: NEGATIVE

## 2015-06-30 LAB — CBC
HCT: 32.6 % — ABNORMAL LOW (ref 36.0–46.0)
Hemoglobin: 10.9 g/dL — ABNORMAL LOW (ref 12.0–15.0)
MCH: 27.1 pg (ref 26.0–34.0)
MCHC: 33.4 g/dL (ref 30.0–36.0)
MCV: 81.1 fL (ref 78.0–100.0)
PLATELETS: 240 10*3/uL (ref 150–400)
RBC: 4.02 MIL/uL (ref 3.87–5.11)
RDW: 15.3 % (ref 11.5–15.5)
WBC: 7.7 10*3/uL (ref 4.0–10.5)

## 2015-06-30 MED ORDER — TERBUTALINE SULFATE 1 MG/ML IJ SOLN: 0.2500 mg | Freq: Once | INTRAMUSCULAR | Status: DC | PRN

## 2015-06-30 MED ORDER — OXYCODONE-ACETAMINOPHEN 5-325 MG PO TABS: 2.0000 | ORAL_TABLET | ORAL | Status: DC | PRN

## 2015-06-30 MED ORDER — DIPHENHYDRAMINE HCL 50 MG/ML IJ SOLN: 12.5000 mg | INTRAMUSCULAR | Status: DC | PRN

## 2015-06-30 MED ORDER — LIDOCAINE HCL (PF) 1 % IJ SOLN: 30.0000 mL | INTRAMUSCULAR | Status: DC | PRN

## 2015-06-30 MED ORDER — FENTANYL 2.5 MCG/ML BUPIVACAINE 1/10 % EPIDURAL INFUSION (WH - ANES)
INTRAMUSCULAR | Status: DC | PRN
  Administered 2015-06-30: 13 mL/h via EPIDURAL

## 2015-06-30 MED ORDER — OXYTOCIN BOLUS FROM INFUSION
500.0000 mL | Freq: Once | INTRAVENOUS | Status: AC | PRN
  Administered 2015-06-30: 500 mL via INTRAVENOUS

## 2015-06-30 MED ORDER — ACETAMINOPHEN 325 MG PO TABS
650.0000 mg | ORAL_TABLET | ORAL | Status: DC | PRN
  Administered 2015-07-01: 650 mg via ORAL
  Filled 2015-06-30 (×2): qty 2

## 2015-06-30 MED ORDER — IBUPROFEN 600 MG PO TABS
600.0000 mg | ORAL_TABLET | Freq: Four times a day (QID) | ORAL | Status: DC
  Administered 2015-07-01 (×3): 600 mg via ORAL
  Filled 2015-06-30 (×3): qty 1

## 2015-06-30 MED ORDER — LANOLIN HYDROUS EX OINT
TOPICAL_OINTMENT | CUTANEOUS | Status: DC | PRN
Start: 2015-06-30 — End: 2015-07-01

## 2015-06-30 MED ORDER — BUTORPHANOL TARTRATE 1 MG/ML IJ SOLN
1.0000 mg | INTRAMUSCULAR | Status: DC | PRN
  Administered 2015-06-30: 1 mg via INTRAVENOUS
  Filled 2015-06-30: qty 1

## 2015-06-30 MED ORDER — SIMETHICONE 80 MG PO CHEW: 80.0000 mg | CHEWABLE_TABLET | ORAL | Status: DC | PRN

## 2015-06-30 MED ORDER — OXYTOCIN 10 UNIT/ML IJ SOLN: 2.5000 [IU]/h | Freq: Once | INTRAVENOUS | Status: DC | PRN

## 2015-06-30 MED ORDER — TETANUS-DIPHTH-ACELL PERTUSSIS 5-2.5-18.5 LF-MCG/0.5 IM SUSP
0.5000 mL | Freq: Once | INTRAMUSCULAR | Status: DC
  Filled 2015-06-30: qty 0.5

## 2015-06-30 MED ORDER — ONDANSETRON HCL 4 MG/2ML IJ SOLN: 4.0000 mg | INTRAMUSCULAR | Status: DC | PRN

## 2015-06-30 MED ORDER — DIBUCAINE 1 % RE OINT
1.0000 | TOPICAL_OINTMENT | RECTAL | Status: DC | PRN
Start: 2015-06-30 — End: 2015-07-01
  Filled 2015-06-30: qty 28

## 2015-06-30 MED ORDER — PENICILLIN G POTASSIUM 5000000 UNITS IJ SOLR
2.5000 10*6.[IU] | INTRAVENOUS | Status: DC
  Administered 2015-06-30: 2.5 10*6.[IU] via INTRAVENOUS
  Filled 2015-06-30 (×3): qty 2.5

## 2015-06-30 MED ORDER — LACTATED RINGERS IV SOLN
500.0000 mL | INTRAVENOUS | Status: DC | PRN
Start: 2015-06-30 — End: 2015-06-30
  Administered 2015-06-30: 500 mL via INTRAVENOUS

## 2015-06-30 MED ORDER — ZOLPIDEM TARTRATE 5 MG PO TABS: 5.0000 mg | ORAL_TABLET | Freq: Every evening | ORAL | Status: DC | PRN

## 2015-06-30 MED ORDER — ONDANSETRON HCL 4 MG/2ML IJ SOLN: 4.0000 mg | Freq: Four times a day (QID) | INTRAMUSCULAR | Status: DC | PRN

## 2015-06-30 MED ORDER — LACTATED RINGERS IV SOLN: 500.0000 mL | Freq: Once | INTRAVENOUS | Status: DC

## 2015-06-30 MED ORDER — FENTANYL 2.5 MCG/ML BUPIVACAINE 1/10 % EPIDURAL INFUSION (WH - ANES): 14.0000 mL/h | INTRAMUSCULAR | Status: DC | PRN

## 2015-06-30 MED ORDER — OXYCODONE-ACETAMINOPHEN 5-325 MG PO TABS: 1.0000 | ORAL_TABLET | ORAL | Status: DC | PRN

## 2015-06-30 MED ORDER — BENZOCAINE-MENTHOL 20-0.5 % EX AERO
1.0000 "application " | INHALATION_SPRAY | CUTANEOUS | Status: DC | PRN
  Administered 2015-07-01: 1 via TOPICAL
  Filled 2015-06-30 (×2): qty 56

## 2015-06-30 MED ORDER — WITCH HAZEL-GLYCERIN EX PADS: 1.0000 "application " | MEDICATED_PAD | CUTANEOUS | Status: DC | PRN

## 2015-06-30 MED ORDER — EPHEDRINE 5 MG/ML INJ
10.0000 mg | INTRAVENOUS | Status: DC | PRN
  Filled 2015-06-30: qty 2

## 2015-06-30 MED ORDER — CITRIC ACID-SODIUM CITRATE 334-500 MG/5ML PO SOLN: 30.0000 mL | ORAL | Status: DC | PRN

## 2015-06-30 MED ORDER — PHENYLEPHRINE 40 MCG/ML (10ML) SYRINGE FOR IV PUSH (FOR BLOOD PRESSURE SUPPORT)
80.0000 ug | PREFILLED_SYRINGE | INTRAVENOUS | Status: DC | PRN
  Filled 2015-06-30: qty 2

## 2015-06-30 MED ORDER — LIDOCAINE HCL (PF) 1 % IJ SOLN
INTRAMUSCULAR | Status: DC | PRN
  Administered 2015-06-30 (×2): 4 mL via EPIDURAL

## 2015-06-30 MED ORDER — OXYTOCIN 10 UNIT/ML IJ SOLN
1.0000 m[IU]/min | INTRAVENOUS | Status: DC
  Administered 2015-06-30: 2 m[IU]/min via INTRAVENOUS
  Filled 2015-06-30: qty 4

## 2015-06-30 MED ORDER — PHENYLEPHRINE 40 MCG/ML (10ML) SYRINGE FOR IV PUSH (FOR BLOOD PRESSURE SUPPORT)
PREFILLED_SYRINGE | INTRAVENOUS | Status: DC
Start: 2015-06-30 — End: 2015-07-01
  Filled 2015-06-30: qty 20

## 2015-06-30 MED ORDER — DIPHENHYDRAMINE HCL 25 MG PO CAPS: 25.0000 mg | ORAL_CAPSULE | Freq: Four times a day (QID) | ORAL | Status: DC | PRN

## 2015-06-30 MED ORDER — ONDANSETRON HCL 4 MG PO TABS: 4.0000 mg | ORAL_TABLET | ORAL | Status: DC | PRN

## 2015-06-30 MED ORDER — PRENATAL MULTIVITAMIN CH
1.0000 | ORAL_TABLET | Freq: Every day | ORAL | Status: DC
  Administered 2015-07-01: 1 via ORAL
  Filled 2015-06-30: qty 1

## 2015-06-30 MED ORDER — LACTATED RINGERS IV SOLN
INTRAVENOUS | Status: DC
  Administered 2015-06-30 (×2): via INTRAVENOUS

## 2015-06-30 MED ORDER — LEVOTHYROXINE SODIUM 100 MCG PO TABS
100.0000 ug | ORAL_TABLET | Freq: Every day | ORAL | Status: DC
  Administered 2015-07-01: 100 ug via ORAL
  Filled 2015-06-30 (×2): qty 1

## 2015-06-30 MED ORDER — FENTANYL 2.5 MCG/ML BUPIVACAINE 1/10 % EPIDURAL INFUSION (WH - ANES)
INTRAMUSCULAR | Status: AC
  Administered 2015-06-30: 19:00:00
  Filled 2015-06-30: qty 125

## 2015-06-30 MED ORDER — ACETAMINOPHEN 325 MG PO TABS: 650.0000 mg | ORAL_TABLET | ORAL | Status: DC | PRN

## 2015-06-30 MED ORDER — PENICILLIN G POTASSIUM 5000000 UNITS IJ SOLR
5.0000 10*6.[IU] | Freq: Once | INTRAVENOUS | Status: AC
  Administered 2015-06-30: 5 10*6.[IU] via INTRAVENOUS
  Filled 2015-06-30: qty 5

## 2015-06-30 MED ORDER — SENNOSIDES-DOCUSATE SODIUM 8.6-50 MG PO TABS: 2.0000 | ORAL_TABLET | ORAL | Status: DC

## 2015-06-30 MED ORDER — LACTATED RINGERS IV SOLN: INTRAVENOUS | Status: DC

## 2015-06-30 NOTE — Anesthesia Preprocedure Evaluation (Addendum)
Anesthesia Evaluation  Patient identified by MRN, date of birth, ID band Patient awake    Reviewed: Allergy & Precautions, NPO status , Patient's Chart, lab work & pertinent test results  Airway Mallampati: III  TM Distance: >3 FB Neck ROM: Full    Dental no notable dental hx.    Pulmonary neg pulmonary ROS,    Pulmonary exam normal breath sounds clear to auscultation       Cardiovascular negative cardio ROS Normal cardiovascular exam Rhythm:Regular Rate:Normal     Neuro/Psych negative neurological ROS  negative psych ROS   GI/Hepatic Neg liver ROS, GERD  Medicated and Controlled,  Endo/Other  Hypothyroidism Obesity  Renal/GU negative Renal ROS     Musculoskeletal negative musculoskeletal ROS (+)   Abdominal (+) + obese,   Peds  Hematology  (+) anemia ,   Anesthesia Other Findings   Reproductive/Obstetrics (+) Pregnancy AMA 37 1/7 weeks Hx/o PTL                            Anesthesia Physical Anesthesia Plan  ASA: III  Anesthesia Plan: Epidural   Post-op Pain Management:    Induction:   Airway Management Planned: Natural Airway  Additional Equipment:   Intra-op Plan:   Post-operative Plan:   Informed Consent: I have reviewed the patients History and Physical, chart, labs and discussed the procedure including the risks, benefits and alternatives for the proposed anesthesia with the patient or authorized representative who has indicated his/her understanding and acceptance.     Plan Discussed with: Anesthesiologist  Anesthesia Plan Comments:         Anesthesia Quick Evaluation

## 2015-06-30 NOTE — Progress Notes (Signed)
Patient ID: Carol Welch, female   DOB: 04-12-1976, 40 y.o.   MRN: [id number]  Comfortable with epidural  AFVSS gen NAD FHTs 140, mod var, category 1 - had variable decels with ctx, pit off toco q  SVE 7.5/100/0  AROM for clear fluid, no diff/comp  Expect SVD Continue augmentation

## 2015-06-30 NOTE — Anesthesia Procedure Notes (Signed)
Epidural Patient location during procedure: OB Start time: 06/30/2015 7:21 PM  Staffing Anesthesiologist: Mal Amabile Performed by: anesthesiologist   Preanesthetic Checklist Completed: patient identified, site marked, surgical consent, pre-op evaluation, timeout performed, IV checked, risks and benefits discussed and monitors and equipment checked  Epidural Patient position: sitting Prep: site prepped and draped and DuraPrep Patient monitoring: continuous pulse ox and blood pressure Approach: midline Location: L3-L4 Injection technique: LOR air  Needle:  Needle type: Tuohy  Needle gauge: 17 G Needle length: 9 cm and 9 Needle insertion depth: 5 cm cm Catheter type: closed end flexible Catheter size: 19 Gauge Catheter at skin depth: 10 cm Test dose: negative and Other  Assessment Events: blood not aspirated, injection not painful, no injection resistance, negative IV test and no paresthesia  Additional Notes Patient identified. Risks and benefits discussed including failed block, incomplete  Pain control, post dural puncture headache, nerve damage, paralysis, blood pressure Changes, nausea, vomiting, reactions to medications-both toxic and allergic and post Partum back pain. All questions were answered. Patient expressed understanding and wished to proceed. Sterile technique was used throughout procedure. Epidural site was Dressed with sterile barrier dressing. No paresthesias, signs of intravascular injection Or signs of intrathecal spread were encountered.  Patient was more comfortable after the epidural was dosed. Please see RN's note for documentation of vital signs and FHR which are stable.

## 2015-06-30 NOTE — H&P (Signed)
Carol Welch is a 40 y.o. female [phone] at 37+ with cervical change in the office.  Pt with bloody show and uncomfortable.  PNC complicated by discomfort throughout pregnancy.  H/O cervical dysplasia and LEEP, abn pap with pregnancy.  GBBS positive.  Also h/o obesity and thyroid disease.   Maternal Medical History:  Reason for admission: Contractions and vaginal bleeding.   Contractions: Frequency: regular.   Perceived severity is strong.    Fetal activity: Perceived fetal activity is normal.    Prenatal Complications - Diabetes: none.    OB History    Gravida Para Term Preterm AB TAB SAB Ectopic Multiple Living   SVD x 3, SAB, G5 present H/o LEEP, last pap LGSIL HR HPV H/o GC/Chl/PID  Past Medical History  Diagnosis Date  . Colitis   . Hypothyroid    Past Surgical History  Procedure Laterality Date  . No past surgeries     Family History: family history includes Cancer in her maternal aunt and maternal grandmother; Heart disease in her father and paternal grandfather. Social History:  reports that she has never smoked. She has never used smokeless tobacco. She reports that she does not drink alcohol or use illicit drugs. separated Meds PNV, levothyroxine All NKDA   Prenatal Transfer Tool  Maternal Diabetes: No Genetic Screening: Normal Maternal Ultrasounds/Referrals: Normal Fetal Ultrasounds or other Referrals:  None Maternal Substance Abuse:  No Significant Maternal Medications:  None Significant Maternal Lab Results:  Lab values include: Group B Strep positive Other Comments:  None  Review of Systems  Constitutional: Negative.   HENT: Negative.   Eyes: Negative.   Respiratory: Negative.   Cardiovascular: Negative.   Gastrointestinal: Negative.   Genitourinary: Negative.   Skin: Negative.   Neurological: Negative.   Psychiatric/Behavioral: Negative.       Blood pressure 111/84, pulse 102, temperature 97.9 F (36.6 C), resp. rate 18,  last menstrual period 10/22/2014. Maternal Exam:  Uterine Assessment: Contraction strength is moderate.  Contraction frequency is regular.   Abdomen: Fundal height is appropriate for gestation.   Estimated fetal weight is 7#.   Fetal presentation: vertex  Introitus: Normal vulva. Normal vagina.  Cervix: Cervix evaluated by digital exam.     Physical Exam  Constitutional: She is oriented to person, place, and time. She appears well-developed and well-nourished.  HENT:  Head: Normocephalic and atraumatic.  Cardiovascular: Normal rate and regular rhythm.   Respiratory: Effort normal and breath sounds normal. No respiratory distress. She has no wheezes.  GI: Soft. Bowel sounds are normal. She exhibits no distension. There is no tenderness.  Musculoskeletal: Normal range of motion.  Neurological: She is alert and oriented to person, place, and time.  Skin: Skin is warm and dry.  Psychiatric: She has a normal mood and affect. Her behavior is normal.    Prenatal labs: ABO, Rh: --/--/A POS, A POS (11/12 2320) Antibody: NEG (11/12 2320) Rubella:  Immune RPR: Non Reactive (07/18 2138)  HBsAg:   neg HIV: Non Reactive (07/18 2138)  GBS: Positive (02/08 0000)  Hgb 12.5/Plt 229/UrCx neg/GC neg/ Chl neg/ Varicella nonimmune/AFP WNL/glucola 134  Tdap 04/2015 [place] nl anat, post plac female  Assessment/Plan: 39yo [id number] at  37+ in early labor PCN for GBBS Pitocin and AROM (after PCN) Expect SVD    Bovard-Stuckert, Nickolas Chalfin 06/30/2015, 2:11 PM

## 2015-07-01 ENCOUNTER — Encounter (HOSPITAL_COMMUNITY): Payer: Self-pay | Admitting: General Practice

## 2015-07-01 LAB — CBC
HCT: 30.2 % — ABNORMAL LOW (ref 36.0–46.0)
HEMOGLOBIN: 10.1 g/dL — AB (ref 12.0–15.0)
MCH: 27.2 pg (ref 26.0–34.0)
MCHC: 33.4 g/dL (ref 30.0–36.0)
MCV: 81.4 fL (ref 78.0–100.0)
PLATELETS: 209 10*3/uL (ref 150–400)
RBC: 3.71 MIL/uL — AB (ref 3.87–5.11)
RDW: 15.5 % (ref 11.5–15.5)
WBC: 9 10*3/uL (ref 4.0–10.5)

## 2015-07-01 LAB — RPR: RPR Ser Ql: NONREACTIVE

## 2015-07-01 MED ORDER — PRENATAL MULTIVITAMIN CH: 1.0000 | ORAL_TABLET | Freq: Every day | ORAL | Status: DC

## 2015-07-01 MED ORDER — IBUPROFEN 800 MG PO TABS: 800.0000 mg | ORAL_TABLET | Freq: Four times a day (QID) | ORAL | Status: DC

## 2015-07-01 NOTE — Anesthesia Postprocedure Evaluation (Signed)
Anesthesia Post Note  Patient: Carol Welch  Procedure(s) Performed: * No procedures listed *  Patient location during evaluation: Mother Baby Anesthesia Type: Epidural Level of consciousness: awake and alert and oriented Pain management: satisfactory to patient Vital Signs Assessment: post-procedure vital signs reviewed and stable Respiratory status: spontaneous breathing and nonlabored ventilation Cardiovascular status: stable Postop Assessment: no headache, no backache, no signs of nausea or vomiting, adequate PO intake and patient able to bend at knees (patient up walking) Anesthetic complications: no    Last Vitals:  Filed Vitals:   06/30/15 2354 07/01/15 0359  BP: 114/68 97/56  Pulse: 71 62  Temp: 36.5 C 36.9 C  Resp: 18 18    Last Pain:  Filed Vitals:   07/01/15 0546  PainSc: 5                  Daneya Hartgrove

## 2015-07-01 NOTE — Discharge Summary (Signed)
OB Discharge Summary     Patient Name: Carol Welch DOB: 1975-08-27 MRN: [id number]  Date of admission: 06/30/2015 Delivering MD: Sherian Rein   Date of discharge: 07/01/2015  Admitting diagnosis: LABOR Intrauterine pregnancy: [redacted]w[redacted]d     Secondary diagnosis:  Principal Problem:   SVD (spontaneous vaginal delivery) Active Problems:   Labor and delivery, indication for care  Additional problems: N/A     Discharge diagnosis: Term Pregnancy Delivered                                                                                                Post partum procedures:N/A  Augmentation: AROM and Pitocin  Complications: None  Hospital course:  Onset of Labor With Vaginal Delivery     40 y.o. yo [id number] at [redacted]w[redacted]d was admitted in Latent Labor on 06/30/2015. Patient had an uncomplicated labor course as follows:  Membrane Rupture Time/Date: 8:30 PM ,06/30/2015   Intrapartum Procedures: Episiotomy: None [1]                                         Lacerations:  Labial [10]  Patient had a delivery of a Viable infant. 06/30/2015  Information for the patient's newborn:  Myana, Schl[place] [[id number]]  Delivery Method: Vag-Spont    Pateint had an uncomplicated postpartum course.  She is ambulating, tolerating a regular diet, passing flatus, and urinating well. Patient is discharged home in stable condition on 07/01/2015.    Physical exam  Filed Vitals:   06/30/15 2249 06/30/15 2354 07/01/15 0359 07/01/15 0900  BP: 115/60 114/68 97/56 94/55   Pulse: 67 71 62 67  Temp: 97.9 F (36.6 C) 97.7 F (36.5 C) 98.5 F (36.9 C) 98.1 F (36.7 C)  TempSrc: Oral Oral Oral Oral  Resp: Height:      Weight:      SpO2:    99%   General: alert and no distress Lochia: appropriate Uterine Fundus: firm  Labs: Lab Results  Component Value Date   WBC 9.0 07/01/2015   HGB 10.1* 07/01/2015   HCT 30.2* 07/01/2015   MCV 81.4 07/01/2015   PLT 209 07/01/2015   CMP Latest Ref  Rng 03/19/2015  Glucose 65 - 99 mg/dL 88  BUN 6 - 20 mg/dL 9  Creatinine 1.30 - 8.65 mg/dL 7.84  Sodium 696 - 295 mmol/L 136  Potassium 3.5 - 5.1 mmol/L 3.5  Chloride 101 - 111 mmol/L 108  CO2 22 - 32 mmol/L 22  Calcium 8.9 - 10.3 mg/dL 7.7(L)  Total Protein 6.5 - 8.1 g/dL 2.8(U)  Total Bilirubin 0.3 - 1.2 mg/dL 0.3  Alkaline Phos 38 - 126 U/L 39  AST 15 - 41 U/L 17  ALT 14 - 54 U/L 10(L)    Discharge instruction: per After Visit Summary and "Baby and Me Booklet".  After visit meds:    Medication List    STOP taking these medications        acetaminophen-codeine 300-30 MG tablet  Commonly  known as:  TYLENOL #3     NIFEdipine 10 MG capsule  Commonly known as:  PROCARDIA      TAKE these medications        acetaminophen 500 MG tablet  Commonly known as:  TYLENOL  Take 1,000 mg by mouth every 6 (six) hours as needed for mild pain.     ferrous sulfate 325 (65 FE) MG tablet  Take 325 mg by mouth daily with breakfast.     ibuprofen 800 MG tablet  Commonly known as:  ADVIL,MOTRIN  Take 1 tablet (800 mg total) by mouth every 6 (six) hours.     levothyroxine 100 MCG tablet  Commonly known as:  SYNTHROID, LEVOTHROID  Take 100 mcg by mouth daily before breakfast.     prenatal multivitamin Tabs tablet  Take 1 tablet by mouth at bedtime.        Diet: routine diet  Activity: Advance as tolerated. Pelvic rest for 6 weeks.   Outpatient follow up:6 weeks Follow up Appt:No future appointments. Follow up Visit:No Follow-up on file.  Postpartum contraception: Undecided  Newborn Data: Live born female  Birth Weight: 5 lb 13.7 oz (2655 g) APGAR: 9, 9  Baby Feeding: Breast Disposition:transfer to [facility] - imperforate anus   07/01/2015 Sherian Rein, MD

## 2015-07-01 NOTE — Progress Notes (Addendum)
Post Partum Day 1 Subjective: no complaints, up ad lib, voiding, tolerating PO and nl lochia, pain controlled  Objective: Blood pressure 97/56, pulse 62, temperature 98.5 F (36.9 C), temperature source Oral, resp. rate 18, height  (1.575 m), weight 89.359 kg (197 lb), last menstrual period 10/22/2014, SpO2 98 %, unknown if currently breastfeeding.  Physical Exam:  General: alert and no distress Lochia: appropriate Uterine Fundus: firm   Recent Labs  06/30/15 1405 07/01/15 0531  HGB 10.9* 10.1*  HCT 32.6* 30.2*    Assessment/Plan: Plan for discharge tomorrow, Breastfeeding and Lactation consult.  Routine PP care   LOS: 1 day   Bovard-Stuckert, Lachlan Pelto 07/01/2015, 8:10 AM   Baby with imperforate anus - will transfer to [facility] per NICU, will d/c today with Motrin and PNV.  F/u 6 weeks

## 2015-07-11 ENCOUNTER — Inpatient Hospital Stay (HOSPITAL_COMMUNITY)
Admission: AD | Admit: 2015-07-11 | Discharge: 2015-07-11 | Disposition: A | Discharge status: Home / Self Care | Payer: Medicaid Other | Source: Ambulatory Visit | Attending: Obstetrics and Gynecology | Admitting: Obstetrics and Gynecology

## 2015-07-11 ENCOUNTER — Inpatient Hospital Stay (HOSPITAL_COMMUNITY): Payer: Medicaid Other

## 2015-07-11 ENCOUNTER — Encounter (HOSPITAL_COMMUNITY): Payer: Self-pay | Admitting: *Deleted

## 2015-07-11 ENCOUNTER — Encounter (HOSPITAL_COMMUNITY): Payer: Self-pay | Admitting: Emergency Medicine

## 2015-07-11 ENCOUNTER — Emergency Department (HOSPITAL_COMMUNITY)
Admission: EM | Admit: 2015-07-11 | Discharge: 2015-07-11 | Disposition: A | Discharge status: Home / Self Care | Payer: Medicaid Other | Source: Home / Self Care | Attending: Family Medicine | Admitting: Family Medicine

## 2015-07-11 DIAGNOSIS — O8622 Infection of bladder following delivery: Secondary | ICD-10-CM | POA: Diagnosis not present

## 2015-07-11 DIAGNOSIS — R102 Pelvic and perineal pain: Secondary | ICD-10-CM | POA: Diagnosis not present

## 2015-07-11 DIAGNOSIS — R109 Unspecified abdominal pain: Secondary | ICD-10-CM | POA: Insufficient documentation

## 2015-07-11 DIAGNOSIS — N3000 Acute cystitis without hematuria: Secondary | ICD-10-CM

## 2015-07-11 DIAGNOSIS — Z8742 Personal history of other diseases of the female genital tract: Secondary | ICD-10-CM | POA: Diagnosis not present

## 2015-07-11 DIAGNOSIS — R52 Pain, unspecified: Secondary | ICD-10-CM

## 2015-07-11 DIAGNOSIS — IMO0001 Reserved for inherently not codable concepts without codable children: Secondary | ICD-10-CM

## 2015-07-11 DIAGNOSIS — O9089 Other complications of the puerperium, not elsewhere classified: Secondary | ICD-10-CM

## 2015-07-11 LAB — URINE MICROSCOPIC-ADD ON

## 2015-07-11 LAB — COMPREHENSIVE METABOLIC PANEL
ALBUMIN: 3.3 g/dL — AB (ref 3.5–5.0)
ALK PHOS: 83 U/L (ref 38–126)
ALT: 27 U/L (ref 14–54)
AST: 23 U/L (ref 15–41)
Anion gap: 8 (ref 5–15)
BUN: 18 mg/dL (ref 6–20)
CALCIUM: 8.4 mg/dL — AB (ref 8.9–10.3)
CHLORIDE: 107 mmol/L (ref 101–111)
CO2: 24 mmol/L (ref 22–32)
CREATININE: 0.6 mg/dL (ref 0.44–1.00)
GFR calc Af Amer: >60 mL/min (ref >60)
GFR calc non Af Amer: >60 mL/min (ref >60)
GLUCOSE: 90 mg/dL (ref 65–99)
Potassium: 3.9 mmol/L (ref 3.5–5.1)
SODIUM: 139 mmol/L (ref 135–145)
Total Bilirubin: 0.4 mg/dL (ref 0.3–1.2)
Total Protein: 6.9 g/dL (ref 6.5–8.1)

## 2015-07-11 LAB — URINALYSIS, ROUTINE W REFLEX MICROSCOPIC
BILIRUBIN URINE: NEGATIVE
Glucose, UA: NEGATIVE mg/dL
Ketones, ur: NEGATIVE mg/dL
NITRITE: NEGATIVE
Protein, ur: NEGATIVE mg/dL
SPECIFIC GRAVITY, URINE: 1.025 (ref 1.005–1.030)
pH: 5.5 (ref 5.0–8.0)

## 2015-07-11 LAB — CBC
HCT: 35.3 % — ABNORMAL LOW (ref 36.0–46.0)
Hemoglobin: 11.9 g/dL — ABNORMAL LOW (ref 12.0–15.0)
MCH: 27.7 pg (ref 26.0–34.0)
MCHC: 33.7 g/dL (ref 30.0–36.0)
MCV: 82.1 fL (ref 78.0–100.0)
PLATELETS: 274 10*3/uL (ref 150–400)
RBC: 4.3 MIL/uL (ref 3.87–5.11)
RDW: 15.5 % (ref 11.5–15.5)
WBC: 7.4 10*3/uL (ref 4.0–10.5)

## 2015-07-11 LAB — WET PREP, GENITAL
Clue Cells Wet Prep HPF POC: NONE SEEN
Sperm: NONE SEEN
Trich, Wet Prep: NONE SEEN
YEAST WET PREP: NONE SEEN

## 2015-07-11 MED ORDER — KETOROLAC TROMETHAMINE 60 MG/2ML IM SOLN: 60.0000 mg | INTRAMUSCULAR | Status: DC

## 2015-07-11 MED ORDER — AMOXICILLIN-POT CLAVULANATE 875-125 MG PO TABS: 1.0000 | ORAL_TABLET | Freq: Two times a day (BID) | ORAL | Status: DC

## 2015-07-11 NOTE — ED Notes (Signed)
Pt here with right lower quad intermit abdominal pain that started yesterday Bleeding reported s/p new baby 2/25 Pt called OB, prescribed Ibuprofen States the pain is worsening with chills and nausea

## 2015-07-11 NOTE — Discharge Instructions (Signed)
Endometritis Endometritis is an irritation, soreness, and swelling (inflammation) of the lining of the uterus (endometrium).  CAUSES   Bacterial infections.  Sexually transmitted infections (STIs).  Having a miscarriage or childbirth, especially after a long labor or cesarean delivery.  Certain gynecological procedures (such as dilation and curettage, hysteroscopy, or contraceptive insertion). SIGNS AND SYMPTOMS   Fever.  Lower abdominal or pelvic pain.  Abnormal vaginal discharge or bleeding.  Abdominal bloating (distention) or swelling.  General discomfort or ill feeling.  Discomfort with bowel movements. DIAGNOSIS  A physical and pelvic exam are performed. Other tests may include:  Cultures from the cervix.  Blood tests.  Examining a tissue sample of the uterine lining (endometrial biopsy).  Examining discharge under a microscope (wet prep).  Laparoscopy. TREATMENT  Antibiotic medicines are usually given. Other treatments may include:  Fluids through an IV tube inserted in your vein.  Rest. HOME CARE INSTRUCTIONS   Take over-the-counter or prescription medicines for pain, discomfort, or fever as directed by your health care provider.  Take your antibiotics as directed. Finish them even if you start to feel better.  Resume your normal diet and activities as directed or as tolerated.  Do not douche or have sexual intercourse until your health care provider says it is okay.  Do not have sexual intercourse until your partner has been treated if your endometritis is caused by an STI. SEEK IMMEDIATE MEDICAL CARE IF:   You have swelling or increasing pain in the abdomen.  You have a fever.  You have bad smelling vaginal discharge, or you have an increased amount of discharge.  You have abnormal vaginal bleeding.  Your medicine is not helping with the pain.  You experience any problems that may be related to the medicine you are taking.  You have nausea  and vomiting, or you cannot keep foods down.  You have pain with bowel movements. MAKE SURE YOU:   Understand these instructions.  Will watch your condition.  Will get help right away if you are not doing well or get worse.   This information is not intended to replace advice given to you by your health care provider. Make sure you discuss any questions you have with your health care provider.   Document Released: 04/16/2001 Document Revised: 12/23/2012 Document Reviewed: 11/19/2012 Elsevier Interactive Patient Education 2016 [employer]  Urinary Tract Infection Urinary tract infections (UTIs) can develop anywhere along your urinary tract. Your urinary tract is your body's drainage system for removing wastes and extra water. Your urinary tract includes two kidneys, two ureters, a bladder, and a urethra. Your kidneys are a pair of bean-shaped organs. Each kidney is about the size of your fist. They are located below your ribs, one on each side of your spine. CAUSES Infections are caused by microbes, which are microscopic organisms, including fungi, viruses, and bacteria. These organisms are so small that they can only be seen through a microscope. Bacteria are the microbes that most commonly cause UTIs. SYMPTOMS  Symptoms of UTIs may vary by age and gender of the patient and by the location of the infection. Symptoms in [place] women typically include a frequent and intense urge to urinate and a painful, burning feeling in the bladder or urethra during urination. Older women and men are more likely to be tired, shaky, and weak and have muscle aches and abdominal pain. A fever may mean the infection is in your kidneys. Other symptoms of a kidney infection include pain in your back  or sides below the ribs, nausea, and vomiting. DIAGNOSIS To diagnose a UTI, your caregiver will ask you about your symptoms. Your caregiver will also ask you to provide a urine sample. The urine sample will be tested  for bacteria and white blood cells. White blood cells are made by your body to help fight infection. TREATMENT  Typically, UTIs can be treated with medication. Because most UTIs are caused by a bacterial infection, they usually can be treated with the use of antibiotics. The choice of antibiotic and length of treatment depend on your symptoms and the type of bacteria causing your infection. HOME CARE INSTRUCTIONS  If you were prescribed antibiotics, take them exactly as your caregiver instructs you. Finish the medication even if you feel better after you have only taken some of the medication.  Drink enough water and fluids to keep your urine clear or pale yellow.  Avoid caffeine, tea, and carbonated beverages. They tend to irritate your bladder.  Empty your bladder often. Avoid holding urine for long periods of time.  Empty your bladder before and after sexual intercourse.  After a bowel movement, women should cleanse from front to back. Use each tissue only once. SEEK MEDICAL CARE IF:   You have back pain.  You develop a fever.  Your symptoms do not begin to resolve within 3 days. SEEK IMMEDIATE MEDICAL CARE IF:   You have severe back pain or lower abdominal pain.  You develop chills.  You have nausea or vomiting.  You have continued burning or discomfort with urination. MAKE SURE YOU:   Understand these instructions.  Will watch your condition.  Will get help right away if you are not doing well or get worse.   This information is not intended to replace advice given to you by your health care provider. Make sure you discuss any questions you have with your health care provider.   Document Released: 01/30/2005 Document Revised: 01/11/2015 Document Reviewed: 05/31/2011 Elsevier Interactive Patient Education [employer]2016 [employer]

## 2015-07-11 NOTE — MAU Provider Note (Signed)
Chief Complaint: Postpartum Complications   First Provider Initiated Contact with Patient 07/11/15 1826      SUBJECTIVE HPI: Carol Welch is a 40 y.o. [id number] who is PPD#11 following NSVD who presents to maternity admissions reporting increase in vaginal bleeding 4 days ago with large clots daily then onset of abdominal cramping pain last night that is 10/10 on pain scale.  The pain is associated with nausea.  She has tried ibuprofen and Tylenol which reduce the pain slightly.  She reports her bleeding as soaking 1 pad every 3 hours but with larger than golf ball sized clots several times per day.  Nothing makes the bleeding or pain worse.   She denies vaginal itching/burning, urinary symptoms, h/a, dizziness, vomiting, or fever/chills.     HPI  Past Medical History  Diagnosis Date  . Colitis   . Hypothyroid   . SVD (spontaneous vaginal delivery) 06/30/2015   Past Surgical History  Procedure Laterality Date  . No past surgeries     Social History   Social History  . Marital Status: Divorced    Spouse Name: N/A  . Number of Children: N/A  . Years of Education: N/A   Occupational History  . Not on file.   Social History Main Topics  . Smoking status: Never Smoker   . Smokeless tobacco: Never Used  . Alcohol Use: No  . Drug Use: No  . Sexual Activity: Yes    Birth Control/ Protection: None   Other Topics Concern  . Not on file   Social History Narrative   No current facility-administered medications on file prior to encounter.   Current Outpatient Prescriptions on File Prior to Encounter  Medication Sig Dispense Refill  . acetaminophen (TYLENOL) 500 MG tablet Take 1,000 mg by mouth every 6 (six) hours as needed for mild pain.    . ferrous sulfate 325 (65 FE) MG tablet Take 325 mg by mouth daily with breakfast.    . ibuprofen (ADVIL,MOTRIN) 800 MG tablet Take 1 tablet (800 mg total) by mouth every 6 (six) hours. 45 tablet 1  . levothyroxine (SYNTHROID, LEVOTHROID) 100  MCG tablet Take 100 mcg by mouth daily before breakfast.     . Prenatal Vit-Fe Fumarate-FA (PRENATAL MULTIVITAMIN) TABS tablet Take 1 tablet by mouth at bedtime. 100 tablet 3   No Known Allergies  ROS:  Review of Systems  Constitutional: Negative for fever, chills and fatigue.  Respiratory: Negative for shortness of breath.   Cardiovascular: Negative for chest pain.  Gastrointestinal: Positive for abdominal pain.  Genitourinary: Positive for vaginal bleeding and pelvic pain. Negative for dysuria, flank pain, vaginal discharge, difficulty urinating and vaginal pain.  Neurological: Negative for dizziness and headaches.  Psychiatric/Behavioral: Negative.      I have reviewed patient's Past Medical Hx, Surgical Hx, Family Hx, Social Hx, medications and allergies.   Physical Exam   Patient Vitals for the past 24 hrs:  BP Temp Temp src Pulse Resp  07/11/15 1746 115/57 mmHg 98 F (36.7 C) Oral 66 16   Constitutional: Well-developed, well-nourished female in no acute distress.  Cardiovascular: normal rate Respiratory: normal effort GI: Abd soft, non-tender. Pos BS x 4 MS: Extremities nontender, no edema, normal ROM Neurologic: Alert and oriented x 4.  GU: Neg CVAT.  PELVIC EXAM: Cervix pink, visually 1 cm, friable to cotton swab, small amount bright red bleeding, vaginal walls and external genitalia normal Bimanual exam: Cervix 0/long/high, firm, anterior, positive CMT, uterus tender, nonenlarged, adnexa without tenderness, enlargement, or  mass    LAB RESULTS Results for orders placed or performed during the hospital encounter of 07/11/15 (from the past 24 hour(s))  Urinalysis, Routine w reflex microscopic (not at [facility])     Status: Abnormal   Collection Time: 07/11/15  5:30 PM  Result Value Ref Range   Color, Urine YELLOW YELLOW   APPearance HAZY (A) CLEAR   Specific Gravity, Urine 1.025 1.005 - 1.030   pH 5.5 5.0 - 8.0   Glucose, UA NEGATIVE NEGATIVE mg/dL   Hgb urine  dipstick LARGE (A) NEGATIVE   Bilirubin Urine NEGATIVE NEGATIVE   Ketones, ur NEGATIVE NEGATIVE mg/dL   Protein, ur NEGATIVE NEGATIVE mg/dL   Nitrite NEGATIVE NEGATIVE   Leukocytes, UA LARGE (A) NEGATIVE  Urine microscopic-add on     Status: Abnormal   Collection Time: 07/11/15  5:30 PM  Result Value Ref Range   Squamous Epithelial / LPF 6-30 (A) NONE SEEN   WBC, UA TOO NUMEROUS TO COUNT 0 - 5 WBC/hpf   RBC / HPF 6-30 0 - 5 RBC/hpf   Bacteria, UA RARE (A) NONE SEEN  CBC     Status: Abnormal   Collection Time: 07/11/15  6:36 PM  Result Value Ref Range   WBC 7.4 4.0 - 10.5 K/uL   RBC 4.30 3.87 - 5.11 MIL/uL   Hemoglobin 11.9 (L) 12.0 - 15.0 g/dL   HCT 16.1 (L) 09.6 - 04.5 %   MCV 82.1 78.0 - 100.0 fL   MCH 27.7 26.0 - 34.0 pg   MCHC 33.7 30.0 - 36.0 g/dL   RDW 40.9 81.1 - 91.4 %   Platelets 274 150 - 400 K/uL  Comprehensive metabolic panel     Status: Abnormal   Collection Time: 07/11/15  6:36 PM  Result Value Ref Range   Sodium 139 135 - 145 mmol/L   Potassium 3.9 3.5 - 5.1 mmol/L   Chloride 107 101 - 111 mmol/L   CO2 24 22 - 32 mmol/L   Glucose, Bld 90 65 - 99 mg/dL   BUN 18 6 - 20 mg/dL   Creatinine, Ser 7.82 0.44 - 1.00 mg/dL   Calcium 8.4 (L) 8.9 - 10.3 mg/dL   Total Protein 6.9 6.5 - 8.1 g/dL   Albumin 3.3 (L) 3.5 - 5.0 g/dL   AST 23 15 - 41 U/L   ALT 27 14 - 54 U/L   Alkaline Phosphatase 83 38 - 126 U/L   Total Bilirubin 0.4 0.3 - 1.2 mg/dL   GFR calc non Af Amer >60 >60 mL/min   GFR calc Af Amer >60 >60 mL/min   Anion gap 8 5 - 15  Wet prep, genital     Status: Abnormal   Collection Time: 07/11/15  6:45 PM  Result Value Ref Range   Yeast Wet Prep HPF POC NONE SEEN NONE SEEN   Trich, Wet Prep NONE SEEN NONE SEEN   Clue Cells Wet Prep HPF POC NONE SEEN NONE SEEN   WBC, Wet Prep HPF POC MANY (A) NONE SEEN   Sperm NONE SEEN     --/--/A POS (02/24 1405)  IMAGING US Pelvis Complete  07/11/2015  CLINICAL DATA:  Eleven days postpartum. Postpartum bleeding,  right lower quadrant pain, chills, and nausea. EXAM: TRANSABDOMINAL ULTRASOUND OF PELVIS TECHNIQUE: Transabdominal ultrasound examination of the pelvis was performed including evaluation of the uterus, ovaries, adnexal regions, and pelvic cul-de-sac. COMPARISON:  None. FINDINGS: Uterus Measurements: 12.6 x 8.2 x 10.5 cm. No fibroids or other mass visualized.  Endometrium Thickness: 6 mm. No focal endometrial lesion identified. Small amount of fluid is seen throughout the endometrial cavity. This is nonspecific but may be seen with endometritis. Right ovary Measurements: 1.4 x 0.8 x 1.3 cm. Normal appearance/no adnexal mass. Left ovary Measurements: 2.6 x 1.3 x 2.0 cm. Normal appearance/no adnexal mass. Other findings:  No abnormal free fluid. IMPRESSION: No evidence retained products of conception. Small amount of fluid seen throughout the endometrial cavity, which is nonspecific but may be seen with endometritis. No fibroids, adnexal mass or free fluid identified. Electronically Signed   By: Myles RosenthalJohn  Stahl M.D.   On: 07/11/2015 19:44   [place] [facility] Fetal Bpp Wo Non Stress  06/24/2015  OBSTETRICAL ULTRASOUND: This exam was performed within a [facility]. The OB US report was generated in the AS system, and faxed to the ordering physician.  This report is available in the [employer] PACS. See the AS Obstetric US report via the Image Link.  [place] Mfm Ob Limited  06/24/2015  OBSTETRICAL ULTRASOUND: This exam was performed within a [facility]. The OB US report was generated in the AS system, and faxed to the ordering physician.  This report is available in the [employer] PACS. See the AS Obstetric US report via the Image Link.   MAU Management/MDM: Ordered labs and reviewed results.  Consult Dr Hinton RaoBovard-Stuckert.  No evidence of retained POCs.  Likely endometritis vs UTI.  Treat with Augmentin 875 BID x 10 days, follow up in office in 1 week.  Return to MAU for emergencies. Treatments in  MAU included Toradol 60 mg IM x 1. Pt stable at time of discharge.  ASSESSMENT 1. SVD (spontaneous vaginal delivery)   2. Postpartum pain   3. Excessive postpartum bleeding   4. Acute cystitis without hematuria     PLAN Discharge home Augmentin Rx for 10 days   Follow-up Information    Follow up with Bovard-Stuckert, Jody, MD. Schedule an appointment as soon as possible for a visit in 1 week.   Specialty:  Obstetrics and Gynecology   Why:  Return to MAU as needed for emergencies   Contact information:   [address] [place] [id number] [phone]       Sharen CounterLisa Leftwich-Kirby Certified Nurse-Midwife 07/11/2015  8:06 PM

## 2015-07-11 NOTE — Discharge Instructions (Signed)
Postpartum Hemorrhage Postpartum hemorrhage is excessive blood loss after childbirth. Some blood loss is normal after delivering a baby. However, postpartum hemorrhage is a potentially serious condition.  CAUSES   A loss of muscle tone in the uterus after childbirth.  Failure to deliver all of the placenta.  Wounds in the birth canal caused by delivery of the fetus.  A maternal bleeding disorder that prevents blood clotting (rare). RISK FACTORS You are at greater risk for postpartum hemorrhage if you:  Have a history of postpartum hemorrhage.  Have delivered more than one baby.  Had preeclampsia or eclampsia.  Had problems with the placenta.  Had complications during your labor or delivery.  Are obese.  Are Asian or Hispanic. SIGNS AND SYMPTOMS  Vaginal bleeding after delivery is normal and should be expected. Bleeding (lochia) will occur for several days after childbirth. This can be expected with normal vaginal deliveries and cesarean deliveries.  You are bleeding too much after your delivery if you are:  Passing large clots or pieces of tissue. This may be small pieces of placenta left after delivery.   Soaking more than one sanitary pad per hour for several hours.   Having heavy, bright-red bleeding that occurs 4 days or more after delivery.   Having a discharge that has a bad smell or if you begin to run an unexplained fever.   Having times of lightheadedness or fainting, feeling short of breath, or having your heart beat fast with very little activity.  DIAGNOSIS  A diagnosis is based on your symptoms and a physical exam of your perineum, vagina, cervix, and uterus. Diagnostic tests may include:  Blood pressure and pulse.  Blood tests.  Blood clotting tests.  Ultrasonography. TREATMENT  Treatment is based on the severity of bleeding and may include:  Uterine massage.  Medicines.  Blood transfusions.  Sometimes bleeding occurs if portions of the  placenta are left behind in the uterus after delivery. If this happens, often a curettage or scraping of the inside of the uterus must be done. This usually stops the bleeding. If this treatment does not stop the bleeding, surgery (hysterectomy) may have to be performed to remove the uterus.  If bleeding is due to clotting or bleeding problems that are not related to the pregnancy, other treatments may be needed.  HOME CARE INSTRUCTIONS   Limit your activity as directed by your health care provider.Your health care provider may order bed rest (getting up to the bathroom only) or may allow you to continue light activity.   Keep track of the number of pads you use each day and how soaked (saturated) they are. Write this number down.   Do not use tampons. Do not douche or have sexual intercourse until approved by your health care provider.   Drink enough fluids to keep your urine clear or pale yellow.   Get proper amounts of rest.   Eat foods that are rich in iron, such as spinach, red meat, and legumes.  SEEK IMMEDIATE MEDICAL CARE IF:  You experience severe cramps in your stomach, back, or belly (abdomen).   You have a fever.   You pass large clots or tissue. Save any tissue for your health care provider to look at.   Your bleeding increases.  You become weak or lightheaded, or you pass out.   Your sanitary pad count per hour is increasing. MAKE SURE YOU:  Understand these instructions.  Will watch your condition.  Will get help right away if  you are not doing well or get worse.   This information is not intended to replace advice given to you by your health care provider. Make sure you discuss any questions you have with your health care provider.   Document Released: 07/13/2003 Document Revised: 04/27/2013 Document Reviewed: 10/08/2012 Elsevier Interactive Patient Education 2016 Else[employer] Inc[employer]  Pelvic Pain, Female Female pelvic pain can be caused by many  different things and start from a variety of places. Pelvic pain refers to pain that is located in the lower half of the abdomen and between your hips. The pain may occur over a short period of time (acute) or may be reoccurring (chronic). The cause of pelvic pain may be related to disorders affecting the female reproductive organs (gynecologic), but it may also be related to the bladder, kidney stones, an intestinal complication, or muscle or skeletal problems. Getting help right away for pelvic pain is important, especially if there has been severe, sharp, or a sudden onset of unusual pain. It is also important to get help right away because some types of pelvic pain can be life threatening.  CAUSES  Below are only some of the causes of pelvic pain. The causes of pelvic pain can be in one of several categories.   Gynecologic.  Pelvic inflammatory disease.  Sexually transmitted infection.  Ovarian cyst or a twisted ovarian ligament (ovarian torsion).  Uterine lining that grows outside the uterus (endometriosis).  Fibroids, cysts, or tumors.  Ovulation.  Pregnancy.  Pregnancy that occurs outside the uterus (ectopic pregnancy).  Miscarriage.  Labor.  Abruption of the placenta or ruptured uterus.  Infection.  Uterine infection (endometritis).  Bladder infection.  Diverticulitis.  Miscarriage related to a uterine infection (septic abortion).  Bladder.  Inflammation of the bladder (cystitis).  Kidney stone(s).  Gastrointestinal.  Constipation.  Diverticulitis.  Neurologic.  Trauma.  Feeling pelvic pain because of mental or emotional causes (psychosomatic).  Cancers of the bowel or pelvis. EVALUATION  Your caregiver will want to take a careful history of your concerns. This includes recent changes in your health, a careful gynecologic history of your periods (menses), and a sexual history. Obtaining your family history and medical history is also important. Your  caregiver may suggest a pelvic exam. A pelvic exam will help identify the location and severity of the pain. It also helps in the evaluation of which organ system may be involved. In order to identify the cause of the pelvic pain and be properly treated, your caregiver may order tests. These tests may include:   A pregnancy test.  Pelvic ultrasonography.  An X-ray exam of the abdomen.  A urinalysis or evaluation of vaginal discharge.  Blood tests. HOME CARE INSTRUCTIONS   Only take over-the-counter or prescription medicines for pain, discomfort, or fever as directed by your caregiver.   Rest as directed by your caregiver.   Eat a balanced diet.   Drink enough fluids to make your urine clear or pale yellow, or as directed.   Avoid sexual intercourse if it causes pain.   Apply warm or cold compresses to the lower abdomen depending on which one helps the pain.   Avoid stressful situations.   Keep a journal of your pelvic pain. Write down when it started, where the pain is located, and if there are things that seem to be associated with the pain, such as food or your menstrual cycle.  Follow up with your caregiver as directed.  SEEK MEDICAL CARE IF:  Your medicine does  not help your pain.  You have abnormal vaginal discharge. SEEK IMMEDIATE MEDICAL CARE IF:   You have heavy bleeding from the vagina.   Your pelvic pain increases.   You feel light-headed or faint.   You have chills.   You have pain with urination or blood in your urine.   You have uncontrolled diarrhea or vomiting.   You have a fever or persistent symptoms for more than 3 days.  You have a fever and your symptoms suddenly get worse.   You are being physically or sexually abused.   This information is not intended to replace advice given to you by your health care provider. Make sure you discuss any questions you have with your health care provider.   Document Released: 03/19/2004  Document Revised: 01/11/2015 Document Reviewed: 08/12/2011 Elsevier Interactive Patient Education [employer]

## 2015-07-11 NOTE — MAU Note (Addendum)
C/o abdominal pain since yesterday and heavy vaginal bleeding with large clots for past 4 days; delivered vaginally on 06/30/15 around [redacted] weeks gestation;

## 2015-07-11 NOTE — ED Provider Notes (Signed)
CSN: [id number]     Arrival date & time 07/11/15  1459 History   First MD Initiated Contact with Patient 07/11/15 1601     Chief Complaint  Patient presents with  . Abdominal Pain    post op delivery 2/25   (Consider location/radiation/quality/duration/timing/severity/associated sxs/prior Treatment) HPI Comments: 40 year old female is 11 days postpartum after spontaneous vaginal delivery presenting with persistent vaginal bleeding and new onset of pelvic pain yesterday. Pelvic pain began at the midline and then radiated left and right. Today the pain is radiating superiorly toward the mid abdomen. It is getting worse. She is complaining of nausea without vomiting. She is using 4-5 pads per day. She has had some chills along with the nausea. No vomiting. She is fully awake and oriented. She is ambulatory. Her vital signs are within normal limits.   Past Medical History  Diagnosis Date  . Colitis   . Hypothyroid   . SVD (spontaneous vaginal delivery) 06/30/2015   Past Surgical History  Procedure Laterality Date  . No past surgeries     Family History  Problem Relation Age of Onset  . Heart disease Father   . Cancer Maternal Aunt   . Cancer Maternal Grandmother   . Heart disease Paternal Grandfather    Social History  Substance Use Topics  . Smoking status: Never Smoker   . Smokeless tobacco: Never Used  . Alcohol Use: No   OB History    Gravida Para Term Preterm AB TAB SAB Ectopic Multiple Living   0 1 0 1 0 0 4     Review of Systems  Constitutional: Positive for activity change. Negative for fever and fatigue.  HENT: Negative.   Respiratory: Negative.  Negative for cough and shortness of breath.   Cardiovascular: Negative for chest pain.  Gastrointestinal: Positive for nausea and abdominal pain. Negative for vomiting.  Genitourinary: Negative.   Musculoskeletal: Negative.   Skin: Negative.   Neurological: Negative.     Allergies  Review of patient's allergies  indicates no known allergies.  Home Medications   Prior to Admission medications   Medication Sig Start Date End Date Taking? Authorizing Provider  acetaminophen (TYLENOL) 500 MG tablet Take 1,000 mg by mouth every 6 (six) hours as needed for mild pain.    Historical Provider, MD  ferrous sulfate 325 (65 FE) MG tablet Take 325 mg by mouth daily with breakfast.    Historical Provider, MD  ibuprofen (ADVIL,MOTRIN) 800 MG tablet Take 1 tablet (800 mg total) by mouth every 6 (six) hours. 07/01/15   Jody Bovard-Stuckert, MD  levothyroxine (SYNTHROID, LEVOTHROID) 100 MCG tablet Take 100 mcg by mouth daily before breakfast.     Historical Provider, MD  Prenatal Vit-Fe Fumarate-FA (PRENATAL MULTIVITAMIN) TABS tablet Take 1 tablet by mouth at bedtime. 07/01/15   Sherian Rein, MD   Meds Ordered and Administered this Visit  Medications - No data to display  BP 98/59 mmHg  Pulse 72  Temp(Src) 97.9 F (36.6 C) (Oral)  Resp 16  SpO2 98%  LMP 10/22/2014 (LMP Unknown) No data found.   Physical Exam  Constitutional: She is oriented to person, place, and time. She appears well-developed and well-nourished. No distress.  Neck: Normal range of motion. Neck supple.  Cardiovascular: Normal rate, regular rhythm and normal heart sounds.   Pulmonary/Chest: Effort normal and breath sounds normal. No respiratory distress.  Abdominal: Soft. There is tenderness. There is guarding.  Neurological: She is alert and oriented to person, place,  and time. She exhibits normal muscle tone.  Skin: Skin is warm and dry.  Psychiatric: She has a normal mood and affect.  Nursing note and vitals reviewed.   ED Course  Procedures (including critical care time)  Labs Review Labs Reviewed - No data to display  Imaging Review No results found.   Visual Acuity Review  Right Eye Distance:   Left Eye Distance:   Bilateral Distance:    Right Eye Near:   Left Eye Near:    Bilateral Near:         MDM    1. Secondary postpartum hemorrhage   2. Pelvic pain in female   3. Status post vaginal delivery    [facility]. Patient understands and agrees and [facility]. Vital signs within normal limits. She is not tachycardic.    Hayden Rasmussenavid Aarnav Steagall, NP 07/11/15 [phone]

## 2015-07-12 LAB — GC/CHLAMYDIA PROBE AMP (~~LOC~~) NOT AT ARMC
CHLAMYDIA, DNA PROBE: NEGATIVE
NEISSERIA GONORRHEA: NEGATIVE

## 2015-07-13 LAB — URINE CULTURE

## 2015-09-20 ENCOUNTER — Encounter (HOSPITAL_COMMUNITY): Payer: Self-pay | Admitting: Emergency Medicine

## 2015-09-20 ENCOUNTER — Emergency Department (HOSPITAL_COMMUNITY)
Admission: EM | Admit: 2015-09-20 | Discharge: 2015-09-20 | Disposition: A | Discharge status: Home / Self Care | Payer: Medicaid Other | Attending: Emergency Medicine | Admitting: Emergency Medicine

## 2015-09-20 ENCOUNTER — Emergency Department (HOSPITAL_COMMUNITY): Payer: Medicaid Other

## 2015-09-20 DIAGNOSIS — Y929 Unspecified place or not applicable: Secondary | ICD-10-CM | POA: Diagnosis not present

## 2015-09-20 DIAGNOSIS — S63501A Unspecified sprain of right wrist, initial encounter: Secondary | ICD-10-CM | POA: Diagnosis not present

## 2015-09-20 DIAGNOSIS — Y939 Activity, unspecified: Secondary | ICD-10-CM | POA: Insufficient documentation

## 2015-09-20 DIAGNOSIS — Z792 Long term (current) use of antibiotics: Secondary | ICD-10-CM | POA: Diagnosis not present

## 2015-09-20 DIAGNOSIS — W1809XA Striking against other object with subsequent fall, initial encounter: Secondary | ICD-10-CM | POA: Insufficient documentation

## 2015-09-20 DIAGNOSIS — Y999 Unspecified external cause status: Secondary | ICD-10-CM | POA: Insufficient documentation

## 2015-09-20 DIAGNOSIS — Z79899 Other long term (current) drug therapy: Secondary | ICD-10-CM | POA: Insufficient documentation

## 2015-09-20 DIAGNOSIS — Z791 Long term (current) use of non-steroidal anti-inflammatories (NSAID): Secondary | ICD-10-CM | POA: Diagnosis not present

## 2015-09-20 DIAGNOSIS — S6991XA Unspecified injury of right wrist, hand and finger(s), initial encounter: Secondary | ICD-10-CM | POA: Diagnosis present

## 2015-09-20 DIAGNOSIS — E039 Hypothyroidism, unspecified: Secondary | ICD-10-CM | POA: Diagnosis not present

## 2015-09-20 MED ORDER — HYDROCODONE-ACETAMINOPHEN 5-325 MG PO TABS: 1.0000 | ORAL_TABLET | Freq: Four times a day (QID) | ORAL | Status: DC | PRN

## 2015-09-20 MED ORDER — IBUPROFEN 800 MG PO TABS: 800.0000 mg | ORAL_TABLET | Freq: Three times a day (TID) | ORAL | Status: DC | PRN

## 2015-09-20 MED ORDER — CLINDAMYCIN HCL 150 MG PO CAPS: 150.0000 mg | ORAL_CAPSULE | Freq: Four times a day (QID) | ORAL | Status: DC

## 2015-09-20 NOTE — ED Provider Notes (Signed)
CSN: [id number]     Arrival date & time 09/20/15  1147 History  By signing my name below, I, Linna DarnerRussell Turner, attest that this documentation has been prepared under the direction and in the presence of non-physician practitioner, Ebbie Ridgehris Onya Eutsler, PA-C. Electronically Signed: Linna Darnerussell Turner, Scribe. 09/20/2015. 12:28 PM.   Chief Complaint  Patient presents with  . Wrist Pain    The history is provided by the patient. No language interpreter was used.     HPI Comments: Carol Welch is a 40 y.o. female who presents to the Emergency Department complaining of sudden onset, constant, right wrist pain and swelling s/p falling yesterday. She states that her foot became stuck in a car railing which caused her to lose balance and fall; she braced her fall by extending her right arm to the ground. Pt states that her pain radiates from her right wrist into her right shoulder. She reports that most of her pain is located at the top of her right wrist. She denies numbness, pain in her right hand, or any other associated symptoms.  Past Medical History  Diagnosis Date  . Colitis   . Hypothyroid   . SVD (spontaneous vaginal delivery) 06/30/2015   Past Surgical History  Procedure Laterality Date  . No past surgeries     Family History  Problem Relation Age of Onset  . Heart disease Father   . Cancer Maternal Aunt   . Cancer Maternal Grandmother   . Heart disease Paternal Grandfather    Social History  Substance Use Topics  . Smoking status: Never Smoker   . Smokeless tobacco: Never Used  . Alcohol Use: No   OB History    Gravida Para Term Preterm AB TAB SAB Ectopic Multiple Living   5 4 4  0 1 0 1 0 0 4     Review of Systems  All other systems negative except as documented in the HPI. All pertinent positives and negatives as reviewed in the HPI.  Allergies  Review of patient's allergies indicates no known allergies.  Home Medications   Prior to Admission medications   Medication Sig Start  Date End Date Taking? Authorizing Provider  acetaminophen (TYLENOL) 500 MG tablet Take 1,000 mg by mouth every 6 (six) hours as needed for mild pain.    Historical Provider, MD  amoxicillin-clavulanate (AUGMENTIN) 875-125 MG tablet Take 1 tablet by mouth 2 (two) times daily. 07/11/15   Wilmer FloorLisa A Leftwich-Kirby, CNM  ferrous sulfate 325 (65 FE) MG tablet Take 325 mg by mouth daily with breakfast.    Historical Provider, MD  ibuprofen (ADVIL,MOTRIN) 800 MG tablet Take 1 tablet (800 mg total) by mouth every 6 (six) hours. 07/01/15   Jody Bovard-Stuckert, MD  levothyroxine (SYNTHROID, LEVOTHROID) 100 MCG tablet Take 100 mcg by mouth daily before breakfast.     Historical Provider, MD  Prenatal Vit-Fe Fumarate-FA (PRENATAL MULTIVITAMIN) TABS tablet Take 1 tablet by mouth at bedtime. 07/01/15   Jody Bovard-Stuckert, MD   BP 108/55 mmHg  Pulse 80  Temp(Src) 98.3 F (36.8 C) (Oral)  Resp 20  SpO2 99%  LMP 08/01/2015 Physical Exam  Constitutional: She is oriented to person, place, and time. She appears well-developed and well-nourished.  HENT:  Head: Normocephalic and atraumatic.  Pulmonary/Chest: Effort normal. No respiratory distress.  Musculoskeletal:  Right wrist: dorsal pain extending around the entire wrist. Right wrist: dorsal pain on palpation. Right wrist: good sensation and cap refill. Right wrist: mild swelling.  Neurological: She is alert and oriented  to person, place, and time.  Skin: Skin is warm and dry.    ED Course  Procedures (including critical care time)  DIAGNOSTIC STUDIES: Oxygen Saturation is 99% on RA, normal by my interpretation.    COORDINATION OF CARE: 12:28 PM Discussed treatment plan with pt at bedside and pt agreed to plan.  Labs Review Labs Reviewed - No data to display  Imaging Review No results found. I have personally reviewed and evaluated these images and lab results as part of my medical decision-making.    Charlestine Night, PA-C 09/27/15  1610  Linwood Dibbles, MD 09/27/15 1051

## 2015-09-20 NOTE — Discharge Instructions (Signed)
REturn here as needed. Ice and elevate the wrist. Follow up with the orthopedist provided. The x-rays were normal.

## 2015-09-20 NOTE — ED Notes (Signed)
Pt reports right wrist pain post fall yesterday, reports pain is radiation to arm and shoulder. Also sts edema in area for which she has been using ice packs . Alert and oriented x4. No obvious distress.

## 2015-09-20 NOTE — ED Notes (Signed)
Ortho tech at bedside 

## 2015-12-26 ENCOUNTER — Emergency Department (HOSPITAL_COMMUNITY)
Admission: EM | Admit: 2015-12-26 | Discharge: 2015-12-26 | Disposition: A | Discharge status: Home / Self Care | Payer: Medicaid Other | Attending: Emergency Medicine | Admitting: Emergency Medicine

## 2015-12-26 ENCOUNTER — Encounter (HOSPITAL_COMMUNITY): Payer: Self-pay | Admitting: Emergency Medicine

## 2015-12-26 DIAGNOSIS — R102 Pelvic and perineal pain: Secondary | ICD-10-CM | POA: Insufficient documentation

## 2015-12-26 DIAGNOSIS — Z79899 Other long term (current) drug therapy: Secondary | ICD-10-CM | POA: Insufficient documentation

## 2015-12-26 DIAGNOSIS — E039 Hypothyroidism, unspecified: Secondary | ICD-10-CM | POA: Insufficient documentation

## 2015-12-26 DIAGNOSIS — N939 Abnormal uterine and vaginal bleeding, unspecified: Secondary | ICD-10-CM | POA: Diagnosis present

## 2015-12-26 DIAGNOSIS — N946 Dysmenorrhea, unspecified: Secondary | ICD-10-CM

## 2015-12-26 LAB — CBC
HEMATOCRIT: 41 % (ref 36.0–46.0)
HEMOGLOBIN: 13.5 g/dL (ref 12.0–15.0)
MCH: 28.8 pg (ref 26.0–34.0)
MCHC: 32.9 g/dL (ref 30.0–36.0)
MCV: 87.4 fL (ref 78.0–100.0)
Platelets: 255 10*3/uL (ref 150–400)
RBC: 4.69 MIL/uL (ref 3.87–5.11)
RDW: 14 % (ref 11.5–15.5)
WBC: 6.2 10*3/uL (ref 4.0–10.5)

## 2015-12-26 LAB — COMPREHENSIVE METABOLIC PANEL
ALBUMIN: 3.9 g/dL (ref 3.5–5.0)
ALT: 13 U/L — ABNORMAL LOW (ref 14–54)
AST: 18 U/L (ref 15–41)
Alkaline Phosphatase: 47 U/L (ref 38–126)
Anion gap: 5 (ref 5–15)
BUN: 18 mg/dL (ref 6–20)
CHLORIDE: 108 mmol/L (ref 101–111)
CO2: 25 mmol/L (ref 22–32)
Calcium: 9.1 mg/dL (ref 8.9–10.3)
Creatinine, Ser: 0.88 mg/dL (ref 0.44–1.00)
GFR calc Af Amer: >60 mL/min (ref >60)
GFR calc non Af Amer: >60 mL/min (ref >60)
Glucose, Bld: 112 mg/dL — ABNORMAL HIGH (ref 65–99)
POTASSIUM: 4.2 mmol/L (ref 3.5–5.1)
SODIUM: 138 mmol/L (ref 135–145)
Total Bilirubin: 0.5 mg/dL (ref 0.3–1.2)
Total Protein: 6.8 g/dL (ref 6.5–8.1)

## 2015-12-26 LAB — I-STAT BETA HCG BLOOD, ED (MC, WL, AP ONLY): I-stat hCG, quantitative: <5 m[IU]/mL (ref <5)

## 2015-12-26 LAB — URINE MICROSCOPIC-ADD ON

## 2015-12-26 LAB — URINALYSIS, ROUTINE W REFLEX MICROSCOPIC
BILIRUBIN URINE: NEGATIVE
Glucose, UA: NEGATIVE mg/dL
Ketones, ur: NEGATIVE mg/dL
Leukocytes, UA: NEGATIVE
Nitrite: NEGATIVE
PH: 6 (ref 5.0–8.0)
Protein, ur: NEGATIVE mg/dL
SPECIFIC GRAVITY, URINE: 1.011 (ref 1.005–1.030)

## 2015-12-26 LAB — WET PREP, GENITAL
CLUE CELLS WET PREP: NONE SEEN
Sperm: NONE SEEN
Trich, Wet Prep: NONE SEEN
Yeast Wet Prep HPF POC: NONE SEEN

## 2015-12-26 LAB — LIPASE, BLOOD: LIPASE: 32 U/L (ref 11–51)

## 2015-12-26 NOTE — ED Provider Notes (Signed)
[facility]-EMERGENCY DEPT Provider Note   CSN: [id number] Arrival date & time: 12/26/15  1557    History   Chief Complaint Chief Complaint  Patient presents with  . Vaginal Bleeding  . Abdominal Pain    HPI Cece Milhouse is a 40 y.o. female.  40 year old female with a history of hypothyroid and colitis presents to the emergency department for evaluation of abdominal pain. She reports that symptoms began as nausea with sporadic episodes of emesis 4 days ago. This was followed by onset of vaginal bleeding and some lower abdominal cramping. She states that bleeding has been intermittent and waxing and waning in severity. She reports little vaginal bleeding at this time. She states that she took a pregnancy test 3 days ago which appeared vaguely positive. Her last menstrual period was in April 2017 and was her first period following SVD. Patient states that she has been taking Tylenol for pain with moderate improvement, but pain returns when this medication wears off. She reports having a fever of 102F yesterday. She is afebrile today. No reported hematemesis, dysuria, hematuria, or bowel changes.     Past Medical History:  Diagnosis Date  . Colitis   . Hypothyroid   . SVD (spontaneous vaginal delivery) 06/30/2015    Patient Active Problem List   Diagnosis Date Noted  . Labor and delivery, indication for care 06/30/2015  . SVD (spontaneous vaginal delivery) 06/30/2015  . Incompetent cervix during second trimester, antepartum 03/19/2015  . Preterm contractions 03/18/2015    Past Surgical History:  Procedure Laterality Date  . NO PAST SURGERIES      OB History    Gravida Para Term Preterm AB Living   5 4 4  0 1 4   SAB TAB Ectopic Multiple Live Births   1 0 0 0 1       Home Medications    Prior to Admission medications   Medication Sig Start Date End Date Taking? Authorizing Provider  acetaminophen (TYLENOL) 500 MG tablet Take 1,000 mg by mouth every 6 (six) hours as needed  for mild pain.    Historical Provider, MD  amoxicillin-clavulanate (AUGMENTIN) 875-125 MG tablet Take 1 tablet by mouth 2 (two) times daily. 07/11/15   Wilmer Floor Leftwich-Kirby, CNM  ferrous sulfate 325 (65 FE) MG tablet Take 325 mg by mouth daily with breakfast.    Historical Provider, MD  HYDROcodone-acetaminophen (NORCO/VICODIN) 5-325 MG tablet Take 1 tablet by mouth every 6 (six) hours as needed for moderate pain. 09/20/15   Charlestine Night, PA-C  ibuprofen (ADVIL,MOTRIN) 800 MG tablet Take 1 tablet (800 mg total) by mouth every 8 (eight) hours as needed. 09/20/15   Charlestine Night, PA-C  levothyroxine (SYNTHROID, LEVOTHROID) 100 MCG tablet Take 100 mcg by mouth daily before breakfast.     Historical Provider, MD  Prenatal Vit-Fe Fumarate-FA (PRENATAL MULTIVITAMIN) TABS tablet Take 1 tablet by mouth at bedtime. 07/01/15   Sherian Rein, MD    Family History Family History  Problem Relation Age of Onset  . Heart disease Father   . Cancer Maternal Aunt   . Cancer Maternal Grandmother   . Heart disease Paternal Grandfather     Social History Social History  Substance Use Topics  . Smoking status: Never Smoker  . Smokeless tobacco: Never Used  . Alcohol use No     Allergies   Review of patient's allergies indicates no known allergies.   Review of Systems Review of Systems  Constitutional: Positive for fever (Reported 102F yesterday).  Gastrointestinal:  Positive for abdominal pain, nausea and vomiting.  Genitourinary: Positive for pelvic pain and vaginal bleeding. Negative for dysuria.  Ten systems reviewed and are negative for acute change, except as noted in the HPI.    Physical Exam Updated Vital Signs BP 129/74   Pulse 67   Temp 98.5 F (36.9 C) (Oral)   Resp 18   SpO2 100%   Physical Exam  Constitutional: She is oriented to person, place, and time. She appears well-developed and well-nourished. No distress.  Nontoxic/nonseptic appearing  HENT:  Head:  Normocephalic and atraumatic.  Eyes: Conjunctivae and EOM are normal. No scleral icterus.  Neck: Normal range of motion.  Pulmonary/Chest: Effort normal. No respiratory distress. She has no wheezes.  Respirations even and unlabored  Abdominal: Soft. She exhibits no distension and no mass. There is tenderness. There is no guarding.  TTP to suprapubic abdomen and mildly to LLQ. No masses. No peritoneal signs.  Genitourinary: There is no rash, tenderness or lesion on the right labia. There is no rash, tenderness or lesion on the left labia. Uterus is tender. Cervix exhibits discharge and friability. Cervix exhibits no motion tenderness. Right adnexum displays no tenderness. Left adnexum displays tenderness (mild). No bleeding in the vagina. No foreign body in the vagina. Vaginal discharge (scant, light brown, clear) found.  Musculoskeletal: Normal range of motion.  Neurological: She is alert and oriented to person, place, and time.  GCS 15. Patient moving all extremities.  Skin: Skin is warm and dry. No rash noted. She is not diaphoretic. No erythema. No pallor.  Psychiatric: She has a normal mood and affect. Her behavior is normal.  Nursing note and vitals reviewed.    ED Treatments / Results  Labs (all labs ordered are listed, but only abnormal results are displayed) Labs Reviewed  WET PREP, GENITAL - Abnormal; Notable for the following:       Result Value   WBC, Wet Prep HPF POC MANY (*)    All other components within normal limits  COMPREHENSIVE METABOLIC PANEL - Abnormal; Notable for the following:    Glucose, Bld 112 (*)    ALT 13 (*)    All other components within normal limits  URINALYSIS, ROUTINE W REFLEX MICROSCOPIC (NOT AT [facility]) - Abnormal; Notable for the following:    Color, Urine STRAW (*)    Hgb urine dipstick SMALL (*)    All other components within normal limits  URINE MICROSCOPIC-ADD ON - Abnormal; Notable for the following:    Squamous Epithelial / LPF 0-5 (*)     Bacteria, UA RARE (*)    All other components within normal limits  LIPASE, BLOOD  CBC  I-STAT BETA HCG BLOOD, ED ([facility], [facility], [facility] ONLY)  GC/CHLAMYDIA PROBE AMP ([place]) NOT AT [facility]    EKG  EKG Interpretation None       Radiology No results found.  Procedures Procedures (including critical care time)  Medications Ordered in ED Medications - No data to display   Initial Impression / Assessment and Plan / ED Course  I have reviewed the triage vital signs and the nursing notes.  Pertinent labs & imaging results that were available during my care of the patient were reviewed by me and considered in my medical decision making (see chart for details).  Clinical Course    40 year old female presents to the emergency department for evaluation of lower abdominal pain in the setting of intermittent vaginal bleeding. She has a history of irregular periods since birth  of her child earlier this year. Her last menstrual period was in April. Patient is afebrile, with stable vital signs. She does report fever yesterday, but has no other evidence of infectious etiology. She has no leukocytosis today. No evidence of urinary tract infection.  Patient with some focal uterine tenderness as well as left adnexal tenderness which is mild. Pregnancy is negative, ruling out ectopic. Given chronicity of cramping discomfort, doubt ovarian torsion. Patient with scant discharge. No expressed concern for STDs. Doubt TOA given lack of fever and leukocytosis.  Suspect dysmenorrhea to be secondary to irregular menses. Patient sitting up in the bed on reexam, stating that she needs to leave to get her children. Given her reassuring workup, I do not believe further emergent imaging is indicated. Patient to be notified if she tests positive for gonorrhea or chlamydia. She has been advised to follow-up with her OB/GYN regarding her visit today. Return precautions discussed and provided. Patient discharged in  satisfactory condition with no unaddressed concerns.   Final Clinical Impressions(s) / ED Diagnoses   Final diagnoses:  Dysmenorrhea    Vitals:   12/26/15 1647 12/26/15 2045 12/26/15 2100 12/26/15 2245  BP: 152/64 109/69 129/74   Pulse: 84 68 67   Resp: 18     Temp: 98.2 F (36.8 C)   98.5 F (36.9 C)  TempSrc: Oral   Oral  SpO2: 100% 100% 100%     New Prescriptions Discharge Medication List as of 12/26/2015 10:42 PM       Antony MaduraKelly Odas Ozer, PA-C 12/26/15 2331    Azalia BilisKevin Campos, MD 12/27/15 0025

## 2015-12-26 NOTE — Discharge Instructions (Signed)
Take Tylenol or ibuprofen for continued pain. Follow-up with your OB/GYN. You may return for any new or concerning symptoms.

## 2015-12-26 NOTE — ED Triage Notes (Signed)
Pt sts lower abd pain and some intermittent vaginal bleeding; pt sts took home pregnancy test was positive on Friday; bleeding happened afterwards; pt sts fever and chills

## 2015-12-26 NOTE — ED Notes (Signed)
Pt c/o lower abd pain off and on for 2 days.  St's she had a episode earlier today with heavy vag. Bleeding.  Pt st's bleeding is min. At this time.

## 2015-12-27 LAB — GC/CHLAMYDIA PROBE AMP (~~LOC~~) NOT AT ARMC
Chlamydia: NEGATIVE
NEISSERIA GONORRHEA: NEGATIVE

## 2016-05-06 NOTE — L&D Delivery Note (Signed)
Delivery Note Ptlabored quickly to complete after AROM with intense pressure.  At 10:34 AM a viable female was delivered via  (Presentation: LOA;  ).  APGAR: 8,9 , ; weight pending  .   Placenta status: delivered intact, .  Cord: 3vc with the following complications:none .  Cord pH: n/a  Anesthesia:  Epidural Episiotomy:  none Lacerations:  none Suture Repair: n/a Est. Blood Loss (mL):  200ml  Mom to postpartum.  Baby to Couplet care / Skin to Skin  Pt decided does not want the BTL.; husband will likely get vasectomy.  Carol Welch 03/24/2017, 10:43 AM

## 2016-08-27 DIAGNOSIS — Z8279 Family history of other congenital malformations, deformations and chromosomal abnormalities: Secondary | ICD-10-CM | POA: Insufficient documentation

## 2016-08-31 ENCOUNTER — Encounter (HOSPITAL_COMMUNITY): Payer: Self-pay

## 2016-08-31 ENCOUNTER — Inpatient Hospital Stay (HOSPITAL_COMMUNITY)
Admission: AD | Admit: 2016-08-31 | Discharge: 2016-08-31 | Disposition: A | Discharge status: Home / Self Care | Payer: Medicaid Other | Source: Ambulatory Visit | Attending: Obstetrics and Gynecology | Admitting: Obstetrics and Gynecology

## 2016-08-31 DIAGNOSIS — E039 Hypothyroidism, unspecified: Secondary | ICD-10-CM | POA: Insufficient documentation

## 2016-08-31 DIAGNOSIS — O209 Hemorrhage in early pregnancy, unspecified: Secondary | ICD-10-CM

## 2016-08-31 DIAGNOSIS — O99281 Endocrine, nutritional and metabolic diseases complicating pregnancy, first trimester: Secondary | ICD-10-CM | POA: Diagnosis not present

## 2016-08-31 DIAGNOSIS — Z3A09 9 weeks gestation of pregnancy: Secondary | ICD-10-CM | POA: Diagnosis not present

## 2016-08-31 LAB — POCT PREGNANCY, URINE: Preg Test, Ur: POSITIVE — AB

## 2016-08-31 LAB — WET PREP, GENITAL
SPERM: NONE SEEN
TRICH WET PREP: NONE SEEN
Yeast Wet Prep HPF POC: NONE SEEN

## 2016-08-31 LAB — URINALYSIS, ROUTINE W REFLEX MICROSCOPIC
Bilirubin Urine: NEGATIVE
GLUCOSE, UA: NEGATIVE mg/dL
KETONES UR: NEGATIVE mg/dL
Nitrite: NEGATIVE
PROTEIN: NEGATIVE mg/dL
Specific Gravity, Urine: 1.015 (ref 1.005–1.030)
pH: 7 (ref 5.0–8.0)

## 2016-08-31 NOTE — Discharge Instructions (Signed)
Vaginal Bleeding During Pregnancy, First Trimester °A small amount of bleeding (spotting) from the vagina is common in early pregnancy. Sometimes the bleeding is normal and is not a problem, and sometimes it is a sign of something serious. Be sure to tell your doctor about any bleeding from your vagina right away. °Follow these instructions at home: °· Watch your condition for any changes. °· Follow your doctor's instructions about how active you can be. °· If you are on bed rest: °¨ You may need to stay in bed and only get up to use the bathroom. °¨ You may be allowed to do some activities. °¨ If you need help, make plans for someone to help you. °· Write down: °¨ The number of pads you use each day. °¨ How often you change pads. °¨ How soaked (saturated) your pads are. °· Do not use tampons. °· Do not douche. °· Do not have sex or orgasms until your doctor says it is okay. °· If you pass any tissue from your vagina, save the tissue so you can show it to your doctor. °· Only take medicines as told by your doctor. °· Do not take aspirin because it can make you bleed. °· Keep all follow-up visits as told by your doctor. °Contact a doctor if: °· You bleed from your vagina. °· You have cramps. °· You have labor pains. °· You have a fever that does not go away after you take medicine. °Get help right away if: °· You have very bad cramps in your back or belly (abdomen). °· You pass large clots or tissue from your vagina. °· You bleed more. °· You feel light-headed or weak. °· You pass out (faint). °· You have chills. °· You are leaking fluid or have a gush of fluid from your vagina. °· You pass out while pooping (having a bowel movement). °This information is not intended to replace advice given to you by your health care provider. Make sure you discuss any questions you have with your health care provider. °Document Released: 09/06/2013 Document Revised: 09/28/2015 Document Reviewed: 12/28/2012 °Elsevier Interactive  Patient Education © 2017 [employer] ° °

## 2016-08-31 NOTE — MAU Provider Note (Signed)
Chief Complaint: Vaginal Bleeding   First Provider Initiated Contact with Patient 08/31/16 1230        SUBJECTIVE HPI: Carol Welch is a 41 y.o. [id number] at [redacted]w[redacted]d by LMP who presents to maternity admissions reporting leaking of fluid from vagina with a small amount of bleeding also.  No pain. She denies vaginal itching/burning, urinary symptoms, h/a, dizziness, n/v, or fever/chills.    Vaginal Bleeding  The patient's primary symptoms include vaginal bleeding. The patient's pertinent negatives include no genital itching, genital lesions, genital odor, genital rash, pelvic pain or vaginal discharge. This is a new problem. The current episode started today. The problem occurs intermittently. The problem has been unchanged. The patient is experiencing no pain. The problem affects both sides. She is pregnant. Pertinent negatives include no abdominal pain, back pain, chills, constipation, diarrhea, dysuria, fever, flank pain, nausea or vomiting. The vaginal discharge was bloody. The vaginal bleeding is spotting. She has not been passing clots. She has not been passing tissue. Nothing aggravates the symptoms. She has tried nothing for the symptoms.    RN Note: G5P4 @ [redacted] wksga. Dx pregnancy March 26 at [facility] office and U/ s done on April 24 and heart beat noted. Presents to triage for leaking fluid since Thursday,and bright red bleeding started this morning requiring to wear pad.  Past Medical History:  Diagnosis Date  . Colitis   . Hypothyroid   . SVD (spontaneous vaginal delivery) 06/30/2015   Past Surgical History:  Procedure Laterality Date  . NO PAST SURGERIES     Social History   Social History  . Marital status: Divorced    Spouse name: N/A  . Number of children: N/A  . Years of education: N/A   Occupational History  . Not on file.   Social History Main Topics  . Smoking status: Never Smoker  . Smokeless tobacco: Never Used  . Alcohol use No  . Drug use: No  . Sexual activity: Yes     Birth control/ protection: None   Other Topics Concern  . Not on file   Social History Narrative  . No narrative on file   No current facility-administered medications on file prior to encounter.    Current Outpatient Prescriptions on File Prior to Encounter  Medication Sig Dispense Refill  . acetaminophen (TYLENOL) 500 MG tablet Take 1,000 mg by mouth every 6 (six) hours as needed for mild pain.    . ferrous sulfate 325 (65 FE) MG tablet Take 325 mg by mouth at bedtime.     Marland Kitchen levothyroxine (SYNTHROID, LEVOTHROID) 100 MCG tablet Take 100 mcg by mouth daily before breakfast.     . Prenatal Vit-Fe Fumarate-FA (PRENATAL MULTIVITAMIN) TABS tablet Take 1 tablet by mouth at bedtime. 100 tablet 3   No Known Allergies  I have reviewed patient's Past Medical Hx, Surgical Hx, Family Hx, Social Hx, medications and allergies.   ROS:  Review of Systems  Constitutional: Negative for chills and fever.  Gastrointestinal: Negative for abdominal pain, constipation, diarrhea, nausea and vomiting.  Genitourinary: Positive for vaginal bleeding. Negative for dysuria, flank pain, pelvic pain and vaginal discharge.  Musculoskeletal: Negative for back pain.   Review of Systems  Other systems negative   Physical Exam  Physical Exam Patient Vitals for the past 24 hrs:  BP Temp Temp src Pulse Resp  08/31/16 1128 103/72 98.6 F (37 C) Oral 77 18   Constitutional: Well-developed, well-nourished female in no acute distress.  Cardiovascular: normal rate Respiratory: normal  effort GI: Abd soft, non-tender. Pos BS x 4 MS: Extremities nontender, no edema, normal ROM Neurologic: Alert and oriented x 4.  GU: Neg CVAT.  PELVIC EXAM: Cervix pink, visually closed, without lesion, scant blood streaked mucous discharge, vaginal walls and external genitalia normal Bimanual exam: Cervix 0/long/high, firm, anterior, neg CMT, uterus nontender, nonenlarged, adnexa without tenderness, enlargement, or mass  FHT  165 by bedside US.  Normal appearing single gestational sac.  + yolk sac.  Single fetal pole with HR visible  LAB RESULTS Results for orders placed or performed during the hospital encounter of 08/31/16 (from the past 24 hour(s))  Urinalysis, Routine w reflex microscopic     Status: Abnormal   Collection Time: 08/31/16 11:05 AM  Result Value Ref Range   Color, Urine YELLOW YELLOW   APPearance CLEAR CLEAR   Specific Gravity, Urine 1.015 1.005 - 1.030   pH 7.0 5.0 - 8.0   Glucose, UA NEGATIVE NEGATIVE mg/dL   Hgb urine dipstick MODERATE (A) NEGATIVE   Bilirubin Urine NEGATIVE NEGATIVE   Ketones, ur NEGATIVE NEGATIVE mg/dL   Protein, ur NEGATIVE NEGATIVE mg/dL   Nitrite NEGATIVE NEGATIVE   Leukocytes, UA SMALL (A) NEGATIVE   RBC / HPF 6-30 0 - 5 RBC/hpf   WBC, UA 6-30 0 - 5 WBC/hpf   Bacteria, UA RARE (A) NONE SEEN   Squamous Epithelial / LPF 6-30 (A) NONE SEEN  Pregnancy, urine POC     Status: Abnormal   Collection Time: 08/31/16 11:12 AM  Result Value Ref Range   Preg Test, Ur POSITIVE (A) NEGATIVE  Wet prep, genital     Status: Abnormal   Collection Time: 08/31/16 11:50 AM  Result Value Ref Range   Yeast Wet Prep HPF POC NONE SEEN NONE SEEN   Trich, Wet Prep NONE SEEN NONE SEEN   Clue Cells Wet Prep HPF POC PRESENT (A) NONE SEEN   WBC, Wet Prep HPF POC FEW (A) NONE SEEN   Sperm NONE SEEN        IMAGING No results found.  MAU Management/MDM:  Pelvic exam and  Cultures/wet prep done Will check bedside Ultrasound to rule out miscarriage  Consult Dr Jackelyn Knife (message left) with presentation, exam findings, and results.   Treatments in MAU included bedside US  This bleeding/pain can represent a normal pregnancy with bleeding, spontaneous abortion.  The process as listed above helps to determine which of these is present.  Bedside US done:   Single gestational sac, round                                  Yolk sac visible                                  Fetal pole,  measures [redacted]w[redacted]d                                  Fetal heart activity, 160s  ASSESSMENT Single IUP at [redacted]w[redacted]d Antepartum bleeding, first trimester - Plan: Discharge patient    PLAN Discharge home Plan to have her followup in office for routine prenatal followup  Bleeding precautions Pelvic rest Pt stable at time of discharge. Encouraged to return here or to other Urgent Care/ED if she develops worsening of symptoms, increase in pain, fever,  or other concerning symptoms.    Wynelle Bourgeois CNM, MSN Certified Nurse-Midwife 08/31/2016  12:30 PM

## 2016-08-31 NOTE — Progress Notes (Addendum)
G5P4 @ [redacted] wksga. Dx pregnancy March 26 at [facility] office and U/ s done on April 24 and heart beat noted. Presents to triage for leaking fluid since Thursday,and bright red bleeding started this morning requiring to wear pad.  Intercourse last week   1234: Provider at bs.assessing. Speculum exam performed. Bedside U/S. Confirmed FH   1417: discharge instructions given with pt understanding. Pt left unit via ambulatory.

## 2016-09-02 LAB — GC/CHLAMYDIA PROBE AMP (~~LOC~~) NOT AT ARMC
Chlamydia: NEGATIVE
Neisseria Gonorrhea: NEGATIVE

## 2016-09-17 ENCOUNTER — Inpatient Hospital Stay (HOSPITAL_COMMUNITY)
Admission: AD | Admit: 2016-09-17 | Discharge: 2016-09-17 | Disposition: A | Discharge status: Home / Self Care | Payer: Medicaid Other | Source: Ambulatory Visit | Attending: Obstetrics and Gynecology | Admitting: Obstetrics and Gynecology

## 2016-09-17 ENCOUNTER — Encounter (HOSPITAL_COMMUNITY): Payer: Self-pay

## 2016-09-17 DIAGNOSIS — K529 Noninfective gastroenteritis and colitis, unspecified: Secondary | ICD-10-CM | POA: Diagnosis not present

## 2016-09-17 DIAGNOSIS — M545 Low back pain: Secondary | ICD-10-CM | POA: Diagnosis present

## 2016-09-17 DIAGNOSIS — O9989 Other specified diseases and conditions complicating pregnancy, childbirth and the puerperium: Secondary | ICD-10-CM

## 2016-09-17 DIAGNOSIS — Z3A12 12 weeks gestation of pregnancy: Secondary | ICD-10-CM | POA: Diagnosis not present

## 2016-09-17 DIAGNOSIS — R112 Nausea with vomiting, unspecified: Secondary | ICD-10-CM

## 2016-09-17 DIAGNOSIS — O26891 Other specified pregnancy related conditions, first trimester: Secondary | ICD-10-CM | POA: Diagnosis not present

## 2016-09-17 DIAGNOSIS — R197 Diarrhea, unspecified: Secondary | ICD-10-CM

## 2016-09-17 LAB — COMPREHENSIVE METABOLIC PANEL
ALBUMIN: 3 g/dL — AB (ref 3.5–5.0)
ALK PHOS: 51 U/L (ref 38–126)
ALT: 10 U/L — ABNORMAL LOW (ref 14–54)
AST: 15 U/L (ref 15–41)
Anion gap: 6 (ref 5–15)
BUN: 9 mg/dL (ref 6–20)
CO2: 22 mmol/L (ref 22–32)
CREATININE: 0.43 mg/dL — AB (ref 0.44–1.00)
Calcium: 8.5 mg/dL — ABNORMAL LOW (ref 8.9–10.3)
Chloride: 106 mmol/L (ref 101–111)
GFR calc Af Amer: >60 mL/min (ref >60)
GFR calc non Af Amer: >60 mL/min (ref >60)
GLUCOSE: 88 mg/dL (ref 65–99)
Potassium: 4 mmol/L (ref 3.5–5.1)
SODIUM: 134 mmol/L — AB (ref 135–145)
Total Bilirubin: 0.4 mg/dL (ref 0.3–1.2)
Total Protein: 6.4 g/dL — ABNORMAL LOW (ref 6.5–8.1)

## 2016-09-17 LAB — CBC WITH DIFFERENTIAL/PLATELET
BASOS ABS: 0 10*3/uL (ref 0.0–0.1)
Basophils Relative: 0 %
EOS PCT: 1 %
Eosinophils Absolute: 0.1 10*3/uL (ref 0.0–0.7)
HCT: 36.8 % (ref 36.0–46.0)
HEMOGLOBIN: 12.6 g/dL (ref 12.0–15.0)
Lymphocytes Relative: 32 %
Lymphs Abs: 2.4 10*3/uL (ref 0.7–4.0)
MCH: 29.1 pg (ref 26.0–34.0)
MCHC: 34.2 g/dL (ref 30.0–36.0)
MCV: 85 fL (ref 78.0–100.0)
Monocytes Absolute: 0.2 10*3/uL (ref 0.1–1.0)
Monocytes Relative: 3 %
Neutro Abs: 4.9 10*3/uL (ref 1.7–7.7)
Neutrophils Relative %: 64 %
PLATELETS: 252 10*3/uL (ref 150–400)
RBC: 4.33 MIL/uL (ref 3.87–5.11)
RDW: 14.4 % (ref 11.5–15.5)
WBC: 7.6 10*3/uL (ref 4.0–10.5)

## 2016-09-17 LAB — URINALYSIS, ROUTINE W REFLEX MICROSCOPIC
Bilirubin Urine: NEGATIVE
GLUCOSE, UA: NEGATIVE mg/dL
KETONES UR: NEGATIVE mg/dL
Nitrite: NEGATIVE
PH: 6 (ref 5.0–8.0)
Protein, ur: NEGATIVE mg/dL
SPECIFIC GRAVITY, URINE: 1.016 (ref 1.005–1.030)

## 2016-09-17 MED ORDER — LACTATED RINGERS IV SOLN
INTRAVENOUS | Status: DC
  Administered 2016-09-17: 15:00:00 via INTRAVENOUS

## 2016-09-17 MED ORDER — SODIUM CHLORIDE 0.9 % IV SOLN
25.0000 mg | Freq: Once | INTRAVENOUS | Status: AC
  Administered 2016-09-17: 25 mg via INTRAVENOUS
  Filled 2016-09-17: qty 1

## 2016-09-17 MED ORDER — PROMETHAZINE HCL 25 MG PO TABS: 25.0000 mg | ORAL_TABLET | Freq: Four times a day (QID) | ORAL | 2 refills | Status: DC | PRN

## 2016-09-17 NOTE — MAU Note (Signed)
Pt presents to MAU with complaints of nausea, vomiting and diarrhea since May the 10th. Reports lower back pain

## 2016-09-17 NOTE — MAU Provider Note (Signed)
Chief Complaint: Emesis and Diarrhea   First Provider Initiated Contact with Patient 09/17/16 1457        SUBJECTIVE HPI: Carol Welch is a 41 y.o. [id number] at [redacted]w[redacted]d by LMP who presents to maternity admissions reporting nausea, vomiting, and diarrhea for the past 5 days.  Started the day before she returned home from visit to [place].  No one else was sick.  Some chills.  Has not taken anything. . She denies vaginal bleeding, vaginal itching/burning, urinary symptoms, h/a, dizziness, or fever  Emesis   This is a new problem. The current episode started in the past 7 days. The problem occurs 5 to 10 times per day. The problem has been unchanged. There has been no fever. Associated symptoms include chills and diarrhea. Pertinent negatives include no abdominal pain, dizziness, fever, headaches or myalgias. She has tried nothing for the symptoms.  Diarrhea   This is a new problem. The current episode started in the past 7 days. The problem occurs 2 to 4 times per day. The problem has been unchanged. Associated symptoms include chills and vomiting. Pertinent negatives include no abdominal pain, bloating, fever, headaches or myalgias. Nothing aggravates the symptoms. Risk factors include travel to endemic area. She has tried nothing for the symptoms.    RN Note: Pt presents to MAU with complaints of nausea, vomiting and diarrhea since May the 10th. Reports lower back pain  Past Medical History:  Diagnosis Date  . Colitis   . Hypothyroid   . SVD (spontaneous vaginal delivery) 06/30/2015   Past Surgical History:  Procedure Laterality Date  . NO PAST SURGERIES     Social History   Social History  . Marital status: Divorced    Spouse name: N/A  . Number of children: N/A  . Years of education: N/A   Occupational History  . Not on file.   Social History Main Topics  . Smoking status: Never Smoker  . Smokeless tobacco: Never Used  . Alcohol use No  . Drug use: No  . Sexual activity: Yes     Birth control/ protection: None   Other Topics Concern  . Not on file   Social History Narrative  . No narrative on file   No current facility-administered medications on file prior to encounter.    Current Outpatient Prescriptions on File Prior to Encounter  Medication Sig Dispense Refill  . acetaminophen (TYLENOL) 500 MG tablet Take 1,000 mg by mouth every 6 (six) hours as needed for mild pain.    . ferrous sulfate 325 (65 FE) MG tablet Take 325 mg by mouth at bedtime.     Marland Kitchen levothyroxine (SYNTHROID, LEVOTHROID) 100 MCG tablet Take 100 mcg by mouth daily before breakfast.     . Prenatal Vit-Fe Fumarate-FA (PRENATAL MULTIVITAMIN) TABS tablet Take 1 tablet by mouth at bedtime. 100 tablet 3   No Known Allergies  I have reviewed patient's Past Medical Hx, Surgical Hx, Family Hx, Social Hx, medications and allergies.   ROS:  Review of Systems  Constitutional: Positive for chills. Negative for fever.  Gastrointestinal: Positive for diarrhea and vomiting. Negative for abdominal pain and bloating.  Musculoskeletal: Negative for myalgias.  Neurological: Negative for dizziness and headaches.   Review of Systems  Other systems negative   Physical Exam  Physical Exam Patient Vitals for the past 24 hrs:  BP Temp Pulse Resp Height Weight  09/17/16 1444 117/69 98.4 F (36.9 C) 74 18 5\' 3"  (1.6 m) 189 lb (85.7 kg)  Constitutional: Well-developed female in no acute distress.  Cardiovascular: normal rate and rhythm Respiratory: normal effort, no dyspnea GI: Abd soft, non-tender. Pos BS x 4 MS: Extremities nontender, no edema, normal ROM Neurologic: Alert and oriented x 4.  GU: Neg CVAT.  PELVIC EXAM: deferred  FHT 150 by bedside US, unable to hear with Doppler.  12 week fetus, active, regular heartbeat.  LAB RESULTS Results for orders placed or performed during the hospital encounter of 09/17/16 (from the past 24 hour(s))  Urinalysis, Routine w reflex microscopic     Status:  Abnormal   Collection Time: 09/17/16  2:46 PM  Result Value Ref Range   Color, Urine YELLOW YELLOW   APPearance HAZY (A) CLEAR   Specific Gravity, Urine 1.016 1.005 - 1.030   pH 6.0 5.0 - 8.0   Glucose, UA NEGATIVE NEGATIVE mg/dL   Hgb urine dipstick SMALL (A) NEGATIVE   Bilirubin Urine NEGATIVE NEGATIVE   Ketones, ur NEGATIVE NEGATIVE mg/dL   Protein, ur NEGATIVE NEGATIVE mg/dL   Nitrite NEGATIVE NEGATIVE   Leukocytes, UA LARGE (A) NEGATIVE   RBC / HPF 6-30 0 - 5 RBC/hpf   WBC, UA TOO NUMEROUS TO COUNT 0 - 5 WBC/hpf   Bacteria, UA RARE (A) NONE SEEN   Squamous Epithelial / LPF 6-30 (A) NONE SEEN   Mucous PRESENT   CBC with Differential/Platelet     Status: None   Collection Time: 09/17/16  3:20 PM  Result Value Ref Range   WBC 7.6 4.0 - 10.5 K/uL   RBC 4.33 3.87 - 5.11 MIL/uL   Hemoglobin 12.6 12.0 - 15.0 g/dL   HCT 41.336.8 24.436.0 - 01.046.0 %   MCV 85.0 78.0 - 100.0 fL   MCH 29.1 26.0 - 34.0 pg   MCHC 34.2 30.0 - 36.0 g/dL   RDW 27.214.4 53.611.5 - 64.415.5 %   Platelets 252 150 - 400 K/uL   Neutrophils Relative % 64 %   Neutro Abs 4.9 1.7 - 7.7 K/uL   Lymphocytes Relative 32 %   Lymphs Abs 2.4 0.7 - 4.0 K/uL   Monocytes Relative 3 %   Monocytes Absolute 0.2 0.1 - 1.0 K/uL   Eosinophils Relative 1 %   Eosinophils Absolute 0.1 0.0 - 0.7 K/uL   Basophils Relative 0 %   Basophils Absolute 0.0 0.0 - 0.1 K/uL  Comprehensive metabolic panel     Status: Abnormal   Collection Time: 09/17/16  3:20 PM  Result Value Ref Range   Sodium 134 (L) 135 - 145 mmol/L   Potassium 4.0 3.5 - 5.1 mmol/L   Chloride 106 101 - 111 mmol/L   CO2 22 22 - 32 mmol/L   Glucose, Bld 88 65 - 99 mg/dL   BUN 9 6 - 20 mg/dL   Creatinine, Ser 0.340.43 (L) 0.44 - 1.00 mg/dL   Calcium 8.5 (L) 8.9 - 10.3 mg/dL   Total Protein 6.4 (L) 6.5 - 8.1 g/dL   Albumin 3.0 (L) 3.5 - 5.0 g/dL   AST 15 15 - 41 U/L   ALT 10 (L) 14 - 54 U/L   Alkaline Phosphatase 51 38 - 126 U/L   Total Bilirubin 0.4 0.3 - 1.2 mg/dL   GFR calc non Af  Amer >60 >60 mL/min   GFR calc Af Amer >60 >60 mL/min   Anion gap 6 5 - 15       IMAGING No results found.  MAU Management/MDM: Ordered labs to rule out leukocytosis or hypokalemia. These were normal  UA does not indicated significant dehydration. IV started for rehydration prior to UA result Phenergan given also, in second liter No vomiting while here Only one small diarrhea stool.  Consult Dr Jackelyn Knife with presentation, exam findings, and results.   Treatments in MAU included See above.   At this time, I do not feel there is any life-threatening condition present. I have reviewed and discussed all results, exam findings with patient. I have reviewed nursing notes and appropriate previous records.  I feel the patient is safe to be discharged home without further emergent workup. Discussed usual and customary return precautions. Patient and  verbalize understanding and are comfortable with this plan.  Patient will follow-up with their OB provider  All questions have been answered   ASSESSMENT Single IUP at [redacted]w[redacted]d Gastroenteritis, unclear if viral or food poisoning/parasite  PLAN Discharge home Advance diet as tolerated If diarrhea persists, may need to send stool for O&P, did not collect sample when she went (I had given her container, but she did not collect) Rx Phenergan for nausea at home  Pt stable at time of discharge. Encouraged to return here or to other Urgent Care/ED if she develops worsening of symptoms, increase in pain, fever, or other concerning symptoms.    Wynelle Bourgeois CNM, MSN Certified Nurse-Midwife 09/17/2016  5:13 PM

## 2016-09-17 NOTE — Discharge Instructions (Signed)

## 2016-12-04 ENCOUNTER — Observation Stay (HOSPITAL_COMMUNITY)
Admission: AD | Admit: 2016-12-04 | Discharge: 2016-12-05 | Disposition: A | Discharge status: Home / Self Care | Payer: No Typology Code available for payment source | Attending: Obstetrics and Gynecology | Admitting: Obstetrics and Gynecology

## 2016-12-04 ENCOUNTER — Encounter (HOSPITAL_COMMUNITY): Payer: Self-pay | Admitting: Emergency Medicine

## 2016-12-04 DIAGNOSIS — Z809 Family history of malignant neoplasm, unspecified: Secondary | ICD-10-CM | POA: Diagnosis not present

## 2016-12-04 DIAGNOSIS — Z3A23 23 weeks gestation of pregnancy: Secondary | ICD-10-CM | POA: Insufficient documentation

## 2016-12-04 DIAGNOSIS — O99282 Endocrine, nutritional and metabolic diseases complicating pregnancy, second trimester: Secondary | ICD-10-CM | POA: Insufficient documentation

## 2016-12-04 DIAGNOSIS — Z79899 Other long term (current) drug therapy: Secondary | ICD-10-CM | POA: Insufficient documentation

## 2016-12-04 DIAGNOSIS — O9A212 Injury, poisoning and certain other consequences of external causes complicating pregnancy, second trimester: Principal | ICD-10-CM | POA: Insufficient documentation

## 2016-12-04 DIAGNOSIS — R109 Unspecified abdominal pain: Secondary | ICD-10-CM | POA: Insufficient documentation

## 2016-12-04 DIAGNOSIS — E039 Hypothyroidism, unspecified: Secondary | ICD-10-CM | POA: Insufficient documentation

## 2016-12-04 LAB — TYPE AND SCREEN
ABO/RH(D): A POS
ANTIBODY SCREEN: NEGATIVE

## 2016-12-04 MED ORDER — DOCUSATE SODIUM 100 MG PO CAPS
100.0000 mg | ORAL_CAPSULE | Freq: Every day | ORAL | Status: DC
  Filled 2016-12-04: qty 1

## 2016-12-04 MED ORDER — ACETAMINOPHEN 325 MG PO TABS: 650.0000 mg | ORAL_TABLET | ORAL | Status: DC | PRN

## 2016-12-04 MED ORDER — PRENATAL MULTIVITAMIN CH: 1.0000 | ORAL_TABLET | Freq: Every day | ORAL | Status: DC

## 2016-12-04 MED ORDER — ZOLPIDEM TARTRATE 5 MG PO TABS: 5.0000 mg | ORAL_TABLET | Freq: Every evening | ORAL | Status: DC | PRN

## 2016-12-04 MED ORDER — LACTATED RINGERS IV SOLN
INTRAVENOUS | Status: DC
  Administered 2016-12-05: 07:00:00 via INTRAVENOUS

## 2016-12-04 MED ORDER — LACTATED RINGERS IV BOLUS (SEPSIS)
500.0000 mL | Freq: Once | INTRAVENOUS | Status: AC
  Administered 2016-12-04: 500 mL via INTRAVENOUS

## 2016-12-04 MED ORDER — BUTORPHANOL TARTRATE 1 MG/ML IJ SOLN
1.0000 mg | INTRAMUSCULAR | Status: DC | PRN
  Administered 2016-12-04: 1 mg via INTRAVENOUS
  Filled 2016-12-04: qty 1

## 2016-12-04 MED ORDER — CALCIUM CARBONATE ANTACID 500 MG PO CHEW: 2.0000 | CHEWABLE_TABLET | ORAL | Status: DC | PRN

## 2016-12-04 NOTE — Progress Notes (Signed)
Orthopedic Tech Progress Note Patient Details:  Carol Welch August 10, 1975 [id number]  Patient ID: Carol AliasAzucena Krichbaum, female   DOB: August 10, 1975, 41 y.o.   MRN: [id number]   Nikki DomCrawford, Henryk Ursin 12/04/2016, 4:30 PM Made level 2 trauma visit
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
## 2016-12-04 NOTE — Progress Notes (Signed)
Caitlyn charge nurse on [facility] called and given report.  [facility]
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
## 2016-12-05 NOTE — Discharge Instructions (Signed)
Call for any problems, keep next scheduled appointment
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
## 2016-12-10 ENCOUNTER — Ambulatory Visit (INDEPENDENT_AMBULATORY_CARE_PROVIDER_SITE_OTHER): Payer: Self-pay | Admitting: Orthopaedic Surgery
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
## 2017-01-08 ENCOUNTER — Encounter (HOSPITAL_COMMUNITY): Payer: Self-pay | Admitting: *Deleted

## 2017-01-08 ENCOUNTER — Inpatient Hospital Stay (HOSPITAL_COMMUNITY)
Admission: AD | Admit: 2017-01-08 | Discharge: 2017-01-08 | Disposition: A | Discharge status: Home / Self Care | Payer: Medicaid Other | Source: Ambulatory Visit | Attending: Obstetrics and Gynecology | Admitting: Obstetrics and Gynecology

## 2017-01-08 DIAGNOSIS — Z3A27 27 weeks gestation of pregnancy: Secondary | ICD-10-CM | POA: Diagnosis not present

## 2017-01-08 DIAGNOSIS — Z8249 Family history of ischemic heart disease and other diseases of the circulatory system: Secondary | ICD-10-CM | POA: Insufficient documentation

## 2017-01-08 DIAGNOSIS — O99282 Endocrine, nutritional and metabolic diseases complicating pregnancy, second trimester: Secondary | ICD-10-CM | POA: Diagnosis not present

## 2017-01-08 DIAGNOSIS — E039 Hypothyroidism, unspecified: Secondary | ICD-10-CM | POA: Diagnosis not present

## 2017-01-08 DIAGNOSIS — Z0371 Encounter for suspected problem with amniotic cavity and membrane ruled out: Secondary | ICD-10-CM

## 2017-01-08 DIAGNOSIS — Z809 Family history of malignant neoplasm, unspecified: Secondary | ICD-10-CM | POA: Insufficient documentation

## 2017-01-08 DIAGNOSIS — Z79899 Other long term (current) drug therapy: Secondary | ICD-10-CM | POA: Diagnosis not present

## 2017-01-08 LAB — AMNISURE RUPTURE OF MEMBRANE (ROM) NOT AT ARMC: AMNISURE: NEGATIVE

## 2017-01-08 LAB — URINALYSIS, ROUTINE W REFLEX MICROSCOPIC
Bilirubin Urine: NEGATIVE
Glucose, UA: NEGATIVE mg/dL
Hgb urine dipstick: NEGATIVE
Ketones, ur: NEGATIVE mg/dL
Nitrite: NEGATIVE
PROTEIN: NEGATIVE mg/dL
Specific Gravity, Urine: 1.019 (ref 1.005–1.030)
pH: 6 (ref 5.0–8.0)

## 2017-01-08 MED ORDER — IBUPROFEN 600 MG PO TABS
600.0000 mg | ORAL_TABLET | Freq: Once | ORAL | Status: AC
  Administered 2017-01-08: 600 mg via ORAL
  Filled 2017-01-08: qty 1

## 2017-01-08 NOTE — Discharge Instructions (Signed)
Third Trimester of Pregnancy The third trimester is from week 28 through week 40 (months 7 through 9). The third trimester is a time when the unborn baby (fetus) is growing rapidly. At the end of the ninth month, the fetus is about 20 inches in length and weighs 6-10 pounds. Body changes during your third trimester Your body will continue to go through many changes during pregnancy. The changes vary from woman to woman. During the third trimester:  Your weight will continue to increase. You can expect to gain 25-35 pounds (11-16 kg) by the end of the pregnancy.  You may begin to get stretch marks on your hips, abdomen, and breasts.  You may urinate more often because the fetus is moving lower into your pelvis and pressing on your bladder.  You may develop or continue to have heartburn. This is caused by increased hormones that slow down muscles in the digestive tract.  You may develop or continue to have constipation because increased hormones slow digestion and cause the muscles that push waste through your intestines to relax.  You may develop hemorrhoids. These are swollen veins (varicose veins) in the rectum that can itch or be painful.  You may develop swollen, bulging veins (varicose veins) in your legs.  You may have increased body aches in the pelvis, back, or thighs. This is due to weight gain and increased hormones that are relaxing your joints.  You may have changes in your hair. These can include thickening of your hair, rapid growth, and changes in texture. Some women also have hair loss during or after pregnancy, or hair that feels dry or thin. Your hair will most likely return to normal after your baby is born.  Your breasts will continue to grow and they will continue to become tender. A yellow fluid (colostrum) may leak from your breasts. This is the first milk you are producing for your baby.  Your belly button may stick out.  You may notice more swelling in your hands,  face, or ankles.  You may have increased tingling or numbness in your hands, arms, and legs. The skin on your belly may also feel numb.  You may feel short of breath because of your expanding uterus.  You may have more problems sleeping. This can be caused by the size of your belly, increased need to urinate, and an increase in your body's metabolism.  You may notice the fetus "dropping," or moving lower in your abdomen (lightening).  You may have increased vaginal discharge.  You may notice your joints feel loose and you may have pain around your pelvic bone.  What to expect at prenatal visits You will have prenatal exams every 2 weeks until week 36. Then you will have weekly prenatal exams. During a routine prenatal visit:  You will be weighed to make sure you and the baby are growing normally.  Your blood pressure will be taken.  Your abdomen will be measured to track your baby's growth.  The fetal heartbeat will be listened to.  Any test results from the previous visit will be discussed.  You may have a cervical check near your due date to see if your cervix has softened or thinned (effaced).  You will be tested for Group B streptococcus. This happens between 35 and 37 weeks.  Your health care provider may ask you:  What your birth plan is.  How you are feeling.  If you are feeling the baby move.  If you have had   any abnormal symptoms, such as leaking fluid, bleeding, severe headaches, or abdominal cramping.  If you are using any tobacco products, including cigarettes, chewing tobacco, and electronic cigarettes.  If you have any questions.  Other tests or screenings that may be performed during your third trimester include:  Blood tests that check for low iron levels (anemia).  Fetal testing to check the health, activity level, and growth of the fetus. Testing is done if you have certain medical conditions or if there are problems during the  pregnancy.  Nonstress test (NST). This test checks the health of your baby to make sure there are no signs of problems, such as the baby not getting enough oxygen. During this test, a belt is placed around your belly. The baby is made to move, and its heart rate is monitored during movement.  What is false labor? False labor is a condition in which you feel small, irregular tightenings of the muscles in the womb (contractions) that usually go away with rest, changing position, or drinking water. These are called Braxton Hicks contractions. Contractions may last for hours, days, or even weeks before true labor sets in. If contractions come at regular intervals, become more frequent, increase in intensity, or become painful, you should see your health care provider. What are the signs of labor?  Abdominal cramps.  Regular contractions that start at 10 minutes apart and become stronger and more frequent with time.  Contractions that start on the top of the uterus and spread down to the lower abdomen and back.  Increased pelvic pressure and dull back pain.  A watery or bloody mucus discharge that comes from the vagina.  Leaking of amniotic fluid. This is also known as your "water breaking." It could be a slow trickle or a gush. Let your health care provider know if it has a color or strange odor. If you have any of these signs, call your health care provider right away, even if it is before your due date. Follow these instructions at home: Medicines  Follow your health care provider's instructions regarding medicine use. Specific medicines may be either safe or unsafe to take during pregnancy.  Take a prenatal vitamin that contains at least 600 micrograms (mcg) of folic acid.  If you develop constipation, try taking a stool softener if your health care provider approves. Eating and drinking  Eat a balanced diet that includes fresh fruits and vegetables, whole grains, good sources of protein  such as meat, eggs, or tofu, and low-fat dairy. Your health care provider will help you determine the amount of weight gain that is right for you.  Avoid raw meat and uncooked cheese. These carry germs that can cause birth defects in the baby.  If you have low calcium intake from food, talk to your health care provider about whether you should take a daily calcium supplement.  Eat four or five small meals rather than three large meals a day.  Limit foods that are high in fat and processed sugars, such as fried and sweet foods.  To prevent constipation: ? Drink enough fluid to keep your urine clear or pale yellow. ? Eat foods that are high in fiber, such as fresh fruits and vegetables, whole grains, and beans. Activity  Exercise only as directed by your health care provider. Most women can continue their usual exercise routine during pregnancy. Try to exercise for 30 minutes at least 5 days a week. Stop exercising if you experience uterine contractions.  Avoid heavy   lifting.  Do not exercise in extreme heat or humidity, or at high altitudes.  Wear low-heel, comfortable shoes.  Practice good posture.  You may continue to have sex unless your health care provider tells you otherwise. Relieving pain and discomfort  Take frequent breaks and rest with your legs elevated if you have leg cramps or low back pain.  Take warm sitz baths to soothe any pain or discomfort caused by hemorrhoids. Use hemorrhoid cream if your health care provider approves.  Wear a good support bra to prevent discomfort from breast tenderness.  If you develop varicose veins: ? Wear support pantyhose or compression stockings as told by your healthcare provider. ? Elevate your feet for 15 minutes, 3-4 times a day. Prenatal care  Write down your questions. Take them to your prenatal visits.  Keep all your prenatal visits as told by your health care provider. This is important. Safety  Wear your seat belt at  all times when driving.  Make a list of emergency phone numbers, including numbers for family, friends, the hospital, and police and fire departments. General instructions  Avoid cat litter boxes and soil used by cats. These carry germs that can cause birth defects in the baby. If you have a cat, ask someone to clean the litter box for you.  Do not travel far distances unless it is absolutely necessary and only with the approval of your health care provider.  Do not use hot tubs, steam rooms, or saunas.  Do not drink alcohol.  Do not use any products that contain nicotine or tobacco, such as cigarettes and e-cigarettes. If you need help quitting, ask your health care provider.  Do not use any medicinal herbs or unprescribed drugs. These chemicals affect the formation and growth of the baby.  Do not douche or use tampons or scented sanitary pads.  Do not cross your legs for long periods of time.  To prepare for the arrival of your baby: ? Take prenatal classes to understand, practice, and ask questions about labor and delivery. ? Make a trial run to the hospital. ? Visit the hospital and tour the maternity area. ? Arrange for maternity or paternity leave through employers. ? Arrange for family and friends to take care of pets while you are in the hospital. ? Purchase a rear-facing car seat and make sure you know how to install it in your car. ? Pack your hospital bag. ? Prepare the baby's nursery. Make sure to remove all pillows and stuffed animals from the baby's crib to prevent suffocation.  Visit your dentist if you have not gone during your pregnancy. Use a soft toothbrush to brush your teeth and be gentle when you floss. Contact a health care provider if:  You are unsure if you are in labor or if your water has broken.  You become dizzy.  You have mild pelvic cramps, pelvic pressure, or nagging pain in your abdominal area.  You have lower back pain.  You have persistent  nausea, vomiting, or diarrhea.  You have an unusual or bad smelling vaginal discharge.  You have pain when you urinate. Get help right away if:  Your water breaks before 37 weeks.  You have regular contractions less than 5 minutes apart before 37 weeks.  You have a fever.  You are leaking fluid from your vagina.  You have spotting or bleeding from your vagina.  You have severe abdominal pain or cramping.  You have rapid weight loss or weight gain.    You have shortness of breath with chest pain.  You notice sudden or extreme swelling of your face, hands, ankles, feet, or legs.  Your baby makes fewer than 10 movements in 2 hours.  You have severe headaches that do not go away when you take medicine.  You have vision changes. Summary  The third trimester is from week 28 through week 40, months 7 through 9. The third trimester is a time when the unborn baby (fetus) is growing rapidly.  During the third trimester, your discomfort may increase as you and your baby continue to gain weight. You may have abdominal, leg, and back pain, sleeping problems, and an increased need to urinate.  During the third trimester your breasts will keep growing and they will continue to become tender. A yellow fluid (colostrum) may leak from your breasts. This is the first milk you are producing for your baby.  False labor is a condition in which you feel small, irregular tightenings of the muscles in the womb (contractions) that eventually go away. These are called Braxton Hicks contractions. Contractions may last for hours, days, or even weeks before true labor sets in.  Signs of labor can include: abdominal cramps; regular contractions that start at 10 minutes apart and become stronger and more frequent with time; watery or bloody mucus discharge that comes from the vagina; increased pelvic pressure and dull back pain; and leaking of amniotic fluid. This information is not intended to replace advice  given to you by your health care provider. Make sure you discuss any questions you have with your health care provider. Document Released: 04/16/2001 Document Revised: 09/28/2015 Document Reviewed: 06/23/2012 Elsevier Interactive Patient Education  2017 [employer]  

## 2017-01-08 NOTE — MAU Provider Note (Signed)
History     CSN: [id number]  Arrival date and time: 01/08/17 1211   First Provider Initiated Contact with Patient 01/08/17 1411      Chief Complaint  Patient presents with  . Rupture of Membranes   HPI   [name] is a 41 y.o. female [phone] @ 3943w2d here in MAU with leaking of fluid. She noticed a couple episodes of leaking of fluid yesterday and today. States that her underwear were wet. She was seen in the office today and sent here for further evaluation. Denies vaginal bleeding. + fetal movement.   OB History    Gravida Para Term Preterm AB Living   6 4 4  0 1 4   SAB TAB Ectopic Multiple Live Births   1 0 0 0 1      Past Medical History:  Diagnosis Date  . Colitis   . Hypothyroid   . SVD (spontaneous vaginal delivery) 06/30/2015    Past Surgical History:  Procedure Laterality Date  . NO PAST SURGERIES      Family History  Problem Relation Age of Onset  . Heart disease Father   . Cancer Maternal Aunt   . Cancer Maternal Grandmother   . Heart disease Paternal Grandfather     Social History  Substance Use Topics  . Smoking status: Never Smoker  . Smokeless tobacco: Never Used  . Alcohol use No    Allergies: No Known Allergies  Prescriptions Prior to Admission  Medication Sig Dispense Refill Last Dose  . acetaminophen (TYLENOL) 500 MG tablet Take 1,000 mg by mouth every 6 (six) hours as needed for mild pain, moderate pain, fever or headache.    01/07/2017 at 2000  . ferrous sulfate 325 (65 FE) MG tablet Take 325 mg by mouth at bedtime.    Past Week at Unknown time  . levothyroxine (SYNTHROID, LEVOTHROID) 100 MCG tablet Take 100 mcg by mouth daily before breakfast.    01/08/2017 at Unknown time  . Prenatal Vit-Fe Fumarate-FA (PRENATAL MULTIVITAMIN) TABS tablet Take 1 tablet by mouth at bedtime. 100 tablet 3 01/07/2017 at Unknown time   Results for orders placed or performed during the hospital encounter of 01/08/17 (from the past 48 hour(s))  Urinalysis,  Routine w reflex microscopic     Status: Abnormal   Collection Time: 01/08/17 12:31 PM  Result Value Ref Range   Color, Urine YELLOW YELLOW   APPearance HAZY (A) CLEAR   Specific Gravity, Urine 1.019 1.005 - 1.030   pH 6.0 5.0 - 8.0   Glucose, UA NEGATIVE NEGATIVE mg/dL   Hgb urine dipstick NEGATIVE NEGATIVE   Bilirubin Urine NEGATIVE NEGATIVE   Ketones, ur NEGATIVE NEGATIVE mg/dL   Protein, ur NEGATIVE NEGATIVE mg/dL   Nitrite NEGATIVE NEGATIVE   Leukocytes, UA SMALL (A) NEGATIVE   RBC / HPF 0-5 0 - 5 RBC/hpf   WBC, UA 0-5 0 - 5 WBC/hpf   Bacteria, UA RARE (A) NONE SEEN   Squamous Epithelial / LPF 6-30 (A) NONE SEEN   Mucus PRESENT   Amnisure rupture of membrane (rom[facility]     Status: None   Collection Time: 01/08/17  1:05 PM  Result Value Ref Range   Amnisure ROM NEGATIVE     Review of Systems  Gastrointestinal: Positive for abdominal pain.  Genitourinary: Positive for vaginal discharge. Negative for decreased urine volume and vaginal bleeding.   Physical Exam   Blood pressure (!) 99/54, pulse 82, temperature 97.9 F (36.6 C), temperature source Oral,  resp. rate 18, height 5\' 2"  (1.575 m), weight 89.8 kg (198 lb 0.4 oz), last menstrual period 06/23/2016, unknown if currently breastfeeding.  Physical Exam  Constitutional: She is oriented to person, place, and time. She appears well-developed and well-nourished. No distress.  HENT:  Head: Normocephalic.  GI: Soft. She exhibits no distension. There is no tenderness.  Genitourinary:  Genitourinary Comments: Cervix: closed, anterior   Neurological: She is alert and oriented to person, place, and time.  Skin: Skin is warm. She is not diaphoretic.  Psychiatric: Her behavior is normal.   Fetal Tracing: Baseline: 140 bpm Variability: moderate  Accelerations: 10x10 Decelerations: none Toco: quiet    MAU Course  Procedures  None  MDM  Amnisure negative  Patient requesting tylenol/ ibuprofen prior to leaving,  ibuprofen given 600 mg PO  Assessment and Plan   A:  1. Encounter for suspected premature rupture of amniotic membranes, with rupture of membranes not found     P:  Discharge home in stable condition Return to MAU if symptoms worsen Follow up with OB  Kenai Fluegel, Harolyn Rutherford, NP  01/08/2017 4:58 PM

## 2017-01-08 NOTE — MAU Note (Signed)
Felt soaking wetness yesterday x2 episodes.  No bleeding or odor to discharge.  Clear like water.  Lower abd. Cramping after first leaking episode yesterday.  Went to office and MD stated she was positive for leaking membranes.

## 2017-02-10 ENCOUNTER — Ambulatory Visit: Payer: Medicaid Other | Admitting: Physical Therapy

## 2017-02-12 ENCOUNTER — Ambulatory Visit: Payer: Medicaid Other | Attending: Physical Therapy | Admitting: Physical Therapy

## 2017-02-19 ENCOUNTER — Encounter: Payer: Self-pay | Admitting: General Practice

## 2017-02-19 ENCOUNTER — Ambulatory Visit (INDEPENDENT_AMBULATORY_CARE_PROVIDER_SITE_OTHER): Payer: Medicaid Other | Admitting: General Practice

## 2017-02-19 VITALS — BP 104/55 | HR 68 | Ht 62.0 in | Wt 201.0 lb

## 2017-02-19 DIAGNOSIS — O479 False labor, unspecified: Secondary | ICD-10-CM

## 2017-02-19 DIAGNOSIS — O4700 False labor before 37 completed weeks of gestation, unspecified trimester: Secondary | ICD-10-CM

## 2017-02-19 MED ORDER — BETAMETHASONE SOD PHOS & ACET 6 (3-3) MG/ML IJ SUSP
12.0000 mg | Freq: Once | INTRAMUSCULAR | Status: AC
  Administered 2017-02-19: 12 mg via INTRAMUSCULAR

## 2017-02-20 ENCOUNTER — Ambulatory Visit: Payer: Medicaid Other

## 2017-03-10 ENCOUNTER — Ambulatory Visit (INDEPENDENT_AMBULATORY_CARE_PROVIDER_SITE_OTHER): Payer: Medicaid Other | Admitting: Vascular Surgery

## 2017-03-10 ENCOUNTER — Other Ambulatory Visit: Payer: Self-pay

## 2017-03-10 ENCOUNTER — Encounter: Payer: Self-pay | Admitting: Vascular Surgery

## 2017-03-10 VITALS — BP 90/56 | HR 100 | Temp 98.2°F | Resp 14 | Ht 62.0 in | Wt 207.0 lb

## 2017-03-10 DIAGNOSIS — I868 Varicose veins of other specified sites: Secondary | ICD-10-CM

## 2017-03-10 DIAGNOSIS — I83893 Varicose veins of bilateral lower extremities with other complications: Secondary | ICD-10-CM

## 2017-03-10 NOTE — Progress Notes (Signed)
Subjective:     Patient ID: Carol Welch, female   DOB: 1976-05-05, 41 y.o.   MRN: [id number]  HPI This 41 year old female was referred by Dr. Doristine Counterecelia Banga for evaluation of bilateral painful varicose veins. She has had no previous treatment. She has been experiencing pain and swelling in both legs right worse than left particularly over the past 2 years. The symptoms continue to worsen. She develops aching throbbing and burning discomfort particularly in the calf areas. She has tried short leg elastic compression stockings with no improvement. She has no history of DVT thrombophlebitis stasis ulcers or bleeding. Her symptoms are affecting her daily living and continuing to worsen.  Past Medical History:  Diagnosis Date  . Colitis   . Hypothyroid   . SVD (spontaneous vaginal delivery) 06/30/2015    Social History   Tobacco Use  . Smoking status: Never Smoker  . Smokeless tobacco: Never Used  Substance Use Topics  . Alcohol use: No    Family History  Problem Relation Age of Onset  . Heart disease Father   . Cancer Maternal Aunt   . Cancer Maternal Grandmother   . Heart disease Paternal Grandfather     No Known Allergies   Current Outpatient Medications:  .  acetaminophen (TYLENOL) 500 MG tablet, Take 1,000 mg by mouth every 6 (six) hours as needed for mild pain, moderate pain, fever or headache. , Disp: , Rfl:  .  levothyroxine (SYNTHROID, LEVOTHROID) 100 MCG tablet, Take 100 mcg by mouth daily before breakfast. , Disp: , Rfl:  .  Prenatal Vit-Fe Fumarate-FA (PRENATAL MULTIVITAMIN) TABS tablet, Take 1 tablet by mouth at bedtime., Disp: 100 tablet, Rfl: 3  Vitals:   03/10/17 1105  BP: (!) 90/56  Pulse: 100  Resp: 14  Temp: 98.2 F (36.8 C)  SpO2: 98%  Weight: 207 lb (93.9 kg)  Height: 5\' 2"  (1.575 m)    Body mass index is 37.86 kg/m.         Review of Systems Denies chest pain, dyspnea on exertion, PND, orthopnea, hemoptysis, claudication    Objective:   Physical Exam BP (!) 90/56 (BP Location: Left Arm, Patient Position: Sitting, Cuff Size: Large)   Pulse 100   Temp 98.2 F (36.8 C)   Resp 14   Ht 5\' 2"  (1.575 m)   Wt 207 lb (93.9 kg)   LMP 06/23/2016 (Exact Date)   SpO2 98%   BMI 37.86 kg/m     Gen.-alert and oriented x3 in no apparent distress HEENT normal for age Lungs no rhonchi or wheezing Cardiovascular regular rhythm no murmurs carotid pulses 3+ palpable no bruits audible Abdomen soft nontender no palpable masses-obese Musculoskeletal free of  major deformities Skin clear -no rashes Neurologic normal Lower extremities 3+ femoral and dorsalis pedis pulses palpable bilaterally with trace edema bilaterally right worse than left Bulging varicosities in the right leg the posterior calf with extensive reticular and spider veins in the posterior calf medial calf area. No hyperpigmentation or ulceration noted. Left leg with varicosities in the distal medial thigh and reticular veins medial calf. No hyperpigmentation or ulceration noted.  Today I performed a bedside SonoSite ultrasound exam bilaterally. 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

## 2017-03-11 LAB — OB RESULTS CONSOLE GBS: GBS: POSITIVE

## 2017-03-11 NOTE — Addendum Note (Signed)
Addended by: Burton ApleyPETTY, Kendelle Schweers A on: 03/11/2017 10:11 AM   Modules accepted: Orders

## 2017-03-15 ENCOUNTER — Other Ambulatory Visit: Payer: Self-pay

## 2017-03-15 ENCOUNTER — Inpatient Hospital Stay (HOSPITAL_COMMUNITY)
Admission: AD | Admit: 2017-03-15 | Discharge: 2017-03-15 | Disposition: A | Discharge status: Home / Self Care | Payer: Medicaid Other | Source: Ambulatory Visit | Attending: Obstetrics and Gynecology | Admitting: Obstetrics and Gynecology

## 2017-03-15 ENCOUNTER — Encounter (HOSPITAL_COMMUNITY): Payer: Self-pay

## 2017-03-15 DIAGNOSIS — O4703 False labor before 37 completed weeks of gestation, third trimester: Secondary | ICD-10-CM

## 2017-03-15 DIAGNOSIS — Z3A36 36 weeks gestation of pregnancy: Secondary | ICD-10-CM | POA: Insufficient documentation

## 2017-03-15 LAB — URINALYSIS, ROUTINE W REFLEX MICROSCOPIC
Bacteria, UA: NONE SEEN
Bilirubin Urine: NEGATIVE
Glucose, UA: NEGATIVE mg/dL
Hgb urine dipstick: NEGATIVE
Ketones, ur: NEGATIVE mg/dL
Nitrite: NEGATIVE
Protein, ur: NEGATIVE mg/dL
Specific Gravity, Urine: 1.002 — ABNORMAL LOW (ref 1.005–1.030)
pH: 7 (ref 5.0–8.0)

## 2017-03-15 NOTE — Progress Notes (Addendum)
G6P4 @ 36.[redacted] wksga. Presents to triage for r/o labor. States been ctx strong since 1400 today. Been taking procardia q 6 hrs for past 3 weeks. Denies bleeding but unsure of rupture. + FM   SVE closed.    Fern test done due to pt unsure of ROM. Negative  1814: Provider notified. Report status of pt given. Orders received to discharge pt home.   1827: Discharge instructions given with pt understanding. Pt left unit via ambulatory.

## 2017-03-15 NOTE — MAU Note (Signed)
Pt presents to MAU with complaints of contractions that started at 2 today. Denies any vaginal bleeding or LOF

## 2017-03-16 NOTE — Progress Notes (Signed)
FHT from 11-10 reviewed.  Reactive NST with baseline 140s, irregular ctx

## 2017-03-20 ENCOUNTER — Inpatient Hospital Stay (HOSPITAL_COMMUNITY)
Admission: AD | Admit: 2017-03-20 | Discharge: 2017-03-20 | Disposition: A | Discharge status: Home / Self Care | Payer: Medicaid Other | Source: Ambulatory Visit | Attending: Obstetrics and Gynecology | Admitting: Obstetrics and Gynecology

## 2017-03-20 ENCOUNTER — Encounter (HOSPITAL_COMMUNITY): Payer: Self-pay | Admitting: *Deleted

## 2017-03-20 DIAGNOSIS — Z029 Encounter for administrative examinations, unspecified: Secondary | ICD-10-CM | POA: Insufficient documentation

## 2017-03-20 DIAGNOSIS — O471 False labor at or after 37 completed weeks of gestation: Secondary | ICD-10-CM

## 2017-03-20 NOTE — MAU Note (Signed)
I have communicated with Dr Jackelyn KnifeMeisinger and reviewed vital signs:  Vitals:   03/20/17 1835 03/20/17 2109  BP: 119/67 (!) 106/59  Pulse: 74 94  Resp: 16 16  Temp: 98.1 F (36.7 C) 98 F (36.7 C)  SpO2: 99%     Vaginal exam:  Dilation: 3 Effacement (%): 50 Cervical Position: Middle Station: -2 Presentation: Vertex Exam by:: T Velia MeyerLytle RN,   Also reviewed contraction pattern and that non-stress test is reactive.  It has been documented that patient is contracting every 2-3 minutes with no cervical change over 2  hours not indicating active labor.  Patient denies any other complaints.  Based on this report provider has given order for discharge.  A discharge order and diagnosis entered by a provider.   Labor discharge instructions reviewed with patient.

## 2017-03-20 NOTE — Discharge Instructions (Signed)

## 2017-03-20 NOTE — Progress Notes (Signed)
Per Dr Jackelyn KnifeMeisinger ok for pt to walk for an hour @ 1915.

## 2017-03-20 NOTE — MAU Note (Signed)
Pt reports contractions today, SVE today in office 3cm. Dr instructed pt to walk for an hour and come here for recheck.

## 2017-03-23 ENCOUNTER — Inpatient Hospital Stay (HOSPITAL_COMMUNITY)
Admission: AD | Admit: 2017-03-23 | Discharge: 2017-03-23 | Disposition: A | Discharge status: Home / Self Care | Payer: Medicaid Other | Source: Ambulatory Visit | Attending: Obstetrics and Gynecology | Admitting: Obstetrics and Gynecology

## 2017-03-23 ENCOUNTER — Other Ambulatory Visit: Payer: Self-pay

## 2017-03-23 ENCOUNTER — Encounter (HOSPITAL_COMMUNITY): Payer: Self-pay

## 2017-03-23 DIAGNOSIS — O471 False labor at or after 37 completed weeks of gestation: Secondary | ICD-10-CM

## 2017-03-23 NOTE — MAU Note (Signed)
Contractions started 2 hrs, now every 5 ming.  Mucous d/c this morning, has some blood in it now.  3 cm when last checked.

## 2017-03-23 NOTE — Discharge Instructions (Signed)

## 2017-03-23 NOTE — MAU Note (Signed)
I have communicated with Dr. Ellyn HackBovard and reviewed vital signs:  Vitals:   03/23/17 1502 03/23/17 1700  BP: 116/66 111/64  Pulse: 65   Resp: 18   Temp: 98.5 F (36.9 C)   SpO2: 100%     Vaginal exam:  Dilation: 3 Effacement (%): 80 Station: -2 Presentation: Vertex Exam by:: n Sincerity Cedar rn,   Also reviewed contraction pattern and that non-stress test is reactive.  It has been documented that patient is contracting every 2-4 minutes with no cervical change over 1.5 hours not indicating active labor.  Patient denies any other complaints.  Based on this report provider has given order for discharge.  A discharge order and diagnosis entered by a provider.   Labor discharge instructions reviewed with patient.

## 2017-03-24 ENCOUNTER — Inpatient Hospital Stay (HOSPITAL_COMMUNITY): Payer: Medicaid Other | Admitting: Anesthesiology

## 2017-03-24 ENCOUNTER — Inpatient Hospital Stay (HOSPITAL_COMMUNITY)
Admission: AD | Admit: 2017-03-24 | Discharge: 2017-03-26 | DRG: 807 | Disposition: A | Discharge status: Home / Self Care | Payer: Medicaid Other | Source: Ambulatory Visit | Attending: Obstetrics and Gynecology | Admitting: Obstetrics and Gynecology

## 2017-03-24 ENCOUNTER — Encounter (HOSPITAL_COMMUNITY): Payer: Self-pay | Admitting: *Deleted

## 2017-03-24 DIAGNOSIS — Z3A38 38 weeks gestation of pregnancy: Secondary | ICD-10-CM

## 2017-03-24 DIAGNOSIS — O99824 Streptococcus B carrier state complicating childbirth: Secondary | ICD-10-CM | POA: Diagnosis present

## 2017-03-24 DIAGNOSIS — E039 Hypothyroidism, unspecified: Secondary | ICD-10-CM | POA: Diagnosis present

## 2017-03-24 DIAGNOSIS — Z3483 Encounter for supervision of other normal pregnancy, third trimester: Secondary | ICD-10-CM | POA: Diagnosis present

## 2017-03-24 DIAGNOSIS — O99284 Endocrine, nutritional and metabolic diseases complicating childbirth: Secondary | ICD-10-CM | POA: Diagnosis present

## 2017-03-24 LAB — CBC
HCT: 35.5 % — ABNORMAL LOW (ref 36.0–46.0)
HEMOGLOBIN: 11.9 g/dL — AB (ref 12.0–15.0)
MCH: 29.2 pg (ref 26.0–34.0)
MCHC: 33.5 g/dL (ref 30.0–36.0)
MCV: 87.2 fL (ref 78.0–100.0)
PLATELETS: 201 10*3/uL (ref 150–400)
RBC: 4.07 MIL/uL (ref 3.87–5.11)
RDW: 14.7 % (ref 11.5–15.5)
WBC: 6.4 10*3/uL (ref 4.0–10.5)

## 2017-03-24 LAB — TYPE AND SCREEN
ABO/RH(D): A POS
ANTIBODY SCREEN: NEGATIVE

## 2017-03-24 LAB — RPR: RPR: NONREACTIVE

## 2017-03-24 MED ORDER — LACTATED RINGERS IV SOLN
500.0000 mL | Freq: Once | INTRAVENOUS | Status: AC
  Administered 2017-03-24: 500 mL via INTRAVENOUS

## 2017-03-24 MED ORDER — ZOLPIDEM TARTRATE 5 MG PO TABS: 5.0000 mg | ORAL_TABLET | Freq: Every evening | ORAL | Status: DC | PRN

## 2017-03-24 MED ORDER — DIPHENHYDRAMINE HCL 25 MG PO CAPS: 25.0000 mg | ORAL_CAPSULE | Freq: Four times a day (QID) | ORAL | Status: DC | PRN

## 2017-03-24 MED ORDER — ACETAMINOPHEN 325 MG PO TABS
650.0000 mg | ORAL_TABLET | ORAL | Status: DC | PRN
  Filled 2017-03-24: qty 2

## 2017-03-24 MED ORDER — SODIUM CHLORIDE 0.9 % IV SOLN
2.0000 g | Freq: Once | INTRAVENOUS | Status: AC
  Administered 2017-03-24: 2 g via INTRAVENOUS
  Filled 2017-03-24: qty 2000

## 2017-03-24 MED ORDER — IBUPROFEN 600 MG PO TABS
600.0000 mg | ORAL_TABLET | Freq: Four times a day (QID) | ORAL | Status: DC
  Administered 2017-03-24 – 2017-03-26 (×8): 600 mg via ORAL
  Filled 2017-03-24 (×8): qty 1

## 2017-03-24 MED ORDER — ONDANSETRON HCL 4 MG PO TABS: 4.0000 mg | ORAL_TABLET | ORAL | Status: DC | PRN

## 2017-03-24 MED ORDER — SENNOSIDES-DOCUSATE SODIUM 8.6-50 MG PO TABS
2.0000 | ORAL_TABLET | ORAL | Status: DC
  Administered 2017-03-24 – 2017-03-26 (×2): 2 via ORAL
  Filled 2017-03-24 (×2): qty 2

## 2017-03-24 MED ORDER — PHENYLEPHRINE 40 MCG/ML (10ML) SYRINGE FOR IV PUSH (FOR BLOOD PRESSURE SUPPORT)
80.0000 ug | PREFILLED_SYRINGE | INTRAVENOUS | Status: DC | PRN
  Filled 2017-03-24: qty 10
  Filled 2017-03-24: qty 5

## 2017-03-24 MED ORDER — OXYTOCIN 40 UNITS IN LACTATED RINGERS INFUSION - SIMPLE MED
1.0000 m[IU]/min | INTRAVENOUS | Status: DC
  Administered 2017-03-24: 2 m[IU]/min via INTRAVENOUS

## 2017-03-24 MED ORDER — BENZOCAINE-MENTHOL 20-0.5 % EX AERO
1.0000 "application " | INHALATION_SPRAY | CUTANEOUS | Status: DC | PRN
  Administered 2017-03-24: 1 via TOPICAL
  Filled 2017-03-24: qty 56

## 2017-03-24 MED ORDER — TERBUTALINE SULFATE 1 MG/ML IJ SOLN
0.2500 mg | Freq: Once | INTRAMUSCULAR | Status: DC | PRN
  Filled 2017-03-24: qty 1

## 2017-03-24 MED ORDER — EPHEDRINE 5 MG/ML INJ
10.0000 mg | INTRAVENOUS | Status: DC | PRN
  Filled 2017-03-24: qty 2

## 2017-03-24 MED ORDER — ONDANSETRON HCL 4 MG/2ML IJ SOLN: 4.0000 mg | Freq: Four times a day (QID) | INTRAMUSCULAR | Status: DC | PRN

## 2017-03-24 MED ORDER — WITCH HAZEL-GLYCERIN EX PADS: 1.0000 "application " | MEDICATED_PAD | CUTANEOUS | Status: DC | PRN

## 2017-03-24 MED ORDER — SOD CITRATE-CITRIC ACID 500-334 MG/5ML PO SOLN: 30.0000 mL | ORAL | Status: DC | PRN

## 2017-03-24 MED ORDER — LIDOCAINE HCL (PF) 1 % IJ SOLN
30.0000 mL | INTRAMUSCULAR | Status: DC | PRN
  Filled 2017-03-24: qty 30

## 2017-03-24 MED ORDER — LACTATED RINGERS IV SOLN
500.0000 mL | INTRAVENOUS | Status: DC | PRN
  Administered 2017-03-24: 400 mL via INTRAVENOUS
  Administered 2017-03-24: 250 mL via INTRAVENOUS

## 2017-03-24 MED ORDER — BUTORPHANOL TARTRATE 1 MG/ML IJ SOLN: 1.0000 mg | INTRAMUSCULAR | Status: DC | PRN

## 2017-03-24 MED ORDER — OXYCODONE HCL 5 MG PO TABS: 10.0000 mg | ORAL_TABLET | ORAL | Status: DC | PRN

## 2017-03-24 MED ORDER — LIDOCAINE HCL (PF) 1 % IJ SOLN
INTRAMUSCULAR | Status: DC | PRN
  Administered 2017-03-24: 7 mL via EPIDURAL
  Administered 2017-03-24: 5 mL via EPIDURAL

## 2017-03-24 MED ORDER — LEVOTHYROXINE SODIUM 100 MCG PO TABS
100.0000 ug | ORAL_TABLET | Freq: Every day | ORAL | Status: DC
  Administered 2017-03-25 – 2017-03-26 (×2): 100 ug via ORAL
  Filled 2017-03-24 (×2): qty 1

## 2017-03-24 MED ORDER — DIPHENHYDRAMINE HCL 50 MG/ML IJ SOLN: 12.5000 mg | INTRAMUSCULAR | Status: DC | PRN

## 2017-03-24 MED ORDER — OXYCODONE HCL 5 MG PO TABS: 5.0000 mg | ORAL_TABLET | ORAL | Status: DC | PRN

## 2017-03-24 MED ORDER — OXYTOCIN BOLUS FROM INFUSION
500.0000 mL | Freq: Once | INTRAVENOUS | Status: AC
  Administered 2017-03-24: 500 mL via INTRAVENOUS

## 2017-03-24 MED ORDER — ONDANSETRON HCL 4 MG/2ML IJ SOLN: 4.0000 mg | INTRAMUSCULAR | Status: DC | PRN

## 2017-03-24 MED ORDER — PRENATAL MULTIVITAMIN CH
1.0000 | ORAL_TABLET | Freq: Every day | ORAL | Status: DC
  Administered 2017-03-25: 1 via ORAL
  Filled 2017-03-24: qty 1

## 2017-03-24 MED ORDER — SIMETHICONE 80 MG PO CHEW: 80.0000 mg | CHEWABLE_TABLET | ORAL | Status: DC | PRN

## 2017-03-24 MED ORDER — LACTATED RINGERS IV SOLN
INTRAVENOUS | Status: DC
  Administered 2017-03-24: 06:00:00 via INTRAVENOUS

## 2017-03-24 MED ORDER — FLEET ENEMA 7-19 GM/118ML RE ENEM: 1.0000 | ENEMA | RECTAL | Status: DC | PRN

## 2017-03-24 MED ORDER — TETANUS-DIPHTH-ACELL PERTUSSIS 5-2.5-18.5 LF-MCG/0.5 IM SUSP: 0.5000 mL | Freq: Once | INTRAMUSCULAR | Status: DC

## 2017-03-24 MED ORDER — DIBUCAINE 1 % RE OINT: 1.0000 "application " | TOPICAL_OINTMENT | RECTAL | Status: DC | PRN

## 2017-03-24 MED ORDER — FENTANYL 2.5 MCG/ML BUPIVACAINE 1/10 % EPIDURAL INFUSION (WH - ANES)
14.0000 mL/h | INTRAMUSCULAR | Status: DC | PRN
  Administered 2017-03-24 (×2): 14 mL/h via EPIDURAL
  Filled 2017-03-24: qty 100

## 2017-03-24 MED ORDER — EPHEDRINE 5 MG/ML INJ
10.0000 mg | INTRAVENOUS | Status: DC | PRN
Start: 2017-03-24 — End: 2017-03-25
  Filled 2017-03-24: qty 2

## 2017-03-24 MED ORDER — OXYCODONE-ACETAMINOPHEN 5-325 MG PO TABS: 1.0000 | ORAL_TABLET | ORAL | Status: DC | PRN

## 2017-03-24 MED ORDER — OXYTOCIN 40 UNITS IN LACTATED RINGERS INFUSION - SIMPLE MED
2.5000 [IU]/h | INTRAVENOUS | Status: DC
  Filled 2017-03-24: qty 1000

## 2017-03-24 MED ORDER — COCONUT OIL OIL
1.0000 "application " | TOPICAL_OIL | Status: DC | PRN
  Administered 2017-03-25: 1 via TOPICAL
  Filled 2017-03-24: qty 120

## 2017-03-24 MED ORDER — PHENYLEPHRINE 40 MCG/ML (10ML) SYRINGE FOR IV PUSH (FOR BLOOD PRESSURE SUPPORT)
80.0000 ug | PREFILLED_SYRINGE | INTRAVENOUS | Status: AC | PRN
  Administered 2017-03-24 (×3): 80 ug via INTRAVENOUS

## 2017-03-24 MED ORDER — ACETAMINOPHEN 325 MG PO TABS
650.0000 mg | ORAL_TABLET | ORAL | Status: DC | PRN
  Administered 2017-03-25: 650 mg via ORAL

## 2017-03-24 MED ORDER — OXYCODONE-ACETAMINOPHEN 5-325 MG PO TABS: 2.0000 | ORAL_TABLET | ORAL | Status: DC | PRN

## 2017-03-24 NOTE — Anesthesia Preprocedure Evaluation (Signed)
Anesthesia Evaluation  Patient identified by MRN, date of birth, ID band Patient awake    Reviewed: Allergy & Precautions, H&P , NPO status , Patient's Chart, lab work & pertinent test results  Airway Mallampati: II  TM Distance: >3 FB Neck ROM: full    Dental no notable dental hx.    Pulmonary neg pulmonary ROS,    Pulmonary exam normal breath sounds clear to auscultation       Cardiovascular negative cardio ROS   Rhythm:regular Rate:Normal     Neuro/Psych negative neurological ROS  negative psych ROS   GI/Hepatic negative GI ROS, Neg liver ROS,   Endo/Other    Renal/GU negative Renal ROS     Musculoskeletal   Abdominal (+) + obese,   Peds  Hematology negative hematology ROS (+)   Anesthesia Other Findings   Reproductive/Obstetrics (+) Pregnancy                             Anesthesia Physical Anesthesia Plan  ASA: II  Anesthesia Plan: Epidural   Post-op Pain Management:    Induction:   PONV Risk Score and Plan:   Airway Management Planned:   Additional Equipment:   Intra-op Plan:   Post-operative Plan:   Informed Consent: I have reviewed the patients History and Physical, chart, labs and discussed the procedure including the risks, benefits and alternatives for the proposed anesthesia with the patient or authorized representative who has indicated his/her understanding and acceptance.     Plan Discussed with:   Anesthesia Plan Comments:         Anesthesia Quick Evaluation

## 2017-03-24 NOTE — Anesthesia Postprocedure Evaluation (Signed)
Anesthesia Post Note  Patient: Carol Welch  Procedure(s) Performed: AN AD HOC LABOR EPIDURAL     Patient location during evaluation: Mother Baby Anesthesia Type: Epidural Level of consciousness: awake and alert and oriented Pain management: satisfactory to patient Vital Signs Assessment: post-procedure vital signs reviewed and stable Respiratory status: spontaneous breathing and nonlabored ventilation Cardiovascular status: stable Postop Assessment: no headache, no backache, no signs of nausea or vomiting, adequate PO intake and patient able to bend at knees (patient up walking) Anesthetic complications: no    Last Vitals:  Vitals:   03/24/17 1408 03/24/17 1822  BP: (!) 98/57 104/62  Pulse: 65 64  Resp: 16 16  Temp: 36.7 C 36.7 C  SpO2: 98% 99%    Last Pain:  Vitals:   03/24/17 1822  TempSrc: Oral  PainSc: 6    Pain Goal: Patients Stated Pain Goal: 3 (03/24/17 1822)               Madison HickmanGREGORY,Darek Eifler

## 2017-03-24 NOTE — Anesthesia Procedure Notes (Signed)
Epidural Patient location during procedure: OB Start time: 03/24/2017 7:01 AM End time: 03/24/2017 7:05 AM  Staffing Anesthesiologist: Leilani AbleHatchett, Ben Sanz, MD Performed: anesthesiologist   Preanesthetic Checklist Completed: patient identified, site marked, surgical consent, pre-op evaluation, timeout performed, IV checked, risks and benefits discussed and monitors and equipment checked  Epidural Patient position: sitting Prep: site prepped and draped and DuraPrep Patient monitoring: continuous pulse ox and blood pressure Approach: midline Location: L3-L4 Injection technique: LOR air  Needle:  Needle type: Tuohy  Needle gauge: 17 G Needle length: 9 cm and 9 Needle insertion depth: 6 cm Catheter type: closed end flexible Catheter size: 19 Gauge Catheter at skin depth: 11 cm Test dose: negative  Assessment Events: blood not aspirated, injection not painful, no injection resistance, negative IV test and paresthesia  Additional Notes R side X 1Reason for block:procedure for pain

## 2017-03-24 NOTE — Progress Notes (Signed)
Patient ID: Carol Welch, female   DOB: Jun 03, 1975, 41 y.o.   MRN: [id number] Pt doing well. Comfortable with epidural. No complaints VSS EFM - 145, cat 1 TOCO - ctxs q 3-85mins  SVE 8-9/100/-1  A/P: Multip progressing on pitocin ( now at 4mus)          AROM performed with clear fluid - copious         Anticipate svd

## 2017-03-24 NOTE — Lactation Note (Signed)
This note was copied from a baby's chart. Lactation Consultation Note  Patient Name: Carol Welch Carol Welch [id number]'[id number]'s Date: 03/24/2017 Reason for consult: Initial assessment   P5, Mother is resting. Baby 11 hours old.  BR/FO She states she breastfed her 3rd child for 5 months and 4th child was in NICU so she pumped. Encouraged mother to breastfeed before offering formula. Provided volume guidelines. Mother denies questions or concerns. Mom encouraged to feed baby 8-12 times/24 hours and with feeding cues.  Mom made aware of O/P services, breastfeeding support groups, community resources, and our phone # for post-discharge questions.      Maternal Data Has patient been taught Hand Expression?: Yes Does the patient have breastfeeding experience prior to this delivery?: Yes  Feeding    LATCH Score                   Interventions Interventions: Breast feeding basics reviewed  Lactation Tools Discussed/Used     Consult Status Consult Status: PRN    Hardie PulleyBerkelhammer, Harith Mccadden Boschen 03/24/2017, 10:01 PM

## 2017-03-24 NOTE — MAU Note (Signed)
Pt presents to MAU c/o ctxs every 5-7 min. Pt also reports a possible SROM @ 0455 this morning pt states the fluid was clear but now is "bloody". Pt reports good FM.

## 2017-03-24 NOTE — H&P (Signed)
Carol Welch is a 41 y.o. female [phone] at 7538 presents in active labor.  Has been here multiple times in false labor.  Pregnancy complicated by AMA, FH of congenital anomaly ( last child with imperforate anus ), traveled to [place] in [place] - decline Z[place] testing.  Also hypothyroid.  Has also signed BTL consent 01/08/17 have d/w pt r/b/a.  Also received Tdap 01/12/17  OB History    Gravida Para Term Preterm AB Living   6 4 4  0 1 4   SAB TAB Ectopic Multiple Live Births   1 0 0 0 1    SVD x 4, SAB x 1; no PIH, GDM.  Last child w imperforate anus.  [id number] + abn pap + STD - GC/Chl  Past Medical History:  Diagnosis Date  . Colitis   . Hypothyroid   . SVD (spontaneous vaginal delivery) 06/30/2015   Past Surgical History:  Procedure Laterality Date  . NO PAST SURGERIES     Family History: family history includes Cancer in her maternal aunt and maternal grandmother; Heart disease in her father and paternal grandfather. Social History:  reports that  has never smoked. she has never used smokeless tobacco. She reports that she does not drink alcohol or use drugs. SAHM, separated Meds: levothyroxine, PNV All NKDA     Maternal Diabetes: No Genetic Screening: Declined Maternal Ultrasounds/Referrals: Normal Fetal Ultrasounds or other Referrals:  None Maternal Substance Abuse:  No Significant Maternal Medications:  None Significant Maternal Lab Results:  Lab values include: Group B Strep positive Other Comments:  FH of imperforate anus, went [place] - [place], AMA  Review of Systems  Constitutional: Negative.   HENT: Negative.   Eyes: Negative.   Respiratory: Negative.   Cardiovascular: Negative.   Gastrointestinal: Negative.   Genitourinary: Negative.   Musculoskeletal: Positive for back pain.  Skin: Negative.   Neurological: Negative.   Psychiatric/Behavioral: Negative.    Maternal Medical History:  Reason for admission: Contractions.   Contractions: Onset  was more than 2 days ago.   Frequency: irregular.    Fetal activity: Perceived fetal activity is normal.    Prenatal Complications - Diabetes: none.    Dilation: 7 Effacement (%): 60 Station: -2 Exam by:: lauren fields rn  Blood pressure 105/70, pulse 100, temperature 97.7 F (36.5 C), temperature source Oral, resp. rate 18, height 5\' 2"  (1.575 m), weight 93.4 kg (206 lb), last menstrual period 06/23/2016, SpO2 99 %, unknown if currently breastfeeding. Maternal Exam:  Uterine Assessment: Contraction strength is moderate.  Contraction frequency is irregular.   Abdomen: Patient reports no abdominal tenderness. Fundal height is appropriate for gestation.   Estimated fetal weight is 7#.   Fetal presentation: vertex  Introitus: Normal vulva. Normal vagina.  Cervix: Cervix evaluated by digital exam.     Physical Exam  Constitutional: She is oriented to person, place, and time. She appears well-developed and well-nourished.  HENT:  Head: Normocephalic and atraumatic.  Cardiovascular: Normal rate and regular rhythm.  Respiratory: Effort normal and breath sounds normal. No respiratory distress. She has no wheezes.  GI: Soft. Bowel sounds are normal. She exhibits no distension. There is no tenderness.  Musculoskeletal: Normal range of motion.  Neurological: She is alert and oriented to person, place, and time.  Skin: Skin is warm and dry.  Psychiatric: She has a normal mood and affect. Her behavior is normal.    Prenatal labs: ABO, Rh: --/--/A POS (11/19 [id number]) Antibody: PENDING (11/19 0610) Rubella:  immune  RPR:   NR HBsAg:   neg HIV:   neg GBS: Positive (11/06 0000)   Dated by first trim US US cwd - ant plac, nl anat  gb 12.7/Plt WNL/GC neg/Chl neg/TSH WNL/ VNI/glucola 133/ur cx neg  Assessment/Plan: 41yo [id number] at 38wk in active labor GBBS + prophylaxe before ROM Pitocin to augment Epidural prn Expect SVD Desires PP BTL  Carol Welch 03/24/2017, 7:17  AM

## 2017-03-25 LAB — CBC
HCT: 30.3 % — ABNORMAL LOW (ref 36.0–46.0)
HEMOGLOBIN: 10.2 g/dL — AB (ref 12.0–15.0)
MCH: 29.5 pg (ref 26.0–34.0)
MCHC: 33.7 g/dL (ref 30.0–36.0)
MCV: 87.6 fL (ref 78.0–100.0)
Platelets: 174 10*3/uL (ref 150–400)
RBC: 3.46 MIL/uL — ABNORMAL LOW (ref 3.87–5.11)
RDW: 14.9 % (ref 11.5–15.5)
WBC: 7.4 10*3/uL (ref 4.0–10.5)

## 2017-03-25 NOTE — Progress Notes (Signed)
PPD #1 No problems Afeb, VSS Fundus firm, NT at U-1 Continue routine postpartum care 

## 2017-03-26 MED ORDER — IBUPROFEN 600 MG PO TABS: 600.0000 mg | ORAL_TABLET | Freq: Four times a day (QID) | ORAL | 1 refills | Status: DC | PRN

## 2017-03-26 MED ORDER — PRENATAL MULTIVITAMIN CH: 1.0000 | ORAL_TABLET | Freq: Every day | ORAL | 3 refills | Status: DC

## 2017-03-26 NOTE — Progress Notes (Signed)
Post Partum Day 2 Subjective: no complaints, up ad lib, voiding, tolerating PO and nl lochia, pain controlled  Objective: Blood pressure (!) 95/57, pulse 63, temperature 97.7 F (36.5 C), temperature source Oral, resp. rate 18, height 5\' 2"  (1.575 m), weight 93.4 kg (206 lb), last menstrual period 06/23/2016, SpO2 97 %, unknown if currently breastfeeding.  Physical Exam:  General: alert and no distress Lochia: appropriate Uterine Fundus: firm   Recent Labs    03/24/17 0610 03/25/17 0531  HGB 11.9* 10.2*  HCT 35.5* 30.3*    Assessment/Plan: Plan for discharge tomorrow, Breastfeeding and Lactation consult.  Routine PP care.  D/c with motrin and PNV.   LOS: 2 days   Carol Welch 03/26/2017, 8:20 AM

## 2017-03-26 NOTE — Discharge Summary (Signed)
OB Discharge Summary     Patient Name: Carol Welch DOB: [birth year] MRN: [id number]  Date of admission: 03/24/2017 Delivering MD: Pryor OchoaBANGA, CECILIA [facility]   Date of discharge: 03/26/2017  Admitting diagnosis: 38 WEEKS CTX BLEEDING Intrauterine pregnancy: 8270w0d     Secondary diagnosis:  Active Problems:   Indication for care in labor or delivery   Spontaneous vaginal delivery   Postpartum care following vaginal delivery  Additional problems: N/A     Discharge diagnosis: Term Pregnancy Delivered                                                                                                Post partum procedures:N/A  Augmentation: AROM  Complications: None  Hospital course:  Onset of Labor With Vaginal Delivery     41 y.o. yo [id number] at 3770w0d was admitted in Active Labor on 03/24/2017. Patient had an uncomplicated labor course as follows:  Membrane Rupture Time/Date: 9:42 AM ,03/24/2017   Intrapartum Procedures: Episiotomy: None [1]                                         Lacerations:  None [1]  Patient had a delivery of a Viable infant. 03/24/2017  Information for the patient's newborn:  Melanee SpryDiaz, Girl Jamika [[id number]][[id number]]  Delivery Method: Vag-Spont    Pateint had an uncomplicated postpartum course.  She is ambulating, tolerating a regular diet, passing flatus, and urinating well. Patient is discharged home in stable condition on 03/26/17.   Physical exam  Vitals:   03/25/17 0500 03/25/17 1000 03/25/17 1821 03/26/17 0551  BP: 114/60 (!) 103/53 103/74 (!) 95/57  Pulse: (!) 55 69 64 63  Resp: 14 15 16 18   Temp: 97.6 F (36.4 C) 97.7 F (36.5 C) 98 F (36.7 C) 97.7 F (36.5 C)  TempSrc: Oral Oral Oral Oral  SpO2: 98% 98%  97%  Weight:      Height:       General: alert and no distress Lochia: appropriate Uterine Fundus: firm Labs: Lab Results  Component Value Date   WBC 7.4 03/25/2017   HGB 10.2 (L) 03/25/2017   HCT 30.3 (L) 03/25/2017   MCV 87.6 03/25/2017   PLT 174 03/25/2017   CMP Latest Ref Rng & Units 09/17/2016  Glucose 65 - 99 mg/dL 88  BUN 6 - 20 mg/dL 9  Creatinine 1.300.44 - 8.651.00 mg/dL 7.84(O0.43(L)  Sodium 962135 - 952145 mmol/L 134(L)  Potassium 3.5 - 5.1 mmol/L 4.0  Chloride 101 - 111 mmol/L 106  CO2 22 - 32 mmol/L 22  Calcium 8.9 - 10.3 mg/dL 8.4(X8.5(L)  Total Protein 6.5 - 8.1 g/dL 6.4(L)  Total Bilirubin 0.3 - 1.2 mg/dL 0.4  Alkaline Phos 38 - 126 U/L 51  AST 15 - 41 U/L 15  ALT 14 - 54 U/L 10(L)    Discharge instruction: per After Visit Summary and "Baby and Me Booklet".  After visit meds:  Allergies as of 03/26/2017   No Known Allergies     Medication  List    TAKE these medications   acetaminophen 500 MG tablet Commonly known as:  TYLENOL Take 1,000 mg by mouth every 6 (six) hours as needed for mild pain, moderate pain, fever or headache.   ibuprofen 600 MG tablet Commonly known as:  ADVIL,MOTRIN Take 1 tablet (600 mg total) by mouth every 6 (six) hours as needed.   IRON PO Take 2 tablets daily by mouth. Over the counter, not sure of dose   levothyroxine 100 MCG tablet Commonly known as:  SYNTHROID, LEVOTHROID Take 100 mcg by mouth daily before breakfast.   prenatal multivitamin Tabs tablet Take 1 tablet by mouth at bedtime.       Diet: routine diet  Activity: Advance as tolerated. Pelvic rest for 6 weeks.   Outpatient follow up:6 weeks Follow up Appt: Future Appointments  Date Time Provider Department Center  06/10/2017 12:00 PM [facility]  06/10/2017  1:00 PM Pryor OchoaLawson, James D, MD [facility]   Follow up Visit:No Follow-up on file.  Postpartum contraception: Undecided  Newborn Data: Live born female  Birth Weight: 6 lb 10.4 oz (3015 g) APGAR: 9, 10  Newborn Delivery   Birth date/time:  03/24/2017 10:34:00 Delivery type:  Vaginal, Spontaneous     Baby Feeding: Breast Disposition:home with mother   03/26/2017 Sherian ReinJody Bovard-Stuckert, MD

## 2017-03-26 NOTE — Lactation Note (Signed)
This note was copied from a baby's chart. Lactation Consultation Note Baby is 4439 hrs old. Mom plans to pump, bottle and formula feed baby when home. Mom has DEBP at home. Mom has been mainly formula feeding. Mom states she knows about milk supply and formula feeding. Discussed engorgement, management, supply and demand.  Encouraged mom to call if has questions or concerns before d/c home' Mom stated she didn't need to see Lc before d/c home.  Patient Name: Carol Welch: 03/26/2017 Reason for consult: Follow-up assessment   Maternal Data    Feeding Feeding Type: Bottle Fed - Formula Nipple Type: Slow - flow  LATCH Score                   Interventions    Lactation Tools Discussed/Used     Consult Status Consult Status: Complete Welch: 03/26/17    Charyl DancerCARVER, Reise Hietala G 03/26/2017, 1:40 AM

## 2017-06-10 ENCOUNTER — Ambulatory Visit: Payer: Medicaid Other | Admitting: Vascular Surgery

## 2017-06-10 ENCOUNTER — Encounter (HOSPITAL_COMMUNITY): Payer: Medicaid Other

## 2017-06-17 ENCOUNTER — Other Ambulatory Visit: Payer: Self-pay | Admitting: Obstetrics and Gynecology

## 2017-06-17 DIAGNOSIS — N644 Mastodynia: Secondary | ICD-10-CM

## 2017-06-20 ENCOUNTER — Ambulatory Visit
Admission: RE | Admit: 2017-06-20 | Discharge: 2017-06-20 | Disposition: A | Discharge status: Home / Self Care | Payer: No Typology Code available for payment source | Source: Ambulatory Visit | Attending: Obstetrics and Gynecology | Admitting: Obstetrics and Gynecology

## 2017-06-20 DIAGNOSIS — N644 Mastodynia: Secondary | ICD-10-CM

## 2017-06-23 ENCOUNTER — Encounter (HOSPITAL_COMMUNITY): Payer: Medicaid Other

## 2017-06-24 ENCOUNTER — Ambulatory Visit: Payer: Medicaid Other | Admitting: Vascular Surgery

## 2018-02-06 ENCOUNTER — Other Ambulatory Visit: Payer: Self-pay | Admitting: Obstetrics and Gynecology

## 2018-02-06 DIAGNOSIS — N6310 Unspecified lump in the right breast, unspecified quadrant: Secondary | ICD-10-CM

## 2018-02-13 ENCOUNTER — Ambulatory Visit
Admission: RE | Admit: 2018-02-13 | Discharge: 2018-02-13 | Disposition: A | Discharge status: Home / Self Care | Payer: No Typology Code available for payment source | Source: Ambulatory Visit | Attending: Obstetrics and Gynecology | Admitting: Obstetrics and Gynecology

## 2018-02-13 DIAGNOSIS — N6310 Unspecified lump in the right breast, unspecified quadrant: Secondary | ICD-10-CM

## 2018-02-13 HISTORY — PX: BREAST CYST ASPIRATION: SHX578

## 2018-07-06 ENCOUNTER — Encounter: Payer: Self-pay | Admitting: Podiatry

## 2018-07-06 ENCOUNTER — Other Ambulatory Visit: Payer: Self-pay

## 2018-07-06 ENCOUNTER — Ambulatory Visit: Payer: Medicaid Other | Admitting: Podiatry

## 2018-07-06 VITALS — BP 137/59 | HR 82

## 2018-07-06 DIAGNOSIS — E039 Hypothyroidism, unspecified: Secondary | ICD-10-CM | POA: Insufficient documentation

## 2018-07-06 DIAGNOSIS — N739 Female pelvic inflammatory disease, unspecified: Secondary | ICD-10-CM | POA: Insufficient documentation

## 2018-07-06 DIAGNOSIS — I83893 Varicose veins of bilateral lower extremities with other complications: Secondary | ICD-10-CM

## 2018-07-06 DIAGNOSIS — O09529 Supervision of elderly multigravida, unspecified trimester: Secondary | ICD-10-CM | POA: Insufficient documentation

## 2018-07-06 DIAGNOSIS — Z79899 Other long term (current) drug therapy: Secondary | ICD-10-CM

## 2018-07-06 DIAGNOSIS — R87619 Unspecified abnormal cytological findings in specimens from cervix uteri: Secondary | ICD-10-CM | POA: Insufficient documentation

## 2018-07-06 DIAGNOSIS — B353 Tinea pedis: Secondary | ICD-10-CM | POA: Diagnosis not present

## 2018-07-06 DIAGNOSIS — B351 Tinea unguium: Secondary | ICD-10-CM

## 2018-07-06 MED ORDER — TERBINAFINE HCL 250 MG PO TABS: 250.0000 mg | ORAL_TABLET | Freq: Every day | ORAL | 0 refills | Status: DC

## 2018-07-06 NOTE — Patient Instructions (Signed)

## 2018-07-06 NOTE — Progress Notes (Signed)
Subjective:   Patient ID: Carol Welch, female   DOB: 43 y.o.   MRN: [id number]   HPI 43 year old female presents to the office today for concerns of toenail discoloration and thickening.  Her right fifth toenail is mostly thick and discolored but recently she started to notice that she has been discoloration to her left big toe she is also have a rash in between her left first and second toes which itches and is red.  She is tried over-the-counter topical antifungals for short time for the nails did not help significantly.  She denies any pain in the nails and she denies any redness or drainage or any swelling.  She has no other concerns.   Review of Systems  All other systems reviewed and are negative.  Past Medical History:  Diagnosis Date  . Colitis   . Hypothyroid   . SVD (spontaneous vaginal delivery) 06/30/2015    Past Surgical History:  Procedure Laterality Date  . NO PAST SURGERIES       Current Outpatient Medications:  .  acetaminophen (TYLENOL) 500 MG tablet, Take 1,000 mg by mouth every 6 (six) hours as needed for mild pain, moderate pain, fever or headache. , Disp: , Rfl:  .  drospirenone-ethinyl estradiol (YAZ,GIANVI,LORYNA) 3-0.02 MG tablet, TK 1 T PO QD UTD, Disp: , Rfl:  .  ibuprofen (ADVIL,MOTRIN) 600 MG tablet, Take 1 tablet (600 mg total) by mouth every 6 (six) hours as needed., Disp: 45 tablet, Rfl: 1 .  IRON PO, Take 2 tablets daily by mouth. Over the counter, not sure of dose, Disp: , Rfl:  .  levothyroxine (SYNTHROID, LEVOTHROID) 100 MCG tablet, Take 100 mcg by mouth daily before breakfast. , Disp: , Rfl:  .  medroxyPROGESTERone (DEPO-PROVERA) 150 MG/ML injection, medroxyprogesterone 150 mg/mL intramuscular suspension  Inject 1 mL every 3 months by intramuscular route., Disp: , Rfl:  .  medroxyPROGESTERone Acetate 150 MG/ML SUSY, medroxyprogesterone 150 mg/mL intramuscular syringe  INJECT 1 ML EVERY 3 MONTHS, Disp: , Rfl:  .  nystatin-triamcinolone (MYCOLOG II)  cream, nystatin-triamcinolone 100,000 unit/g-0.1 % topical cream  APPLY SMALL AMOUNT TO THE EXTERNAL AFFECTED AREAS BID PRN, Disp: , Rfl:  .  phentermine (ADIPEX-P) 37.5 MG tablet, phentermine 37.5 mg tablet  TK 1 T PO QD, Disp: , Rfl:  .  predniSONE (DELTASONE) 20 MG tablet, prednisone 20 mg tablet  TK 3 TS PO ON DAY 1 THEN TK 2 TS ONCE D FOR DAYS 2 THROUGH 5, Disp: , Rfl:  .  Prenatal Vit-Fe Fumarate-FA (PRENATAL MULTIVITAMIN) TABS tablet, Take 1 tablet by mouth at bedtime., Disp: 100 tablet, Rfl: 3 .  sertraline (ZOLOFT) 50 MG tablet, sertraline 50 mg tablet  TK 1 T PO BID UTD, Disp: , Rfl:  .  terbinafine (LAMISIL) 250 MG tablet, Take 1 tablet (250 mg total) by mouth daily., Disp: 90 tablet, Rfl: 0  No Known Allergies     Objective:  Physical Exam  General: AAO x3, NAD  Dermatological: The right fifth digit toenail is hypertrophic, dystrophic with yellow to brown discoloration.  Also there is starting to be yellow-brown discoloration of the left hallux toenail.  There is no pain in the nails especially redness or drainage or signs of infection.  There is erythematous skin rash present on the first interspace of the left foot.  Subjectively does itch but there is no drainage or pustules present.  No open sores otherwise.  Vascular: Dorsalis Pedis artery and Posterior Tibial artery pedal pulses are 2/4  bilateral with immedate capillary fill time. There is no pain with calf compression, swelling, warmth, erythema.   Neruologic: Grossly intact via light touch bilateral. Protective threshold with Semmes Wienstein monofilament intact to all pedal sites bilateral.  Musculoskeletal: Adductovarus of fifth toes.  Muscular strength 5/5 in all groups tested bilateral.  Gait: Unassisted, Nonantalgic.       Assessment:   Onychomycosis, tinea pedis    Plan:  -Treatment options discussed including all alternatives, risks, and complications -Etiology of symptoms were discussed -We discussed  multiple treatment options.  After we discussed the options she is elected to proceed with oral therapy.  We discussed Lamisil.  We discussed the side effects the medication as well as duration of use and success rates.  She wants to proceed understanding risks.  She is currently not breast-feeding or pregnant and she is no longer taking antidepressants. -Prescribed Lamisil today.  Prior to starting medication Check a CBC and LFT.  She is aware that she is not supposed start medicines I called with the results of the blood work and she verbally understood.  We will follow-up in 4 to 6 weeks or sooner if needed.  Vivi Barrack DPM

## 2018-07-07 LAB — HEPATIC FUNCTION PANEL
AG Ratio: 1.4 (calc) (ref 1.0–2.5)
ALKALINE PHOSPHATASE (APISO): 45 U/L (ref 31–125)
ALT: 8 U/L (ref 6–29)
AST: 11 U/L (ref 10–30)
Albumin: 3.8 g/dL (ref 3.6–5.1)
BILIRUBIN INDIRECT: 0.2 mg/dL (ref 0.2–1.2)
Bilirubin, Direct: 0.1 mg/dL (ref 0.0–0.2)
Globulin: 2.8 g/dL (calc) (ref 1.9–3.7)
TOTAL PROTEIN: 6.6 g/dL (ref 6.1–8.1)
Total Bilirubin: 0.3 mg/dL (ref 0.2–1.2)

## 2018-07-07 LAB — CBC WITH DIFFERENTIAL/PLATELET
Absolute Monocytes: 342 cells/uL (ref 200–950)
BASOS ABS: 50 {cells}/uL (ref 0–200)
Basophils Relative: 0.9 %
Eosinophils Absolute: 62 cells/uL (ref 15–500)
Eosinophils Relative: 1.1 %
HEMATOCRIT: 38.3 % (ref 35.0–45.0)
Hemoglobin: 13 g/dL (ref 11.7–15.5)
LYMPHS ABS: 2386 {cells}/uL (ref 850–3900)
MCH: 29.3 pg (ref 27.0–33.0)
MCHC: 33.9 g/dL (ref 32.0–36.0)
MCV: 86.3 fL (ref 80.0–100.0)
MPV: 10.1 fL (ref 7.5–12.5)
Monocytes Relative: 6.1 %
NEUTROS ABS: 2761 {cells}/uL (ref 1500–7800)
Neutrophils Relative %: 49.3 %
Platelets: 264 10*3/uL (ref 140–400)
RBC: 4.44 10*6/uL (ref 3.80–5.10)
RDW: 12.6 % (ref 11.0–15.0)
TOTAL LYMPHOCYTE: 42.6 %
WBC: 5.6 10*3/uL (ref 3.8–10.8)

## 2018-07-08 ENCOUNTER — Telehealth: Payer: Self-pay | Admitting: *Deleted

## 2018-07-08 NOTE — Telephone Encounter (Signed)
I informed pt of Dr. Wagoner's review of results and orders. 

## 2018-07-08 NOTE — Telephone Encounter (Signed)
-----   Message from Vivi Barrack, DPM sent at 07/07/2018  5:23 PM EST ----- Val- please let her know that the blood work is normal and she can start Lamisil. Thanks.

## 2018-07-09 ENCOUNTER — Ambulatory Visit (HOSPITAL_COMMUNITY)
Admission: RE | Admit: 2018-07-09 | Discharge: 2018-07-09 | Disposition: A | Discharge status: Home / Self Care | Payer: Medicaid Other | Source: Ambulatory Visit | Attending: Vascular Surgery | Admitting: Vascular Surgery

## 2018-07-09 ENCOUNTER — Other Ambulatory Visit: Payer: Self-pay

## 2018-07-09 ENCOUNTER — Encounter: Payer: Self-pay | Admitting: Vascular Surgery

## 2018-07-09 ENCOUNTER — Ambulatory Visit (INDEPENDENT_AMBULATORY_CARE_PROVIDER_SITE_OTHER): Payer: Medicaid Other | Admitting: Vascular Surgery

## 2018-07-09 VITALS — BP 109/73 | HR 87 | Temp 98.7°F | Resp 18 | Ht 62.0 in | Wt 206.0 lb

## 2018-07-09 DIAGNOSIS — I83813 Varicose veins of bilateral lower extremities with pain: Secondary | ICD-10-CM | POA: Diagnosis not present

## 2018-07-09 DIAGNOSIS — I83893 Varicose veins of bilateral lower extremities with other complications: Secondary | ICD-10-CM | POA: Diagnosis present

## 2018-07-09 NOTE — Progress Notes (Signed)
REASON FOR CONSULT:    Follow-up of painful varicose veins.  ASSESSMENT & PLAN:   CHRONIC VENOUS INSUFFICIENCY: This patient has symptoms of venous hypertension bilaterally but more significantly on the right side.  She has CEAP C3 venous disease.  On the right side she has significant superficial venous reflux involving the great saphenous vein.  The vein is significantly dilated.  She has failed conservative treatment.  I think she would be a good candidate for endovenous laser ablation of the right great saphenous vein.  I would likely cannulate the vein just above the knee.  She does have some spider veins and reticular veins in these could potentially be addressed with sclerotherapy in the future if they were bothersome.  I have discussed the indications for endovenous laser ablation of the right GSV, that is to lower the pressure in the veins and potentially help relieve the symptoms from venous hypertension. I have also discussed alternative options including conservative treatment with leg elevation, compression therapy, exercise, avoiding prolonged sitting and standing, and weight management. I have discussed the potential complications of the procedure, including, but not limited to: bleeding, bruising, leg swelling, nerve injury, skin burns, significant pain from phlebitis, deep venous thrombosis, or failure of the vein to close.  I have also explained that venous insufficiency is a chronic disease, and that the patient is at risk for recurrent varicose veins in the future.  All of the patient's questions were encouraged and answered. They are agreeable to proceed.   A total of 30 minutes was spent on this visit. 15 minutes was face to face time. More than 50% of the time was spent on counseling and coordinating with the patient.    Carol Ferrari, MD, FACS Beeper [phone] Office: [phone]   HPI:   Carol Welch is a pleasant 43 y.o. female, who was last seen in our office in  November 2018 by Dr. Hart Rochester.  She had bilateral painful varicose veins.  She had been having pain and swelling in both legs that had gotten worse over the last few years.  Dr. Hart Rochester discussed with her the importance of wearing thigh-high compression stockings with a gradient of 20-30, elevating her legs, taking ibuprofen as needed for pain.  She was referred to return in 3 months to consider bilateral laser ablation of the saphenous veins.  The right side was the more significant side at the time.  She did not return for that 71-month follow-up visit.  She returns now.\  She is experienced aching pain and heaviness which is aggravated by standing and sitting and relieved somewhat with elevation for many years.  The symptoms have been gradually progressive.  She used to be able to run but is no longer able to run.  Past Medical History:  Diagnosis Date  . Colitis   . Hypothyroid   . SVD (spontaneous vaginal delivery) 06/30/2015    Family History  Problem Relation Age of Onset  . Heart disease Father   . Cancer Maternal Aunt   . Cancer Maternal Grandmother   . Heart disease Paternal Grandfather     SOCIAL HISTORY: Social History   Socioeconomic History  . Marital status: Married    Spouse name: Not on file  . Number of children: Not on file  . Years of education: Not on file  . Highest education level: Not on file  Occupational History  . Not on file  Social Needs  . Financial resource strain: Not on file  .  Food insecurity:    Worry: Not on file    Inability: Not on file  . Transportation needs:    Medical: Not on file    Non-medical: Not on file  Tobacco Use  . Smoking status: Never Smoker  . Smokeless tobacco: Never Used  Substance and Sexual Activity  . Alcohol use: No  . Drug use: No  . Sexual activity: Yes    Birth control/protection: None  Lifestyle  . Physical activity:    Days per week: Not on file    Minutes per session: Not on file  . Stress: Not on file    Relationships  . Social connections:    Talks on phone: Not on file    Gets together: Not on file    Attends religious service: Not on file    Active member of club or organization: Not on file    Attends meetings of clubs or organizations: Not on file    Relationship status: Not on file  . Intimate partner violence:    Fear of current or ex partner: Not on file    Emotionally abused: Not on file    Physically abused: Not on file    Forced sexual activity: Not on file  Other Topics Concern  . Not on file  Social History Narrative  . Not on file    No Known Allergies  Current Outpatient Medications  Medication Sig Dispense Refill  . acetaminophen (TYLENOL) 500 MG tablet Take 1,000 mg by mouth every 6 (six) hours as needed for mild pain, moderate pain, fever or headache.     . drospirenone-ethinyl estradiol (YAZ,GIANVI,LORYNA) 3-0.02 MG tablet TK 1 T PO QD UTD    . ibuprofen (ADVIL,MOTRIN) 600 MG tablet Take 1 tablet (600 mg total) by mouth every 6 (six) hours as needed. 45 tablet 1  . IRON PO Take 2 tablets daily by mouth. Over the counter, not sure of dose    . levothyroxine (SYNTHROID, LEVOTHROID) 100 MCG tablet Take 100 mcg by mouth daily before breakfast.     . terbinafine (LAMISIL) 250 MG tablet Take 1 tablet (250 mg total) by mouth daily. 90 tablet 0  . medroxyPROGESTERone (DEPO-PROVERA) 150 MG/ML injection medroxyprogesterone 150 mg/mL intramuscular suspension  Inject 1 mL every 3 months by intramuscular route.    . medroxyPROGESTERone Acetate 150 MG/ML SUSY medroxyprogesterone 150 mg/mL intramuscular syringe  INJECT 1 ML EVERY 3 MONTHS    . nystatin-triamcinolone (MYCOLOG II) cream nystatin-triamcinolone 100,000 unit/g-0.1 % topical cream  APPLY SMALL AMOUNT TO THE EXTERNAL AFFECTED AREAS BID PRN    . phentermine (ADIPEX-P) 37.5 MG tablet phentermine 37.5 mg tablet  TK 1 T PO QD    . predniSONE (DELTASONE) 20 MG tablet prednisone 20 mg tablet  TK 3 TS PO ON DAY 1 THEN  TK 2 TS ONCE D FOR DAYS 2 THROUGH 5    . Prenatal Vit-Fe Fumarate-FA (PRENATAL MULTIVITAMIN) TABS tablet Take 1 tablet by mouth at bedtime. (Patient not taking: Reported on 07/09/2018) 100 tablet 3  . sertraline (ZOLOFT) 50 MG tablet sertraline 50 mg tablet  TK 1 T PO BID UTD     No current facility-administered medications for this visit.     REVIEW OF SYSTEMS:  [X]  denotes positive finding, [ ]  denotes negative finding Cardiac  Comments:  Chest pain or chest pressure:    Shortness of breath upon exertion:    Short of breath when lying flat:    Irregular heart rhythm:  Vascular    Pain in calf, thigh, or hip brought on by ambulation:    Pain in feet at night that wakes you up from your sleep:     Blood clot in your veins:    Leg swelling:         Pulmonary    Oxygen at home:    Productive cough:     Wheezing:         Neurologic    Sudden weakness in arms or legs:     Sudden numbness in arms or legs:     Sudden onset of difficulty speaking or slurred speech:    Temporary loss of vision in one eye:     Problems with dizziness:         Gastrointestinal    Blood in stool:     Vomited blood:         Genitourinary    Burning when urinating:     Blood in urine:        Psychiatric    Major depression:         Hematologic    Bleeding problems:    Problems with blood clotting too easily:        Skin    Rashes or ulcers:        Constitutional    Fever or chills:     PHYSICAL EXAM:   Vitals:   07/09/18 1352  BP: 109/73  Pulse: 87  Resp: 18  Temp: 98.7 F (37.1 C)  TempSrc: Oral  SpO2: 97%  Weight: 206 lb (93.4 kg)  Height: 5\' 2"  (1.575 m)   GENERAL: The patient is a well-nourished female, in no acute distress. The vital signs are documented above. CARDIAC: There is a regular rate and rhythm.  VASCULAR: I do not detect carotid bruits. She has palpable pedal pulses bilaterally. VENOUS EXAM: She does have some spider veins and reticular veins  bilaterally.  She does not have any large truncal varicosities.  She has mild bilateral lower extremity swelling.  She currently does not have any significant hyperpigmentation. I did look at the saphenous vein myself with the SonoSite and the vein is dilated with reflux from the saphenofemoral junction to the mid calf. PULMONARY: There is good air exchange bilaterally without wheezing or rales. ABDOMEN: Soft and non-tender with normal pitched bowel sounds.  MUSCULOSKELETAL: There are no major deformities or cyanosis. NEUROLOGIC: No focal weakness or paresthesias are detected. SKIN: There are no ulcers or rashes noted. PSYCHIATRIC: The patient has a normal affect.  DATA:    VENOUS DUPLEX: I have independently interpreted her venous duplex scan today.  On the right side there is no evidence of DVT or superficial thrombophlebitis.  There is no significant deep venous reflux.  There is reflux in the right great saphenous vein from the saphenofemoral junction to the proximal calf.  The vein is significantly dilated with diameters ranging from 0.5-0.65 cm.  On the left side there is no evidence of DVT or superficial thrombophlebitis.  There is reflux in the great saphenous vein in the thigh but not at the saphenofemoral junction.  The vein is not especially enlarged.

## 2018-07-14 ENCOUNTER — Other Ambulatory Visit: Payer: Self-pay | Admitting: *Deleted

## 2018-07-14 DIAGNOSIS — I83811 Varicose veins of right lower extremities with pain: Secondary | ICD-10-CM

## 2018-07-19 ENCOUNTER — Emergency Department (HOSPITAL_COMMUNITY): Payer: Medicaid Other

## 2018-07-19 ENCOUNTER — Encounter (HOSPITAL_COMMUNITY): Payer: Self-pay | Admitting: *Deleted

## 2018-07-19 ENCOUNTER — Emergency Department (HOSPITAL_COMMUNITY)
Admission: EM | Admit: 2018-07-19 | Discharge: 2018-07-19 | Disposition: A | Discharge status: Home / Self Care | Payer: Medicaid Other | Attending: Emergency Medicine | Admitting: Emergency Medicine

## 2018-07-19 DIAGNOSIS — E039 Hypothyroidism, unspecified: Secondary | ICD-10-CM | POA: Diagnosis not present

## 2018-07-19 DIAGNOSIS — R109 Unspecified abdominal pain: Secondary | ICD-10-CM

## 2018-07-19 DIAGNOSIS — R1031 Right lower quadrant pain: Secondary | ICD-10-CM | POA: Diagnosis present

## 2018-07-19 DIAGNOSIS — Z79899 Other long term (current) drug therapy: Secondary | ICD-10-CM | POA: Diagnosis not present

## 2018-07-19 LAB — COMPREHENSIVE METABOLIC PANEL
ALT: 15 U/L (ref 0–44)
AST: 19 U/L (ref 15–41)
Albumin: 4 g/dL (ref 3.5–5.0)
Alkaline Phosphatase: 52 U/L (ref 38–126)
Anion gap: 8 (ref 5–15)
BUN: 10 mg/dL (ref 6–20)
CO2: 24 mmol/L (ref 22–32)
Calcium: 8.7 mg/dL — ABNORMAL LOW (ref 8.9–10.3)
Chloride: 106 mmol/L (ref 98–111)
Creatinine, Ser: 0.58 mg/dL (ref 0.44–1.00)
GFR calc Af Amer: >60 mL/min (ref >60)
GFR calc non Af Amer: >60 mL/min (ref >60)
Glucose, Bld: 96 mg/dL (ref 70–99)
Potassium: 4 mmol/L (ref 3.5–5.1)
Sodium: 138 mmol/L (ref 135–145)
Total Bilirubin: 0.5 mg/dL (ref 0.3–1.2)
Total Protein: 7.6 g/dL (ref 6.5–8.1)

## 2018-07-19 LAB — URINALYSIS, ROUTINE W REFLEX MICROSCOPIC
Bilirubin Urine: NEGATIVE
GLUCOSE, UA: NEGATIVE mg/dL
Hgb urine dipstick: NEGATIVE
KETONES UR: NEGATIVE mg/dL
LEUKOCYTE UA: NEGATIVE
NITRITE: NEGATIVE
PH: 6 (ref 5.0–8.0)
PROTEIN: NEGATIVE mg/dL
Specific Gravity, Urine: 1.011 (ref 1.005–1.030)

## 2018-07-19 LAB — WET PREP, GENITAL
Clue Cells Wet Prep HPF POC: NONE SEEN
Sperm: NONE SEEN
Trich, Wet Prep: NONE SEEN
Yeast Wet Prep HPF POC: NONE SEEN

## 2018-07-19 LAB — CBC WITH DIFFERENTIAL/PLATELET
Abs Immature Granulocytes: 0 10*3/uL (ref 0.00–0.07)
Basophils Absolute: 0 10*3/uL (ref 0.0–0.1)
Basophils Relative: 1 %
Eosinophils Absolute: 0.1 10*3/uL (ref 0.0–0.5)
Eosinophils Relative: 2 %
HCT: 44.1 % (ref 36.0–46.0)
Hemoglobin: 14.3 g/dL (ref 12.0–15.0)
Immature Granulocytes: 0 %
Lymphocytes Relative: 42 %
Lymphs Abs: 2.2 10*3/uL (ref 0.7–4.0)
MCH: 29.1 pg (ref 26.0–34.0)
MCHC: 32.4 g/dL (ref 30.0–36.0)
MCV: 89.6 fL (ref 80.0–100.0)
Monocytes Absolute: 0.3 10*3/uL (ref 0.1–1.0)
Monocytes Relative: 6 %
Neutro Abs: 2.6 10*3/uL (ref 1.7–7.7)
Neutrophils Relative %: 49 %
Platelets: 247 10*3/uL (ref 150–400)
RBC: 4.92 MIL/uL (ref 3.87–5.11)
RDW: 13.4 % (ref 11.5–15.5)
WBC: 5.2 10*3/uL (ref 4.0–10.5)
nRBC: 0 % (ref 0.0–0.2)

## 2018-07-19 MED ORDER — SODIUM CHLORIDE (PF) 0.9 % IJ SOLN
INTRAMUSCULAR | Status: AC
  Filled 2018-07-19: qty 50

## 2018-07-19 MED ORDER — ONDANSETRON HCL 4 MG PO TABS: 4.0000 mg | ORAL_TABLET | Freq: Four times a day (QID) | ORAL | 0 refills | Status: DC

## 2018-07-19 MED ORDER — LIDOCAINE HCL (PF) 1 % IJ SOLN
2.0000 mL | Freq: Once | INTRAMUSCULAR | Status: AC
  Administered 2018-07-19: 2 mL
  Filled 2018-07-19: qty 30

## 2018-07-19 MED ORDER — IOPAMIDOL (ISOVUE-300) INJECTION 61%
100.0000 mL | Freq: Once | INTRAVENOUS | Status: AC | PRN
  Administered 2018-07-19: 100 mL via INTRAVENOUS

## 2018-07-19 MED ORDER — CEFTRIAXONE SODIUM 250 MG IJ SOLR
250.0000 mg | Freq: Once | INTRAMUSCULAR | Status: AC
  Administered 2018-07-19: 250 mg via INTRAMUSCULAR
  Filled 2018-07-19: qty 250

## 2018-07-19 MED ORDER — MORPHINE SULFATE (PF) 4 MG/ML IV SOLN: 4.0000 mg | Freq: Once | INTRAVENOUS | Status: DC

## 2018-07-19 MED ORDER — IOPAMIDOL (ISOVUE-300) INJECTION 61%
INTRAVENOUS | Status: AC
  Filled 2018-07-19: qty 100

## 2018-07-19 MED ORDER — DOXYCYCLINE HYCLATE 100 MG PO CAPS: 100.0000 mg | ORAL_CAPSULE | Freq: Two times a day (BID) | ORAL | 0 refills | Status: AC

## 2018-07-19 MED ORDER — ONDANSETRON HCL 4 MG/2ML IJ SOLN
4.0000 mg | Freq: Once | INTRAMUSCULAR | Status: AC
  Administered 2018-07-19: 4 mg via INTRAVENOUS
  Filled 2018-07-19: qty 2

## 2018-07-19 MED ORDER — MORPHINE SULFATE (PF) 4 MG/ML IV SOLN
4.0000 mg | Freq: Once | INTRAVENOUS | Status: AC
  Administered 2018-07-19: 4 mg via INTRAVENOUS
  Filled 2018-07-19: qty 1

## 2018-07-19 MED ORDER — DOXYCYCLINE HYCLATE 100 MG PO TABS
100.0000 mg | ORAL_TABLET | Freq: Once | ORAL | Status: AC
  Administered 2018-07-19: 100 mg via ORAL
  Filled 2018-07-19: qty 1

## 2018-07-19 MED ORDER — SODIUM CHLORIDE 0.9 % IV BOLUS
500.0000 mL | Freq: Once | INTRAVENOUS | Status: AC
  Administered 2018-07-19: 500 mL via INTRAVENOUS

## 2018-07-19 MED ORDER — KETOROLAC TROMETHAMINE 30 MG/ML IJ SOLN
30.0000 mg | Freq: Once | INTRAMUSCULAR | Status: AC
  Administered 2018-07-19: 30 mg via INTRAVENOUS
  Filled 2018-07-19: qty 1

## 2018-07-19 MED ORDER — IBUPROFEN 600 MG PO TABS: 600.0000 mg | ORAL_TABLET | Freq: Four times a day (QID) | ORAL | 0 refills | Status: DC | PRN

## 2018-07-19 MED ORDER — HYDROCODONE-ACETAMINOPHEN 5-325 MG PO TABS: 1.0000 | ORAL_TABLET | Freq: Four times a day (QID) | ORAL | 0 refills | Status: DC | PRN

## 2018-07-19 NOTE — ED Notes (Signed)
Bed: WTR5 Expected date:  Expected time:  Means of arrival:  Comments: 

## 2018-07-19 NOTE — ED Triage Notes (Signed)
Pt complains of right lower flank pain x 4 days. Pt has also noticed increased urinary frequency. Pain is worse with movement.

## 2018-07-19 NOTE — Discharge Instructions (Signed)
We have not found a specific reason for your pain today other than possibly pelvic inflammatory disease.  We will treat this with antibiotics in case.  Take doxycycline until completed.  You will be called in 3 days if you test positive for gonorrhea or chlamydia.  If positive, please make sure your sexual partner(s) are treated as well.  For pain, he can take ibuprofen every 6 hours as needed.  For breakthrough pain, take 1-2 Norco every 6 hours as needed.  Do not drive or operate machinery while taking this medication.  Take Zofran every 6 hours as needed for nausea or vomiting.  Please follow-up as soon as possible with your OB/GYN by calling them tomorrow.  Please return to the emergency department if you develop any new or worsening symptoms including worsening pain, intractable vomiting, persistent fever 100.4, or any other new or concerning symptoms.  Do not drink alcohol, drive, operate machinery or participate in any other potentially dangerous activities while taking opiate pain medication as it may make you sleepy. Do not take this medication with any other sedating medications, either prescription or over-the-counter. If you were prescribed Percocet or Vicodin, do not take these with acetaminophen (Tylenol) as it is already contained within these medications and overdose of Tylenol is dangerous.   This medication is an opiate (or narcotic) pain medication and can be habit forming.  Use it as little as possible to achieve adequate pain control.  Do not use or use it with extreme caution if you have a history of opiate abuse or dependence. This medication is intended for your use only - do not give any to anyone else and keep it in a secure place where nobody else, especially children, have access to it. It will also cause or worsen constipation, so you may want to consider taking an over-the-counter stool softener while you are taking this medication.

## 2018-07-19 NOTE — ED Provider Notes (Signed)
[facility]-EMERGENCY DEPT Provider Note   CSN: [id number] Arrival date & time: 07/19/18  1024    History   Chief Complaint Chief Complaint  Patient presents with   Flank Pain    HPI Carol Welch is a 43 y.o. female with history of hypothyroidism who presents with a 4-day history of abdominal pain and right flank pain.  Patient describes the pain is constant.  She is unable to describe the character of the pain.  She has had associated nausea, but no vomiting.  She has had urinary frequency and urgency, although she states she has been drinking a lot of water.  She denies any recent injury or heavy lifting.  She denies any saddle anesthesia, loss of bowel or bladder control, numbness or tingling.  Patient is taken ibuprofen and Tylenol without significant relief.  She reports some intermittent vaginal discharge, but it is not every time.  She denies any concern for STD exposure.  She denies any abnormal vaginal bleeding.  Patient has irregular menstrual cycles, LMP 04/27/2018.  Patient was seen at urgent care PTA and had negative Upreg and negative UA.  She was sent here for further evaluation.  Patient has no history of kidney stones.  She has never had any abdominal surgeries.     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 07/06/2018   Hypothyroidism 07/06/2018   Multigravida of advanced maternal age 30/06/2018   Indication for care in labor or delivery 03/24/2017   Spontaneous vaginal delivery 03/24/2017   Postpartum care following vaginal delivery 03/24/2017   Traumatic injury during pregnancy, antepartum, second trimester 12/04/2016   Family history of congenital anomaly 08/27/2016   Labor and delivery, indication for care  06/30/2015   SVD (spontaneous vaginal delivery) 06/30/2015   Incompetent cervix during second trimester, antepartum 03/19/2015   Preterm contractions 03/18/2015    Past Surgical History:  Procedure Laterality Date   NO PAST SURGERIES       OB History    Gravida  6   Para  5   Term  5   Preterm  0   AB  1   Living  5     SAB  1   TAB  0   Ectopic  0   Multiple  0   Live Births  2            Home Medications    Prior to Admission medications   Medication Sig Start Date End Date Taking? Authorizing Provider  acetaminophen (TYLENOL) 500 MG tablet Take 1,000 mg by mouth every 6 (six) hours as needed for mild pain, moderate pain, fever or headache.     [provider]  doxycycline (VIBRAMYCIN) 100 MG capsule Take 1 capsule (100 mg total) by mouth 2 (two) times daily for 14 days. 07/19/18 08/02/18  Emi Holes, PA-C  drospirenone-ethinyl estradiol (YAZ,GIANVI,LORYNA) 3-0.02 MG tablet TK 1 T PO QD UTD 06/08/18   [provider]  HYDROcodone-acetaminophen (NORCO/VICODIN) 5-325 MG tablet Take 1-2 tablets by mouth every 6 (six) hours as needed for severe pain. 07/19/18   Mckay Brandt, Waylan Boga, PA-C  ibuprofen (ADVIL,MOTRIN) 600 MG tablet Take 1 tablet (600 mg total) by mouth every 6 (six) hours as needed. 07/19/18   Braylen Staller,  Waylan Boga, PA-C  IRON PO Take 2 tablets daily by mouth. Over the counter, not sure of dose    [provider]  levothyroxine (SYNTHROID, LEVOTHROID) 100 MCG tablet Take 100 mcg by mouth daily before breakfast.     [provider]  medroxyPROGESTERone (DEPO-PROVERA) 150 MG/ML injection medroxyprogesterone 150 mg/mL intramuscular suspension  Inject 1 mL every 3 months by intramuscular route.    [provider]  medroxyPROGESTERone Acetate 150 MG/ML SUSY medroxyprogesterone 150 mg/mL intramuscular syringe  INJECT 1 ML EVERY 3 MONTHS    [provider]  nystatin-triamcinolone (MYCOLOG II) cream  nystatin-triamcinolone 100,000 unit/g-0.1 % topical cream  APPLY SMALL AMOUNT TO THE EXTERNAL AFFECTED AREAS BID PRN    [provider]  ondansetron (ZOFRAN) 4 MG tablet Take 1 tablet (4 mg total) by mouth every 6 (six) hours. 07/19/18   Lura Falor, Waylan Boga, PA-C  phentermine (ADIPEX-P) 37.5 MG tablet phentermine 37.5 mg tablet  TK 1 T PO QD    [provider]  predniSONE (DELTASONE) 20 MG tablet prednisone 20 mg tablet  TK 3 TS PO ON DAY 1 THEN TK 2 TS ONCE D FOR DAYS 2 THROUGH 5    [provider]  Prenatal Vit-Fe Fumarate-FA (PRENATAL MULTIVITAMIN) TABS tablet Take 1 tablet by mouth at bedtime. Patient not taking: Reported on 07/09/2018 03/26/17   Bovard-Stuckert, Augusto Gamble, MD  sertraline (ZOLOFT) 50 MG tablet sertraline 50 mg tablet  TK 1 T PO BID UTD    [provider]  terbinafine (LAMISIL) 250 MG tablet Take 1 tablet (250 mg total) by mouth daily. 07/06/18   Vivi Barrack, DPM    Family History Family History  Problem Relation Age of Onset   Heart disease Father    Cancer Maternal Aunt    Cancer Maternal Grandmother    Heart disease Paternal Grandfather     Social History Social History   Tobacco Use   Smoking status: Never Smoker   Smokeless tobacco: Never Used  Substance Use Topics   Alcohol use: No   Drug use: No     Allergies   Patient has no known allergies.   Review of Systems Review of Systems  Constitutional: Positive for fever (subjective). Negative for chills.  HENT: Negative for facial swelling and sore throat.   Respiratory: Negative for shortness of breath.   Cardiovascular: Negative for chest pain.  Gastrointestinal: Positive for abdominal pain and nausea. Negative for blood in stool, diarrhea and vomiting.  Genitourinary: Positive for flank pain. Negative for dysuria.  Musculoskeletal: Positive for back pain.  Skin: Negative for rash and wound.  Neurological: Negative for headaches.  Psychiatric/Behavioral:  The patient is not nervous/anxious.      Physical Exam Updated Vital Signs BP 124/78    Pulse (!) 58    Temp 97.8 F (36.6 C) (Oral)    Resp 18    LMP 04/27/2018    SpO2 99%   Physical Exam Vitals signs and nursing note reviewed. Exam conducted with a chaperone present.  Constitutional:      General: She is not in acute distress.    Appearance: She is well-developed. She is not diaphoretic.     Comments: Uncomfortable appearing  HENT:     Head: Normocephalic and atraumatic.     Mouth/Throat:     Pharynx: No oropharyngeal exudate.  Eyes:     General: No scleral icterus.       Right eye: No discharge.  Left eye: No discharge.     Conjunctiva/sclera: Conjunctivae normal.     Pupils: Pupils are equal, round, and reactive to light.  Neck:     Musculoskeletal: Normal range of motion and neck supple.     Thyroid: No thyromegaly.  Cardiovascular:     Rate and Rhythm: Normal rate and regular rhythm.     Heart sounds: Normal heart sounds. No murmur. No friction rub. No gallop.   Pulmonary:     Effort: Pulmonary effort is normal. No respiratory distress.     Breath sounds: Normal breath sounds. No stridor. No wheezing or rales.  Abdominal:     General: Bowel sounds are normal. There is no distension.     Palpations: Abdomen is soft.     Tenderness: There is abdominal tenderness in the right lower quadrant and suprapubic area. There is right CVA tenderness. There is no guarding or rebound. Positive signs include McBurney's sign.    Genitourinary:    Vagina: Vaginal discharge (white/clear, could be phisiologic) present.     Cervix: Cervical motion tenderness present.     Uterus: Tender.      Adnexa:        Right: Tenderness (R>L) present.        Left: Tenderness present.   Musculoskeletal:       Back:  Lymphadenopathy:     Cervical: No cervical adenopathy.  Skin:    General: Skin is warm and dry.     Coloration: Skin is not pale.     Findings: No rash.  Neurological:       Mental Status: She is alert.     Coordination: Coordination normal.      ED Treatments / Results  Labs (all labs ordered are listed, but only abnormal results are displayed) Labs Reviewed  WET PREP, GENITAL - Abnormal; Notable for the following components:      Result Value   WBC, Wet Prep HPF POC MANY (*)    All other components within normal limits  URINALYSIS, ROUTINE W REFLEX MICROSCOPIC - Abnormal; Notable for the following components:   Color, Urine STRAW (*)    All other components within normal limits  COMPREHENSIVE METABOLIC PANEL - Abnormal; Notable for the following components:   Calcium 8.7 (*)    All other components within normal limits  CBC WITH DIFFERENTIAL/PLATELET  GC/CHLAMYDIA PROBE AMP ([place]) NOT AT [facility]    EKG None  Radiology US Transvaginal Non-ob  Result Date: 07/19/2018 CLINICAL DATA:  Right lower quadrant abdominal pain. EXAM: TRANSABDOMINAL AND TRANSVAGINAL ULTRASOUND OF PELVIS DOPPLER ULTRASOUND OF OVARIES TECHNIQUE: Both transabdominal and transvaginal ultrasound examinations of the pelvis were performed. Transabdominal technique was performed for global imaging of the pelvis including uterus, ovaries, adnexal regions, and pelvic cul-de-sac. It was necessary to proceed with endovaginal exam following the transabdominal exam to visualize the adnexal structures. Color and duplex Doppler ultrasound was utilized to evaluate blood flow to the ovaries. COMPARISON:  None. FINDINGS: Uterus Measurements: 9.5 x 4.9 x 6.3 cm = volume: 156.7 mL. No fibroids or other mass visualized. Endometrium Thickness: 2 mm.  No focal abnormality visualized. Right ovary Not visualized. Left ovary Measurements: 5.1 x 2.7 x 3.1 cm = volume: 22.6 mL. Within the left ovary there is a 3.1 x 2.2 x 2.5 cm hypoechoic lesion, potentially representing a prominent follicle. Pulsed Doppler evaluation of the left ovary demonstrates normal low-resistance arterial and venous waveforms.  Other findings No abnormal free fluid. IMPRESSION: The right ovary is not  visualized and therefore not assessed. No sonographic evidence to suggest torsion involving the left ovary. Prominent probable follicle left ovary. Electronically Signed   By: Annia Belt M.D.   On: 07/19/2018 15:17   US Pelvis Complete  Result Date: 07/19/2018 CLINICAL DATA:  Right lower quadrant abdominal pain. EXAM: TRANSABDOMINAL AND TRANSVAGINAL ULTRASOUND OF PELVIS DOPPLER ULTRASOUND OF OVARIES TECHNIQUE: Both transabdominal and transvaginal ultrasound examinations of the pelvis were performed. Transabdominal technique was performed for global imaging of the pelvis including uterus, ovaries, adnexal regions, and pelvic cul-de-sac. It was necessary to proceed with endovaginal exam following the transabdominal exam to visualize the adnexal structures. Color and duplex Doppler ultrasound was utilized to evaluate blood flow to the ovaries. COMPARISON:  None. FINDINGS: Uterus Measurements: 9.5 x 4.9 x 6.3 cm = volume: 156.7 mL. No fibroids or other mass visualized. Endometrium Thickness: 2 mm.  No focal abnormality visualized. Right ovary Not visualized. Left ovary Measurements: 5.1 x 2.7 x 3.1 cm = volume: 22.6 mL. Within the left ovary there is a 3.1 x 2.2 x 2.5 cm hypoechoic lesion, potentially representing a prominent follicle. Pulsed Doppler evaluation of the left ovary demonstrates normal low-resistance arterial and venous waveforms. Other findings No abnormal free fluid. IMPRESSION: The right ovary is not visualized and therefore not assessed. No sonographic evidence to suggest torsion involving the left ovary. Prominent probable follicle left ovary. Electronically Signed   By: Annia Belt M.D.   On: 07/19/2018 15:17   Ct Abdomen Pelvis W Contrast  Result Date: 07/19/2018 CLINICAL DATA:  Right flank and right lower quadrant pain EXAM: CT ABDOMEN AND PELVIS WITH CONTRAST TECHNIQUE: Multidetector CT imaging of the abdomen and  pelvis was performed using the standard protocol following bolus administration of intravenous contrast. CONTRAST:  ISOVUE-300 IOPAMIDOL (ISOVUE-300) INJECTION 61% COMPARISON:  May 24, 2014 FINDINGS: Lower chest: There is mild bibasilar atelectasis. There is no edema or consolidation in the base regions. Hepatobiliary: There are scattered subcentimeter apparent cysts in the liver. No other liver lesions are evident. Gallbladder wall is not appreciably thickened. There is no biliary duct dilatation. Pancreas: No pancreatic mass or inflammatory focus evident. Spleen: No splenic lesions are appreciable. Adrenals/Urinary Tract: Adrenals bilaterally appear normal. Kidneys bilaterally show no evident mass or hydronephrosis on either side. There is no appreciable renal or ureteral calculus on either side. Urinary bladder is midline with wall thickness within normal limits. Stomach/Bowel: There is no appreciable bowel wall or mesenteric thickening. There is no evident bowel obstruction. There is no free air or portal venous air. Vascular/Lymphatic: There is no abdominal aortic aneurysm. No vascular lesions are appreciable. There is no adenopathy in the abdomen or pelvis. Reproductive: The uterus is anteverted. There is an apparent dominant follicle in the left ovary measuring 2.3 x 2.2 cm. Beyond this follicle, there is no evident pelvic mass. Other: The appendix appears normal. There is no abscess or ascites in the abdomen or pelvis. There is a minimal ventral hernia containing only fat. Musculoskeletal: There is no blastic or lytic bone lesion. There is no intramuscular or abdominal wall lesion. IMPRESSION: 1. A cause for patient's symptoms has not established with this study. 2. No bowel obstruction. No abscess in the abdomen or pelvis. Appendix appears normal. 3. No evident renal or ureteral calculus. No hydronephrosis. Urinary bladder wall thickness is within normal limits. 4.  There is a minimal ventral  hernia containing only fat. Electronically Signed   By: Bretta Bang III M.D.   On: 07/19/2018 13:42  [place] Art/ven Flow Abd Pelv Doppler  Result Date: 07/19/2018 CLINICAL DATA:  Right lower quadrant abdominal pain. EXAM: TRANSABDOMINAL AND TRANSVAGINAL ULTRASOUND OF PELVIS DOPPLER ULTRASOUND OF OVARIES TECHNIQUE: Both transabdominal and transvaginal ultrasound examinations of the pelvis were performed. Transabdominal technique was performed for global imaging of the pelvis including uterus, ovaries, adnexal regions, and pelvic cul-de-sac. It was necessary to proceed with endovaginal exam following the transabdominal exam to visualize the adnexal structures. Color and duplex Doppler ultrasound was utilized to evaluate blood flow to the ovaries. COMPARISON:  None. FINDINGS: Uterus Measurements: 9.5 x 4.9 x 6.3 cm = volume: 156.7 mL. No fibroids or other mass visualized. Endometrium Thickness: 2 mm.  No focal abnormality visualized. Right ovary Not visualized. Left ovary Measurements: 5.1 x 2.7 x 3.1 cm = volume: 22.6 mL. Within the left ovary there is a 3.1 x 2.2 x 2.5 cm hypoechoic lesion, potentially representing a prominent follicle. Pulsed Doppler evaluation of the left ovary demonstrates normal low-resistance arterial and venous waveforms. Other findings No abnormal free fluid. IMPRESSION: The right ovary is not visualized and therefore not assessed. No sonographic evidence to suggest torsion involving the left ovary. Prominent probable follicle left ovary. Electronically Signed   By: Annia Belt M.D.   On: 07/19/2018 15:17    Procedures Procedures (including critical care time)  Medications Ordered in ED Medications  iopamidol (ISOVUE-300) 61 % injection (has no administration in time range)  sodium chloride (PF) 0.9 % injection (has no administration in time range)  ondansetron (ZOFRAN) injection 4 mg (4 mg Intravenous Given 07/19/18 1111)  morphine 4 MG/ML injection 4 mg (4 mg Intravenous  Given 07/19/18 1111)  sodium chloride 0.9 % bolus 500 mL (0 mLs Intravenous Stopped 07/19/18 1249)  iopamidol (ISOVUE-300) 61 % injection 100 mL (100 mLs Intravenous Contrast Given 07/19/18 1308)  ketorolac (TORADOL) 30 MG/ML injection 30 mg (30 mg Intravenous Given 07/19/18 1402)  cefTRIAXone (ROCEPHIN) injection 250 mg (250 mg Intramuscular Given 07/19/18 1603)  doxycycline (VIBRA-TABS) tablet 100 mg (100 mg Oral Given 07/19/18 1603)  morphine 4 MG/ML injection 4 mg (4 mg Intravenous Given 07/19/18 1603)  lidocaine (PF) (XYLOCAINE) 1 % injection 2 mL (2 mLs Other Given 07/19/18 1607)     Initial Impression / Assessment and Plan / ED Course  I have reviewed the triage vital signs and the nursing notes.  Pertinent labs & imaging results that were available during my care of the patient were reviewed by me and considered in my medical decision making (see chart for details).        Patient presenting with right flank pain right lower quadrant pain, and suprapubic pain.  Labs are unremarkable.  Wet prep shows many WBCs.  UA is completely negative.  CT abdomen pelvis shows no cause for the patient's symptoms.  Appendix is normal.  Pelvic ultrasound shows no sonographic evidence to suggest torsion involving the left ovary and a prominent probable follicle in the left ovary; however, the right ovary was not visualized.  I discussed this situation with Dr. Despina Hidden, OB/GYN on-call, who advised torsion or TOA was very unlikely, as there would be other associated findings on imaging.  Considering patient's CMT and adnexal tenderness, will cover for PID, although patient says she has little concern for STD exposure.  IM Rocephin given in the ED.  Doxycycline initiated.  Patient given strict return precautions and advised to follow-up with OB/GYN this week.  Will discharge home with short course of Vicodin as well as ibuprofen, Zofran.  Patient understands and agrees with plan.  Patient vitals stable throughout ED  course and discharged in satisfactory condition.  Final Clinical Impressions(s) / ED Diagnoses   Final diagnoses:  Right flank pain  Right lower quadrant abdominal pain    ED Discharge Orders         Ordered    HYDROcodone-acetaminophen (NORCO/VICODIN) 5-325 MG tablet  Every 6 hours PRN     07/19/18 1603    ibuprofen (ADVIL,MOTRIN) 600 MG tablet  Every 6 hours PRN     07/19/18 1603    ondansetron (ZOFRAN) 4 MG tablet  Every 6 hours     07/19/18 1603    doxycycline (VIBRAMYCIN) 100 MG capsule  2 times daily     07/19/18 [address], PA-C 07/19/18 1800    Terrilee Files, MD 07/20/18 [id number]

## 2018-07-20 LAB — GC/CHLAMYDIA PROBE AMP (~~LOC~~) NOT AT ARMC
Chlamydia: NEGATIVE
Neisseria Gonorrhea: NEGATIVE

## 2018-08-06 ENCOUNTER — Other Ambulatory Visit: Payer: No Typology Code available for payment source | Admitting: Vascular Surgery

## 2018-08-10 ENCOUNTER — Ambulatory Visit: Payer: Medicaid Other | Admitting: Podiatry

## 2018-08-11 ENCOUNTER — Other Ambulatory Visit: Payer: Self-pay

## 2018-08-11 ENCOUNTER — Telehealth (INDEPENDENT_AMBULATORY_CARE_PROVIDER_SITE_OTHER): Payer: Medicaid Other | Admitting: Podiatry

## 2018-08-11 DIAGNOSIS — Z79899 Other long term (current) drug therapy: Secondary | ICD-10-CM

## 2018-08-11 DIAGNOSIS — B351 Tinea unguium: Secondary | ICD-10-CM | POA: Diagnosis not present

## 2018-08-11 DIAGNOSIS — B353 Tinea pedis: Secondary | ICD-10-CM

## 2018-08-11 MED ORDER — CLOTRIMAZOLE-BETAMETHASONE 1-0.05 % EX CREA: 1.0000 "application " | TOPICAL_CREAM | Freq: Two times a day (BID) | CUTANEOUS | 0 refills | Status: DC

## 2018-08-11 NOTE — Patient Instructions (Signed)

## 2018-08-13 NOTE — Progress Notes (Signed)
Virtual Visit via Video Note  I connected with Carol Welch on 08/13/18 at 11:50 AM EDT by a video enabled telemedicine application and verified that I am speaking with the correct person using two identifiers.   I discussed the limitations of evaluation and management by telemedicine and the availability of in person appointments. The patient expressed understanding and agreed to proceed.  She as at home and I was in the office at the time of this [facility] visit.   History of Present Illness: 43 year old female for follow-up evaluation after starting Lamisil.  She denies any side effects medication she is tolerating well but any issues at this point.  She had a headache for the first couple days but that resolved.  She states that this point she has not yet noticed significant improvement in the nails.  She actually has noticed a rash form with small blisters present between her toes at times.  She states that it does itch and she denies any redness or red streaks she states there is clear type blisters that formed and it itches.  No red streaking.  No pain.   Observations/Objective: Overall the tendons appear to be doing about the same.  There is still discoloration present of the toenails.Discoloration of the right fifth toe she is also on the yellow to brown discoloration of the hallux toenails also in the left side.  There is no pain of the nails there is no redness or drainage or signs of infection.  There does appear to be a slight erythematous rash on the dorsal aspect of the foot with small blister present but there are no red streaks.  There is no pain when she presses and subjectively she states that it itches.  Assessment and Plan:  Onychomycosis, tinea pedis  We discussed the success rates of Lamisil again today.  We discussed the duration of use of the Lamisil.  At this point I would not expect to see significant improvement in the nails but hopefully over the next several weeks she  will start to see improvement.  I also prescribed Lotrisone cream.  If not covered by insurance and will consider a steroid cream to this area.  We also discussed warm water with a small amount of vinegar to help.  Continue monitoring side effects of medication.  Follow Up Instructions: RTC 2 months or sooner if needed    I discussed the assessment and treatment plan with the patient. The patient was provided an opportunity to ask questions and all were answered. The patient agreed with the plan and demonstrated an understanding of the instructions.   The patient was advised to call back or seek an in-person evaluation if the symptoms worsen or if the condition fails to improve as anticipated.  I provided 7 minutes of non-face-to-face time during this encounter.   Vivi Barrack, DPM

## 2018-08-20 ENCOUNTER — Encounter (HOSPITAL_COMMUNITY): Payer: No Typology Code available for payment source

## 2018-08-20 ENCOUNTER — Ambulatory Visit: Payer: No Typology Code available for payment source | Admitting: Vascular Surgery

## 2018-08-27 LAB — CBC WITH DIFFERENTIAL/PLATELET
Absolute Monocytes: 390 cells/uL (ref 200–950)
Basophils Absolute: 38 cells/uL (ref 0–200)
Basophils Relative: 0.6 %
Eosinophils Absolute: 70 cells/uL (ref 15–500)
Eosinophils Relative: 1.1 %
HCT: 39.1 % (ref 35.0–45.0)
Hemoglobin: 13.2 g/dL (ref 11.7–15.5)
Lymphs Abs: 2470 cells/uL (ref 850–3900)
MCH: 29.4 pg (ref 27.0–33.0)
MCHC: 33.8 g/dL (ref 32.0–36.0)
MCV: 87.1 fL (ref 80.0–100.0)
MPV: 10.4 fL (ref 7.5–12.5)
Monocytes Relative: 6.1 %
Neutro Abs: 3430 cells/uL (ref 1500–7800)
Neutrophils Relative %: 53.6 %
Platelets: 249 10*3/uL (ref 140–400)
RBC: 4.49 10*6/uL (ref 3.80–5.10)
RDW: 13.1 % (ref 11.0–15.0)
Total Lymphocyte: 38.6 %
WBC: 6.4 10*3/uL (ref 3.8–10.8)

## 2018-08-27 LAB — HEPATIC FUNCTION PANEL
AG Ratio: 1.5 (calc) (ref 1.0–2.5)
ALT: 10 U/L (ref 6–29)
AST: 15 U/L (ref 10–30)
Albumin: 3.8 g/dL (ref 3.6–5.1)
Alkaline phosphatase (APISO): 43 U/L (ref 31–125)
Bilirubin, Direct: 0.1 mg/dL (ref 0.0–0.2)
Globulin: 2.6 g/dL (calc) (ref 1.9–3.7)
Indirect Bilirubin: 0.2 mg/dL (calc) (ref 0.2–1.2)
Total Bilirubin: 0.3 mg/dL (ref 0.2–1.2)
Total Protein: 6.4 g/dL (ref 6.1–8.1)

## 2018-08-28 ENCOUNTER — Telehealth: Payer: Self-pay | Admitting: *Deleted

## 2018-08-28 NOTE — Telephone Encounter (Signed)
Left message informing pt of Dr. Wagoner's review of results and orders. 

## 2018-08-28 NOTE — Telephone Encounter (Signed)
-----   Message from Vivi Barrack, DPM sent at 08/27/2018  5:29 PM EDT ----- Val- please let her know that the blood work is normal and she can continue Lamisil. Thanks.

## 2018-09-01 ENCOUNTER — Telehealth: Payer: Self-pay | Admitting: Podiatry

## 2018-09-01 NOTE — Telephone Encounter (Signed)
Yes, she needs to stop and be seen today for this.

## 2018-09-01 NOTE — Telephone Encounter (Signed)
Pt called and stated that she is having issues with the medication Dr Ardelle Anton gave her. Rectal bleeding and is there some other medication she should have.

## 2018-09-01 NOTE — Telephone Encounter (Signed)
I called pt and told her to stop the lamisil and to go to PCP. Pt states her PCP is no longer on [facility]. I suggested go to an Urgent Care facility that had family practice.

## 2018-09-17 ENCOUNTER — Other Ambulatory Visit: Payer: Self-pay | Admitting: *Deleted

## 2018-09-17 DIAGNOSIS — I83811 Varicose veins of right lower extremities with pain: Secondary | ICD-10-CM

## 2018-10-08 ENCOUNTER — Ambulatory Visit: Payer: Medicaid Other | Admitting: Vascular Surgery

## 2018-10-08 ENCOUNTER — Encounter: Payer: Self-pay | Admitting: Vascular Surgery

## 2018-10-08 ENCOUNTER — Other Ambulatory Visit: Payer: Self-pay

## 2018-10-08 VITALS — BP 118/74 | HR 77 | Temp 97.9°F | Resp 16 | Ht 62.0 in | Wt 188.0 lb

## 2018-10-08 DIAGNOSIS — I83811 Varicose veins of right lower extremities with pain: Secondary | ICD-10-CM

## 2018-10-08 HISTORY — PX: ENDOVENOUS ABLATION SAPHENOUS VEIN W/ LASER: SUR449

## 2018-10-08 NOTE — Progress Notes (Signed)
     Laser Ablation Procedure    Date: 10/08/2018   Carol Welch DOB:1975-07-12  Consent signed: Yes    Surgeon:  Dr. Waverly Ferrari  Procedure: Laser Ablation: right Greater Saphenous Vein  BP 118/74 (BP Location: Left Arm, Patient Position: Sitting, Cuff Size: Large)   Pulse 77   Temp 97.9 F (36.6 C) (Oral)   Resp 16   Ht 5\' 2"  (1.575 m)   Wt 188 lb (85.3 kg)   SpO2 98%   BMI 34.39 kg/m   Tumescent Anesthesia: 500 cc 0.9% NaCl with 50 cc Lidocaine HCL 1% and 15 cc 8.4% NaHCO3  Local Anesthesia: 10 cc Lidocaine HCL and NaHCO3 (ratio 2:1)  15 watts continuous mode        Total energy: 2301 Joules   Total time: 2:32   Patient tolerated procedure well  Notes: [name] wore facial mask.  All staff members wore masks and facial shields.   Description of Procedure:  After marking the course of the secondary varicosities, the patient was placed on the operating table in the supine position, and the right leg was prepped and draped in sterile fashion.   Local anesthetic was administered and under ultrasound guidance the saphenous vein was accessed with a micro needle and guide wire; then the mirco puncture sheath was placed.  A guide wire was inserted saphenofemoral junction , followed by a 5 french sheath.  The position of the sheath and then the laser fiber below the junction was confirmed using the ultrasound.  Tumescent anesthesia was administered along the course of the saphenous vein using ultrasound guidance. The patient was placed in Trendelenburg position and protective laser glasses were placed on patient and staff, and the laser was fired at 15 watts continuous mode advancing 1-109mm/second for a total of 2301 joules.       Steri strip was applied to the IV insertion site and ABD pads and thigh high compression stockings were applied.  Ace wrap bandages were applied at the top of the saphenofemoral junction. Blood loss was less than 15 cc.  The patient ambulated out of  the operating room having tolerated the procedure well.

## 2018-10-08 NOTE — Progress Notes (Signed)
Patient name: Carol Welch MRN: [id number] DOB: February 22, 1976 Sex: female  REASON FOR VISIT:   For laser ablation right great saphenous vein  HPI:   Carol Welch is a pleasant 43 y.o. female who I last saw on 07/09/2018 with painful varicose veins in the right lower extremity.  She had CEAP C3 venous disease.  She had significant reflux in her right great saphenous vein extending down to the proximal calf.  I felt that she was a good candidate for laser ablation of the right great saphenous vein as she had failed conservative treatment.  It looks like we could cannulate the vein just above the knee.  Current Outpatient Medications  Medication Sig Dispense Refill  . acetaminophen (TYLENOL) 500 MG tablet Take 1,000 mg by mouth every 6 (six) hours as needed for mild pain, moderate pain, fever or headache.     . clotrimazole-betamethasone (LOTRISONE) cream Apply 1 application topically 2 (two) times daily. 30 g 0  . drospirenone-ethinyl estradiol (YAZ,GIANVI,LORYNA) 3-0.02 MG tablet TK 1 T PO QD UTD    . HYDROcodone-acetaminophen (NORCO/VICODIN) 5-325 MG tablet Take 1-2 tablets by mouth every 6 (six) hours as needed for severe pain. 12 tablet 0  . ibuprofen (ADVIL,MOTRIN) 600 MG tablet Take 1 tablet (600 mg total) by mouth every 6 (six) hours as needed. 30 tablet 0  . IRON PO Take 2 tablets daily by mouth. Over the counter, not sure of dose    . levothyroxine (SYNTHROID, LEVOTHROID) 100 MCG tablet Take 100 mcg by mouth daily before breakfast.     . medroxyPROGESTERone (DEPO-PROVERA) 150 MG/ML injection medroxyprogesterone 150 mg/mL intramuscular suspension  Inject 1 mL every 3 months by intramuscular route.    . medroxyPROGESTERone Acetate 150 MG/ML SUSY medroxyprogesterone 150 mg/mL intramuscular syringe  INJECT 1 ML EVERY 3 MONTHS    . nystatin-triamcinolone (MYCOLOG II) cream nystatin-triamcinolone 100,000 unit/g-0.1 % topical cream  APPLY SMALL AMOUNT TO THE EXTERNAL AFFECTED AREAS BID PRN     . ondansetron (ZOFRAN) 4 MG tablet Take 1 tablet (4 mg total) by mouth every 6 (six) hours. 12 tablet 0  . phentermine (ADIPEX-P) 37.5 MG tablet phentermine 37.5 mg tablet  TK 1 T PO QD    . predniSONE (DELTASONE) 20 MG tablet prednisone 20 mg tablet  TK 3 TS PO ON DAY 1 THEN TK 2 TS ONCE D FOR DAYS 2 THROUGH 5    . Prenatal Vit-Fe Fumarate-FA (PRENATAL MULTIVITAMIN) TABS tablet Take 1 tablet by mouth at bedtime. (Patient not taking: Reported on 07/09/2018) 100 tablet 3  . sertraline (ZOLOFT) 50 MG tablet sertraline 50 mg tablet  TK 1 T PO BID UTD    . terbinafine (LAMISIL) 250 MG tablet Take 1 tablet (250 mg total) by mouth daily. 90 tablet 0   No current facility-administered medications for this visit.     REVIEW OF SYSTEMS:  [X]  denotes positive finding, [ ]  denotes negative finding Vascular    Leg swelling    Cardiac    Chest pain or chest pressure:    Shortness of breath upon exertion:    Short of breath when lying flat:    Irregular heart rhythm:    Constitutional    Fever or chills:     PHYSICAL EXAM:   Vitals:   10/08/18 0833  BP: 118/74  Pulse: 77  Resp: 16  Temp: 97.9 F (36.6 C)  TempSrc: Oral  SpO2: 98%  Weight: 188 lb (85.3 kg)  Height: 5\' 2"  (1.575 m)  GENERAL: The patient is a well-nourished female, in no acute distress. The vital signs are documented above.   DATA:   No new data.  MEDICAL ISSUES:   ENDOVENOUS LASER ABLATION RIGHT GREAT SAPHENOUS VEIN: The patient was taken to the exam room and placed supine.  I examined the right great saphenous vein with the SonoSite.  I elected to cannulate this just above the knee.  The right leg was prepped and draped in usual sterile fashion.  Under ultrasound guidance, after the skin was anesthetized, I cannulated the great saphenous vein just above the knee with a micropuncture needle and a micropuncture sheath was introduced over the wire.  I then advanced the J-wire to the saphenofemoral junction but stayed  out of the femoral vein.  A 35 cm sheath was advanced over the wire and then the dilator was removed.  I then used a wire to measure the length of the laser fiber.  The laser fiber was marked and then positioned just outside the sheath approximately 2-1/2 cm from the saphenofemoral junction.  Next, under ultrasound guidance tumescent anesthesia was administered the entire length of the saphenous vein circumferentially surrounding the vein with tumescent anesthesia up to the saphenofemoral junction.  The patient was then placed in Trendelenburg.  Laser precautions were taken and laser ablation was performed of the right great saphenous vein from approximately 2-1/2 cm distal to the saphenofemoral junction to just above the knee.  A total of 2300 J of energy were used.  Patient tolerated the procedure well.  A pressure dressing was applied.  She will return in 1 week for follow-up duplex.  I have reviewed her postoperative instructions with her.  Waverly Ferrari [facility] [phone]

## 2018-10-15 ENCOUNTER — Ambulatory Visit (HOSPITAL_COMMUNITY)
Admission: RE | Admit: 2018-10-15 | Discharge: 2018-10-15 | Disposition: A | Discharge status: Home / Self Care | Payer: Medicaid Other | Source: Ambulatory Visit | Attending: Vascular Surgery | Admitting: Vascular Surgery

## 2018-10-15 ENCOUNTER — Encounter: Payer: Self-pay | Admitting: Vascular Surgery

## 2018-10-15 ENCOUNTER — Other Ambulatory Visit: Payer: Self-pay

## 2018-10-15 ENCOUNTER — Ambulatory Visit (INDEPENDENT_AMBULATORY_CARE_PROVIDER_SITE_OTHER): Payer: Medicaid Other | Admitting: Vascular Surgery

## 2018-10-15 VITALS — BP 107/69 | HR 65 | Temp 98.0°F | Resp 14 | Ht 62.0 in | Wt 188.0 lb

## 2018-10-15 DIAGNOSIS — Z48812 Encounter for surgical aftercare following surgery on the circulatory system: Secondary | ICD-10-CM

## 2018-10-15 DIAGNOSIS — I83811 Varicose veins of right lower extremities with pain: Secondary | ICD-10-CM

## 2018-10-15 NOTE — Progress Notes (Signed)
Patient name: Carol Welch MRN: [id number] DOB: 1976-04-21 Sex: female  REASON FOR VISIT:   Follow-up after laser ablation of the right great saphenous vein.  HPI:   Carol Welch is a pleasant 42 y.o. female who underwent laser ablation of the right great saphenous vein on 10/08/2018.  A total of 2300 J of energy was used to the level of the knee.  She comes in for a follow-up visit.  She has no specific complaints.  Her pain is been under good control.  She is been elevating her legs.  She will wear her thigh-high stocking for another week.   She denies any chest pain or shortness of breath.  Current Outpatient Medications  Medication Sig Dispense Refill  . acetaminophen (TYLENOL) 500 MG tablet Take 1,000 mg by mouth every 6 (six) hours as needed for mild pain, moderate pain, fever or headache.     . clotrimazole-betamethasone (LOTRISONE) cream Apply 1 application topically 2 (two) times daily. 30 g 0  . drospirenone-ethinyl estradiol (YAZ,GIANVI,LORYNA) 3-0.02 MG tablet TK 1 T PO QD UTD    . HYDROcodone-acetaminophen (NORCO/VICODIN) 5-325 MG tablet Take 1-2 tablets by mouth every 6 (six) hours as needed for severe pain. 12 tablet 0  . ibuprofen (ADVIL,MOTRIN) 600 MG tablet Take 1 tablet (600 mg total) by mouth every 6 (six) hours as needed. 30 tablet 0  . IRON PO Take 2 tablets daily by mouth. Over the counter, not sure of dose    . levothyroxine (SYNTHROID, LEVOTHROID) 100 MCG tablet Take 100 mcg by mouth daily before breakfast.     . medroxyPROGESTERone (DEPO-PROVERA) 150 MG/ML injection medroxyprogesterone 150 mg/mL intramuscular suspension  Inject 1 mL every 3 months by intramuscular route.    . medroxyPROGESTERone Acetate 150 MG/ML SUSY medroxyprogesterone 150 mg/mL intramuscular syringe  INJECT 1 ML EVERY 3 MONTHS    . nystatin-triamcinolone (MYCOLOG II) cream nystatin-triamcinolone 100,000 unit/g-0.1 % topical cream  APPLY SMALL AMOUNT TO THE EXTERNAL AFFECTED AREAS BID PRN    .  ondansetron (ZOFRAN) 4 MG tablet Take 1 tablet (4 mg total) by mouth every 6 (six) hours. 12 tablet 0  . phentermine (ADIPEX-P) 37.5 MG tablet phentermine 37.5 mg tablet  TK 1 T PO QD    . predniSONE (DELTASONE) 20 MG tablet prednisone 20 mg tablet  TK 3 TS PO ON DAY 1 THEN TK 2 TS ONCE D FOR DAYS 2 THROUGH 5    . Prenatal Vit-Fe Fumarate-FA (PRENATAL MULTIVITAMIN) TABS tablet Take 1 tablet by mouth at bedtime. (Patient not taking: Reported on 07/09/2018) 100 tablet 3  . sertraline (ZOLOFT) 50 MG tablet sertraline 50 mg tablet  TK 1 T PO BID UTD    . terbinafine (LAMISIL) 250 MG tablet Take 1 tablet (250 mg total) by mouth daily. 90 tablet 0   No current facility-administered medications for this visit.     REVIEW OF SYSTEMS:  [X]  denotes positive finding, [ ]  denotes negative finding Vascular    Leg swelling    Cardiac    Chest pain or chest pressure:    Shortness of breath upon exertion:    Short of breath when lying flat:    Irregular heart rhythm:    Constitutional    Fever or chills:     PHYSICAL EXAM:   Vitals:   10/15/18 1044  BP: 107/69  Pulse: 65  Resp: 14  Temp: 98 F (36.7 C)  TempSrc: Temporal  SpO2: 98%  Weight: 188 lb (85.3 kg)  Height:  5\' 2"  (1.575 m)    GENERAL: The patient is a well-nourished female, in no acute distress. The vital signs are documented above. CARDIOVASCULAR: There is a regular rate and rhythm. PULMONARY: There is good air exchange bilaterally without wheezing or rales. VASCULAR: She has some moderate bruising along the medial right thigh.  She has no significant leg swelling.  DATA:   VENOUS DUPLEX: I have independently interpreted her venous duplex scan.  There is no evidence of DVT in the right lower extremity.  The saphenous vein is closed up to the saphenofemoral junction (EHIT-1).  MEDICAL ISSUES:   STATUS POST LASER ABLATION RIGHT GREAT SAPHENOUS VEIN: The patient is doing well status post laser ablation of the right great  saphenous vein.  She will wear her stocking for another week.  But encouraged her to continue to elevate her legs.  We have again discussed conservative measures for preventing progression of venous disease.  She does have some interest in sclerotherapy for some spider veins and reticular veins in her right leg.  I have put her name on the list once we have this program back up and running.  I explained that things have certainly been delayed because of the coronavirus situation.   Waverly Ferrarihristopher  [facility] [phone]

## 2019-01-19 ENCOUNTER — Ambulatory Visit: Payer: Medicaid Other

## 2019-02-16 ENCOUNTER — Other Ambulatory Visit: Payer: Self-pay

## 2019-02-16 ENCOUNTER — Ambulatory Visit: Payer: Medicaid Other

## 2019-02-16 DIAGNOSIS — I8393 Asymptomatic varicose veins of bilateral lower extremities: Secondary | ICD-10-CM

## 2019-02-16 NOTE — Progress Notes (Signed)
Treated patient's bilateral spider and reticular veins with 1.5 mL of 1% Asclera  administered with a 27g butterfly. Pt tolerated relatively well. Dr. Donnetta Hutching came in prior to treatment to assess pt, as she has c/o periodic swelling in left leg and "burning" x4 weeks. Dr. Donnetta Hutching approved her to proceed with sclerotherapy today. Pt was given both written and verbal post procedure care instructions. Will follow PRN.  Photos: Yes.    Compression stockings applied: Yes.

## 2019-02-25 ENCOUNTER — Encounter: Payer: Self-pay | Admitting: Vascular Surgery

## 2019-07-19 ENCOUNTER — Other Ambulatory Visit: Payer: Self-pay | Admitting: Obstetrics and Gynecology

## 2019-07-19 DIAGNOSIS — Z1231 Encounter for screening mammogram for malignant neoplasm of breast: Secondary | ICD-10-CM

## 2019-07-21 ENCOUNTER — Ambulatory Visit
Admission: RE | Admit: 2019-07-21 | Discharge: 2019-07-21 | Disposition: A | Discharge status: Home / Self Care | Payer: Medicaid Other | Source: Ambulatory Visit | Attending: Obstetrics and Gynecology | Admitting: Obstetrics and Gynecology

## 2019-07-21 ENCOUNTER — Other Ambulatory Visit: Payer: Self-pay

## 2019-07-21 DIAGNOSIS — Z1231 Encounter for screening mammogram for malignant neoplasm of breast: Secondary | ICD-10-CM

## 2019-08-13 ENCOUNTER — Encounter: Payer: Self-pay | Admitting: Family Medicine

## 2019-08-13 ENCOUNTER — Other Ambulatory Visit: Payer: Self-pay

## 2019-08-13 ENCOUNTER — Ambulatory Visit: Payer: Medicaid Other | Admitting: Family Medicine

## 2019-08-13 VITALS — BP 104/60 | HR 74 | Wt 160.6 lb

## 2019-08-13 DIAGNOSIS — R109 Unspecified abdominal pain: Secondary | ICD-10-CM

## 2019-08-13 DIAGNOSIS — Z130 Encounter for screening for diseases of the blood and blood-forming organs and certain disorders involving the immune mechanism: Secondary | ICD-10-CM

## 2019-08-13 DIAGNOSIS — R5383 Other fatigue: Secondary | ICD-10-CM

## 2019-08-13 DIAGNOSIS — E039 Hypothyroidism, unspecified: Secondary | ICD-10-CM

## 2019-08-13 DIAGNOSIS — R140 Abdominal distension (gaseous): Secondary | ICD-10-CM | POA: Diagnosis not present

## 2019-08-13 DIAGNOSIS — R87612 Low grade squamous intraepithelial lesion on cytologic smear of cervix (LGSIL): Secondary | ICD-10-CM

## 2019-08-13 NOTE — Patient Instructions (Addendum)
It was a pleasure to meet you!   1. We did some basic blood work today, we can discuss results in 1 week at your next appointment.  2. We plan to do a pap smear at your next appointment.  3. Keep a food diary of what you eat and your symptoms.  Be Well!  Dr. Leary Roca

## 2019-08-14 LAB — CBC WITH DIFFERENTIAL/PLATELET
Basophils Absolute: 0.1 10*3/uL (ref 0.0–0.2)
Basos: 1 %
EOS (ABSOLUTE): 0.1 10*3/uL (ref 0.0–0.4)
Eos: 2 %
Hematocrit: 40 % (ref 34.0–46.6)
Hemoglobin: 13.1 g/dL (ref 11.1–15.9)
Immature Grans (Abs): 0 10*3/uL (ref 0.0–0.1)
Immature Granulocytes: 0 %
Lymphocytes Absolute: 2.5 10*3/uL (ref 0.7–3.1)
Lymphs: 37 %
MCH: 30 pg (ref 26.6–33.0)
MCHC: 32.8 g/dL (ref 31.5–35.7)
MCV: 92 fL (ref 79–97)
Monocytes Absolute: 0.4 10*3/uL (ref 0.1–0.9)
Monocytes: 6 %
Neutrophils Absolute: 3.7 10*3/uL (ref 1.4–7.0)
Neutrophils: 54 %
Platelets: 276 10*3/uL (ref 150–450)
RBC: 4.37 x10E6/uL (ref 3.77–5.28)
RDW: 13.7 % (ref 11.7–15.4)
WBC: 6.7 10*3/uL (ref 3.4–10.8)

## 2019-08-14 LAB — BASIC METABOLIC PANEL
BUN/Creatinine Ratio: 35 — ABNORMAL HIGH (ref 9–23)
BUN: 21 mg/dL (ref 6–24)
CO2: 21 mmol/L (ref 20–29)
Calcium: 8.6 mg/dL — ABNORMAL LOW (ref 8.7–10.2)
Chloride: 107 mmol/L — ABNORMAL HIGH (ref 96–106)
Creatinine, Ser: 0.6 mg/dL (ref 0.57–1.00)
GFR calc Af Amer: 130 mL/min/{1.73_m2} (ref >59)
GFR calc non Af Amer: 113 mL/min/{1.73_m2} (ref >59)
Glucose: 91 mg/dL (ref 65–99)
Potassium: 4.9 mmol/L (ref 3.5–5.2)
Sodium: 141 mmol/L (ref 134–144)

## 2019-08-14 LAB — TSH: TSH: 0.279 u[IU]/mL — ABNORMAL LOW (ref 0.450–4.500)

## 2019-08-16 ENCOUNTER — Encounter: Payer: Self-pay | Admitting: Family Medicine

## 2019-08-16 DIAGNOSIS — R109 Unspecified abdominal pain: Secondary | ICD-10-CM | POA: Insufficient documentation

## 2019-08-16 NOTE — Assessment & Plan Note (Signed)
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 Morton Peters H&P from 2018.  - Follow up in 1 week on 4/16 for Pap smear and pelvic exam

## 2019-08-16 NOTE — Progress Notes (Signed)
SUBJECTIVE:   CHIEF COMPLAINT / HPI: new patient establishing care  HPI: Patient presenting to establish care. She has h/o hypothyroidism and takes levothyroxine, diagnosed in 2001, not recently checked. She uses Yaz for birth control. She had an ovarian cyst in 2015 with suspicion for PID, a benign breast cyst in 2019 and infectious colitis in 2013. 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 No repeat in Epic and patient does not remember a repeat since that time.    She has a constellation of symptoms that she finds very bothersome which include intermittent fatigue, abdominal bloating and cramping. She thinks that it is worse with eating certain foods such as red meat and flour.   She also has vaginal bleeding after intercourse, with some pain and discomfort.  Social hx: patient is married and has 5 children, 2 are in their 38s. Her youngest son who is 13 yo was born with imperforate anus. She exercises by running 3x per week, does not smoke cigarettes, drinks alcohol rarely (1-2 glasses per year), and does not use recreational drugs.   PERTINENT  PMH / PSH: Hypothyroidism, LGSIL and + HR HPV, ovarian cyst, benign breast cyst  OBJECTIVE:   BP 104/60   Pulse 74   Wt 160 lb 9.6 oz (72.8 kg)   LMP 08/13/2019 (Approximate)   SpO2 98%   BMI 29.37 kg/m   Physical Exam Vitals and nursing note reviewed.  Constitutional:      General: She is not in acute distress.    Appearance: Normal appearance. She is normal weight. She is not ill-appearing, toxic-appearing or diaphoretic.  HENT:     Head: Normocephalic and atraumatic.     Mouth/Throat:     Mouth: Mucous membranes are moist.  Eyes:     Conjunctiva/sclera: Conjunctivae normal.     Pupils: Pupils are equal, round, and reactive to light.  Cardiovascular:     Rate and Rhythm: Normal rate and regular rhythm.     Pulses: Normal pulses.     Heart sounds: Normal heart sounds. No murmur. No  friction rub. No gallop.   Pulmonary:     Effort: Pulmonary effort is normal.     Breath sounds: Normal breath sounds. No wheezing, rhonchi or rales.  Abdominal:     General: Abdomen is flat. Bowel sounds are normal. There is no distension.     Palpations: Abdomen is soft.     Tenderness: There is no abdominal tenderness.  Musculoskeletal:     Right lower leg: No edema.     Left lower leg: No edema.  Skin:    General: Skin is warm and dry.     Capillary Refill: Capillary refill takes less than 2 seconds.  Neurological:     General: No focal deficit present.     Mental Status: She is alert and oriented to person, place, and time. Mental status is at baseline.  Psychiatric:        Mood and Affect: Mood normal.        Behavior: Behavior normal.    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 Morton Peters H&P from 2018.  - Follow up in 1 week on 4/16 for Pap smear and pelvic exam  Hypothyroidism Obtain TSH today to assess hypothyroidism.   Abdominal bloating with cramps Benign exam with intermittent symptoms that have occurred on and off over several years. In April 2020 patient had an abdominal CT and pelvic ultrasound that were both WNL. Recommend patient keep a food and symptom diary. - BMP, CBC obtained today - Follow up 1 week   Gladys Damme, MD [place]

## 2019-08-16 NOTE — Assessment & Plan Note (Signed)
Obtain TSH today to assess hypothyroidism.

## 2019-08-16 NOTE — Assessment & Plan Note (Signed)
Benign exam with intermittent symptoms that have occurred on and off over several years. In April 2020 patient had an abdominal CT and pelvic ultrasound that were both WNL. Recommend patient keep a food and symptom diary. - BMP, CBC obtained today - Follow up 1 week

## 2019-08-17 ENCOUNTER — Encounter: Payer: Self-pay | Admitting: Family Medicine

## 2019-08-20 ENCOUNTER — Other Ambulatory Visit (HOSPITAL_COMMUNITY)
Admission: RE | Admit: 2019-08-20 | Discharge: 2019-08-20 | Disposition: A | Discharge status: Home / Self Care | Payer: Medicaid Other | Source: Ambulatory Visit | Attending: Family Medicine | Admitting: Family Medicine

## 2019-08-20 ENCOUNTER — Ambulatory Visit (INDEPENDENT_AMBULATORY_CARE_PROVIDER_SITE_OTHER): Payer: Medicaid Other | Admitting: Family Medicine

## 2019-08-20 ENCOUNTER — Encounter: Payer: Self-pay | Admitting: Family Medicine

## 2019-08-20 ENCOUNTER — Other Ambulatory Visit: Payer: Self-pay

## 2019-08-20 VITALS — BP 96/58 | HR 74 | Ht 62.0 in | Wt 158.8 lb

## 2019-08-20 DIAGNOSIS — Z124 Encounter for screening for malignant neoplasm of cervix: Secondary | ICD-10-CM | POA: Diagnosis not present

## 2019-08-20 DIAGNOSIS — R109 Unspecified abdominal pain: Secondary | ICD-10-CM | POA: Diagnosis not present

## 2019-08-20 DIAGNOSIS — R87612 Low grade squamous intraepithelial lesion on cytologic smear of cervix (LGSIL): Secondary | ICD-10-CM | POA: Diagnosis not present

## 2019-08-20 DIAGNOSIS — I951 Orthostatic hypotension: Secondary | ICD-10-CM | POA: Diagnosis not present

## 2019-08-20 DIAGNOSIS — R140 Abdominal distension (gaseous): Secondary | ICD-10-CM | POA: Diagnosis not present

## 2019-08-20 DIAGNOSIS — E039 Hypothyroidism, unspecified: Secondary | ICD-10-CM | POA: Diagnosis not present

## 2019-08-20 DIAGNOSIS — Z730 Burn-out: Secondary | ICD-10-CM

## 2019-08-20 DIAGNOSIS — R1030 Lower abdominal pain, unspecified: Secondary | ICD-10-CM | POA: Diagnosis not present

## 2019-08-20 NOTE — Patient Instructions (Signed)
It was lovely to see you again today.  We are working on several things:  1. Start cutting your levothyroxine in half and only take half a pill per day. When you get low on pills, call the office at [phone] and I can refill them for you  2. Keep writing down your symptoms and what you eat on a daily basis and we will go over the results at our next visit.  3. I did a pap smear today, and will let you know of any abnormal results.  4. I will reach out to our social worker and see if there is anything we can do to help you with care for your son. Expect Carol Welch to reach out to you.  5. 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 They can get you in relatively quickly and can help with transportation, etc.   6. Follow up with me in 3 weeks.  Be Well!

## 2019-08-22 LAB — TISSUE TRANSGLUTAMINASE, IGA: Transglutaminase IgA: <2 U/mL (ref 0–3)

## 2019-08-23 LAB — CYTOLOGY - PAP
Chlamydia: NEGATIVE
Comment: NEGATIVE
Comment: NEGATIVE
Comment: NEGATIVE
Comment: NORMAL
Diagnosis: NEGATIVE
High risk HPV: NEGATIVE
Neisseria Gonorrhea: NEGATIVE
Trichomonas: NEGATIVE

## 2019-08-24 DIAGNOSIS — I951 Orthostatic hypotension: Secondary | ICD-10-CM | POA: Insufficient documentation

## 2019-08-24 DIAGNOSIS — F329 Major depressive disorder, single episode, unspecified: Secondary | ICD-10-CM | POA: Insufficient documentation

## 2019-08-24 DIAGNOSIS — Z730 Burn-out: Secondary | ICD-10-CM | POA: Insufficient documentation

## 2019-08-24 NOTE — Assessment & Plan Note (Addendum)
Patient reported dizziness and had hypotensive BP of 96/58. Orthostatics were negative for BP, but HR changed by 36 to maintain BP between lying and sitting, thus positive. Likely patient has POTS, which may also explain abdominal discomfort.  - Recommend eating increased salt, drink 2L H2O per day - Will follow up on symptom diary - Will consider additionaly BP support if conservative measures do not work

## 2019-08-24 NOTE — Assessment & Plan Note (Signed)
Pap smear performed today w/ HPV co-testing, GC/Ch.

## 2019-08-24 NOTE — Assessment & Plan Note (Signed)
TSH very low at 0.279. Patient has been using 100 mcg levothyroxine for many years. Recommend taking 1/2 pill (50 mcg) daily and will recheck in ~6 weeks.

## 2019-08-24 NOTE — Assessment & Plan Note (Signed)
Patient to continue keeping food/sx diary, we will go over it at next visit. BMP and CBC both reassuringly normal. Although no report of diarrhea, constipation, and no activities to increase risk of infection, this makes ddx like giardia, parasites, IBS less likely. Will test for celiac disease today, will consider stool O&P at next visit if symptoms persist.  - Obtain TTG IgG - Follow up 3 weeks

## 2019-08-24 NOTE — Progress Notes (Signed)
SUBJECTIVE:   CHIEF COMPLAINT / HPI: follow up pap smear, abdominal pain  Pap smear: patient has not had a repeat since 2017, which was reportedly LGSIL and + HR HPV. She reports no vaginal discharge, but occasionally has discomfort and small amount of bleeding after intercourse. Previous h/o PID.  Abdominal discomfort: patient continues to intermittently have lower abdominal bloating and discomfort, she is keeping a food diary. She does not have diarrhea, regularly has soft BMs qd. Has not gone camping, or eaten uncooked meat, drank water not from tap/bottle. Previously had an abdominal CT with no abnormalities to account for symptoms in April 2020. She has had 2 TVUS with promininent ovarian follicles.  Dizziness: patient reports some dizziness today, BP hypotensive at 96/58. Orthostatics obtained.  PHQ-9: patient has elevated score, she believes it is from caring for her 61 yo son who has imperforate anus. She has to give him an enema every day and now that he is 4, she has to fight with him to perform it, which is difficult and emotionally draining. Sometimes her husband can help, but he works long hours and she is the primary care giver. Her son sees a Psychologist, sport and exercise at [employer], and she would like to see if she can get a home health aide or RN to help a few days a week with enemas.  PERTINENT  PMH / PSH: hypothyroidism, LGSIL +HR HPV, h/o PID  OBJECTIVE:   BP (!) 96/58   Pulse 74   Ht 5\' 2"  (1.575 m)   Wt 158 lb 12.8 oz (72 kg)   LMP 08/13/2019 (Approximate)   SpO2 99%   BMI 29.04 kg/m   Orthostatic VS for the past 24 hrs:  BP- Lying Pulse- Lying BP- Sitting Pulse- Sitting BP- Standing at 0 minutes Pulse- Standing at 0 minutes  08/24/19 1118 106/68 75 100/62 111 102/70 93   Physical Exam Vitals and nursing note reviewed. Exam conducted with a chaperone present.  Constitutional:      General: She is not in acute distress.    Appearance: Normal appearance. She is normal weight. She is  not ill-appearing, toxic-appearing or diaphoretic.  HENT:     Head: Normocephalic and atraumatic.  Cardiovascular:     Rate and Rhythm: Normal rate and regular rhythm.     Pulses: Normal pulses.     Heart sounds: Normal heart sounds. No murmur. No friction rub. No gallop.   Pulmonary:     Effort: Pulmonary effort is normal.     Breath sounds: Normal breath sounds. No wheezing, rhonchi or rales.  Abdominal:     General: Abdomen is flat. Bowel sounds are normal. There is no distension.     Palpations: Abdomen is soft.     Tenderness: There is abdominal tenderness.     Comments: Mild tenderness in bilateral lower quadrants  Genitourinary:    Comments: PELVIC:  Normal appearing external female genitalia, normal vaginal epithelium, no abnormal discharge. Cervix appears normal, large transition zone, no obvious abnormal growths concerning for cervical ca. Musculoskeletal:     Right lower leg: No edema.     Left lower leg: No edema.  Skin:    General: Skin is warm and dry.  Neurological:     General: No focal deficit present.     Mental Status: She is alert and oriented to person, place, and time. Mental status is at baseline.  Psychiatric:        Behavior: Behavior normal.     Comments:  Sad mood, full affect    Depression screen [facility] 2/9 08/20/2019 08/20/2019 08/13/2019 08/13/2019  Decreased Interest 1 1 0 1  Down, Depressed, Hopeless 2 2 1 1   PHQ - 2 Score 3 3 1 2   Altered sleeping 2 - 1 -  Tired, decreased energy 3 - 2 -  Change in appetite 0 - 0 -  Feeling bad or failure about yourself  2 - 0 -  Trouble concentrating 1 - 1 -  Moving slowly or fidgety/restless 1 - 0 -  Suicidal thoughts 0 - 0 -  PHQ-9 Score 12 - 5 -   ASSESSMENT/PLAN:  Ms Wedin is a 44 yo woman presenting for a follow up and pap smear today.  Abdominal bloating with cramps Patient to continue keeping food/sx diary, we will go over it at next visit. BMP and CBC both reassuringly normal. Although no report of  diarrhea, constipation, and no activities to increase risk of infection, this makes ddx like giardia, parasites, IBS less likely. Will test for celiac disease today, will consider stool O&P at next visit if symptoms persist.  - Obtain TTG IgG - Follow up 3 weeks  Abnormal cervical Papanicolaou smear Pap smear performed today w/ HPV co-testing, GC/Ch.  Hypothyroidism TSH very low at 0.279. Patient has been using 100 mcg levothyroxine for many years. Recommend taking 1/2 pill (50 mcg) daily and will recheck in ~6 weeks.  Burnout of caregiver Patient has a 41 yo son w/ imperforate anus and is the primary caregiver. PHQ-9 with elevated result of 12, answer to question 9 is zero. 1 week ago, PHQ-9 was 5. Patient reports this is due to her being the primary care giver and having more and more difficulty with being able to give him enemas at home. She would like to be able to get help with this, either at his surgeon's office or with in home help via [facility] aide or RN. I plan to ask our SW if this is possible for a pediatric patient, and referred patient to also ask her son's surgeon, Dr. 10. Also suggested that therapy may be beneficial for patient to process feelings around providing care to a medically complex child, will give resources and refer.  Orthostatic hypotension Patient reported dizziness and had hypotensive BP of 96/58. Orthostatics were negative for BP, but HR changed by 36 to maintain BP between lying and sitting, thus positive. Likely patient has POTS, which may also explain abdominal discomfort.  - Recommend eating increased salt, drink 2L H2O per day - Will follow up on symptom diary - Will consider additionaly BP support if conservative measures do not work   [facility], MD [facility]

## 2019-08-24 NOTE — Assessment & Plan Note (Addendum)
Patient has a 43 yo son w/ imperforate anus and is the primary caregiver. PHQ-9 with elevated result of 12, answer to question 9 is zero. 1 week ago, PHQ-9 was 5. Patient reports this is due to her being the primary care giver and having more and more difficulty with being able to give him enemas at home. She would like to be able to get help with this, either at his surgeon's office or with in home help via [facility] aide or RN. I plan to ask our SW if this is possible for a pediatric patient, and referred patient to also ask her son's surgeon, Dr. Hollie Beach. Also suggested that therapy may be beneficial for patient to process feelings around providing care to a medically complex child, will give resources and refer.

## 2019-08-25 ENCOUNTER — Ambulatory Visit: Payer: Self-pay | Admitting: Licensed Clinical Social Worker

## 2019-08-25 NOTE — Chronic Care Management (AMB) (Signed)
   Social Work  Care Management Consultation  08/25/2019 Name: Carol Welch MRN: [id number] DOB: 10/19/1975  Carol Welch is a 44 y.o. year old female who sees Shirlean Mylar, MD for primary care. LCSW was consulted by PCP via in-basket message for information /resources to assistance patient with Caregiver Stress. Patient was not interviewed or contacted during this encounter.     Recommendation: After consultation with provider it is determined that patient may benefit from ongoing counseling. Per PCP information has been provided to patient. Based on patient's stressors she may benefit from follow up from LCSW to assisting with connecting her to therapy services if she is having difficulty with follow through.  OBJECTIVE:  PHQ-9 score of 12 is an indication of moderate depression. Depression screen [facility] 2/9 08/20/2019 08/20/2019 08/13/2019  Decreased Interest 1 1 0  Down, Depressed, Hopeless 2 2 1   PHQ - 2 Score 3 3 1   Altered sleeping 2 - 1  Tired, decreased energy 3 - 2  Change in appetite 0 - 0  Feeling bad or failure about yourself  2 - 0  Trouble concentrating 1 - 1  Moving slowly or fidgety/restless 1 - 0  Suicidal thoughts 0 - 0  PHQ-9 Score 12 - 5    Intervention: Relevant information and resources discussed with provider via in-basket message. Provider has given information to patient.   Review of patient status, including review of consultants reports, relevant laboratory and other test results, and collaboration with appropriate care team members and the patient's provider was performed as part of comprehensive patient evaluation and provision of chronic care management services.    Plan:  1.The care management team is available to follow up with the patient if further intervention is needed.  2. Please place formal CCM referral and specify what is needed from LCSW 3. No further follow up required by LCSW at this time until formal referral placed.  , LCSW Chronic  Care Coordination  [facility] / [facility]   [phone] 9:27 AM

## 2019-08-26 ENCOUNTER — Other Ambulatory Visit: Payer: Self-pay | Admitting: Family Medicine

## 2019-08-26 DIAGNOSIS — Z730 Burn-out: Secondary | ICD-10-CM

## 2019-08-27 ENCOUNTER — Ambulatory Visit: Payer: Medicaid Other | Admitting: Family Medicine

## 2019-08-27 ENCOUNTER — Encounter: Payer: Self-pay | Admitting: Family Medicine

## 2019-09-02 ENCOUNTER — Ambulatory Visit: Payer: Self-pay | Admitting: Licensed Clinical Social Worker

## 2019-09-02 NOTE — Chronic Care Management (AMB) (Signed)
  Social Work Care Management  Unsuccessful Phone Outreach   09/02/2019 Name: [name] MRN: [id number] DOB: 10-Oct-1975  Reason for referral : Care Coordination (caregiver stress)   [name] is a 44 y.o. year old female who sees Shirlean Mylar, MD for primary care.  LCSW received referral to assist patient with managing caregiver stress .  LCSW called patient to assess needs and barriers reference the above referral. Telephone outreach was unsuccessful. Unable to leave HIPPA compliant phone message as voice mail was full.  Plan:  If no return call is received. LCSW will call again in 3 to 5 days.  Sammuel Hines, LCSW Chronic Care Coordination  [facility] / [facility]   [phone] 2:05 PM

## 2019-09-08 ENCOUNTER — Telehealth: Payer: Medicaid Other

## 2019-09-08 ENCOUNTER — Ambulatory Visit: Payer: Self-pay | Admitting: Licensed Clinical Social Worker

## 2019-09-08 ENCOUNTER — Other Ambulatory Visit: Payer: Self-pay

## 2019-09-08 NOTE — Chronic Care Management (AMB) (Signed)
  Social Work Care Management  Unsuccessful Phone Outreach   09/08/2019 Name: Shandrell Boda MRN: [id number] DOB: 1976/02/02 Reason for referral : Care Coordination (caregiver stress)   Janira Mandell is a 44 y.o. year old female who sees Shirlean Mylar, MD for primary care.    LCSW called patient to assess needs and barriers reference the above referral. Telephone outreach was unsuccessful.  A HIPPA compliant phone message was left for the patient providing contact information and requesting a return call.  Plan:  If no return call is received. LCSW will call again in 2 to 5 days.  Sammuel Hines, LCSW Chronic Care Coordination  [facility] / [facility]   [phone] 12:26 PM

## 2019-09-09 ENCOUNTER — Ambulatory Visit (INDEPENDENT_AMBULATORY_CARE_PROVIDER_SITE_OTHER): Payer: Medicaid Other | Admitting: Family Medicine

## 2019-09-09 ENCOUNTER — Encounter: Payer: Self-pay | Admitting: Family Medicine

## 2019-09-09 ENCOUNTER — Ambulatory Visit: Payer: Medicaid Other | Admitting: Licensed Clinical Social Worker

## 2019-09-09 ENCOUNTER — Other Ambulatory Visit: Payer: Self-pay

## 2019-09-09 VITALS — BP 102/72 | HR 72 | Ht 62.0 in | Wt 163.2 lb

## 2019-09-09 DIAGNOSIS — K640 First degree hemorrhoids: Secondary | ICD-10-CM | POA: Diagnosis not present

## 2019-09-09 DIAGNOSIS — I951 Orthostatic hypotension: Secondary | ICD-10-CM

## 2019-09-09 DIAGNOSIS — R109 Unspecified abdominal pain: Secondary | ICD-10-CM | POA: Diagnosis not present

## 2019-09-09 DIAGNOSIS — Z730 Burn-out: Secondary | ICD-10-CM

## 2019-09-09 DIAGNOSIS — R140 Abdominal distension (gaseous): Secondary | ICD-10-CM | POA: Diagnosis not present

## 2019-09-09 DIAGNOSIS — E039 Hypothyroidism, unspecified: Secondary | ICD-10-CM | POA: Diagnosis not present

## 2019-09-09 DIAGNOSIS — R87612 Low grade squamous intraepithelial lesion on cytologic smear of cervix (LGSIL): Secondary | ICD-10-CM

## 2019-09-09 NOTE — Patient Instructions (Signed)
It was a pleasure to see you!  For your low blood pressure and fatigue: increase salt and hydrate with water. You can use an app to track this, I also recommend keeping a diary. I also encourage you to connect with our social worker to help with resources for therapy. In 2 weeks, come back for a lab visit to check your thyroid. You can follow up with me whenever you prefer.  Be Well!

## 2019-09-10 ENCOUNTER — Telehealth: Payer: Medicaid Other

## 2019-09-10 NOTE — Patient Instructions (Signed)
Licensed Clinical Social Worker Visit Information Carol Welch  it was nice speaking with you. Please call me directly if you have questions [phone] Goals we discussed today: resources to manage stress Goals Addressed            This Visit's Progress   . Reduce caregiver stress       CARE PLAN ENTRY (see longitudinal plan of care for additional care plan information)  Current Barriers:  . Patient with caregiver stress needs community resources,  . Acknowledges deficits and needs support, education and care coordination in order to meet this unmet need  Clinical Goal(s)  . Over the next 45 days patient will be able to reduce stress as demonstrated by more self-care and community support Interventions provided by LCSW:  . Assessed patient's care coordination needs, current community support, how managing and other options.  . Provided patient with information about [facility] and discussed various counseling options for her . Advised patient to review [facility] brochure and we would discuss further next week . Collaborated with appropriate clinical care team members regarding patient needs . Emotional support Patient Self Care Activities & Deficits:  . Patient is unable to independently navigate community resource options without care coordination support  . Acknowledges deficits and is motivated to resolve concerns  . Patient is able to review resources as discussed today she will also contact her son's social worker to determine what agency she is with Initial goal documentation     Materials provided: Yes: brochure ( [facility]) Carol Welch received Care Management services today:  1. Care Management services include personalized support from designated clinical staff supervised by her physician, including individualized plan of care and coordination with other care providers 2. 24/7 contact [phone] for assistance for urgent and routine care needs. 3. Care Management are voluntary services and  be declined at any time by calling the office.  Patient verbally agreed to assistance and services provided by embedded care coordination/care management team today.   Patient verbalizes understanding of instructions provided today.  Follow up plan:  SW will follow up with patient by phone over the next week  Soundra Pilon, LCSW

## 2019-09-10 NOTE — Chronic Care Management (AMB) (Signed)
Care Management   Clinical Social Work initial Note  09/10/2019 Name: Carol Welch MRN: [id number] DOB: [birth year] The Care Management team was consulted by PCP to assist the patient with managing Caregiver Stress.   Carol Welch is a 44 y.o. year old female who sees Gladys Damme, MD for primary care. LCSW met with Carol Welch today during office with with PCP to introduce self, assess needs and barriers to care.   Assessment: Patient is pleasant and engaged in conversation.  Experiencing stress which seems to be exacerbated by trying to meet the needs of her family and manage chronic medical need of her 25 year old son.      Review of patient status, including review of consultants reports, relevant laboratory and other test results, and collaboration with appropriate care team members and the patient's provider was performed as part of comprehensive patient evaluation and provision of chronic care management services.    SDOH (Social Determinants of Health) assessments performed: Yes:  SDOH Interventions     Most Recent Value  SDOH Interventions  SDOH Interventions for the Following Domains  Stress  Stress Interventions  Provide Counseling, Other (Comment)      Goals Addressed            This Visit's Progress   . Reduce caregiver stress       CARE PLAN ENTRY (see longitudinal plan of care for additional care plan information)  Current Barriers:  . Patient with caregiver stress needs community resources,  . Acknowledges deficits and needs support, education and care coordination in order to meet this unmet need  Clinical Goal(s)  . Over the next 45 days patient will be able to reduce stress as demonstrated by more self-care and community support Interventions provided by LCSW:  . Assessed patient's care coordination needs, current community support, how managing and other options.  . Provided patient with information about [place] and discussed various counseling options for  her . Advised patient to review [place] brochure and we would discuss further next week . Collaborated with appropriate clinical care team members regarding patient needs . Emotional support Patient Self Care Activities & Deficits:  . Patient is unable to independently navigate community resource options without care coordination support  . Acknowledges deficits and is motivated to resolve concerns  . Patient is able to review resources as discussed today she will also contact her son's social worker to determine what agency she is with Initial goal documentation      Outpatient Encounter Medications as of 09/09/2019  Medication Sig  . acetaminophen (TYLENOL) 500 MG tablet Take 1,000 mg by mouth every 6 (six) hours as needed for mild pain, moderate pain, fever or headache.   . drospirenone-ethinyl estradiol (YAZ,GIANVI,LORYNA) 3-0.02 MG tablet TK 1 T PO QD UTD  . ibuprofen (ADVIL,MOTRIN) 600 MG tablet Take 1 tablet (600 mg total) by mouth every 6 (six) hours as needed.  Marland Kitchen levothyroxine (SYNTHROID, LEVOTHROID) 100 MCG tablet Take 100 mcg by mouth daily before breakfast.    No facility-administered encounter medications on file as of 09/09/2019.       Information about Care Management services was shared with Ms.  Goedde today including:  1. Care Management services include personalized support from designated clinical staff supervised by her physician, including individualized plan of care and coordination with other care providers 2. Remind patient of 24/7 contact phone numbers to provider's office for assistance with urgent and routine care needs. 3. Care Management services are voluntary and patient may  stop at any time .  Patient agreed to services provided today and verbal consent obtained.   Follow Up Plan:  F/U Phone call scheduled 09/15/19  Carol Welch, [place] / [place]   [phone] 11:07 AM

## 2019-09-11 ENCOUNTER — Encounter: Payer: Self-pay | Admitting: Family Medicine

## 2019-09-11 DIAGNOSIS — K645 Perianal venous thrombosis: Secondary | ICD-10-CM | POA: Insufficient documentation

## 2019-09-11 DIAGNOSIS — K649 Unspecified hemorrhoids: Secondary | ICD-10-CM | POA: Insufficient documentation

## 2019-09-11 NOTE — Progress Notes (Signed)
SUBJECTIVE:   CHIEF COMPLAINT / HPI: fatigue, dizziness, abdominal discomfort  Fatigue/Dizziness: patient continues to have these symptoms intermittently. CBC, BMP WNL at last visit. TSH low, likely due to taking too much levothyroxine, see below. Orthostatic vitals taken at last visit are consistent with POTS as patient had an increase in heart rate of 36 bpm between lying and sitting (75 to 111) to maintain BP. Discussed the likely multifactorial nature of POTS and her symptoms, as she is also experiencing significant stress and caregiver burnout (see below). Patient opted to try some conservative options to support blood pressure: goal to drink 2L of water per day, using more salt in food, keeping a symptom diary, and serial exercise.   Abdominal discomfort: patient recently saw her OB-GYN and had a pelvic ultrasound. Cannot see results, but reported in [place] is the diagnosis of adenomyosis, which could be contributing to patient's abdominal discomfort. She does not have diarrhea or constipation, soft bowel movements. However she intermittently has a deep ache in her lower abdomen and discomfort during intercourse. Today, this is not present. TTg negative. Patient will follow up next week with OB/GYN to discuss treatment options for adenomyosis. Patient has been keeping a symptom diary and has noticed that she feels better if she doesn't eat red meat or flour. Requested patient bring journal at next visit.  Pain with bowel movements: Patient notes that she has 1 BM each day that is soft, but she has some pain with it, no bleeding. She is not sure if she has hemorrhoids, but states that she feels a bump on her rectum.  Hypothyroidism: Patient had mildly low TSH at 0.276 at last visit, indicating that she was taking too much levothyroxine. Decreased levohtyroxine by 1/2 (now down to 50 mcg). Will check TSH in 2 weeks. Patient wants to see an endocrinologist for this problem, discussed with  her that she may not need to depending on TSH in 2 weeks. She is willing to wait for result and then revisit the issue.  Caregiver burnout: Patient recently hired a friend who is a Marine scientist to help give her son an enema 2x per week to reduce the burden on herself. She is looking forward to this and feeling more hopeful. GAD-7 with result of 5, indicating mild anxiety, likely due to circumstances. PHQ-9 improved from 12 to 6. Previously referred patient to discuss options for therapy with Casimer Lanius, LCSW, but they had not managed to make contact over the phone. Since Ms. Laurance Flatten is in the clinic today, they will connect to make an appointment to discuss options.  PERTINENT  PMH / PSH: hypothryroidism, adenomyosis, caregiver burnout   OBJECTIVE:   BP 102/72   Pulse 72   Ht 5\' 2"  (1.575 m)   Wt 163 lb 3.2 oz (74 kg)   LMP 08/13/2019 (Approximate)   SpO2 97%   BMI 29.85 kg/m   Physical Exam Vitals and nursing note reviewed. Exam conducted with a chaperone present.  Constitutional:      General: She is not in acute distress.    Appearance: Normal appearance. She is normal weight. She is not ill-appearing, toxic-appearing or diaphoretic.  HENT:     Head: Normocephalic and atraumatic.  Eyes:     Conjunctiva/sclera: Conjunctivae normal.  Cardiovascular:     Rate and Rhythm: Normal rate and regular rhythm.     Pulses: Normal pulses.     Heart sounds: No murmur. No friction rub. No gallop.   Pulmonary:  Effort: Pulmonary effort is normal.     Breath sounds: Normal breath sounds. No wheezing, rhonchi or rales.  Abdominal:     General: Abdomen is flat. Bowel sounds are normal.     Palpations: Abdomen is soft.     Tenderness: There is no abdominal tenderness. There is no guarding.  Genitourinary:    Exam position: Knee-chest position.    Musculoskeletal:     Right lower leg: No edema.     Left lower leg: No edema.  Skin:    General: Skin is warm and dry.  Neurological:     General:  No focal deficit present.     Mental Status: She is alert and oriented to person, place, and time.  Psychiatric:        Mood and Affect: Mood normal.        Behavior: Behavior normal.    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  Abnormal cervical Papanicolaou smear Pap smear results from 08/20/19 NILM. HPV negative. Previously had LGSIL in 2017 with +HR HPV. - Repeat cotesting in 3 years.  Hypothyroidism Future lab order placed for TSH, patient should return on 09/23/19 for testing (6 weeks). Pending result, will let patient know if need to titrate meds. At this point, patient does not require an endocrinologist, however, based on these results will perform shared decision making with patient and assess her preferences.  Abdominal bloating with cramps Continue food/symptom diary. Lab results so far reassuringly normal. Patient does have possible adenomyosis, has seen her OB/GYN for this and is going back soon to discuss possible treatments. This could be the etiology of vague abdominal discomfort, could also be related to multifactorial process, including occasional ovarian cysts, but also stress and caregiver burn out. Will continue to follow, recommend patient keeps follow up with OB/GYN. - follow up prn for patient preference  Burnout of caregiver Patient has had some relief by paying a nurse to come assist her with enemas. She is concerned because the pediatric surgeon is recommending another surgery for her son, and she is worried about the outcome. However, since getting some help at home, she is feeling a little better. PHQ-9 improved from 12 to 6, GAD-7 only mild with result of 5. Still recommend patient see a therapist to help process emotions related to caring for a chronically ill child. She also has a another young child to care for. Patient was able to connect with Sammuel Hines in the clinic today and they set up  an appointment to chat on the phone.  Orthostatic hypotension Discussed mutlifactorial nature of POTS and plan to start with conservative measures to help her feel better. Treating her caregiver burnout will also likely help her become more functional as well. Recording a symptom diary is especially important to find which measures work. - Recommend increasing salt, drink 2L H2O per day - Record symptom diary - Can consider additional BP support if these conservative measures do not work.  Hemorrhoid Small external hemorrhoid present at 6 o'clock. No bleeding, some mild pain with defecation.  - Recommend sitz baths if patient becomes very painful/uncomfortable - Can try preparation H over the counter for relief if needed - Recommend increasing fiber in patient's diet: she can do this by increasing fruits and vegetables or start using a fiber drink such as miralax     Shirlean Mylar, MD [facility]

## 2019-09-11 NOTE — Assessment & Plan Note (Signed)
Small external hemorrhoid present at 6 o'clock. No bleeding, some mild pain with defecation.  - Recommend sitz baths if patient becomes very painful/uncomfortable - Can try preparation H over the counter for relief if needed - Recommend increasing fiber in patient's diet: she can do this by increasing fruits and vegetables or start using a fiber drink such as miralax

## 2019-09-11 NOTE — Assessment & Plan Note (Signed)
Discussed mutlifactorial nature of POTS and plan to start with conservative measures to help her feel better. Treating her caregiver burnout will also likely help her become more functional as well. Recording a symptom diary is especially important to find which measures work. - Recommend increasing salt, drink 2L H2O per day - Record symptom diary - Can consider additional BP support if these conservative measures do not work.

## 2019-09-11 NOTE — Assessment & Plan Note (Signed)
Future lab order placed for TSH, patient should return on 09/23/19 for testing (6 weeks). Pending result, will let patient know if need to titrate meds. At this point, patient does not require an endocrinologist, however, based on these results will perform shared decision making with patient and assess her preferences.

## 2019-09-11 NOTE — Assessment & Plan Note (Signed)
Patient has had some relief by paying a nurse to come assist her with enemas. She is concerned because the pediatric surgeon is recommending another surgery for her son, and she is worried about the outcome. However, since getting some help at home, she is feeling a little better. PHQ-9 improved from 12 to 6, GAD-7 only mild with result of 5. Still recommend patient see a therapist to help process emotions related to caring for a chronically ill child. She also has a another young child to care for. Patient was able to connect with Sammuel Hines in the clinic today and they set up an appointment to chat on the phone.

## 2019-09-11 NOTE — Assessment & Plan Note (Addendum)
Pap smear results from 08/20/19 NILM. HPV negative. Previously had LGSIL in 2017 with +HR HPV. - Repeat cotesting in 3 years.

## 2019-09-11 NOTE — Assessment & Plan Note (Signed)
Continue food/symptom diary. Lab results so far reassuringly normal. Patient does have possible adenomyosis, has seen her OB/GYN for this and is going back soon to discuss possible treatments. This could be the etiology of vague abdominal discomfort, could also be related to multifactorial process, including occasional ovarian cysts, but also stress and caregiver burn out. Will continue to follow, recommend patient keeps follow up with OB/GYN. - follow up prn for patient preference

## 2019-09-15 ENCOUNTER — Telehealth: Payer: Medicaid Other

## 2019-09-15 ENCOUNTER — Ambulatory Visit: Payer: Medicaid Other | Admitting: Licensed Clinical Social Worker

## 2019-09-15 DIAGNOSIS — Z730 Burn-out: Secondary | ICD-10-CM

## 2019-09-15 NOTE — Chronic Care Management (AMB) (Signed)
Care Management   Clinical Social Work Follow Up   09/15/2019 Name: Carol Welch MRN: [id number] DOB: 07/15/1975  Referred by: Shirlean Mylar, MD  Reason for referral : Care Coordination (caregiver stress)  Carol Welch is a 44 y.o. year old female who is a primary care patient of Shirlean Mylar, MD.  Reason for follow-up: Phone encounter with patient today for ongoing assessment and brief interventions to assist with managing caregiver stress.   Assessment: Patient continues to experience stress which are exacerbated by meeting the needs of her son.  Patient now has support from a family friend which seem to be working out well. .     Recommendation: Patient may benefit from, and is in agreement to seek ongoing counseling.  She had counseling for about a month 1/2 after the birth of her 43 year old and found it beneficial.    Review of patient status, including review of consultants reports, relevant laboratory and other test results, and collaboration with appropriate care team members and the patient's provider was performed as part of comprehensive patient evaluation and provision of care management services.    Advance Directive Status: Not addressed during this encounter. SDOH (Social Determinants of Health) assessments performed: Yes   Goals Addressed            This Visit's Progress   . connect with counseling       CARE PLAN ENTRY (see longitudinal plan of care for additional care plan information)  Current Barriers:  . Patient acknowledges deficits with connecting to mental health provider for ongoing counseling.  . Patient is experiencing symptoms of caregiver stress which seem to be exacerbated by caring for chronically ill son.     . Patient needs Support, Education, and Care Coordination in order to meet unmet mental health needs  Clinical Social Work Goal(s):  Marland Kitchen Over the next 30 days, patient will work with until connected for ongoing counseling resources.   . Patient will implement clinical interventions discussed today to decreases symptoms of stress and increase knowledge and/or ability of: coping skills, self-management skills, and stress reduction. Interventions:  . Assessed patient's understanding, education, previous treatment  . Discussed several options for long term counseling based on need and insurance. Assisted patient with narrowing the options down to [facility] and [facility]) . Provided patient with agencies and contact information . EMMI education provided on "Breathing to relax" Patient Self Care Activities & Deficits:  . Patient is unable to independently navigate community resource options without care coordination support . Patient is able to implement clinical interventions discussed today and is motivated for treatment  . Patient will select one of the agencies from the list provided and call to schedule an appointment  Initial goal documentation     . Reduce caregiver stress       CARE PLAN ENTRY (see longitudinal plan of care for additional care plan information)  Current Barriers & progress:  . Patient with caregiver stress needs community resources,  . Acknowledges deficits and needs support, education and care coordination in order to meet this unmet need  . Patient's son is not a patient at [facility], ( LCSW unable to make the [facility] referral) Clinical Goal(s)  . Over the next 45 days patient will be able to reduce stress as demonstrated by more self-care and community support Interventions provided by LCSW:  . patient reviewed information about [facility] and interesting in this support for her son Patient Self Care Activities & Deficits:  . Acknowledges  deficits and is motivated to resolve concerns  . Patient is able to contact [facility] and make a self referral . Please see past updates related to this goal by clicking on the "Past Updates" button in the selected goal       Outpatient Encounter Medications as of  09/15/2019  Medication Sig  . acetaminophen (TYLENOL) 500 MG tablet Take 1,000 mg by mouth every 6 (six) hours as needed for mild pain, moderate pain, fever or headache.   . drospirenone-ethinyl estradiol (YAZ,GIANVI,LORYNA) 3-0.02 MG tablet TK 1 T PO QD UTD  . ibuprofen (ADVIL,MOTRIN) 600 MG tablet Take 1 tablet (600 mg total) by mouth every 6 (six) hours as needed.  Marland Kitchen levothyroxine (SYNTHROID, LEVOTHROID) 100 MCG tablet Take 100 mcg by mouth daily before breakfast.    No facility-administered encounter medications on file as of 09/15/2019.   Plan: LCSW will F/U in one week  Casimer Lanius, [place] / [place]   [phone] 4:16 PM

## 2019-09-15 NOTE — Patient Instructions (Signed)
Licensed Clinical Social Worker Visit Information Carol Welch  it was nice speaking with you. Please call me directly if you have questions [phone] Goals we discussed today: getting you connected for ongoing counseling and support for your son Goals Addressed            This Visit's Progress   . connect with counseling       CARE PLAN ENTRY (see longitudinal plan of care for additional care plan information)  Current Barriers:  . Patient acknowledges deficits with connecting to mental health provider for ongoing counseling.  . Patient is experiencing symptoms of caregiver stress which seem to be exacerbated by caring for chronically ill son.     . Patient needs Support, Education, and Care Coordination in order to meet unmet mental health needs  Clinical Social Work Goal(s):  Marland Kitchen Over the next 30 days, patient will work with until connected for ongoing counseling resources.  . Patient will implement clinical interventions discussed today to decreases symptoms of stress and increase knowledge and/or ability of: coping skills, self-management skills, and stress reduction. Interventions:  . Assessed patient's understanding, education, previous treatment  . Discussed several options for long term counseling based on need and insurance. Assisted patient with narrowing the options down to [facility]) . Provided patient with agencies and contact information . EMMI education provided on "Breathing to relax" Patient Self Care Activities & Deficits:  . Patient is unable to independently navigate community resource options without care coordination support . Patient is able to implement clinical interventions discussed today and is motivated for treatment  . Patient will select one of the agencies from the list provided and call to schedule an appointment  Initial goal documentation     . Reduce caregiver stress       CARE PLAN ENTRY (see longitudinal plan of care for  additional care plan information)  Current Barriers & progress:  . Patient with caregiver stress needs community resources,  . Acknowledges deficits and needs support, education and care coordination in order to meet this unmet need  . Patient's son is not a patient at [facility], ( LCSW unable to make the [facility] referral) Clinical Goal(s)  . Over the next 45 days patient will be able to reduce stress as demonstrated by more self-care and community support Interventions provided by LCSW:  . patient reviewed information about [facility] and interesting in this support for her son Patient Self Care Activities & Deficits:  . Acknowledges deficits and is motivated to resolve concerns  . Patient is able to contact [facility] and make a self referral . Please see past updates related to this goal by clicking on the "Past Updates" button in the selected goal      Materials provided: Yes: counseling resources Carol Welch received Care Management services today:  1. Care Management services include personalized support from designated clinical staff supervised by her physician, including individualized plan of care and coordination with other care providers 2. 24/7 contact [phone] for assistance for urgent and routine care needs. 3. Care Management are voluntary services and be declined at any time by calling the office.  Patient verbally agreed to assistance and services provided by embedded care coordination/care management team today.   Patient verbalizes understanding of instructions provided today.  Follow up plan:  SW will follow up with patient by phone over the next week  Soundra Pilon, LCSW

## 2019-09-22 ENCOUNTER — Other Ambulatory Visit: Payer: Self-pay

## 2019-09-22 ENCOUNTER — Ambulatory Visit: Payer: Medicaid Other | Admitting: Licensed Clinical Social Worker

## 2019-09-22 DIAGNOSIS — Z730 Burn-out: Secondary | ICD-10-CM

## 2019-09-22 DIAGNOSIS — Z7189 Other specified counseling: Secondary | ICD-10-CM

## 2019-09-22 NOTE — Chronic Care Management (AMB) (Signed)
Care Management   Clinical Social Work Follow Up   09/22/2019 Name: Carol Welch MRN: [id number] DOB: 03-Sep-1975  Referred by: Shirlean Mylar, MD  Reason for referral : Care Coordination (for ongoing counseling) Carol Welch is a 44 y.o. year old female who is a primary care patient of Shirlean Mylar, MD.   Reason for follow-up: Phone encounter with patient today for ongoing assessment and brief interventions to assist with connecting for counseling to manage stress.    Assessment: Patient is making progress towards goal.  States she is starting to feel better with additional support. She has implemented relaxed breathing and states she believes this is also helping.   Review of patient status, including review of consultants reports, relevant laboratory and other test results, and collaboration with appropriate care team members and the patient's provider was performed as part of comprehensive patient evaluation and provision of care management services.    Advance Directive Status: Not addressed in this encounter.  SDOH (Social Determinants of Health) assessments performed: No   Goals Addressed            This Visit's Progress   . connect with counseling   On track    CARE PLAN ENTRY (see longitudinal plan of care for additional care plan information)  Current Barriers & progress:  . Patient acknowledges deficits with connecting to mental health provider for ongoing counseling.  . Patient is experiencing symptoms of caregiver stress which seem to be exacerbated by caring for chronically ill son.     . Patient needs Support, Education, and Care Coordination in order to meet unmet mental health needs  Clinical Social Work Goal(s):  Marland Kitchen Over the next 30 days, patient will work with until connected for ongoing counseling resources.  . Patient will implement clinical interventions discussed today to decreases symptoms of stress and increase knowledge and/or ability of: coping skills,  self-management skills, and stress reduction. Interventions:  . Assessed patient's current state and progress on previous interventions provided.   . Patient has decided on [facility] for ongoing counseling, Assisted patient with making referral to [employer]  . Reviewed EMMI education provided on "Breathing to relax".  Informed patient to increase to 3 times daily . Provided EMMI education on "Relieving Stress"  Patient Self Care Activities & Deficits:  . Patient is unable to independently navigate community resource options without care coordination support . Patient is able to implement clinical interventions discussed today and is motivated for treatment  . Patient will call to schedule an appointment if she has not heard from [facility] in one week Please see past updates related to this goal by clicking on the "Past Updates" button in the selected goal      . Reduce caregiver stress   On track    CARE PLAN ENTRY (see longitudinal plan of care for additional care plan information)  Current Barriers & progress:  . Patient with caregiver stress needs community resources,  . Acknowledges deficits and needs support, education and care coordination in order to meet this unmet need  . Patient made appointment with son's PCP to discuss [facility] referral and other options . Patient continues to have a friend help her two days a week which has helped with her burnout Clinical Goal(s)  . Over the next 45 days patient will be able to reduce stress as demonstrated by more self-care and community support Interventions provided by LCSW:  . patient reviewed information about [facility] and interesting in this support for her  son Patient Self Care Activities & Deficits:  . Acknowledges deficits and is motivated to resolve concerns  . Patient will discuss additional community options with son's PCP . Please see past updates related to this goal by clicking on the "Past Updates" button in the  selected goal       Outpatient Encounter Medications as of 09/22/2019  Medication Sig  . acetaminophen (TYLENOL) 500 MG tablet Take 1,000 mg by mouth every 6 (six) hours as needed for mild pain, moderate pain, fever or headache.   . drospirenone-ethinyl estradiol (YAZ,GIANVI,LORYNA) 3-0.02 MG tablet TK 1 T PO QD UTD  . ibuprofen (ADVIL,MOTRIN) 600 MG tablet Take 1 tablet (600 mg total) by mouth every 6 (six) hours as needed.  Marland Kitchen levothyroxine (SYNTHROID, LEVOTHROID) 100 MCG tablet Take 100 mcg by mouth daily before breakfast.    No facility-administered encounter medications on file as of 09/22/2019.   Plan: LCSW will F/U with patient in 2 weeks  Casimer Lanius, [place] / [place]   [phone] 9:35 AM

## 2019-09-22 NOTE — Patient Instructions (Signed)
Licensed Clinical Social Worker Visit Information Ms. Sekula  it was nice speaking with you. Please call me directly if you have questions [phone] Goals we discussed today:  Goals Addressed            This Visit's Progress   . connect with counseling   On track    CARE PLAN ENTRY (see longitudinal plan of care for additional care plan information)  Current Barriers & progress:  . Patient acknowledges deficits with connecting to mental health provider for ongoing counseling.  . Patient is experiencing symptoms of caregiver stress which seem to be exacerbated by caring for chronically ill son.     . Patient needs Support, Education, and Care Coordination in order to meet unmet mental health needs  Clinical Social Work Goal(s):  Marland Kitchen Over the next 30 days, patient will work with until connected for ongoing counseling resources.  . Patient will implement clinical interventions discussed today to decreases symptoms of stress and increase knowledge and/or ability of: coping skills, self-management skills, and stress reduction. Interventions:  . Assessed patient's current state and progress on previous interventions provided.   . Patient has decided on [facility] for ongoing counseling, Assisted patient with making referral to [employer]  . Reviewed EMMI education provided on "Breathing to relax".  Informed patient to increase to 3 times daily . Provided EMMI education on "Relieving Stress"  Patient Self Care Activities & Deficits:  . Patient is unable to independently navigate community resource options without care coordination support . Patient is able to implement clinical interventions discussed today and is motivated for treatment  . Patient will call to schedule an appointment if she has not heard from [facility] in one week Please see past updates related to this goal by clicking on the "Past Updates" button in the selected goal      . Reduce caregiver stress   On track     CARE PLAN ENTRY (see longitudinal plan of care for additional care plan information)  Current Barriers & progress:  . Patient with caregiver stress needs community resources,  . Acknowledges deficits and needs support, education and care coordination in order to meet this unmet need  . Patient made appointment with son's PCP to discuss [facility] referral and other options . Patient continues to have a friend help her two days a week which has helped with her burnout Clinical Goal(s)  . Over the next 45 days patient will be able to reduce stress as demonstrated by more self-care and community support Interventions provided by LCSW:  . patient reviewed information about [facility] and interesting in this support for her son Patient Self Care Activities & Deficits:  . Acknowledges deficits and is motivated to resolve concerns  . Patient will discuss additional community options with son's PCP . Please see past updates related to this goal by clicking on the "Past Updates" button in the selected goal      Materials provided: Yes: Relieving Stress video Ms. Arriola received Care Management services today:  1. Care Management services include personalized support from designated clinical staff supervised by her physician, including individualized plan of care and coordination with other care providers 2. 24/7 contact [phone] for assistance for urgent and routine care needs. 3. Care Management are voluntary services and be declined at any time by calling the office.  Patient  verbally agreed to assistance and services provided by embedded care coordination/care management team today.   Patient verbalizes understanding of instructions provided today.  Follow up plan:  SW will follow up with patient by phone over the next 2 weeks  Maurine Cane, LCSW

## 2019-09-23 ENCOUNTER — Other Ambulatory Visit: Payer: Medicaid Other

## 2019-09-24 LAB — TSH: TSH: 3.65 u[IU]/mL (ref 0.450–4.500)

## 2019-10-05 ENCOUNTER — Other Ambulatory Visit: Payer: Self-pay

## 2019-10-05 ENCOUNTER — Encounter: Payer: Self-pay | Admitting: Family Medicine

## 2019-10-05 ENCOUNTER — Ambulatory Visit (INDEPENDENT_AMBULATORY_CARE_PROVIDER_SITE_OTHER): Payer: Medicaid Other | Admitting: Family Medicine

## 2019-10-05 ENCOUNTER — Other Ambulatory Visit (HOSPITAL_COMMUNITY)
Admission: RE | Admit: 2019-10-05 | Discharge: 2019-10-05 | Disposition: A | Discharge status: Home / Self Care | Payer: Medicaid Other | Source: Ambulatory Visit | Attending: Family Medicine | Admitting: Family Medicine

## 2019-10-05 VITALS — BP 98/66 | HR 71 | Ht 62.0 in | Wt 161.8 lb

## 2019-10-05 DIAGNOSIS — N898 Other specified noninflammatory disorders of vagina: Secondary | ICD-10-CM | POA: Insufficient documentation

## 2019-10-05 DIAGNOSIS — R300 Dysuria: Secondary | ICD-10-CM | POA: Diagnosis not present

## 2019-10-05 DIAGNOSIS — M7989 Other specified soft tissue disorders: Secondary | ICD-10-CM | POA: Diagnosis not present

## 2019-10-05 LAB — POCT WET PREP (WET MOUNT)
Clue Cells Wet Prep Whiff POC: NEGATIVE
Trichomonas Wet Prep HPF POC: ABSENT
WBC, Wet Prep HPF POC: >20

## 2019-10-05 LAB — POCT URINALYSIS DIP (MANUAL ENTRY)
Bilirubin, UA: NEGATIVE
Blood, UA: NEGATIVE
Glucose, UA: NEGATIVE mg/dL
Ketones, POC UA: NEGATIVE mg/dL
Leukocytes, UA: NEGATIVE
Nitrite, UA: NEGATIVE
Protein Ur, POC: NEGATIVE mg/dL
Spec Grav, UA: 1.01 (ref 1.010–1.025)
Urobilinogen, UA: 0.2 E.U./dL
pH, UA: 6.5 (ref 5.0–8.0)

## 2019-10-05 MED ORDER — METRONIDAZOLE 500 MG PO TABS: 500.0000 mg | ORAL_TABLET | Freq: Two times a day (BID) | ORAL | 0 refills | Status: DC

## 2019-10-05 NOTE — Patient Instructions (Signed)
It was nice meeting you today Carol Welch!  Today, we are testing you for a vaginal infection that could be causing your symptoms.  Your urine looked normal, so you do not have a UTI.  We are also getting an ultrasound to look at the spot that you are concerned about on your abdomen.  I will let you know what all of your results are as soon as I see them.  If you have any questions or concerns, please feel free to call the clinic.   Be well,  Dr. Frances Furbish

## 2019-10-05 NOTE — Assessment & Plan Note (Signed)
UA was negative, however wet prep was positive for bacterial vaginosis.  GC and Chlamydia testing was also performed.  Patient was prescribed metronidazole 500 mg twice daily for 7 days for treatment of bacterial vaginosis.

## 2019-10-05 NOTE — Assessment & Plan Note (Signed)
It is possible that patient's mass could be related to her adenomyosis, although it is more superficial than I would expect for uterine enlargement.  Patient does have a history of chronic abdominal pain and bloating, which could be connected to this.  I am slightly concerned due to patient's report that the mass is enlarging and more painful now.  Would like to get an ultrasound of the soft tissue to investigate if there is any abnormality.  A CT scan may be warranted if the ultrasound is not helpful and the patient continues to have issues.

## 2019-10-05 NOTE — Progress Notes (Signed)
    SUBJECTIVE:   CHIEF COMPLAINT / HPI:   Vaginal irritation with abnormal discharge On May 10, patient had a biopsy of the endometrium or cervix and started bleeding more after about one week, which included blood clots about one week after the procedure.  She also noted abnormal vaginal discharge that is dark about one week ago and called the OB-GYN at that time.  Her bleeding resolved on Saturday (two days ago).  Has had cramping and also itching and irritation of the vulva.  She has one sexual partner and uses a birth control pills and was in the middle of the pack when these symptoms occurred.    Abdominal mass Patient says that she noted a small mass in her left lower abdomen about 6 months ago.  Since then, she says that it has been enlarging, and it has become painful and she presses on it.  She says that her husband can also feel it when he presses on the area.  She has had no trauma and has no scars in this area.  She has never had any imaging to look into this.  PERTINENT  PMH / PSH: PID, abdominal bloating  OBJECTIVE:   BP 98/66   Pulse 71   Ht 5\' 2"  (1.575 m)   Wt 161 lb 12.8 oz (73.4 kg)   LMP 09/07/2019 (Approximate)   SpO2 97%   BMI 29.59 kg/m   General: well appearing, appears stated age Cardiac: RRR, no MRG Respiratory: CTAB, no rhonchi, rales, or wheezing, normal work of breathing Abdomen: Soft, mildly tender to palpation in the left lower quadrant, no discrete mass palpated, but it may appear slightly more swollen on the left compared to the right. GU: Normal-appearing vulva, vagina, and cervix with copious yellow-green discharge, no cervical motion tenderness. Skin: no rashes or other lesions, warm and well perfused Psych: appropriate mood and affect    ASSESSMENT/PLAN:   Vaginal discharge UA was negative, however wet prep was positive for bacterial vaginosis.  GC and Chlamydia testing was also performed.  Patient was prescribed metronidazole 500 mg twice  daily for 7 days for treatment of bacterial vaginosis.  Soft tissue mass It is possible that patient's mass could be related to her adenomyosis, although it is more superficial than I would expect for uterine enlargement.  Patient does have a history of chronic abdominal pain and bloating, which could be connected to this.  I am slightly concerned due to patient's report that the mass is enlarging and more painful now.  Would like to get an ultrasound of the soft tissue to investigate if there is any abnormality.  A CT scan may be warranted if the ultrasound is not helpful and the patient continues to have issues.     11/07/2019, MD [facility]

## 2019-10-06 ENCOUNTER — Telehealth: Payer: Medicaid Other

## 2019-10-06 ENCOUNTER — Ambulatory Visit: Payer: Self-pay | Admitting: Licensed Clinical Social Worker

## 2019-10-06 LAB — CERVICOVAGINAL ANCILLARY ONLY
Chlamydia: NEGATIVE
Comment: NEGATIVE
Comment: NORMAL
Neisseria Gonorrhea: NEGATIVE

## 2019-10-06 NOTE — Chronic Care Management (AMB) (Signed)
    Clinical Social Work  Care Management Outreach   10/06/2019 Name: Carol Welch MRN: [id number] DOB: 12-30-1975 Carol Welch is a 44 y.o. year old female who is a primary care patient of Shirlean Mylar, MD .  The Care Management team was consulted for assistance with Mental Health Counseling and Resources.   LCSW reached out to Tyson Alias today by phone for ongoing assessment of needs.  The outreach was unsuccessful. A HIPPA compliant phone message was left for the patient providing contact information and requesting a return call.   LCSW also reached out to [facility] to see if appointment scheduled for patient.  LCSW received message from patient. See updated care plan with information from message.  Review of patient status, including review of consultants reports, relevant laboratory and other test results, and collaboration with appropriate care team members and the patient's provider was performed as part of comprehensive patient evaluation and provision of care management services.   Goals Addressed            This Visit's Progress   . connect with counseling   On track    CARE PLAN ENTRY (see longitudinal plan of care for additional care plan information)  Current Barriers & progress:  . Patient acknowledges deficits with connecting to mental health provider for ongoing counseling.  . Patient is experiencing symptoms of caregiver stress which seem to be exacerbated by caring for chronically ill son.     . Patient needs Support, Education, and Care Coordination in order to meet unmet mental health needs  . Received paperwork from [facility], will send it back. Reports she is doing much better Clinical Social Work Goal(s):  Marland Kitchen Over the next 30 days, patient will work with until connected for ongoing counseling resources.  . Patient will implement clinical interventions discussed today to decreases symptoms of stress and increase knowledge and/or ability of: coping  skills, self-management skills, and stress reduction. Interventions:  . Reviewed EMMI education provided on "Breathing to relax".  Informed patient to increase to 3 times daily . Provided EMMI education on "Relieving Stress"  Patient Self Care Activities & Deficits:  . Patient is unable to independently navigate community resource options without care coordination support . Patient is able to implement clinical interventions discussed today and is motivated for treatment  . Patient will call to schedule an appointment if she has not heard from [facility] in one week Please see past updates related to this goal by clicking on the "Past Updates" button in the selected goal      Follow Up Plan: LCSW will F/U with patient in two weeks.  Sammuel Hines, LCSW Chronic Care Coordination  [facility] / [facility]   [phone] 10:06 AM

## 2019-10-11 ENCOUNTER — Other Ambulatory Visit: Payer: Self-pay

## 2019-10-11 ENCOUNTER — Ambulatory Visit
Admission: RE | Admit: 2019-10-11 | Discharge: 2019-10-11 | Disposition: A | Discharge status: Home / Self Care | Payer: Medicaid Other | Source: Ambulatory Visit | Attending: Family Medicine | Admitting: Family Medicine

## 2019-10-11 DIAGNOSIS — M7989 Other specified soft tissue disorders: Secondary | ICD-10-CM

## 2019-10-14 ENCOUNTER — Encounter: Payer: Self-pay | Admitting: Family Medicine

## 2019-10-18 NOTE — Progress Notes (Signed)
Discussed reassuring results with Carol Welch.  Offered either to watch and wait work to go ahead with the CT scan.  Discussed the pros and cons of each.  She elected to watch and wait for now.

## 2019-10-20 ENCOUNTER — Other Ambulatory Visit: Payer: Self-pay

## 2019-10-20 ENCOUNTER — Ambulatory Visit: Payer: Medicaid Other | Admitting: Licensed Clinical Social Worker

## 2019-10-20 DIAGNOSIS — Z636 Dependent relative needing care at home: Secondary | ICD-10-CM

## 2019-10-20 DIAGNOSIS — Z7189 Other specified counseling: Secondary | ICD-10-CM

## 2019-10-20 NOTE — Chronic Care Management (AMB) (Signed)
Care Management   Clinical Social Work Follow Up   10/20/2019 Name: Ceanna Wareing MRN: [id number] DOB: Sep 05, 1975 Referred by: Gladys Damme, MD  Reason for referral : Care Coordination (to connect for counseling) Shaquanda Graves is a 44 y.o. year old female who is a primary care patient of Gladys Damme, MD.   Reason for follow-up: Phone encounter with patient today to assess barriers and progress with connecting for ongoing counseling. Assessment: Patient is making progress towards goals ( see care plan below).  States she continues to feel better.  Recommendation:  Intervention: Assessment of needs and barriers;   Provided patient with information about Advance Directive  Collaborated with [place]  re: getting patient connected with first counseling appointment  Other interventions include:Solution-Focused Strategies  Plan:  1. Patient will keep counseling appointment 2.   Patient review Advance Directive, complete 3.   No F/U scheduled, patient will call LCSW if needed  Advance Directive Status: [place] for related entries.  SDOH (Social Determinants of Health) assessments performed: No needs identified   Goals Addressed            This Visit's Progress   . Advance Directives       CARE PLAN ENTRY (see longitudinal plan of care for additional care plan information)  Current Barriers:  . Patient does not have an Forensic scientist . Acknowledges deficits, education and support in order to complete this document Clinical Social Work Goal(s):  Over the next 45 days, the patient will  . review EMMI education on Advance Directive as evidenced by patient self report of review . complete Advance Directive, have notarized and provide a copy to provider office Interventions provided by LCSW: . A voluntary discussion about advanced care planning including importance of advanced directives, healthcare proxy and living will was discussed with the patient   . Mailed the patient an Emergency planning/management officer . Advised patient to review information and call LCSW with questions Patient Self Care Activities:  . Is able to complete documentation independently . Able to identify next of kin or [place] . Patient will bring completed form to next office visit  Initial goal documentation    . COMPLETED: connect with counseling   On track    [place] (see longitudinal plan of care for additional care plan information)  Current Barriers & progress:  . Patient acknowledges deficits with connecting to mental health provider for ongoing counseling.  . Patient is experiencing symptoms of caregiver stress which seem to be exacerbated by caring for chronically ill son.     . Patient needs Support, Education, and Care Coordination in order to meet unmet mental health needs  . Initial appointment with [facility] [place] is scheduled 10/21/2019 . Patient reports she is doing much better Clinical Social Work Goal(s):  Marland Kitchen Over the next 30 days, patient will work with until connected for ongoing counseling resources.  . Patient will implement clinical interventions discussed today to decreases symptoms of stress and increase knowledge and/or ability of: coping skills, self-management skills, and stress reduction. Interventions:  . Assess barriers, progress and ongoing needs Patient Self Care Activities & Deficits:  . Patient is unable to independently navigate community resource options without care coordination support . Patient is motivated for treatment  . Patient will call to schedule keep appointment with [facility]  Please see past updates related to this goal by clicking on the "Past Updates" button in the selected goal      .  COMPLETED: Reduce caregiver stress       CARE PLAN ENTRY (see longitudinal plan of care for additional care plan information)  Current Barriers & progress:  . Patient with caregiver  stress needs community resources,  . Acknowledges deficits and needs support, education and care coordination in order to meet this unmet need  . Patient continues to have a friend help her two days a week with son states she is doing much better Clinical Goal(s)  . Over the next 45 days patient will be able to reduce stress as demonstrated by more self-care and community support Interventions provided by LCSW:  . Assessed for barriers, progress and needs Patient Self Care Activities & Deficits:  . Reports no needs at this time . Please see past updates related to this goal by clicking on the "Past Updates" button in the selected goal       Outpatient Encounter Medications as of 10/20/2019  Medication Sig  . acetaminophen (TYLENOL) 500 MG tablet Take 1,000 mg by mouth every 6 (six) hours as needed for mild pain, moderate pain, fever or headache.   . drospirenone-ethinyl estradiol (YAZ,GIANVI,LORYNA) 3-0.02 MG tablet TK 1 T PO QD UTD  . ibuprofen (ADVIL,MOTRIN) 600 MG tablet Take 1 tablet (600 mg total) by mouth every 6 (six) hours as needed.  Marland Kitchen levothyroxine (SYNTHROID, LEVOTHROID) 100 MCG tablet Take 100 mcg by mouth daily before breakfast.   . metroNIDAZOLE (FLAGYL) 500 MG tablet Take 1 tablet (500 mg total) by mouth 2 (two) times daily.   No facility-administered encounter medications on file as of 10/20/2019.   Review of patient status, including review of consultants reports, relevant laboratory and other test results, and collaboration with appropriate care team members and the patient's provider was performed as part of comprehensive patient evaluation and provision of care management services.    Sammuel Hines, LCSW Care Management  [facility]   [phone] 10:09 AM

## 2019-10-22 ENCOUNTER — Other Ambulatory Visit: Payer: Self-pay

## 2019-10-22 ENCOUNTER — Other Ambulatory Visit (HOSPITAL_COMMUNITY)
Admission: RE | Admit: 2019-10-22 | Discharge: 2019-10-22 | Disposition: A | Discharge status: Home / Self Care | Payer: Medicaid Other | Source: Ambulatory Visit | Attending: Family Medicine | Admitting: Family Medicine

## 2019-10-22 ENCOUNTER — Ambulatory Visit (INDEPENDENT_AMBULATORY_CARE_PROVIDER_SITE_OTHER): Payer: Medicaid Other | Admitting: Family Medicine

## 2019-10-22 VITALS — BP 122/80 | HR 64 | Temp 98.6°F | Ht 62.0 in | Wt 161.2 lb

## 2019-10-22 DIAGNOSIS — G4489 Other headache syndrome: Secondary | ICD-10-CM | POA: Diagnosis not present

## 2019-10-22 DIAGNOSIS — R102 Pelvic and perineal pain: Secondary | ICD-10-CM | POA: Insufficient documentation

## 2019-10-22 DIAGNOSIS — T50905A Adverse effect of unspecified drugs, medicaments and biological substances, initial encounter: Secondary | ICD-10-CM | POA: Insufficient documentation

## 2019-10-22 LAB — POCT WET PREP (WET MOUNT)
Clue Cells Wet Prep Whiff POC: NEGATIVE
Trichomonas Wet Prep HPF POC: ABSENT

## 2019-10-22 MED ORDER — DIPHENHYDRAMINE HCL 50 MG/ML IJ SOLN
50.0000 mg | Freq: Once | INTRAMUSCULAR | Status: AC
  Administered 2019-10-22: 50 mg via INTRAMUSCULAR

## 2019-10-22 MED ORDER — METRONIDAZOLE 500 MG PO TABS: 500.0000 mg | ORAL_TABLET | Freq: Two times a day (BID) | ORAL | 0 refills | Status: AC

## 2019-10-22 MED ORDER — CEFTRIAXONE SODIUM 500 MG IJ SOLR
500.0000 mg | Freq: Once | INTRAMUSCULAR | Status: AC
  Administered 2019-10-22: 500 mg via INTRAMUSCULAR

## 2019-10-22 MED ORDER — DOXYCYCLINE HYCLATE 100 MG PO TABS: 100.0000 mg | ORAL_TABLET | Freq: Two times a day (BID) | ORAL | 0 refills | Status: AC

## 2019-10-22 NOTE — Patient Instructions (Addendum)
It was nice to meet you today,   I will prescribe you 14 days of metronidazole and doxycycline.  Please take them each twice a day.  We will give you a shot of ceftriaxone today.    I will call you when I get the results of your test.  If you are still having fevers and headache after your menstrual cycle is over please schedule another appointment with [place].  Please take Tylenol as your main treatment for the headaches.  Try to limit your Advil intake to less than 1800 mg a day which is roughly 600 mg 3 times a day.  Have a great day,  Frederic Jericho, MD

## 2019-10-22 NOTE — Assessment & Plan Note (Signed)
Patient having headaches and fever with the last 2 menstrual periods.  Has a diagnosis of endometriosis for which she is getting a hysterectomy and August.  Is currently on birth control and periods are regular.  Headaches could be explained by menstrual related migraine/headache.  Somewhat unusual should be experienced at this point in her life without having experienced them before.  Uncertain of the cause of her fever.  Although there is some evidence of women having fevers during menses, there is concern, especially with her cervical motion tenderness and adnexal tenderness, that she is developing pelvic inflammatory disease.  Therefore we obtained a GC/chlamydia swab and treated her empirically for PID with 1 dose of ceftriaxone followed by 14 days each of doxycycline and metronidazole twice daily.  After given the ceftriaxone the patient was observed for 15 minutes and she developed a rash on her arm that resolved without treatment as well as sensation of difficulty breathing and difficulty swallowing.  Patient was given 50 mg of Benadryl and observed for another 15 minutes.  Patient stated she felt much better and she was allowed to go home with somebody else driving.  Advised patient to take Benadryl and seek urgent care or ED if the symptoms return.

## 2019-10-22 NOTE — Progress Notes (Signed)
° ° °  SUBJECTIVE:   CHIEF COMPLAINT / HPI:   Headache and fever: Patient describes having a headache that coincides with her last 2 menstrual periods that also develops into a fever as well.  She never had this issue prior to the last 2 periods.  She has a hysterectomy scheduled for August for endometriosis.  Her periods are still regular usually lasting 5 days and occurring monthly.  She is currently on day 3 of this period.  She is currently taking Yaz birth control which she has been on for 2 years.  She describes the fever as "all over" she denies vomiting, but does endorse some nausea.  No sensitivity to light or sounds.  She denies vision changes.  She states over the last 2 days she has had a fever with a T-max being 101.9 orally.  She states that when she was lying on her side this morning her head on the side she was lying felt "numb".  She has not had any sick contacts.  She has been taking Tylenol and Advil for the pain.  She sometimes has painful bowel movements during her periods.  Intercourse is "always painful".  She had her first dose of [employer] vaccine on June 3.  PERTINENT  PMH / PSH: Endometriosis, hypothyroidism  OBJECTIVE:   BP 122/80    Pulse 64    Temp 98.6 F (37 C) (Oral)    Ht 5\' 2"  (1.575 m)    Wt 161 lb 3.2 oz (73.1 kg)    LMP 10/20/2019 (Exact Date)    SpO2 98%    BMI 29.48 kg/m   General: Alert.  No acute distress. HEENT: Normocephalic, atraumatic.  No subconjunctival injection.  No facial droop CV: Regular rate and rhythm, no murmurs Pulmonary: Lungs clear to auscultation bilaterally. Abdominal: Tender to palpation in the right lower quadrant, left lower quadrant and suprapubic region. GU: Normal external vagina and vaginal vault.  Blood surrounding the cervix and vaginal vault.  Tenderness on bimanual exam of the cervix and adnexal region.  ASSESSMENT/PLAN:   Other headache syndrome Patient having headaches and fever with the last 2 menstrual periods.  Has a  diagnosis of endometriosis for which she is getting a hysterectomy and August.  Is currently on birth control and periods are regular.  Headaches could be explained by menstrual related migraine/headache.  Somewhat unusual should be experienced at this point in her life without having experienced them before.  Uncertain of the cause of her fever.  Although there is some evidence of women having fevers during menses, there is concern, especially with her cervical motion tenderness and adnexal tenderness, that she is developing pelvic inflammatory disease.  Therefore we obtained a GC/chlamydia swab and treated her empirically for PID with 1 dose of ceftriaxone followed by 14 days each of doxycycline and metronidazole twice daily.  After given the ceftriaxone the patient was observed for 15 minutes and she developed a rash on her arm that resolved without treatment as well as sensation of difficulty breathing and difficulty swallowing.  Patient was given 50 mg of Benadryl and observed for another 15 minutes.  Patient stated she felt much better and she was allowed to go home with somebody else driving.  Advised patient to take Benadryl and seek urgent care or ED if the symptoms return.     September, MD [facility]

## 2019-10-25 LAB — CERVICOVAGINAL ANCILLARY ONLY
Chlamydia: NEGATIVE
Comment: NEGATIVE
Comment: NORMAL
Neisseria Gonorrhea: NEGATIVE

## 2019-11-04 DIAGNOSIS — Z419 Encounter for procedure for purposes other than remedying health state, unspecified: Secondary | ICD-10-CM | POA: Diagnosis not present

## 2019-11-05 ENCOUNTER — Encounter: Payer: Self-pay | Admitting: Family Medicine

## 2019-11-05 ENCOUNTER — Other Ambulatory Visit: Payer: Self-pay

## 2019-11-05 ENCOUNTER — Ambulatory Visit (INDEPENDENT_AMBULATORY_CARE_PROVIDER_SITE_OTHER): Payer: Medicaid Other | Admitting: Family Medicine

## 2019-11-05 VITALS — BP 102/60 | HR 66 | Wt 164.0 lb

## 2019-11-05 DIAGNOSIS — R140 Abdominal distension (gaseous): Secondary | ICD-10-CM

## 2019-11-05 DIAGNOSIS — R1011 Right upper quadrant pain: Secondary | ICD-10-CM

## 2019-11-05 DIAGNOSIS — E039 Hypothyroidism, unspecified: Secondary | ICD-10-CM

## 2019-11-05 DIAGNOSIS — R102 Pelvic and perineal pain: Secondary | ICD-10-CM

## 2019-11-05 DIAGNOSIS — R87612 Low grade squamous intraepithelial lesion on cytologic smear of cervix (LGSIL): Secondary | ICD-10-CM

## 2019-11-05 DIAGNOSIS — Z8279 Family history of other congenital malformations, deformations and chromosomal abnormalities: Secondary | ICD-10-CM | POA: Diagnosis not present

## 2019-11-05 DIAGNOSIS — R109 Unspecified abdominal pain: Secondary | ICD-10-CM | POA: Diagnosis not present

## 2019-11-05 DIAGNOSIS — F331 Major depressive disorder, recurrent, moderate: Secondary | ICD-10-CM | POA: Diagnosis not present

## 2019-11-05 MED ORDER — ESCITALOPRAM OXALATE 10 MG PO TABS: 10.0000 mg | ORAL_TABLET | Freq: Every day | ORAL | 0 refills | Status: DC

## 2019-11-05 NOTE — Patient Instructions (Addendum)
It was a pleasure to see you today!  For your depression we started lexapro, an SSRI medication. Please take 1 pill (10 mg) every day for 7 days, then take 2 pills (20 mg) daily until you finish the bottle. I will call you in one week to check in and see how you are doing. If you have problems, such as trouble buttoning a button, please call our office at [phone].  I am testing some liver function labs as well as your thyroid and will let you know the results.  Be Well!  Dr. Leary Roca  If you are feeling suicidal or depression symptoms worsen please immediately go to:   24 Hour Availability [facility]  [address], [place], [place] [address]  [phone] or [phone]  . If you are thinking about harming yourself or having thoughts of suicide, or if you know someone who is, seek help right away. . Call your doctor or mental health care provider. . Call 911 or go to a hospital emergency room to get immediate help, or ask a friend or family member to help you do these things. . Call the [place] National Suicide Prevention Lifeline's toll-free, 24-hour hotline at 1-800-273-TALK 667-191-8913) or TTY: 1-800-799-4 TTY 214 646 1908) to talk to a trained counselor. . If you are in crisis, make sure you are not left alone.  . If someone else is in crisis, make sure he or she is not left alone  [facility] [employer] (Domestic Violence, Rape & Victim Assistance [phone]  [facility]  [address], [place]  [address]               [phone] or [phone]  [facility]    (ONLY from 8am-4pm)    [phone]  [facility] (24/7)   [phone]  [place] National Suicide Hotline   (743) 813-9410 Len Childs)   Escitalopram tablets What is this medicine? ESCITALOPRAM (es sye TAL oh pram) is used to treat depression and certain types of anxiety. This  medicine may be used for other purposes; ask your health care provider or pharmacist if you have questions. COMMON BRAND NAME(S): Lexapro What should I tell my health care provider before I take this medicine? They need to know if you have any of these conditions:  bipolar disorder or a family history of bipolar disorder  diabetes  glaucoma  heart disease  kidney or liver disease  receiving electroconvulsive therapy  seizures (convulsions)  suicidal thoughts, plans, or attempt by you or a family member  an unusual or allergic reaction to escitalopram, the related drug citalopram, other medicines, foods, dyes, or preservatives  pregnant or trying to become pregnant  breast-feeding How should I use this medicine? Take this medicine by mouth with a glass of water. Follow the directions on the prescription label. You can take it with or without food. If it upsets your stomach, take it with food. Take your medicine at regular intervals. Do not take it more often than directed. Do not stop taking this medicine suddenly except upon the advice of your doctor. Stopping this medicine too quickly may cause serious side effects or your condition may worsen. A special MedGuide will be given to you by the pharmacist with each prescription and refill. Be sure to read this information carefully each time. Talk to your pediatrician regarding the use of this medicine in children. Special care may be needed. Overdosage: If  you think you have taken too much of this medicine contact a poison control center or emergency room at once. NOTE: This medicine is only for you. Do not share this medicine with others. What if I miss a dose? If you miss a dose, take it as soon as you can. If it is almost time for your next dose, take only that dose. Do not take double or extra doses. What may interact with this medicine? Do not take this medicine with any of the following medications:  certain medicines for  fungal infections like fluconazole, itraconazole, ketoconazole, posaconazole, voriconazole  cisapride  citalopram  dronedarone  linezolid  MAOIs like Carbex, Eldepryl, Marplan, Nardil, and Parnate  methylene blue (injected into a vein)  pimozide  thioridazine This medicine may also interact with the following medications:  alcohol  amphetamines  aspirin and aspirin-like medicines  carbamazepine  certain medicines for depression, anxiety, or psychotic disturbances  certain medicines for migraine headache like almotriptan, eletriptan, frovatriptan, naratriptan, rizatriptan, sumatriptan, zolmitriptan  certain medicines for sleep  certain medicines that treat or prevent blood clots like warfarin, enoxaparin, dalteparin  cimetidine  diuretics  dofetilide  fentanyl  furazolidone  isoniazid  lithium  metoprolol  NSAIDs, medicines for pain and inflammation, like ibuprofen or naproxen  other medicines that prolong the QT interval (cause an abnormal heart rhythm)  procarbazine  rasagiline  supplements like St. John's wort, kava kava, valerian  tramadol  tryptophan  ziprasidone This list may not describe all possible interactions. Give your health care provider a list of all the medicines, herbs, non-prescription drugs, or dietary supplements you use. Also tell them if you smoke, drink alcohol, or use illegal drugs. Some items may interact with your medicine. What should I watch for while using this medicine? Tell your doctor if your symptoms do not get better or if they get worse. Visit your doctor or health care professional for regular checks on your progress. Because it may take several weeks to see the full effects of this medicine, it is important to continue your treatment as prescribed by your doctor. Patients and their families should watch out for new or worsening thoughts of suicide or depression. Also watch out for sudden changes in feelings such  as feeling anxious, agitated, panicky, irritable, hostile, aggressive, impulsive, severely restless, overly excited and hyperactive, or not being able to sleep. If this happens, especially at the beginning of treatment or after a change in dose, call your health care professional. Bonita Quin may get drowsy or dizzy. Do not drive, use machinery, or do anything that needs mental alertness until you know how this medicine affects you. Do not stand or sit up quickly, especially if you are an older patient. This reduces the risk of dizzy or fainting spells. Alcohol may interfere with the effect of this medicine. Avoid alcoholic drinks. Your mouth may get dry. Chewing sugarless gum or sucking hard candy, and drinking plenty of water may help. Contact your doctor if the problem does not go away or is severe. What side effects may I notice from receiving this medicine? Side effects that you should report to your doctor or health care professional as soon as possible:  allergic reactions like skin rash, itching or hives, swelling of the face, lips, or tongue  anxious  black, tarry stools  changes in vision  confusion  elevated mood, decreased need for sleep, racing thoughts, impulsive behavior  eye pain  fast, irregular heartbeat  feeling faint or lightheaded, falls  feeling  agitated, angry, or irritable  hallucination, loss of contact with reality  loss of balance or coordination  loss of memory  painful or prolonged erections  restlessness, pacing, inability to keep still  seizures  stiff muscles  suicidal thoughts or other mood changes  trouble sleeping  unusual bleeding or bruising  unusually weak or tired  vomiting Side effects that usually do not require medical attention (report to your doctor or health care professional if they continue or are bothersome):  changes in appetite  change in sex drive or performance  headache  increased sweating  indigestion,  nausea  tremors This list may not describe all possible side effects. Call your doctor for medical advice about side effects. You may report side effects to FDA at 1-800-FDA-1088. Where should I keep my medicine? Keep out of reach of children. Store at room temperature between 15 and 30 degrees C (59 and 86 degrees F). Throw away any unused medicine after the expiration date. NOTE: This sheet is a summary. It may not cover all possible information. If you have questions about this medicine, talk to your doctor, pharmacist, or health care provider.  2020 Elsevier/Gold Standard (2018-04-13 11:21:44)

## 2019-11-06 LAB — COMPREHENSIVE METABOLIC PANEL
ALT: 9 IU/L (ref 0–32)
AST: 13 IU/L (ref 0–40)
Albumin/Globulin Ratio: 1.5 (ref 1.2–2.2)
Albumin: 3.8 g/dL (ref 3.8–4.8)
Alkaline Phosphatase: 39 IU/L — ABNORMAL LOW (ref 48–121)
BUN/Creatinine Ratio: 29 — ABNORMAL HIGH (ref 9–23)
BUN: 19 mg/dL (ref 6–24)
Bilirubin Total: <0.2 mg/dL (ref 0.0–1.2)
CO2: 22 mmol/L (ref 20–29)
Calcium: 8.7 mg/dL (ref 8.7–10.2)
Chloride: 105 mmol/L (ref 96–106)
Creatinine, Ser: 0.65 mg/dL (ref 0.57–1.00)
GFR calc Af Amer: 127 mL/min/{1.73_m2} (ref >59)
GFR calc non Af Amer: 110 mL/min/{1.73_m2} (ref >59)
Globulin, Total: 2.6 g/dL (ref 1.5–4.5)
Glucose: 89 mg/dL (ref 65–99)
Potassium: 4.5 mmol/L (ref 3.5–5.2)
Sodium: 140 mmol/L (ref 134–144)
Total Protein: 6.4 g/dL (ref 6.0–8.5)

## 2019-11-06 LAB — TSH: TSH: 1.39 u[IU]/mL (ref 0.450–4.500)

## 2019-11-06 LAB — T4, FREE: Free T4: 1.56 ng/dL (ref 0.82–1.77)

## 2019-11-06 NOTE — Progress Notes (Addendum)
SUBJECTIVE:   CHIEF COMPLAINT / HPI: f/u abdominal bloating, hypothyroidism  Abdominal bloating and pain: patient presents today with complaint of mild pain in abdomen with bloating. This is a chronic complaint that comes and goes intermittently. Pain is dull and aching, she does not notice any patterns with any of the food that she currently eats. I have recommended keeping a food diary in the past, she did not bring with her today. She avoids red meats, dairy, and foods made with flour as she notices she has more pain/bloating with these foods, but she has not eaten them in some time. Patient has had multiple abdominal images performed.  Most recently on June 7 she had a limited ultrasound of the pelvis for palpable fullness of the lower pelvis, which did not reveal any particular abnormalities, but was limited in scope.  She did have a pelvic ultrasound done with Dr. Ellyn Hack, OB/GYN, in May of this year which reportedly revealed adenomyosis, for which she is having an hysterectomy on August 26.  Hypothyroidism: TSH last checked on May 20 and had improved from 0.279 in April to 3.65.  Plan to obtain TSH and free T4 today to ensure that her level is appropriate and has not continued to rise since last month.  Caregiver burnout: Patient is starting to get help at home with a nurse who helps her give the enema to her 8-year-old son.  This is helped improved her burden at home and her stress level.  She has also been seeing a therapist from the resources that were given to her by her social worker, Sammuel Hines.  She reports that her therapy has been going well.  However she still has a repeat PHQ-9 at a level of 13 today, an increase from 12 at her last visit in June.  Patient would like to start medication, specifically an SSRI.  She has used Zoloft in the past without problem, however she gained weight with that drug, and she is working hard not to gain weight at this point (she lost 20 pounds since  last year).  MDQ negative for history of BPD.  Patient is worried about planning for her hysterectomy and she knows she will have to have a few days to rest and recover.  I recommended she talk to with her husband, her older children, her therapist, and her friends to help figure out a plan to best support her for the days immediately following the surgery.  PERTINENT  PMH / PSH: Hypothyroidism, low blood pressure, adenomyoma,   OBJECTIVE:   BP 102/60   Pulse 66   Wt 164 lb (74.4 kg)   LMP 10/20/2019 (Exact Date)   SpO2 99%   BMI 30.00 kg/m   Physical Exam Vitals and nursing note reviewed.  Constitutional:      General: She is not in acute distress.    Appearance: Normal appearance. She is obese. She is not ill-appearing.  HENT:     Head: Normocephalic and atraumatic.     Mouth/Throat:     Mouth: Mucous membranes are moist.  Eyes:     Pupils: Pupils are equal, round, and reactive to light.  Cardiovascular:     Rate and Rhythm: Normal rate and regular rhythm.     Pulses: Normal pulses.     Heart sounds: Normal heart sounds. No murmur heard.  No friction rub. No gallop.   Pulmonary:     Effort: Pulmonary effort is normal. No respiratory distress.  Breath sounds: Normal breath sounds. No wheezing or rhonchi.  Abdominal:     General: Abdomen is flat. Bowel sounds are normal. There is no distension.     Palpations: Abdomen is soft. There is no mass.     Tenderness: There is abdominal tenderness. There is no rebound.     Comments: Mild tenderness in suprapubic region, mild tenderness in RUQ. Neg murphy's sign. No HSM. No rebound or guarding.  Musculoskeletal:        General: No swelling.     Right lower leg: No edema.     Left lower leg: No edema.  Skin:    General: Skin is warm and dry.  Neurological:     General: No focal deficit present.     Mental Status: She is alert and oriented to person, place, and time. Mental status is at baseline.  Psychiatric:        Behavior:  Behavior normal.    Depression screen [facility] 2/9 11/05/2019 11/05/2019 10/22/2019 10/05/2019 09/11/2019  Decreased Interest 2 0 1 1 1   Down, Depressed, Hopeless 3 1 1  0 1  PHQ - 2 Score 5 1 2 1 2   Altered sleeping 1 - 1 - 1  Tired, decreased energy 2 - 1 - 1  Change in appetite 2 - 2 - 1  Feeling bad or failure about yourself  1 - 1 - 0  Trouble concentrating 1 - 3 - 1  Moving slowly or fidgety/restless 1 - 2 - 0  Suicidal thoughts 0 - 0 - 0  PHQ-9 Score 13 - 12 - 6  Difficult doing work/chores - - Somewhat difficult - -   ASSESSMENT/PLAN:  Carol Welch is a 44 yo woman with MDD, adenomyoma, low blood pressure, and abdominal pain/fullness.  Hypothyroidism Patient initially diagnosed in 2001 and had been taking 100 mcg of levothyroxine that had not been checked in many years.  TSH in April 2021 was low to 0.279, repeat TSH in May 2021 after decreasing levothyroxine to 50 mcg daily was increased to 3.650.  Repeat TSH and free free T4 today appropriate and within normal limits at 1.39 and 1.56 respectively.  No change in management at this time as patient is well controlled.  Abdominal bloating with cramps Symptoms are still vague and are not associated with any particular activity.  Because patient is in the demographic that is known to get gallbladder stones, did consider this on the differential.  Patient has had a CT and 2020 which did not show any gallstones at that point in time.  She also has adenomyosis, for which she is having a hysterectomy in August.  Suspect some of her abdominal pain particularly lower pelvic pain and discomfort, is likely coming from this.  CMP obtained today as this had not yet been checked, is reassuringly normal for total bilirubin.  Patient does have a mildly low alkaline phosphatase, which is known to cooccur with hypothyroidism, which is the most likely cause for this.  If patient continues to have right upper quadrant pain, can consider getting a right upper quadrant  ultrasound to assess for gallstones.  Pelvic pain Patient to have hysterectomy on December 30, 2019, for adenomyosis.  MDD (major depressive disorder) Patient has MDD due to persistent over many months low mood that has worsened despite getting help at home to reduce stress and starting therapy.  No HI/SI present.  MDQ negative for BPD.  She requests an SSRI, and has used Zoloft in the past.  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  Prescribed Lexapro as it has the least side effects of any SSRI.  Discussed risks and benefits of medication including SI, gave patient resources of national hotline and recommend she continue therapy and discussion with her husband and friends for support. -Lexapro 10 mg daily x1 week, followed by 20 mg daily thereafter -Will call and check in on patient in 1 week     Shirlean Mylar, MD [facility]

## 2019-11-06 NOTE — Assessment & Plan Note (Signed)
Patient to have hysterectomy on December 30, 2019, for adenomyosis.

## 2019-11-06 NOTE — Assessment & Plan Note (Signed)
Patient initially diagnosed in 2001 and had been taking 100 mcg of levothyroxine that had not been checked in many years.  TSH in April 2021 was low to 0.279, repeat TSH in May 2021 after decreasing levothyroxine to 50 mcg daily was increased to 3.650.  Repeat TSH and free free T4 today appropriate and within normal limits at 1.39 and 1.56 respectively.  No change in management at this time as patient is well controlled.

## 2019-11-06 NOTE — Assessment & Plan Note (Signed)
Symptoms are still vague and are not associated with any particular activity.  Because patient is in the demographic that is known to get gallbladder stones, did consider this on the differential.  Patient has had a CT and 2020 which did not show any gallstones at that point in time.  She also has adenomyosis, for which she is having a hysterectomy in August.  Suspect some of her abdominal pain particularly lower pelvic pain and discomfort, is likely coming from this.  CMP obtained today as this had not yet been checked, is reassuringly normal for total bilirubin.  Patient does have a mildly low alkaline phosphatase, which is known to cooccur with hypothyroidism, which is the most likely cause for this.  If patient continues to have right upper quadrant pain, can consider getting a right upper quadrant ultrasound to assess for gallstones.

## 2019-11-06 NOTE — Assessment & Plan Note (Signed)
Patient has MDD due to persistent over many months low mood that has worsened despite getting help at home to reduce stress and starting therapy.  No HI/SI present.  MDQ negative for BPD.  She requests an SSRI, and has used Zoloft in the past.  She has had postpartum depression in the past as well.  Prescribed Lexapro as it has the least side effects of any SSRI.  Discussed risks and benefits of medication including SI, gave patient resources of national hotline and recommend she continue therapy and discussion with her husband and friends for support. -Lexapro 10 mg daily x1 week, followed by 20 mg daily thereafter -Will call and check in on patient in 1 week

## 2019-11-17 DIAGNOSIS — R102 Pelvic and perineal pain: Secondary | ICD-10-CM | POA: Diagnosis not present

## 2019-11-19 DIAGNOSIS — F331 Major depressive disorder, recurrent, moderate: Secondary | ICD-10-CM | POA: Diagnosis not present

## 2019-11-22 DIAGNOSIS — F331 Major depressive disorder, recurrent, moderate: Secondary | ICD-10-CM | POA: Diagnosis not present

## 2019-11-29 ENCOUNTER — Ambulatory Visit: Payer: Medicaid Other | Admitting: Family Medicine

## 2019-11-30 ENCOUNTER — Ambulatory Visit: Payer: Medicaid Other | Admitting: Family Medicine

## 2019-12-03 DIAGNOSIS — F331 Major depressive disorder, recurrent, moderate: Secondary | ICD-10-CM | POA: Diagnosis not present

## 2019-12-05 DIAGNOSIS — Z419 Encounter for procedure for purposes other than remedying health state, unspecified: Secondary | ICD-10-CM | POA: Diagnosis not present

## 2019-12-10 ENCOUNTER — Other Ambulatory Visit: Payer: Self-pay | Admitting: Family Medicine

## 2019-12-10 DIAGNOSIS — F331 Major depressive disorder, recurrent, moderate: Secondary | ICD-10-CM

## 2019-12-17 DIAGNOSIS — F331 Major depressive disorder, recurrent, moderate: Secondary | ICD-10-CM | POA: Diagnosis not present

## 2019-12-20 ENCOUNTER — Encounter (HOSPITAL_COMMUNITY): Admission: RE | Admit: 2019-12-20 | Payer: Medicaid Other | Source: Ambulatory Visit

## 2019-12-24 NOTE — Progress Notes (Addendum)
Venus Gilles  12/24/2019      Your procedure is scheduled on  12/30/2019   Report to [facility]  at    0530 A.M.  Call this number if you have problems the morning of [phone]  OUR ADDRESS IS [address], WE ARE LOCATED IN THE [facility] WITH [facility].   Remember:  Do not eat food  after midnight. NO SOLID FOOD AFTER MIDNIGHT THE NIGHT PRIOR TO SURGERY. NOTHING BY MOUTH EXCEPT CLEAR LIQUIDS UNTIL    0430 . PLEASE FINISH ENSURE DRINK PER SURGEON ORDER  WHICH NEEDS TO BE COMPLETED AT .   0430    CLEAR LIQUID DIET   Foods Allowed                                              Coffee and tea, regular and decaf                           Plain Jell-O any favor except  through such as: Fruit ices (not with fruit pulp)                                Iced Popsicles                                Carbonated beverages, regular and diet                                    Cranberry, grape and apple juices Sports drinks like Gatorade Lightly seasoned clear broth or consume(fat free) Sugar, honey syrup                                                                                                                                                 _____________________________________________________________________    Take these medicines the morning of surgery with A SIP OF WATER: Synthroid Lexapro    Do not wear jewelry, make-up or nail polish.  Do not wear lotions, powders, or perfumes, or deoderant.  Do not shave 48 hours prior to surgery.  Men may shave face and neck.  Do not bring valuables to the hospital.  [facility] is not responsible for any belongings or valuables.  Contacts, dentures or bridgework may not be worn into surgery.  Leave your suitcase in the car.  After surgery it may be brought to your room.  For patients admitted to the hospital, discharge time will be determined by your treatment  team.  Patients discharged  the day of surgery will not be allowed to drive home.     Please read over the following fact sheets that you were given:   [facility] - Preparing for Surgery Before surgery, you can play an important role.  Because skin is not sterile, your skin needs to be as free of germs as possible.  You can reduce the number of germs on your skin by washing with CHG (chlorahexidine gluconate) soap before surgery.  CHG is an antiseptic cleaner which kills germs and bonds with the skin to continue killing germs even after washing. Please DO NOT use if you have an allergy to CHG or antibacterial soaps.  If your skin becomes reddened/irritated stop using the CHG and inform your nurse when you arrive at Short Stay. Do not shave (including legs and underarms) for at least 48 hours prior to the first CHG shower.  You may shave your face/neck. Please follow these instructions carefully:  1.  Shower with CHG Soap the night before surgery and the  morning of Surgery.  2.  If you choose to wash your hair, wash your hair first as usual with your  normal  shampoo.  3.  After you shampoo, rinse your hair and body thoroughly to remove the  shampoo.                           4.  Use CHG as you would any other liquid soap.  You can apply chg directly  to the skin and wash                       Gently with a scrungie or clean washcloth.  5.  Apply the CHG Soap to your body ONLY FROM THE NECK DOWN.   Do not use on face/ open                           Wound or open sores. Avoid contact with eyes, ears mouth and genitals (private parts).                       Wash face,  Genitals (private parts) with your normal soap.             6.  Wash thoroughly, paying special attention to the area where your surgery  will be performed.  7.  Thoroughly rinse your body with warm water from the neck down.  8.  DO NOT shower/wash with your normal soap after using and rinsing off  the CHG Soap.                9.  Pat yourself dry with a  clean towel.            10.  Wear clean pajamas.            11.  Place clean sheets on your bed the night of your first shower and do not  sleep with pets. Day of Surgery : Do not apply any lotions/deodorants the morning of surgery.  Please wear clean clothes to the hospital/surgery center.  FAILURE TO FOLLOW THESE INSTRUCTIONS MAY RESULT IN THE CANCELLATION OF YOUR SURGERY PATIENT SIGNATURE_________________________________  NURSE SIGNATURE__________________________________  ________________________________________________________________________

## 2019-12-27 ENCOUNTER — Other Ambulatory Visit: Payer: Self-pay

## 2019-12-27 ENCOUNTER — Encounter (HOSPITAL_COMMUNITY): Payer: Self-pay

## 2019-12-27 ENCOUNTER — Encounter (HOSPITAL_COMMUNITY)
Admission: RE | Admit: 2019-12-27 | Discharge: 2019-12-27 | Disposition: A | Discharge status: Home / Self Care | Payer: Medicaid Other | Source: Ambulatory Visit | Attending: Obstetrics and Gynecology | Admitting: Obstetrics and Gynecology

## 2019-12-27 ENCOUNTER — Other Ambulatory Visit (HOSPITAL_COMMUNITY)
Admission: RE | Admit: 2019-12-27 | Discharge: 2019-12-27 | Disposition: A | Discharge status: Home / Self Care | Payer: Medicaid Other | Source: Ambulatory Visit | Attending: Obstetrics and Gynecology | Admitting: Obstetrics and Gynecology

## 2019-12-27 DIAGNOSIS — Z01812 Encounter for preprocedural laboratory examination: Secondary | ICD-10-CM | POA: Insufficient documentation

## 2019-12-27 DIAGNOSIS — Z20822 Contact with and (suspected) exposure to covid-19: Secondary | ICD-10-CM | POA: Insufficient documentation

## 2019-12-27 HISTORY — DX: Nausea with vomiting, unspecified: R11.2

## 2019-12-27 HISTORY — DX: Other specified postprocedural states: Z98.890

## 2019-12-27 HISTORY — DX: Depression, unspecified: F32.A

## 2019-12-27 LAB — COMPREHENSIVE METABOLIC PANEL
ALT: 15 U/L (ref 0–44)
AST: 18 U/L (ref 15–41)
Albumin: 3.4 g/dL — ABNORMAL LOW (ref 3.5–5.0)
Alkaline Phosphatase: 44 U/L (ref 38–126)
Anion gap: 6 (ref 5–15)
BUN: 23 mg/dL — ABNORMAL HIGH (ref 6–20)
CO2: 24 mmol/L (ref 22–32)
Calcium: 8.3 mg/dL — ABNORMAL LOW (ref 8.9–10.3)
Chloride: 109 mmol/L (ref 98–111)
Creatinine, Ser: 0.57 mg/dL (ref 0.44–1.00)
GFR calc Af Amer: >60 mL/min (ref >60)
GFR calc non Af Amer: >60 mL/min (ref >60)
Glucose, Bld: 94 mg/dL (ref 70–99)
Potassium: 3.7 mmol/L (ref 3.5–5.1)
Sodium: 139 mmol/L (ref 135–145)
Total Bilirubin: 0.4 mg/dL (ref 0.3–1.2)
Total Protein: 6.4 g/dL — ABNORMAL LOW (ref 6.5–8.1)

## 2019-12-27 LAB — CBC
HCT: 40.5 % (ref 36.0–46.0)
Hemoglobin: 13.2 g/dL (ref 12.0–15.0)
MCH: 29.8 pg (ref 26.0–34.0)
MCHC: 32.6 g/dL (ref 30.0–36.0)
MCV: 91.4 fL (ref 80.0–100.0)
Platelets: 260 10*3/uL (ref 150–400)
RBC: 4.43 MIL/uL (ref 3.87–5.11)
RDW: 13.5 % (ref 11.5–15.5)
WBC: 6.3 10*3/uL (ref 4.0–10.5)
nRBC: 0 % (ref 0.0–0.2)

## 2019-12-27 LAB — SARS CORONAVIRUS 2 (TAT 6-24 HRS): SARS Coronavirus 2: NEGATIVE

## 2019-12-28 DIAGNOSIS — Z01818 Encounter for other preprocedural examination: Secondary | ICD-10-CM | POA: Diagnosis not present

## 2019-12-29 DIAGNOSIS — F331 Major depressive disorder, recurrent, moderate: Secondary | ICD-10-CM | POA: Diagnosis not present

## 2019-12-29 NOTE — H&P (Signed)
Carol Welch is an 44 y.o. female [phone] with AUB, pelvic pain and dyspareunia for definitive management.  Pt desires LAVH/BS/cysto - d/w pt r/b/a and process - will proceed.  Also had PCB.  [place] consistent w adenomyosis.  EMB benign.  D/W pt postoperative course and expectations.  Pain and Dyspareunia may not be releived by surgery.    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    + abn pap, s/p LEEP - repeat WNL + GC/Chl/PID  Pt w pelvic, dyspareunia Menstrual History: Patient's last menstrual period was 12/19/2019.    Past Medical History:  Diagnosis Date  . Colitis   . Depression   . Hypothyroid   . PONV (postoperative nausea and vomiting)   . SVD (spontaneous vaginal delivery) 06/30/2015    Past Surgical History:  Procedure Laterality Date  . BREAST CYST ASPIRATION Right 02/13/2018  . ENDOVENOUS ABLATION SAPHENOUS VEIN W/ LASER Right 10/08/2018   endovenous laser ablation right saphenous vein by Waverly Ferrari MD  . NO PAST SURGERIES      Family History  Problem Relation Age of Onset  . Heart disease Father   . Cancer Maternal Aunt   . Cancer Maternal Grandmother   . Heart disease Paternal Grandfather     Social History:  reports that she has never smoked. She has never used smokeless tobacco. She reports that she does not drink alcohol and does not use drugs. married, SAHM  Allergies: No Known Allergies  Meds: Lexapro, OCP, Levothyroxine   Review of Systems  Constitutional: Negative.   HENT: Negative.   Respiratory: Negative.   Cardiovascular: Negative.   Gastrointestinal: Negative.   Genitourinary: Positive for pelvic pain.  Musculoskeletal: Negative.   Skin: Negative.   Neurological: Negative.   Psychiatric/Behavioral: Negative.     Last menstrual period 12/19/2019, unknown if currently breastfeeding. Physical Exam Constitutional:       Appearance: Normal appearance.  Cardiovascular:     Rate and Rhythm: Normal rate and regular rhythm.  Pulmonary:     Effort: Pulmonary effort is normal.     Breath sounds: Normal breath sounds.  Abdominal:     General: Abdomen is flat. Bowel sounds are normal.     Palpations: Abdomen is soft.  Genitourinary:    General: Normal vulva.  Musculoskeletal:        General: Normal range of motion.     Cervical back: Normal range of motion and neck supple.  Neurological:     General: No focal deficit present.     Mental Status: She is alert and oriented to person, place, and time.  Psychiatric:        Mood and Affect: Mood normal.        Behavior: Behavior normal.   [place] adenomyosis, nl ovaries EMB benign   Assessment/Plan: 27PO [id number] for LAVH/BS/cysto Ancef for surgical prophylaxis D/w pt r/b/a of surgery and expectations  Taraji Mungo Bovard-Stuckert 12/29/2019, 8:36 AM

## 2019-12-30 ENCOUNTER — Encounter (HOSPITAL_BASED_OUTPATIENT_CLINIC_OR_DEPARTMENT_OTHER): Payer: Self-pay | Admitting: Obstetrics and Gynecology

## 2019-12-30 ENCOUNTER — Encounter (HOSPITAL_BASED_OUTPATIENT_CLINIC_OR_DEPARTMENT_OTHER)
Admission: RE | Disposition: A | Discharge status: Home / Self Care | Payer: Self-pay | Source: Home / Self Care | Attending: Obstetrics and Gynecology

## 2019-12-30 ENCOUNTER — Observation Stay (HOSPITAL_BASED_OUTPATIENT_CLINIC_OR_DEPARTMENT_OTHER): Payer: Medicaid Other | Admitting: Anesthesiology

## 2019-12-30 ENCOUNTER — Other Ambulatory Visit: Payer: Self-pay

## 2019-12-30 ENCOUNTER — Observation Stay (HOSPITAL_BASED_OUTPATIENT_CLINIC_OR_DEPARTMENT_OTHER)
Admission: RE | Admit: 2019-12-30 | Discharge: 2019-12-31 | Disposition: A | Discharge status: Home / Self Care | Payer: Medicaid Other | Attending: Obstetrics and Gynecology | Admitting: Obstetrics and Gynecology

## 2019-12-30 DIAGNOSIS — N939 Abnormal uterine and vaginal bleeding, unspecified: Secondary | ICD-10-CM | POA: Insufficient documentation

## 2019-12-30 DIAGNOSIS — N800 Endometriosis of uterus: Secondary | ICD-10-CM | POA: Diagnosis not present

## 2019-12-30 DIAGNOSIS — Z90710 Acquired absence of both cervix and uterus: Secondary | ICD-10-CM | POA: Diagnosis present

## 2019-12-30 DIAGNOSIS — N941 Unspecified dyspareunia: Secondary | ICD-10-CM | POA: Diagnosis not present

## 2019-12-30 DIAGNOSIS — D251 Intramural leiomyoma of uterus: Secondary | ICD-10-CM | POA: Diagnosis not present

## 2019-12-30 DIAGNOSIS — I951 Orthostatic hypotension: Secondary | ICD-10-CM | POA: Diagnosis not present

## 2019-12-30 DIAGNOSIS — R102 Pelvic and perineal pain: Secondary | ICD-10-CM | POA: Diagnosis not present

## 2019-12-30 DIAGNOSIS — E039 Hypothyroidism, unspecified: Secondary | ICD-10-CM | POA: Insufficient documentation

## 2019-12-30 DIAGNOSIS — N72 Inflammatory disease of cervix uteri: Secondary | ICD-10-CM | POA: Diagnosis not present

## 2019-12-30 DIAGNOSIS — F329 Major depressive disorder, single episode, unspecified: Secondary | ICD-10-CM | POA: Insufficient documentation

## 2019-12-30 DIAGNOSIS — Z79899 Other long term (current) drug therapy: Secondary | ICD-10-CM | POA: Insufficient documentation

## 2019-12-30 HISTORY — PX: CYSTOSCOPY: SHX5120

## 2019-12-30 HISTORY — PX: LAPAROSCOPIC VAGINAL HYSTERECTOMY WITH SALPINGECTOMY: SHX6680

## 2019-12-30 LAB — TYPE AND SCREEN
ABO/RH(D): A POS
Antibody Screen: NEGATIVE

## 2019-12-30 LAB — POCT PREGNANCY, URINE: Preg Test, Ur: NEGATIVE

## 2019-12-30 SURGERY — HYSTERECTOMY, VAGINAL, LAPAROSCOPY-ASSISTED, WITH SALPINGECTOMY
Anesthesia: General | Site: Urethra

## 2019-12-30 MED ORDER — HYDROMORPHONE HCL 1 MG/ML IJ SOLN
INTRAMUSCULAR | Status: AC
  Filled 2019-12-30: qty 1

## 2019-12-30 MED ORDER — PROPOFOL 10 MG/ML IV BOLUS
INTRAVENOUS | Status: AC
  Filled 2019-12-30: qty 40

## 2019-12-30 MED ORDER — KETOROLAC TROMETHAMINE 30 MG/ML IJ SOLN: 30.0000 mg | Freq: Once | INTRAMUSCULAR | Status: DC

## 2019-12-30 MED ORDER — KETOROLAC TROMETHAMINE 30 MG/ML IJ SOLN
INTRAMUSCULAR | Status: AC
  Filled 2019-12-30: qty 1

## 2019-12-30 MED ORDER — OXYCODONE-ACETAMINOPHEN 5-325 MG PO TABS
1.0000 | ORAL_TABLET | ORAL | Status: DC | PRN
  Administered 2019-12-30 – 2019-12-31 (×5): 2 via ORAL

## 2019-12-30 MED ORDER — ONDANSETRON HCL 4 MG PO TABS: 4.0000 mg | ORAL_TABLET | Freq: Four times a day (QID) | ORAL | Status: DC | PRN

## 2019-12-30 MED ORDER — BUPIVACAINE HCL (PF) 0.25 % IJ SOLN
INTRAMUSCULAR | Status: DC | PRN
  Administered 2019-12-30: 10 mL

## 2019-12-30 MED ORDER — HYDROMORPHONE HCL 1 MG/ML IJ SOLN: 0.2000 mg | INTRAMUSCULAR | Status: DC | PRN

## 2019-12-30 MED ORDER — SIMETHICONE 80 MG PO CHEW
CHEWABLE_TABLET | ORAL | Status: AC
  Filled 2019-12-30: qty 1

## 2019-12-30 MED ORDER — VASOPRESSIN 20 UNIT/ML IV SOLN
INTRAVENOUS | Status: DC | PRN
  Administered 2019-12-30: 20 mL via INTRAMUSCULAR

## 2019-12-30 MED ORDER — GABAPENTIN 300 MG PO CAPS
ORAL_CAPSULE | ORAL | Status: AC
  Filled 2019-12-30: qty 1

## 2019-12-30 MED ORDER — ESCITALOPRAM OXALATE 20 MG PO TABS
20.0000 mg | ORAL_TABLET | Freq: Every day | ORAL | Status: DC
  Filled 2019-12-30: qty 1

## 2019-12-30 MED ORDER — LEVOTHYROXINE SODIUM 100 MCG PO TABS
100.0000 ug | ORAL_TABLET | Freq: Every day | ORAL | Status: DC
  Administered 2019-12-31: 100 ug via ORAL
  Filled 2019-12-30: qty 1

## 2019-12-30 MED ORDER — ONDANSETRON HCL 4 MG/2ML IJ SOLN: 4.0000 mg | Freq: Four times a day (QID) | INTRAMUSCULAR | Status: DC | PRN

## 2019-12-30 MED ORDER — FENTANYL CITRATE (PF) 250 MCG/5ML IJ SOLN
INTRAMUSCULAR | Status: AC
  Filled 2019-12-30: qty 5

## 2019-12-30 MED ORDER — CEFAZOLIN SODIUM-DEXTROSE 2-4 GM/100ML-% IV SOLN
2.0000 g | INTRAVENOUS | Status: AC
  Administered 2019-12-30: 2 g via INTRAVENOUS

## 2019-12-30 MED ORDER — SCOPOLAMINE 1 MG/3DAYS TD PT72
MEDICATED_PATCH | TRANSDERMAL | Status: DC | PRN
  Administered 2019-12-30: 1 via TRANSDERMAL

## 2019-12-30 MED ORDER — DEXAMETHASONE SODIUM PHOSPHATE 10 MG/ML IJ SOLN
INTRAMUSCULAR | Status: DC | PRN
  Administered 2019-12-30: 10 mg via INTRAVENOUS

## 2019-12-30 MED ORDER — ROCURONIUM BROMIDE 10 MG/ML (PF) SYRINGE
PREFILLED_SYRINGE | INTRAVENOUS | Status: DC | PRN
  Administered 2019-12-30: 80 mg via INTRAVENOUS
  Administered 2019-12-30: 10 mg via INTRAVENOUS

## 2019-12-30 MED ORDER — SIMETHICONE 80 MG PO CHEW
80.0000 mg | CHEWABLE_TABLET | Freq: Four times a day (QID) | ORAL | Status: DC | PRN
  Administered 2019-12-30: 80 mg via ORAL

## 2019-12-30 MED ORDER — SCOPOLAMINE 1 MG/3DAYS TD PT72
MEDICATED_PATCH | TRANSDERMAL | Status: AC
  Filled 2019-12-30: qty 1

## 2019-12-30 MED ORDER — ACETAMINOPHEN 500 MG PO TABS
1000.0000 mg | ORAL_TABLET | ORAL | Status: AC
  Administered 2019-12-30: 1000 mg via ORAL

## 2019-12-30 MED ORDER — PROMETHAZINE HCL 25 MG/ML IJ SOLN
INTRAMUSCULAR | Status: AC
  Filled 2019-12-30: qty 1

## 2019-12-30 MED ORDER — LIDOCAINE 2% (20 MG/ML) 5 ML SYRINGE
INTRAMUSCULAR | Status: DC | PRN
  Administered 2019-12-30: 100 mg via INTRAVENOUS

## 2019-12-30 MED ORDER — OXYCODONE-ACETAMINOPHEN 5-325 MG PO TABS
ORAL_TABLET | ORAL | Status: AC
  Filled 2019-12-30: qty 2

## 2019-12-30 MED ORDER — ONDANSETRON HCL 4 MG/2ML IJ SOLN: 4.0000 mg | Freq: Once | INTRAMUSCULAR | Status: DC | PRN

## 2019-12-30 MED ORDER — DEXAMETHASONE SODIUM PHOSPHATE 10 MG/ML IJ SOLN
INTRAMUSCULAR | Status: AC
  Filled 2019-12-30: qty 1

## 2019-12-30 MED ORDER — EPHEDRINE SULFATE-NACL 50-0.9 MG/10ML-% IV SOSY
PREFILLED_SYRINGE | INTRAVENOUS | Status: DC | PRN
  Administered 2019-12-30: 10 mg via INTRAVENOUS

## 2019-12-30 MED ORDER — NALOXONE HCL 0.4 MG/ML IJ SOLN: 0.4000 mg | INTRAMUSCULAR | Status: DC | PRN

## 2019-12-30 MED ORDER — ACETAMINOPHEN 500 MG PO TABS
ORAL_TABLET | ORAL | Status: AC
  Filled 2019-12-30: qty 2

## 2019-12-30 MED ORDER — ONDANSETRON HCL 4 MG/2ML IJ SOLN
INTRAMUSCULAR | Status: DC | PRN
  Administered 2019-12-30: 4 mg via INTRAVENOUS

## 2019-12-30 MED ORDER — LACTATED RINGERS IV SOLN: INTRAVENOUS | Status: DC

## 2019-12-30 MED ORDER — GABAPENTIN 300 MG PO CAPS
300.0000 mg | ORAL_CAPSULE | ORAL | Status: AC
  Administered 2019-12-30: 300 mg via ORAL

## 2019-12-30 MED ORDER — SODIUM CHLORIDE 0.9% FLUSH: 9.0000 mL | INTRAVENOUS | Status: DC | PRN

## 2019-12-30 MED ORDER — ALUM & MAG HYDROXIDE-SIMETH 200-200-20 MG/5ML PO SUSP: 30.0000 mL | ORAL | Status: DC | PRN

## 2019-12-30 MED ORDER — FENTANYL CITRATE (PF) 100 MCG/2ML IJ SOLN
INTRAMUSCULAR | Status: DC | PRN
Start: 2019-12-30 — End: 2019-12-30
  Administered 2019-12-30 (×2): 100 ug via INTRAVENOUS
  Administered 2019-12-30: 50 ug via INTRAVENOUS

## 2019-12-30 MED ORDER — CEFAZOLIN SODIUM-DEXTROSE 2-4 GM/100ML-% IV SOLN
INTRAVENOUS | Status: AC
  Filled 2019-12-30: qty 100

## 2019-12-30 MED ORDER — LIDOCAINE 2% (20 MG/ML) 5 ML SYRINGE
INTRAMUSCULAR | Status: AC
  Filled 2019-12-30: qty 5

## 2019-12-30 MED ORDER — DIPHENHYDRAMINE HCL 50 MG/ML IJ SOLN: 12.5000 mg | Freq: Four times a day (QID) | INTRAMUSCULAR | Status: DC | PRN

## 2019-12-30 MED ORDER — EPHEDRINE 5 MG/ML INJ
INTRAVENOUS | Status: AC
  Filled 2019-12-30: qty 10

## 2019-12-30 MED ORDER — HYDROMORPHONE HCL 1 MG/ML IJ SOLN
0.2500 mg | INTRAMUSCULAR | Status: DC | PRN
  Administered 2019-12-30 (×3): 0.5 mg via INTRAVENOUS

## 2019-12-30 MED ORDER — POVIDONE-IODINE 10 % EX SWAB: 2.0000 "application " | Freq: Once | CUTANEOUS | Status: DC

## 2019-12-30 MED ORDER — MEPERIDINE HCL 25 MG/ML IJ SOLN: 6.2500 mg | INTRAMUSCULAR | Status: DC | PRN

## 2019-12-30 MED ORDER — LACTATED RINGERS IV SOLN
INTRAVENOUS | Status: DC
  Administered 2019-12-30: 1000 mL via INTRAVENOUS

## 2019-12-30 MED ORDER — FLUORESCEIN SODIUM 10 % IV SOLN
INTRAVENOUS | Status: AC
  Filled 2019-12-30: qty 5

## 2019-12-30 MED ORDER — PROPOFOL 10 MG/ML IV BOLUS
INTRAVENOUS | Status: DC | PRN
  Administered 2019-12-30: 150 mg via INTRAVENOUS

## 2019-12-30 MED ORDER — SUGAMMADEX SODIUM 200 MG/2ML IV SOLN
INTRAVENOUS | Status: DC | PRN
  Administered 2019-12-30: 150 mg via INTRAVENOUS

## 2019-12-30 MED ORDER — IBUPROFEN 800 MG PO TABS
800.0000 mg | ORAL_TABLET | Freq: Three times a day (TID) | ORAL | Status: DC | PRN
  Administered 2019-12-30: 800 mg via ORAL

## 2019-12-30 MED ORDER — SODIUM CHLORIDE FLUSH 0.9 % IV SOLN: INTRAVENOUS | Status: DC | PRN

## 2019-12-30 MED ORDER — FLUORESCEIN SODIUM 10 % IV SOLN
INTRAVENOUS | Status: DC | PRN
  Administered 2019-12-30 (×2): .5 mL via INTRAVENOUS

## 2019-12-30 MED ORDER — SODIUM CHLORIDE 0.9 % IR SOLN
Status: DC | PRN
  Administered 2019-12-30: 3000 mL

## 2019-12-30 MED ORDER — KETOROLAC TROMETHAMINE 15 MG/ML IJ SOLN: 15.0000 mg | INTRAMUSCULAR | Status: DC

## 2019-12-30 MED ORDER — MIDAZOLAM HCL 5 MG/5ML IJ SOLN
INTRAMUSCULAR | Status: DC | PRN
  Administered 2019-12-30: 2 mg via INTRAVENOUS

## 2019-12-30 MED ORDER — HYDROMORPHONE HCL 1 MG/ML IJ SOLN
0.2000 mg | INTRAMUSCULAR | Status: DC | PRN
  Administered 2019-12-30: 0.5 mg via INTRAVENOUS

## 2019-12-30 MED ORDER — HYDROMORPHONE 1 MG/ML IV SOLN: INTRAVENOUS | Status: DC

## 2019-12-30 MED ORDER — PROMETHAZINE HCL 25 MG/ML IJ SOLN
INTRAMUSCULAR | Status: DC | PRN
  Administered 2019-12-30: 6.25 mg via INTRAVENOUS

## 2019-12-30 MED ORDER — MENTHOL 3 MG MT LOZG: 1.0000 | LOZENGE | OROMUCOSAL | Status: DC | PRN

## 2019-12-30 MED ORDER — GUAIFENESIN 100 MG/5ML PO SOLN
15.0000 mL | ORAL | Status: DC | PRN
  Filled 2019-12-30: qty 15

## 2019-12-30 MED ORDER — IBUPROFEN 800 MG PO TABS
ORAL_TABLET | ORAL | Status: AC
  Filled 2019-12-30: qty 1

## 2019-12-30 MED ORDER — MIDAZOLAM HCL 2 MG/2ML IJ SOLN
INTRAMUSCULAR | Status: AC
  Filled 2019-12-30: qty 2

## 2019-12-30 MED ORDER — ONDANSETRON HCL 4 MG/2ML IJ SOLN
INTRAMUSCULAR | Status: AC
  Filled 2019-12-30: qty 2

## 2019-12-30 MED ORDER — ROCURONIUM BROMIDE 10 MG/ML (PF) SYRINGE
PREFILLED_SYRINGE | INTRAVENOUS | Status: AC
  Filled 2019-12-30: qty 10

## 2019-12-30 MED ORDER — KETOROLAC TROMETHAMINE 30 MG/ML IJ SOLN
INTRAMUSCULAR | Status: DC | PRN
  Administered 2019-12-30: 30 mg via INTRAVENOUS

## 2019-12-30 MED ORDER — DIPHENHYDRAMINE HCL 12.5 MG/5ML PO ELIX: 12.5000 mg | ORAL_SOLUTION | Freq: Four times a day (QID) | ORAL | Status: DC | PRN

## 2019-12-30 SURGICAL SUPPLY — 52 items
ADH SKN CLS APL DERMABOND .7 (GAUZE/BANDAGES/DRESSINGS) ×2
CNTNR URN SCR LID CUP LEK RST (MISCELLANEOUS) ×2 IMPLANT
CONT SPEC 4OZ STRL OR WHT (MISCELLANEOUS) ×4
COVER BACK TABLE 60X90IN (DRAPES) ×4 IMPLANT
COVER MAYO STAND STRL (DRAPES) ×8 IMPLANT
COVER WAND RF STERILE (DRAPES) ×4 IMPLANT
DECANTER SPIKE VIAL GLASS SM (MISCELLANEOUS) ×8 IMPLANT
DERMABOND ADVANCED (GAUZE/BANDAGES/DRESSINGS) ×2
DERMABOND ADVANCED .7 DNX12 (GAUZE/BANDAGES/DRESSINGS) ×2 IMPLANT
DRSG COVADERM PLUS 2X2 (GAUZE/BANDAGES/DRESSINGS) IMPLANT
DRSG OPSITE POSTOP 3X4 (GAUZE/BANDAGES/DRESSINGS) IMPLANT
DURAPREP 26ML APPLICATOR (WOUND CARE) ×4 IMPLANT
ELECT REM PT RETURN 9FT ADLT (ELECTROSURGICAL) ×4
ELECTRODE REM PT RTRN 9FT ADLT (ELECTROSURGICAL) ×2 IMPLANT
GAUZE 4X4 16PLY RFD (DISPOSABLE) ×4 IMPLANT
GLOVE BIO SURGEON STRL SZ 6.5 (GLOVE) ×9 IMPLANT
GLOVE BIO SURGEON STRL SZ7 (GLOVE) ×4 IMPLANT
GLOVE BIO SURGEONS STRL SZ 6.5 (GLOVE) ×3
GLOVE BIOGEL PI IND STRL 7.0 (GLOVE) ×6 IMPLANT
GLOVE BIOGEL PI IND STRL 7.5 (GLOVE) ×2 IMPLANT
GLOVE BIOGEL PI INDICATOR 7.0 (GLOVE) ×6
GLOVE BIOGEL PI INDICATOR 7.5 (GLOVE) ×2
GLOVE SURG SS PI 7.5 STRL IVOR (GLOVE) ×4 IMPLANT
GOWN STRL REUS W/ TWL LRG LVL3 (GOWN DISPOSABLE) ×12 IMPLANT
GOWN STRL REUS W/TWL LRG LVL3 (GOWN DISPOSABLE) ×24
HIBICLENS CHG 4% 4OZ (MISCELLANEOUS) ×4 IMPLANT
KIT TURNOVER CYSTO (KITS) ×4 IMPLANT
NEEDLE INSUFFLATION 120MM (ENDOMECHANICALS) ×4 IMPLANT
NS IRRIG 1000ML POUR BTL (IV SOLUTION) ×4 IMPLANT
PACK LAVH (CUSTOM PROCEDURE TRAY) ×4 IMPLANT
PACK TRENDGUARD 450 HYBRID PRO (MISCELLANEOUS) ×2 IMPLANT
PAD OB MATERNITY 4.3X12.25 (PERSONAL CARE ITEMS) ×4 IMPLANT
PROTECTOR NERVE ULNAR (MISCELLANEOUS) ×8 IMPLANT
SET IRRIG Y TYPE TUR BLADDER L (SET/KITS/TRAYS/PACK) ×4 IMPLANT
SET SUCTION IRRIG HYDROSURG (IRRIGATION / IRRIGATOR) ×4 IMPLANT
SET TUBE SMOKE EVAC HIGH FLOW (TUBING) ×4 IMPLANT
SHEARS HARMONIC ACE PLUS 36CM (ENDOMECHANICALS) ×4 IMPLANT
SUT VIC AB 0 CT1 27 (SUTURE) ×4
SUT VIC AB 0 CT1 27XBRD ANBCTR (SUTURE) ×2 IMPLANT
SUT VIC AB 1 CT1 18XBRD ANBCTR (SUTURE) ×4 IMPLANT
SUT VIC AB 1 CT1 8-18 (SUTURE) ×8
SUT VIC AB 2-0 CT1 (SUTURE) ×4 IMPLANT
SUT VIC AB 3-0 SH 27 (SUTURE)
SUT VIC AB 3-0 SH 27X BRD (SUTURE) IMPLANT
SUT VIC AB 4-0 PS2 27 (SUTURE) ×4 IMPLANT
SUT VICRYL 0 TIES 12 18 (SUTURE) ×4 IMPLANT
SUT VICRYL 0 UR6 27IN ABS (SUTURE) IMPLANT
TOWEL OR 17X26 10 PK STRL BLUE (TOWEL DISPOSABLE) ×4 IMPLANT
TRAY FOLEY W/BAG SLVR 14FR (SET/KITS/TRAYS/PACK) ×4 IMPLANT
TRENDGUARD 450 HYBRID PRO PACK (MISCELLANEOUS) ×4
TROCAR BLADELESS OPT 5 100 (ENDOMECHANICALS) ×12 IMPLANT
WARMER LAPAROSCOPE (MISCELLANEOUS) ×4 IMPLANT

## 2019-12-30 NOTE — Brief Op Note (Signed)
12/30/2019  9:58 AM  PATIENT:  Carol Welch  44 y.o. female  PRE-OPERATIVE DIAGNOSIS:  pain in pelvis, abnormal uterine bleeding  POST-OPERATIVE DIAGNOSIS:  pain in pelvis, abnormal uterine bleeding  PROCEDURE:  Procedure(s): LAPAROSCOPIC ASSISTED VAGINAL HYSTERECTOMY WITH SALPINGECTOMY (Bilateral) CYSTOSCOPY (N/A)  FINDINGS:  Slightly boggy uterus, with poss fibroid; nl B tubes and ovaries, Question peritoneal window on left  SURGEON:  Surgeon(s) and Role:    * Bovard-Stuckert, Dustine Bertini, MD - Primary    * Banga, Cecilia Worema, DO - Assisting  ANESTHESIA:   general  EBL:  150 mL IVF and uop per anesthesia  BLOOD ADMINISTERED:none  DRAINS: Urinary Catheter (Foley)   LOCAL MEDICATIONS USED:  MARCAINE     SPECIMEN:  Source of Specimen:  uterus, tubes and cervix  DISPOSITION OF SPECIMEN:  PATHOLOGY  COUNTS:  YES  TOURNIQUET:  * No tourniquets in log *  DICTATION: .Other Dictation: Dictation Number [id number]  PLAN OF CARE: Admit for overnight observation  PATIENT DISPOSITION:  PACU - hemodynamically stable.   Delay start of Pharmacological VTE agent (>24hrs) due to surgical blood loss or risk of bleeding: not applicable

## 2019-12-30 NOTE — Anesthesia Postprocedure Evaluation (Signed)
Anesthesia Post Note  Patient: Carol Welch  Procedure(s) Performed: LAPAROSCOPIC ASSISTED VAGINAL HYSTERECTOMY WITH SALPINGECTOMY (Bilateral Abdomen) CYSTOSCOPY (N/A Urethra)     Patient location during evaluation: PACU Anesthesia Type: General Level of consciousness: awake and alert Pain management: pain level controlled Vital Signs Assessment: post-procedure vital signs reviewed and stable Respiratory status: spontaneous breathing, nonlabored ventilation, respiratory function stable and patient connected to nasal cannula oxygen Cardiovascular status: blood pressure returned to baseline and stable Postop Assessment: no apparent nausea or vomiting Anesthetic complications: no   No complications documented.  Last Vitals:  Vitals:   12/30/19 1152 12/30/19 1256  BP: 109/65 113/62  Pulse: 85 85  Resp: 16 18  Temp: 36.4 C 36.4 C  SpO2: 97% 96%    Last Pain:  Vitals:   12/30/19 1245  TempSrc:   PainSc: 8                  Mendi Constable DAVID

## 2019-12-30 NOTE — Anesthesia Procedure Notes (Signed)
Procedure Name: Intubation Date/Time: 12/30/2019 7:37 AM Performed by: Bonney Aid, CRNA Pre-anesthesia Checklist: Patient identified, Emergency Drugs available, Suction available and Patient being monitored Patient Re-evaluated:Patient Re-evaluated prior to induction Oxygen Delivery Method: Circle system utilized Preoxygenation: Pre-oxygenation with 100% oxygen Induction Type: IV induction Ventilation: Mask ventilation without difficulty Laryngoscope Size: Mac and 3 Grade View: Grade I Tube type: Oral Number of attempts: 1 Airway Equipment and Method: Stylet Placement Confirmation: ETT inserted through vocal cords under direct vision,  positive ETCO2 and breath sounds checked- equal and bilateral Secured at: 20 cm Tube secured with: Tape Dental Injury: Teeth and Oropharynx as per pre-operative assessment

## 2019-12-30 NOTE — Progress Notes (Signed)
Day of Surgery Procedure(s) (LRB): LAPAROSCOPIC ASSISTED VAGINAL HYSTERECTOMY WITH SALPINGECTOMY (Bilateral) CYSTOSCOPY (N/A)  Subjective: Patient reports incisional pain, tolerating PO and no problems voiding.  Pt also w shoulder pain.  D/W pt course of surgery and expectations.  Has been ambulating w/o problems.    Objective: I have reviewed patient's vital signs, intake and output, medications and labs.  General: alert and no distress Resp: clear to auscultation bilaterally Cardio: regular rate and rhythm GI: soft, non-tender; bowel sounds normal; no masses,  no organomegaly and incision: clean, dry and intact Extremities: extremities normal, atraumatic, no cyanosis or edema  Assessment: s/p Procedure(s): LAPAROSCOPIC ASSISTED VAGINAL HYSTERECTOMY WITH SALPINGECTOMY (Bilateral) CYSTOSCOPY (N/A): stable and progressing well.  Tolerating diet, ambulating.  Pain controlled.  D/W pt d/c to home in AM and expectations.    Plan: Encourage ambulation Continue current management  D/c home in AM   LOS: 0 days    Carol Welch 12/30/2019, 7:58 PM

## 2019-12-30 NOTE — Op Note (Signed)
[name], [name] MEDICAL RECORD AT:[id number] ACCOUNT [id number] DATE OF BIRTH:15-Mar-1976 FACILITY: [facility] LOCATION: [facility] PHYSICIAN:Vidur Knust BOVARD-STUCKERT, MD  OPERATIVE REPORT  DATE OF PROCEDURE:  12/30/2019  PREOPERATIVE DIAGNOSES:  Dyspareunia, pain in pelvis, abnormal uterine bleeding, adenomyosis on ultrasound.  POSTOPERATIVE DIAGNOSES:  Dyspareunia, pain in pelvis, abnormal uterine bleeding, adenomyosis on ultrasound, status post laparoscopic assisted vaginal hysterectomy.  PROCEDURE:  Laparoscopic assisted vaginal hysterectomy with bilateral salpingectomy and cystoscopy.    FINDINGS:  Slightly boggy uterus with possible fibroid at the fundus.  Normal tubes and ovaries bilaterally.  Question peritoneal window on patient left near the uterosacral ligament.  SURGEON:  Sherron Monday, MD    ASSISTANT:  Pryor Ochoa, DO  ANESTHESIA:  General.  ESTIMATED BLOOD LOSS:  Approximately 150 mL.  URINE OUTPUT AND INTRAVENOUS FLUIDS:  Per anesthesia.  COMPLICATIONS:  None.  PATHOLOGY:  Uterus, cervix and bilateral tubes.  DESCRIPTION OF PROCEDURE:  After informed consent was reviewed with patient including risks, benefits and alternatives of the procedure, she was transported to the operating room and placed on the table in supine position.  General anesthesia was induced  and found to be adequate.  She was then placed in the Y[place]ofin stirrups, prepped and draped in the normal sterile fashion.  Her arms were also tucked.  A Foley catheter was sterilely placed after appropriate an timeout was performed.  A Hulka  manipulator was placed in her uterus to aid with the surgery.  Gloves and gown were changed.  Attention was turned to the abdominal portion of the case.  An approximately 5 mm infraumbilical incision was made and using a Veress needle, pneumoperitoneum  was obtained after hanging drop test was passed with an opening pressure of 5 mmHg.  After 3 liters of gas was in her abdomen  trocar was placed under direct visualization.  Bilateral accessory ports were placed under direct visualization after a brief  pelvic survey was performed.  The tube was grasped on the left at the fimbriated end, using a Harmonic scalpel was excised to the level of the cornu.  The cardinal ligaments were excised to the level of the bladder flap.  Bladder flap was created to the  midline.  Hemostasis was assured.  Attention was then turned to the right side.  The right fimbria were grasped and the tube was excised to the level of the cornu.  The cardinal ligaments were then ligated with Harmonic scalpel to the level of the  uterine artery.  The bladder flap was also created to meet up with the bladder flap from the left.  Instruments were removed and using a heavy weighted speculum and a Sims retractor, the vaginal portion of the case was performed.  Marcaine was injected  into the cervix, approximately 20 mL.  Cervix was circumscribed with Bovie cautery.  The posterior cul-de-sac was entered sharply.  The anterior cul-de-sac was also entered sharply.  The pedicles were created to ligate the uterine artery.  The  uterosacral ligaments were also held.  The uterus was delivered.  The pedicles were noted to be hemostatic.  The uterosacral ligaments were plicated. The mucosa was reapproximated with 2-0 Vicryl in a running locked fashion.  Then a cystoscopy was  performed revealing bilateral jets after fluorescein had been given.  Gloves and gown were changed.  Attention was turned back to the abdomen.  The pedicles were noted to be hemostatic.  Several clots were removed.  The gas was evacuated from the  abdomen.  The ports were  closed with 4-0 Vicryl and Dermabond.  The patient tolerated the procedure well.  Sponge, lap and needle counts were correct x2 per operating staff.  CN/NUANCE  D:12/30/2019 T:12/30/2019 JOB:[id number]

## 2019-12-30 NOTE — Anesthesia Preprocedure Evaluation (Signed)
Anesthesia Evaluation  Patient identified by MRN, date of birth, ID band Patient awake    Reviewed: Allergy & Precautions, NPO status , Patient's Chart, lab work & pertinent test results  History of Anesthesia Complications (+) PONV  Airway Mallampati: I  TM Distance: >3 FB Neck ROM: Full    Dental   Pulmonary    Pulmonary exam normal        Cardiovascular Normal cardiovascular exam     Neuro/Psych Depression    GI/Hepatic   Endo/Other    Renal/GU      Musculoskeletal   Abdominal   Peds  Hematology   Anesthesia Other Findings   Reproductive/Obstetrics                             Anesthesia Physical Anesthesia Plan  ASA: II  Anesthesia Plan: General   Post-op Pain Management:    Induction: Intravenous  PONV Risk Score and Plan: 4 or greater and Ondansetron, Dexamethasone, Midazolam and Scopolamine patch - Pre-op  Airway Management Planned: Oral ETT  Additional Equipment:   Intra-op Plan:   Post-operative Plan: Extubation in OR  Informed Consent: I have reviewed the patients History and Physical, chart, labs and discussed the procedure including the risks, benefits and alternatives for the proposed anesthesia with the patient or authorized representative who has indicated his/her understanding and acceptance.       Plan Discussed with: CRNA and Surgeon  Anesthesia Plan Comments:         Anesthesia Quick Evaluation

## 2019-12-30 NOTE — Interval H&P Note (Signed)
History and Physical Interval Note:  12/30/2019 7:07 AM  Carol Welch  has presented today for surgery, with the diagnosis of pain in pelvis, abnormal uterine bleeding.  The various methods of treatment have been discussed with the patient and family. After consideration of risks, benefits and other options for treatment, the patient has consented to  Procedure(s) with comments: LAPAROSCOPIC ASSISTED VAGINAL HYSTERECTOMY WITH SALPINGECTOMY (Bilateral) POSSIBLE CYSTOSCOPY (N/A) - possible as a surgical intervention.  The patient's history has been reviewed, patient examined, no change in status, stable for surgery.  I have reviewed the patient's chart and labs.  Questions were answered to the patient's satisfaction.     [name]

## 2019-12-30 NOTE — Transfer of Care (Signed)
Immediate Anesthesia Transfer of Care Note  Patient: Carol Welch  Procedure(s) Performed: LAPAROSCOPIC ASSISTED VAGINAL HYSTERECTOMY WITH SALPINGECTOMY (Bilateral Abdomen) CYSTOSCOPY (N/A Urethra)  Patient Location: PACU  Anesthesia Type:General  Level of Consciousness: sedated  Airway & Oxygen Therapy: Patient Spontanous Breathing and Patient connected to nasal cannula oxygen  Post-op Assessment: Report given to RN  Post vital signs: Reviewed and stable  Last Vitals:  Vitals Value Taken Time  BP 107/63 12/30/19 1000  Temp    Pulse 73   Resp 15 12/30/19 1000  SpO2 100   Vitals shown include unvalidated device data.  Last Pain:  Vitals:   12/30/19 0627  TempSrc: Oral  PainSc: 3       Patients Stated Pain Goal: 5 (12/30/19 3762)  Complications: No complications documented.

## 2019-12-30 NOTE — Consult Note (Signed)
Consulted Dr. Annabell Howells

## 2019-12-31 DIAGNOSIS — E039 Hypothyroidism, unspecified: Secondary | ICD-10-CM | POA: Diagnosis not present

## 2019-12-31 DIAGNOSIS — Z79899 Other long term (current) drug therapy: Secondary | ICD-10-CM | POA: Diagnosis not present

## 2019-12-31 DIAGNOSIS — F329 Major depressive disorder, single episode, unspecified: Secondary | ICD-10-CM | POA: Diagnosis not present

## 2019-12-31 DIAGNOSIS — N800 Endometriosis of uterus: Secondary | ICD-10-CM | POA: Diagnosis not present

## 2019-12-31 DIAGNOSIS — N939 Abnormal uterine and vaginal bleeding, unspecified: Secondary | ICD-10-CM | POA: Diagnosis not present

## 2019-12-31 LAB — BASIC METABOLIC PANEL
Anion gap: 9 (ref 5–15)
BUN: 14 mg/dL (ref 6–20)
CO2: 24 mmol/L (ref 22–32)
Calcium: 8 mg/dL — ABNORMAL LOW (ref 8.9–10.3)
Chloride: 104 mmol/L (ref 98–111)
Creatinine, Ser: 0.61 mg/dL (ref 0.44–1.00)
GFR calc Af Amer: >60 mL/min (ref >60)
GFR calc non Af Amer: >60 mL/min (ref >60)
Glucose, Bld: 109 mg/dL — ABNORMAL HIGH (ref 70–99)
Potassium: 4 mmol/L (ref 3.5–5.1)
Sodium: 137 mmol/L (ref 135–145)

## 2019-12-31 LAB — CBC
HCT: 32.2 % — ABNORMAL LOW (ref 36.0–46.0)
Hemoglobin: 10.7 g/dL — ABNORMAL LOW (ref 12.0–15.0)
MCH: 30.6 pg (ref 26.0–34.0)
MCHC: 33.2 g/dL (ref 30.0–36.0)
MCV: 92 fL (ref 80.0–100.0)
Platelets: 207 10*3/uL (ref 150–400)
RBC: 3.5 MIL/uL — ABNORMAL LOW (ref 3.87–5.11)
RDW: 13.5 % (ref 11.5–15.5)
WBC: 8.3 10*3/uL (ref 4.0–10.5)
nRBC: 0 % (ref 0.0–0.2)

## 2019-12-31 LAB — SURGICAL PATHOLOGY

## 2019-12-31 MED ORDER — IBUPROFEN 800 MG PO TABS
ORAL_TABLET | ORAL | Status: AC
  Filled 2019-12-31: qty 1

## 2019-12-31 MED ORDER — HYDROMORPHONE HCL 2 MG PO TABS
2.0000 mg | ORAL_TABLET | Freq: Four times a day (QID) | ORAL | 0 refills | Status: DC | PRN
Start: 2019-12-31 — End: 2020-07-04

## 2019-12-31 MED ORDER — OXYCODONE-ACETAMINOPHEN 5-325 MG PO TABS
ORAL_TABLET | ORAL | Status: AC
  Filled 2019-12-31: qty 2

## 2019-12-31 MED ORDER — HYDROMORPHONE HCL 2 MG PO TABS
2.0000 mg | ORAL_TABLET | Freq: Four times a day (QID) | ORAL | Status: DC | PRN
  Administered 2019-12-31: 2 mg via ORAL

## 2019-12-31 MED ORDER — IBUPROFEN 800 MG PO TABS
800.0000 mg | ORAL_TABLET | Freq: Three times a day (TID) | ORAL | 0 refills | Status: DC | PRN
Start: 2019-12-31 — End: 2020-10-04

## 2019-12-31 MED ORDER — HYDROMORPHONE HCL 2 MG PO TABS
ORAL_TABLET | ORAL | Status: AC
  Filled 2019-12-31: qty 1

## 2019-12-31 NOTE — Discharge Instructions (Signed)
Laparoscopically Assisted Vaginal Hysterectomy, Care After This sheet gives you information about how to care for yourself after your procedure. Your health care provider may also give you more specific instructions. If you have problems or questions, contact your health care provider. What can I expect after the procedure? After the procedure, it is common to have:  Soreness and numbness in your incision areas.  Abdominal pain. You will be given pain medicine to control it.  Vaginal bleeding and discharge. You will need to use a sanitary napkin after this procedure.  Sore throat from the breathing tube that was inserted during surgery. Follow these instructions at home: Medicines  Take over-the-counter and prescription medicines only as told by your health care provider.  Do not take aspirin or ibuprofen. These medicines can cause bleeding.  Do not drive or use heavy machinery while taking prescription pain medicine.  Do not drive for 24 hours if you were given a medicine to help you relax (sedative) during the procedure. Incision care   Follow instructions from your health care provider about how to take care of your incisions. Make sure you: ? Wash your hands with soap and water before you change your bandage (dressing). If soap and water are not available, use hand sanitizer. ? Change your dressing as told by your health care provider. ? Leave stitches (sutures), skin glue, or adhesive strips in place. These skin closures may need to stay in place for 2 weeks or longer. If adhesive strip edges start to loosen and curl up, you may trim the loose edges. Do not remove adhesive strips completely unless your health care provider tells you to do that.  Check your incision area every day for signs of infection. Check for: ? Redness, swelling, or pain. ? Fluid or blood. ? Warmth. ? Pus or a bad smell. Activity  Get regular exercise as told by your health care provider. You may be  told to take short walks every day and go farther each time.  Return to your normal activities as told by your health care provider. Ask your health care provider what activities are safe for you.  Do not douche, use tampons, or have sexual intercourse for at least 6 weeks, or until your health care provider gives you permission.  Do not lift anything that is heavier than 10 lb (4.5 kg), or the limit that your health care provider tells you, until he or she says that it is safe. General instructions  Do not take baths, swim, or use a hot tub until your health care provider approves. Take showers instead of baths.  Do not drive for 24 hours if you received a sedative.  Do not drive or operate heavy machinery while taking prescription pain medicine.  To prevent or treat constipation while you are taking prescription pain medicine, your health care provider may recommend that you: ? Drink enough fluid to keep your urine clear or pale yellow. ? Take over-the-counter or prescription medicines. ? Eat foods that are high in fiber, such as fresh fruits and vegetables, whole grains, and beans. ? Limit foods that are high in fat and processed sugars, such as fried and sweet foods.  Keep all follow-up visits as told by your health care provider. This is important. Contact a health care provider if:  You have signs of infection, such as: ? Redness, swelling, or pain around your incision sites. ? Fluid or blood coming from an incision. ? An incision that feels warm to the   touch. ? Pus or a bad smell coming from an incision.  Your incision breaks open.  Your pain medicine is not helping.  You feel dizzy or light-headed.  You have pain or bleeding when you urinate.  You have persistent nausea and vomiting.  You have blood, pus, or a bad-smelling discharge from your vagina. Get help right away if:  You have a fever.  You have severe abdominal pain.  You have chest pain.  You have  shortness of breath.  You faint.  You have pain, swelling, or redness in your leg.  You have heavy bleeding from your vagina. Summary  After the procedure, it is common to have abdominal pain and vaginal bleeding.  You should not drive or lift heavy objects until your health care provider says that it is safe.  Contact your health care provider if you have any symptoms of infection, excessive vaginal bleeding, nausea, vomiting, or shortness of breath. This information is not intended to replace advice given to you by your health care provider. Make sure you discuss any questions you have with your health care provider. Document Revised: 04/04/2017 Document Reviewed: 06/18/2016 Elsevier Patient Education  2020 [employer]vier [employer]  

## 2019-12-31 NOTE — Discharge Summary (Signed)
Physician Discharge Summary  Patient ID: Carol Welch MRN: [id number] DOB/AGE: Oct 24, 1975 44 y.o.  Admit date: 12/30/2019 Discharge date: 12/31/2019  Admission Diagnoses:  Discharge Diagnoses:  Active Problems:   S/P laparoscopic assisted vaginal hysterectomy (LAVH)   Discharged Condition: good  Hospital Course: admitted, underwent surgery w/o complication.  Ambulating, voiding and pian controlled.  D/C to home postop day #1.  Consults: None  Significant Diagnostic Studies: labs: CBC, BMP  Treatments: IV hydration, analgesia: acetaminophen w/ codeine and Dilaudid and surgery: LAVH,BS, cysto  Discharge Exam: Blood pressure (!) 100/49, pulse 73, temperature 98.2 F (36.8 C), resp. rate 16, height 5\' 2"  (1.575 m), weight 76.1 kg, last menstrual period 12/19/2019, SpO2 97 %, unknown if currently breastfeeding. General appearance: alert and no distress Resp: clear to auscultation bilaterally Cardio: regular rate and rhythm GI: soft, non-tender; bowel sounds normal; no masses,  no organomegaly Extremities: extremities normal, atraumatic, no cyanosis or edema  Inc C/D/I  Disposition: Discharge disposition: 01-Home or Self Care       Discharge Instructions    Call MD for:  persistant nausea and vomiting   Complete by: As directed    Call MD for:  redness, tenderness, or signs of infection (pain, swelling, redness, odor or green/yellow discharge around incision site)   Complete by: As directed    Call MD for:  severe uncontrolled pain   Complete by: As directed    Diet - low sodium heart healthy   Complete by: As directed    Discharge instructions   Complete by: As directed    Call [phone] with questions or problems   Discharge wound care:   Complete by: As directed    Call with redness or discharge   Driving Restrictions   Complete by: As directed    While taking strong pain precautions   Increase activity slowly   Complete by: As directed    Lifting  restrictions   Complete by: As directed    No greater than 10-15lbs for 2-3 weeks   May shower / Bathe   Complete by: As directed    May walk up steps   Complete by: As directed    Sexual Activity Restrictions   Complete by: As directed    Pelvic rest - no douching, tampons or sex for 6 weeks     Allergies as of 12/31/2019   No Known Allergies     Medication List    STOP taking these medications   drospirenone-ethinyl estradiol 3-0.02 MG tablet Commonly known as: YAZ     TAKE these medications   acetaminophen 500 MG tablet Commonly known as: TYLENOL Take 1,000 mg by mouth every 6 (six) hours as needed for mild pain, moderate pain, fever or headache.   escitalopram 20 MG tablet Commonly known as: LEXAPRO Take 1 tablet (20 mg total) by mouth daily.   HYDROmorphone 2 MG tablet Commonly known as: Dilaudid Take 1 tablet (2 mg total) by mouth every 6 (six) hours as needed for severe pain.   ibuprofen 800 MG tablet Commonly known as: ADVIL Take 1 tablet (800 mg total) by mouth every 8 (eight) hours as needed (mild pain). What changed:   medication strength  how much to take  when to take this  reasons to take this   levothyroxine 100 MCG tablet Commonly known as: SYNTHROID Take 100 mcg by mouth daily before breakfast.   multivitamin with minerals tablet Take 1 tablet by mouth daily.   Omega-3 500 MG Caps Take 500  mg by mouth daily.            Discharge Care Instructions  (From admission, onward)         Start     Ordered   12/31/19 0000  Discharge wound care:       Comments: Call with redness or discharge   12/31/19 0701          Follow-up Information    Bovard-Stuckert, Kwali Wrinkle, MD. Schedule an appointment as soon as possible for a visit in 2 week(s).   Specialty: Obstetrics and Gynecology Why: for incision check and 6 weeks for postop check Contact information: [address] [place] [address] [phone]                Signed: Sherian Rein 12/31/2019, 7:02 AM

## 2019-12-31 NOTE — Progress Notes (Addendum)
1 Day Post-Op Procedure(s) (LRB): LAPAROSCOPIC ASSISTED VAGINAL HYSTERECTOMY WITH SALPINGECTOMY (Bilateral) CYSTOSCOPY (N/A)  Subjective: Patient reports incisional pain, tolerating PO and no problems voiding.  Ambulating, tolerating po    Objective: I have reviewed patient's vital signs, intake and output and medications.  General: alert and no distress Resp: clear to auscultation bilaterally Cardio: regular rate and rhythm GI: soft, non-tender; bowel sounds normal; no masses,  no organomegaly and incision: clean, dry and intact Extremities: extremities normal, atraumatic, no cyanosis or edema  Assessment: s/p Procedure(s): LAPAROSCOPIC ASSISTED VAGINAL HYSTERECTOMY WITH SALPINGECTOMY (Bilateral) CYSTOSCOPY (N/A): stable and progressing well  Plan: Discharge home  D/C with motrin 800mg  and po dilaudid..  F/U 2 weeks.    LOS: 0 days    Ornella Coderre Bovard-Stuckert 12/31/2019, 6:48 AM

## 2020-01-02 ENCOUNTER — Other Ambulatory Visit: Payer: Self-pay

## 2020-01-02 ENCOUNTER — Encounter (HOSPITAL_COMMUNITY): Payer: Self-pay | Admitting: *Deleted

## 2020-01-02 ENCOUNTER — Emergency Department (HOSPITAL_COMMUNITY)
Admission: EM | Admit: 2020-01-02 | Discharge: 2020-01-03 | Disposition: A | Discharge status: Home / Self Care | Payer: Medicaid Other | Attending: Emergency Medicine | Admitting: Emergency Medicine

## 2020-01-02 ENCOUNTER — Emergency Department (HOSPITAL_COMMUNITY): Payer: Medicaid Other

## 2020-01-02 DIAGNOSIS — K567 Ileus, unspecified: Secondary | ICD-10-CM | POA: Diagnosis not present

## 2020-01-02 DIAGNOSIS — R143 Flatulence: Secondary | ICD-10-CM | POA: Diagnosis not present

## 2020-01-02 DIAGNOSIS — T819XXA Unspecified complication of procedure, initial encounter: Secondary | ICD-10-CM

## 2020-01-02 DIAGNOSIS — R109 Unspecified abdominal pain: Secondary | ICD-10-CM | POA: Insufficient documentation

## 2020-01-02 DIAGNOSIS — R188 Other ascites: Secondary | ICD-10-CM | POA: Diagnosis not present

## 2020-01-02 DIAGNOSIS — Z5321 Procedure and treatment not carried out due to patient leaving prior to being seen by health care provider: Secondary | ICD-10-CM | POA: Insufficient documentation

## 2020-01-02 LAB — URINALYSIS, ROUTINE W REFLEX MICROSCOPIC
Bilirubin Urine: NEGATIVE
Glucose, UA: NEGATIVE mg/dL
Ketones, ur: NEGATIVE mg/dL
Nitrite: NEGATIVE
Protein, ur: NEGATIVE mg/dL
Specific Gravity, Urine: 1.025 (ref 1.005–1.030)
pH: 6 (ref 5.0–8.0)

## 2020-01-02 LAB — CBC
HCT: 38.6 % (ref 36.0–46.0)
Hemoglobin: 12.5 g/dL (ref 12.0–15.0)
MCH: 29.1 pg (ref 26.0–34.0)
MCHC: 32.4 g/dL (ref 30.0–36.0)
MCV: 90 fL (ref 80.0–100.0)
Platelets: 250 10*3/uL (ref 150–400)
RBC: 4.29 MIL/uL (ref 3.87–5.11)
RDW: 12.8 % (ref 11.5–15.5)
WBC: 8.4 10*3/uL (ref 4.0–10.5)
nRBC: 0 % (ref 0.0–0.2)

## 2020-01-02 LAB — URINALYSIS, MICROSCOPIC (REFLEX)

## 2020-01-02 LAB — COMPREHENSIVE METABOLIC PANEL
ALT: 18 U/L (ref 0–44)
AST: 23 U/L (ref 15–41)
Albumin: 3.2 g/dL — ABNORMAL LOW (ref 3.5–5.0)
Alkaline Phosphatase: 42 U/L (ref 38–126)
Anion gap: 7 (ref 5–15)
BUN: 18 mg/dL (ref 6–20)
CO2: 27 mmol/L (ref 22–32)
Calcium: 9.2 mg/dL (ref 8.9–10.3)
Chloride: 103 mmol/L (ref 98–111)
Creatinine, Ser: 0.77 mg/dL (ref 0.44–1.00)
GFR calc Af Amer: >60 mL/min (ref >60)
GFR calc non Af Amer: >60 mL/min (ref >60)
Glucose, Bld: 101 mg/dL — ABNORMAL HIGH (ref 70–99)
Potassium: 4.4 mmol/L (ref 3.5–5.1)
Sodium: 137 mmol/L (ref 135–145)
Total Bilirubin: 0.3 mg/dL (ref 0.3–1.2)
Total Protein: 6.3 g/dL — ABNORMAL LOW (ref 6.5–8.1)

## 2020-01-02 MED ORDER — KCL-LACTATED RINGERS 20 MEQ/L IV SOLN
INTRAVENOUS | Status: DC
  Filled 2020-01-02: qty 1000

## 2020-01-02 NOTE — ED Triage Notes (Signed)
Pt had a hysterectomy and has had abdominal pain and an inability to pass gas or void.  Last void was 0400.  Dr. Ellyn Hack called ahead and asked for a CBC, CMP and KUB to be done for pt.

## 2020-01-02 NOTE — ED Notes (Signed)
Dr. Ellyn Hack notified that pt is here and her requested orders were put in

## 2020-01-03 ENCOUNTER — Encounter (HOSPITAL_BASED_OUTPATIENT_CLINIC_OR_DEPARTMENT_OTHER): Payer: Self-pay | Admitting: Obstetrics and Gynecology

## 2020-01-03 LAB — URINE CULTURE: Culture: <10000 — AB

## 2020-01-03 NOTE — ED Notes (Addendum)
Pt did not answer for vitals x3 

## 2020-01-05 DIAGNOSIS — R300 Dysuria: Secondary | ICD-10-CM | POA: Diagnosis not present

## 2020-01-05 DIAGNOSIS — Z419 Encounter for procedure for purposes other than remedying health state, unspecified: Secondary | ICD-10-CM | POA: Diagnosis not present

## 2020-01-14 DIAGNOSIS — F331 Major depressive disorder, recurrent, moderate: Secondary | ICD-10-CM | POA: Diagnosis not present

## 2020-01-18 ENCOUNTER — Ambulatory Visit (INDEPENDENT_AMBULATORY_CARE_PROVIDER_SITE_OTHER): Payer: Medicaid Other | Admitting: Family Medicine

## 2020-01-18 ENCOUNTER — Other Ambulatory Visit: Payer: Self-pay

## 2020-01-18 ENCOUNTER — Encounter: Payer: Self-pay | Admitting: Family Medicine

## 2020-01-18 DIAGNOSIS — R079 Chest pain, unspecified: Secondary | ICD-10-CM | POA: Diagnosis not present

## 2020-01-18 DIAGNOSIS — M542 Cervicalgia: Secondary | ICD-10-CM

## 2020-01-18 DIAGNOSIS — F331 Major depressive disorder, recurrent, moderate: Secondary | ICD-10-CM | POA: Diagnosis not present

## 2020-01-18 MED ORDER — ESCITALOPRAM OXALATE 20 MG PO TABS: 30.0000 mg | ORAL_TABLET | Freq: Every day | ORAL | 0 refills | Status: DC

## 2020-01-18 NOTE — Patient Instructions (Signed)
It was great to see you!  Our plans for today:  -Today we discussed your neck pain as well as the sharp pains you have been having in your chest. -For your neck pain I would like for you to start using a warm washcloth on it followed by some stretching as we discussed today.  You can also use ice and ibuprofen as needed and it should continue to improve.  Continued work with mobility will be one of the biggest factors to help it improve.  If it is not better in the next week I would like for you make a follow-up appointment but I do not believe we need any imaging or any other testing at this time -For your sharp chest pain this is most likely due to anxiety, similar to a panic attack.  I would like for you to closely watch for any further bouts of this pain and if you have these I would like for you to make a follow-up appointment.  I think continuing on your Lexapro will be helpful and there is a chance this is related to your neck pain because the muscle that is irritated does attach to your collarbone near the center of your chest. -If you have any other bouts of this chest pain I would like for you to make a follow-up appointment.  If you develop shortness of breath or heavy pressure on your chest I would like for you to immediately go to the emergency department.   Take care and seek immediate care sooner if you develop any concerns.   Dr. [facility]

## 2020-01-18 NOTE — Progress Notes (Signed)
SUBJECTIVE:   CHIEF COMPLAINT / HPI:   Neck pain Patient is a 44 year old female that presents today to discuss neck pain. Had hysterectomy on Aug 26th. Noticed neck pain during the week after that. Timeframe/onset: Constant since last week Severity (1-10):5/10 Location: Posterior left side Duration of pain: All the time Pain radiation: Sometime shooting pain down left arm Character of pain: Sharp pain Pain improves with: Ibuprofen helps, has also used ice packs, mobility work Pain worse with: Stretching  Sharp chest pains: Happened twice in last week. Never happened before. States she has endorsed more stress and symptoms of depression lately. Recently had a hysterectomy a few weeks ago which she states she really did not want to have but needed to for adenomyosis.  She states her chest pains don't occur with activity. Feels like she can't breathe when they happen. Does feel like she is dizzy when these happen and did feel like her face gets flushed. Taking a few deep breaths relieved the pain and she feels better.  She states she does not feel nauseated when this occurs and has not vomited.  She states her most recent one was yesterday when she was getting a glass of water. States she cannot recreate pain with palpation. Denies suicidal/homicidal thoughts.  Denies chest pain or shortness of breath currently.  PERTINENT  PMH / PSH: Recent hysterectomy due to adenomyosis  OBJECTIVE:   BP 116/68   Pulse 70   Ht 5\' 2"  (1.575 m)   Wt 164 lb (74.4 kg)   LMP 12/19/2019   Breastfeeding No   BMI 30.00 kg/m     Office Visit from 01/18/2020 in [facility]  PHQ-9 Total Score 11     General: NAD, pleasant, able to participate in exam Cardiac: RRR, no murmurs.  No reproducible pain on palpation of the chest Respiratory: CTAB, normal effort, No wheezes, rales or rhonchi MSK: Some hypertonicity noted of the left sternocleidomastoid with pain when stretching this by  looking to the left and side bending the head to the right. Psych: Normal affect and mood  ASSESSMENT/PLAN:   Chest pain Assessment: 43 year old female with a recent hysterectomy and history of depression and increased life stressors of recent presenting with 2 incidents of sharp chest pain.  She states these chest pains started last week and are not associated with activity.  These chest pains do not radiate but are associated with some shortness of breath.  They quickly resolve after taking a few deep breaths.  Patient states that she also felt flushed in her forehead when these occurred.  No history of panic attacks, though patient states that she really did not want to have the hysterectomy and has other life stressors at play.  Differential can include anxiety, panic attack versus coronary artery disease versus intermittent arrhythmia.  The nature of patient's chest pain makes coronary artery disease less likely in patients age also supports this as well as her overall good health, though she did recently have surgery.  Intermittent arrhythmia is a possibility and if she has further bouts of this should consider cardiac monitor.  Patient's chest pain could also be related to her sternocleidomastoid strain as this is attached to the clavicle, though her pain seems a little bit lower than this.  Unlikely to be pulmonary embolism or dissection due to the short, isolated bouts of chest pain over the last week, with no current chest pain or shortness of breath. Plan: -Precepted patient with Dr.  Alfredo Bach -As patient is having no current bouts of chest pain and the most likely explanation is anxiety related/panic attack using shared decision-making discussed with patient that while an EKG would be reasonable at this time it would likely be of low yield as she has no symptoms currently and is otherwise a healthy young individual.  Patient states that she did have a significant amount of anxiety and depression  since her hysterectomy as she really did not want to have it but had to due to medical concerns.  Patient plans to continue to monitor for symptoms and if she has any further bouts she will return for further evaluation at which point we will likely do an EKG and a Holter monitor to look for signs of arrhythmia. -Provided extensive return precautions to patient regarding chest pain, shortness of breath.  Neck pain Assessment: 44 year old patient who recently had a hysterectomy who presents with 1 week of left-sided posterior neck pain.  Neck pain occurs at all times and is worse when stretching.  Is of sharp consistency.  Has improved since last week using ibuprofen and ice packs as well as stretching.  On physical exam patient with noted hypertonicity of the sternocleidomastoid on the left with worst pain occurring when stretching this muscle by looking to the left and side bending to the right.  Differential most consistent with a strain of the sternocleidomastoid.  No concerns for imaging at this time with no recent trauma, this most likely occurred due to patient sleeping in a slightly different position after having her recent hysterectomy.  This pain occurred about 1 week after the hysterectomy.  It does seem to be improving with conservative management Plan: -Continue with conservative management including ibuprofen as needed, ice and heat.  Recommended patient continue work on mobility as this will likely play the biggest role in her improvement.  Discussed with patient that her symptoms should be resolved within the next 7 days and if they are not please return and we can get her set up with physical therapy but at this time since her symptoms are improving I do not feel that is needed.     Jackelyn Poling, DO [facility]

## 2020-01-18 NOTE — Assessment & Plan Note (Addendum)
Assessment: 44 year old patient who recently had a hysterectomy who presents with 1 week of left-sided posterior neck pain.  Neck pain occurs at all times and is worse when stretching.  Is of sharp consistency.  Has improved since last week using ibuprofen and ice packs as well as stretching.  On physical exam patient with noted hypertonicity of the sternocleidomastoid on the left with worst pain occurring when stretching this muscle by looking to the left and side bending to the right.  Differential most consistent with a strain of the sternocleidomastoid.  No concerns for imaging at this time with no recent trauma, this most likely occurred due to patient sleeping in a slightly different position after having her recent hysterectomy.  This pain occurred about 1 week after the hysterectomy.  It does seem to be improving with conservative management Plan: -Continue with conservative management including ibuprofen as needed, ice and heat.  Recommended patient continue work on mobility as this will likely play the biggest role in her improvement.  Discussed with patient that her symptoms should be resolved within the next 7 days and if they are not please return and we can get her set up with physical therapy but at this time since her symptoms are improving I do not feel that is needed.

## 2020-01-18 NOTE — Assessment & Plan Note (Addendum)
Assessment: 44 year old female with a recent hysterectomy and history of depression and increased life stressors of recent presenting with 2 incidents of sharp chest pain.  She states these chest pains started last week and are not associated with activity.  These chest pains do not radiate but are associated with some shortness of breath.  They quickly resolve after taking a few deep breaths.  Patient states that she also felt flushed in her forehead when these occurred.  No history of panic attacks, though patient states that she really did not want to have the hysterectomy and has other life stressors at play.  Differential can include anxiety, panic attack versus coronary artery disease versus intermittent arrhythmia.  The nature of patient's chest pain makes coronary artery disease less likely in patients age also supports this as well as her overall good health, though she did recently have surgery.  Intermittent arrhythmia is a possibility and if she has further bouts of this should consider cardiac monitor.  Patient's chest pain could also be related to her sternocleidomastoid strain as this is attached to the clavicle, though her pain seems a little bit lower than this.  Unlikely to be pulmonary embolism or dissection due to the short, isolated bouts of chest pain over the last week, with no current chest pain or shortness of breath. Plan: -Precepted patient with Dr. Alfredo Bach -As patient is having no current bouts of chest pain and the most likely explanation is anxiety related/panic attack using shared decision-making discussed with patient that while an EKG would be reasonable at this time it would likely be of low yield as she has no symptoms currently and is otherwise a healthy young individual.  Patient states that she did have a significant amount of anxiety and depression since her hysterectomy as she really did not want to have it but had to due to medical concerns.  Patient plans to continue to  monitor for symptoms and if she has any further bouts she will return for further evaluation at which point we will likely do an EKG and a Holter monitor to look for signs of arrhythmia. -Provided extensive return precautions to patient regarding chest pain, shortness of breath.

## 2020-01-21 DIAGNOSIS — F331 Major depressive disorder, recurrent, moderate: Secondary | ICD-10-CM | POA: Diagnosis not present

## 2020-01-27 DIAGNOSIS — F331 Major depressive disorder, recurrent, moderate: Secondary | ICD-10-CM | POA: Diagnosis not present

## 2020-02-04 DIAGNOSIS — Z419 Encounter for procedure for purposes other than remedying health state, unspecified: Secondary | ICD-10-CM | POA: Diagnosis not present

## 2020-02-04 DIAGNOSIS — F331 Major depressive disorder, recurrent, moderate: Secondary | ICD-10-CM | POA: Diagnosis not present

## 2020-02-07 ENCOUNTER — Ambulatory Visit (INDEPENDENT_AMBULATORY_CARE_PROVIDER_SITE_OTHER): Payer: Medicaid Other | Admitting: Family Medicine

## 2020-02-07 ENCOUNTER — Other Ambulatory Visit: Payer: Self-pay

## 2020-02-07 ENCOUNTER — Ambulatory Visit (HOSPITAL_COMMUNITY)
Admission: RE | Admit: 2020-02-07 | Discharge: 2020-02-07 | Disposition: A | Discharge status: Home / Self Care | Payer: Medicaid Other | Source: Ambulatory Visit | Attending: Family Medicine | Admitting: Family Medicine

## 2020-02-07 ENCOUNTER — Encounter: Payer: Self-pay | Admitting: Family Medicine

## 2020-02-07 VITALS — BP 102/60 | HR 76 | Ht 62.0 in | Wt 171.1 lb

## 2020-02-07 DIAGNOSIS — F331 Major depressive disorder, recurrent, moderate: Secondary | ICD-10-CM | POA: Diagnosis not present

## 2020-02-07 DIAGNOSIS — K640 First degree hemorrhoids: Secondary | ICD-10-CM | POA: Diagnosis not present

## 2020-02-07 DIAGNOSIS — R0789 Other chest pain: Secondary | ICD-10-CM | POA: Insufficient documentation

## 2020-02-07 DIAGNOSIS — E039 Hypothyroidism, unspecified: Secondary | ICD-10-CM

## 2020-02-07 DIAGNOSIS — R635 Abnormal weight gain: Secondary | ICD-10-CM | POA: Diagnosis not present

## 2020-02-07 DIAGNOSIS — F419 Anxiety disorder, unspecified: Secondary | ICD-10-CM | POA: Diagnosis not present

## 2020-02-07 DIAGNOSIS — K5903 Drug induced constipation: Secondary | ICD-10-CM

## 2020-02-07 DIAGNOSIS — K5900 Constipation, unspecified: Secondary | ICD-10-CM | POA: Insufficient documentation

## 2020-02-07 MED ORDER — SENNOSIDES-DOCUSATE SODIUM 8.6-50 MG PO TABS: 1.0000 | ORAL_TABLET | Freq: Every day | ORAL | 2 refills | Status: DC

## 2020-02-07 MED ORDER — BUSPIRONE HCL 15 MG PO TABS: 15.0000 mg | ORAL_TABLET | Freq: Two times a day (BID) | ORAL | 2 refills | Status: DC

## 2020-02-07 MED ORDER — ESCITALOPRAM OXALATE 20 MG PO TABS: 20.0000 mg | ORAL_TABLET | Freq: Every day | ORAL | 6 refills | Status: DC

## 2020-02-07 NOTE — Assessment & Plan Note (Signed)
Patient reports 2 episodes of small spotting of bright red blood on TP when wiping her BM.  She has been straining more due to constipation after surgery and opiate use.  See constipation problem.  With only small amount of bright red blood per rectum and known hemorrhoids with straining and opioid use expect this to be related to hemorrhoids only.

## 2020-02-07 NOTE — Assessment & Plan Note (Addendum)
Highly unlikely that patient has ischemic chest pain due to age, history, and symptoms more consistent with anxiety.  Obtained EKG today that was normal.  Because the pain also occurs with overwhelming feelings, far more likely to be anxiety as patient also have this diagnosis as well as depression and has significant stressors in her life.  See MDD problem for more information.  If no improvement will try other depression anxiety medications, or can consider further cardiac work-up, however this is not warranted at this point time.  Also considered hyperthyroidism, but patient has been hypothyroid for many years and symptoms were consistent with hypothyroidism.  Last 3 TSH this year consistent with hypothyroidism, now euthyroid treatment.

## 2020-02-07 NOTE — Patient Instructions (Signed)
It was a pleasure to see you today!  1. For eating: I recommend you keep a calorie diary every day. You can look for apps to help with this, I recommend trying MyFitnessPal. Goal is for mindful eating, only eating when you're hungry, and eating fresh fruits, vegetables, and protein in 3 meals to keep from snacking. Also you can use techniques to decrease comfort eating such as prayer (if you have a spiritual practice), calling a friend for accountability, staying busy with walking, playing with your children. Most importantly: don't beat yourself up! Even if it isn't perfect at first, you have done it before and can do it again :)  2. For your anxiety: I recommend taking lexapro 20 mg (1 pill) per day as this is the safe, maximum daily dosage recommended by the FDA. Also start taking buspar twice a day. Continue seeing your therapist, but do not change your medication without talking to me first. Try grounding exercises or the 4-7-8 breathing technique.  3. For constipation, you can increase to 2 capfuls of miralax per day and try 1 senna daily. If this does not work, increase to 2 pills of senna daily. Follow up with me as you need.  Be Well!  Dr. Leary Roca

## 2020-02-07 NOTE — Assessment & Plan Note (Signed)
Patient with symptoms of depression and anxiety still, most recently anxiety symptoms increased. Discussed importance of not changing depression medication without discussing it with an MD/DO first as we are the individuals licensed for medication changes. Continue/increase therapy sessions. - Discussed grounding exercises and 4-7-8 breathing techniques for anxiety symptoms - Lexapro 20 mg refill - Buspar 15 mg BID start - Follow up if symptoms continue

## 2020-02-07 NOTE — Assessment & Plan Note (Signed)
She is only using Dulcolax and MiraLAX 1.5 capfuls per day.  Recommend increasing to 2 capfuls, and adding senna once daily, can increase to 2 if needed.

## 2020-02-07 NOTE — Progress Notes (Signed)
SUBJECTIVE:   CHIEF COMPLAINT / HPI: chest pain, constipation, weight gain  44 yo female patient presents today with complaints of occasional chest pain, constipation and weight gain. She recently had a hysterectomy in August. She has improved from that standpoint, pain decreased, now off opioids for pain, has recently been increasing walking 2x daily, now up to 10-15 minutes at a time and tolerating without pain at surgical site.  Chest pain: patient initially reported this symptom to Dr. Atha Starks on 9/14. She describes the pain as sharp, stabbing, under the left breast accompanied by overwhelming feelings and she cannot catch her breath. She has had 3 episodes in the last 2 weeks that wake her up from sleep, but also occur at other times sporadically such as when she was getting milk out of the fridge. Patient still has significant stresses at home including caring for 5 children (a 2 yo and 32 yo with imperforate anus). She is currently taking 30 mg Lexapro daily (was increased on the recommendation of her therapist) patient is still having significant symptoms of anxiety.  She does not have any personal or family history of heart disease.  Weight gain: Patient has noticed increase in weight since last visit, about 8 pounds.  She is concerned as she worked very hard in 2020 to lose weight.  She notices that she is snacking on saltine crackers more.  She finds herself very hungry and snacking for comfort rather than because of actual hunger.  She does not drink sodas, mostly eats fruits and vegetables.  She wonders if there is a medication I can give her to decrease her appetite.  Constipation: Patient reports occasional constipation that has been happening more frequently since her surgery and opiate use associated with surgery for pain.  She has stopped opiates at this point, but still has some constipation.  She is using 1.5 capfuls of MiraLAX daily with Dulcolax currently.   PERTINENT  PMH /  PSH: hypothyroidism, anxiety/depression, caregiver burnout  OBJECTIVE:   BP 102/60   Pulse 76   Ht 5\' 2"  (1.575 m)   Wt 171 lb 2 oz (77.6 kg)   LMP 12/19/2019   SpO2 99%   BMI 31.30 kg/m   Physical Exam Vitals and nursing note reviewed.  Constitutional:      General: She is not in acute distress.    Appearance: Normal appearance. She is obese. She is not ill-appearing, toxic-appearing or diaphoretic.  HENT:     Head: Normocephalic and atraumatic.  Eyes:     Conjunctiva/sclera: Conjunctivae normal.  Cardiovascular:     Rate and Rhythm: Normal rate and regular rhythm.     Pulses: Normal pulses.     Heart sounds: Normal heart sounds.  Pulmonary:     Effort: Pulmonary effort is normal.     Breath sounds: Normal breath sounds.  Abdominal:     General: Abdomen is flat. Bowel sounds are normal.     Palpations: Abdomen is soft.  Musculoskeletal:     Right lower leg: No edema.     Left lower leg: No edema.  Skin:    General: Skin is warm and dry.  Neurological:     General: No focal deficit present.     Mental Status: She is alert. Mental status is at baseline.  Psychiatric:        Mood and Affect: Mood normal.        Behavior: Behavior normal.    PHQ9 SCORE ONLY 02/07/2020 01/18/2020 11/05/2019  PHQ-9 Total Score 11 11 13    ASSESSMENT/PLAN:   MDD (major depressive disorder) Patient with symptoms of depression and anxiety still, most recently anxiety symptoms increased. Discussed importance of not changing depression medication without discussing it with an MD/DO first as we are the individuals licensed for medication changes. Continue/increase therapy sessions. - Discussed grounding exercises and 4-7-8 breathing techniques for anxiety symptoms - Lexapro 20 mg refill - Buspar 15 mg BID start - Follow up if symptoms continue  Chest pain Highly unlikely that patient has ischemic chest pain due to age, history, and symptoms more consistent with anxiety.  Obtained EKG today  that was normal.  Because the pain also occurs with overwhelming feelings, far more likely to be anxiety as patient also have this diagnosis as well as depression and has significant stressors in her life.  See MDD problem for more information.  If no improvement will try other depression anxiety medications, or can consider further cardiac work-up, however this is not warranted at this point time.  Also considered hyperthyroidism, but patient has been hypothyroid for many years and symptoms were consistent with hypothyroidism.  Last 3 TSH this year consistent with hypothyroidism, now euthyroid treatment.  Hemorrhoid Patient reports 2 episodes of small spotting of bright red blood on TP when wiping her BM.  She has been straining more due to constipation after surgery and opiate use.  See constipation problem.  With only small amount of bright red blood per rectum and known hemorrhoids with straining and opioid use expect this to be related to hemorrhoids only.  Constipation She is only using Dulcolax and MiraLAX 1.5 capfuls per day.  Recommend increasing to 2 capfuls, and adding senna once daily, can increase to 2 if needed.  Weight gain Discussed behavioral change, patient is interested in losing weight.  She has done it once before through exercise and diet.  She noticed that only recently following an abdominal surgery for hysterectomy that she gained about 8 pounds.  She was very distressed by this.  Wanted to know if there is a medication that could help her stop eating.  Patient is presently having symptoms of anxiety, phentermine is not a good choice.  At her BMI of 31 and due to cost GLP-1 inhibitors would not be a good choice at this point.  It is close to goal weight.  Recommend strategies of behavior change including using prayer, activity when having cravings for snacks, replacing calorically dense snack foods such as saltines with nutritionally dense snacks foods such as fresh fruit.  Health  RN recommended patient keep a diary and track her caloric intake and activity.  She is increasing her walking twice a day as tolerated during recovery period from hysterectomy.  Encouraged her to keep going into not beat herself up as she has done this before and can do it again.     , MD [facility]

## 2020-02-07 NOTE — Assessment & Plan Note (Signed)
Discussed behavioral change, patient is interested in losing weight.  She has done it once before through exercise and diet.  She noticed that only recently following an abdominal surgery for hysterectomy that she gained about 8 pounds.  She was very distressed by this.  Wanted to know if there is a medication that could help her stop eating.  Patient is presently having symptoms of anxiety, phentermine is not a good choice.  At her BMI of 31 and due to cost GLP-1 inhibitors would not be a good choice at this point.  It is close to goal weight.  Recommend strategies of behavior change including using prayer, activity when having cravings for snacks, replacing calorically dense snack foods such as saltines with nutritionally dense snacks foods such as fresh fruit.  Health RN recommended patient keep a diary and track her caloric intake and activity.  She is increasing her walking twice a day as tolerated during recovery period from hysterectomy.  Encouraged her to keep going into not beat herself up as she has done this before and can do it again.

## 2020-02-08 DIAGNOSIS — R300 Dysuria: Secondary | ICD-10-CM | POA: Diagnosis not present

## 2020-02-11 DIAGNOSIS — F331 Major depressive disorder, recurrent, moderate: Secondary | ICD-10-CM | POA: Diagnosis not present

## 2020-02-18 DIAGNOSIS — F331 Major depressive disorder, recurrent, moderate: Secondary | ICD-10-CM | POA: Diagnosis not present

## 2020-02-20 ENCOUNTER — Other Ambulatory Visit: Payer: Self-pay | Admitting: Family Medicine

## 2020-02-20 DIAGNOSIS — F331 Major depressive disorder, recurrent, moderate: Secondary | ICD-10-CM

## 2020-02-25 DIAGNOSIS — F331 Major depressive disorder, recurrent, moderate: Secondary | ICD-10-CM | POA: Diagnosis not present

## 2020-03-03 DIAGNOSIS — F331 Major depressive disorder, recurrent, moderate: Secondary | ICD-10-CM | POA: Diagnosis not present

## 2020-03-06 DIAGNOSIS — Z419 Encounter for procedure for purposes other than remedying health state, unspecified: Secondary | ICD-10-CM | POA: Diagnosis not present

## 2020-03-07 DIAGNOSIS — R102 Pelvic and perineal pain: Secondary | ICD-10-CM | POA: Diagnosis not present

## 2020-03-11 DIAGNOSIS — F331 Major depressive disorder, recurrent, moderate: Secondary | ICD-10-CM | POA: Diagnosis not present

## 2020-03-16 DIAGNOSIS — R102 Pelvic and perineal pain: Secondary | ICD-10-CM | POA: Diagnosis not present

## 2020-03-17 ENCOUNTER — Other Ambulatory Visit: Payer: Self-pay

## 2020-03-17 ENCOUNTER — Ambulatory Visit (INDEPENDENT_AMBULATORY_CARE_PROVIDER_SITE_OTHER): Payer: Medicaid Other | Admitting: Student in an Organized Health Care Education/Training Program

## 2020-03-17 VITALS — BP 140/80 | HR 81 | Wt 166.4 lb

## 2020-03-17 DIAGNOSIS — K5909 Other constipation: Secondary | ICD-10-CM

## 2020-03-17 DIAGNOSIS — K645 Perianal venous thrombosis: Secondary | ICD-10-CM | POA: Diagnosis not present

## 2020-03-17 DIAGNOSIS — F331 Major depressive disorder, recurrent, moderate: Secondary | ICD-10-CM | POA: Diagnosis not present

## 2020-03-17 DIAGNOSIS — K5900 Constipation, unspecified: Secondary | ICD-10-CM | POA: Diagnosis not present

## 2020-03-17 MED ORDER — LIDOCAINE-HYDROCORTISONE ACE 1-1 % EX CREA: 1.0000 | TOPICAL_CREAM | Freq: Two times a day (BID) | CUTANEOUS | 0 refills | Status: DC

## 2020-03-17 MED ORDER — "NITROGLYCERIN NICU 2% OINTMENT ": 1.0000 "application " | TOPICAL_OINTMENT | Freq: Two times a day (BID) | TRANSDERMAL | 0 refills | Status: DC

## 2020-03-17 MED ORDER — FIBER 28.3 % PO POWD: 1.0000 | Freq: Two times a day (BID) | ORAL | 0 refills | Status: AC

## 2020-03-17 NOTE — Progress Notes (Deleted)
   SUBJECTIVE:   CHIEF COMPLAINT / HPI:   ***  PERTINENT  PMH / PSH: ***  OBJECTIVE:   BP 140/80   Pulse 81   Wt 166 lb 6.4 oz (75.5 kg)   LMP 12/19/2019   SpO2 98%   BMI 30.43 kg/m   ***  ASSESSMENT/PLAN:   No problem-specific Assessment & Plan notes found for this encounter.     Leeroy Bock, DO [facility]

## 2020-03-17 NOTE — Progress Notes (Signed)
° ° °  SUBJECTIVE:   CHIEF COMPLAINT / HPI: constipation  Constipation- present since having hysterectomy 8/26. Worse recently.  Sensation of a pocket in her rectum when she has a BM. H/o hemorrhoids. No current bleeding with BMs. Taking daily Stool softener, miralax, senna and eating soft foods. Last BM this morning was soft.  2-3 x/day typically now on soft diet.  Hard foods will result in constipation. has painful and itchy bump next to rectum.   OBJECTIVE:   BP 140/80    Pulse 81    Wt 166 lb 6.4 oz (75.5 kg)    LMP 12/19/2019    SpO2 98%    BMI 30.43 kg/m   General: NAD, pleasant, able to participate in exam Abdomen: soft, nontender, nondistended, no hepatic or splenomegaly, +BS Rectal exam: positive for multiple hemorrhoids including one thrombosed. Normal rectal tone. Negative overt blood. No large stool burden. Skin: warm and dry, no rashes noted Neuro: alert and oriented, no focal deficits Psych: Normal affect and mood  ASSESSMENT/PLAN:   External thrombosed hemorrhoids Prescribed topical steroid/lidocaine cream as well as topical nitroglycerin cream for hemorrhoid. Given printed Rx and information for compounding pharmacy. - recommended patient do sitz baths and avoid prolonged periods on the toilet -referral to general surgery if not resolved with conservative treatment  Constipation Having 2-3 soft BMs per day with a soft diet, senna, miralax but having a sensation of pocket preventing full emptying of stool with BMs since having hysterectomy. Possible to have a prolapsed organ. - continue diet and medications. Add on fiber supplement and probiotic - referral to GI for possible colonoscopy     Leeroy Bock, DO [facility]

## 2020-03-17 NOTE — Patient Instructions (Addendum)
It was a pleasure to see you today!  To summarize our discussion for this visit:  You have a thrombosed hemorrhoid.   Try to avoid long periods sitting on the toilet as this can make worse.   Use sitz baths and the creams that I am prescribing.   I have sent in a referral for general surgery for evaluation and possible treatment if not improved with the creams.   For your constipation, please continue to take miralax up to twice per day as well as eating plenty of fiber and senna daily. I will send in a fiber supplement to your pharmacy.   [facility] [address]  [phone]  Closes at [employer] may also want to consider taking a probiotic supplement.   I sent in a GI referral per your request to evaluate for a rectal prolapse.  Some additional health maintenance measures we should update are: Health Maintenance Due  Topic Date Due  . Hepatitis C Screening  Never done  .    Call the clinic at [phone] if your symptoms worsen or you have any concerns.   Thank you for allowing me to take part in your care,  Dr. Jamelle Rushing   How to Take a Sitz Bath A sitz bath is a warm water bath that may be used to care for your rectum, genital area, or the area between your rectum and genitals (perineum). For a sitz bath, the water only comes up to your hips and covers your buttocks. A sitz bath may done at home in a bathtub or with a portable sitz bath that fits over the toilet. Your health care provider may recommend a sitz bath to help:  Relieve pain and discomfort after delivering a baby.  Relieve pain and itching from hemorrhoids or anal fissures.  Relieve pain after certain surgeries.  Relax muscles that are sore or tight. How to take a sitz bath Take 3-4 sitz baths a day, or as many as told by your health care provider. Bathtub sitz bath To take a sitz bath in a bathtub: 1. Partially fill a bathtub with warm  water. The water should be deep enough to cover your hips and buttocks when you are sitting in the tub. 2. If your health care provider told you to put medicine in the water, follow his or her instructions. 3. Sit in the water. 4. Open the tub drain a little, and leave it open during your bath. 5. Turn on the warm water again, enough to replace the water that is draining out. Keep the water running throughout your bath. This helps keep the water at the right level and the right temperature. 6. Soak in the water for 15-20 minutes, or as long as told by your health care provider. 7. When you are done, be careful when you stand up. You may feel dizzy. 8. After the sitz bath, pat yourself dry. Do not rub your skin to dry it.  Over-the-toilet sitz bath To take a sitz bath with an over-the-toilet basin: 1. Follow the manufacturer's instructions. 2. Fill the basin with warm water. 3. If your health care provider told you to put medicine in the water, follow his or her instructions. 4. Sit on the seat. Make sure the water covers your buttocks and perineum. 5. Soak in the water for 15-20 minutes, or as long as told by your health care provider. 6. After the sitz bath, pat  yourself dry. Do not rub your skin to dry it. 7. Clean and dry the basin between uses. 8. Discard the basin if it cracks, or according to the manufacturer's instructions. Contact a health care provider if:  Your symptoms get worse. Do not continue with sitz baths if your symptoms get worse.  You have new symptoms. If this happens, do not continue with sitz baths until you talk with your health care provider. Summary  A sitz bath is a warm water bath in which the water only comes up to your hips and covers your buttocks.  A sitz bath may help relieve itching, relieve pain, and relax muscles that are sore or tight in the lower part of your body, including your genital area.  Take 3-4 sitz baths a day, or as many as told by your  health care provider. Soak in the water for 15-20 minutes.  Do not continue with sitz baths if your symptoms get worse. This information is not intended to replace advice given to you by your health care provider. Make sure you discuss any questions you have with your health care provider. Document Revised: 09/21/2018 Document Reviewed: 04/24/2017 Elsevier Patient Education  2020 [employer].

## 2020-03-20 NOTE — Assessment & Plan Note (Signed)
Having 2-3 soft BMs per day with a soft diet, senna, miralax but having a sensation of pocket preventing full emptying of stool with BMs since having hysterectomy. Possible to have a prolapsed organ. - continue diet and medications. Add on fiber supplement and probiotic - referral to GI for possible colonoscopy

## 2020-03-20 NOTE — Assessment & Plan Note (Signed)
Prescribed topical steroid/lidocaine cream as well as topical nitroglycerin cream for hemorrhoid. Given printed Rx and information for compounding pharmacy. - recommended patient do sitz baths and avoid prolonged periods on the toilet -referral to general surgery if not resolved with conservative treatment

## 2020-03-29 DIAGNOSIS — K602 Anal fissure, unspecified: Secondary | ICD-10-CM | POA: Diagnosis not present

## 2020-03-31 DIAGNOSIS — F331 Major depressive disorder, recurrent, moderate: Secondary | ICD-10-CM | POA: Diagnosis not present

## 2020-04-05 DIAGNOSIS — Z419 Encounter for procedure for purposes other than remedying health state, unspecified: Secondary | ICD-10-CM | POA: Diagnosis not present

## 2020-04-14 DIAGNOSIS — F331 Major depressive disorder, recurrent, moderate: Secondary | ICD-10-CM | POA: Diagnosis not present

## 2020-04-24 DIAGNOSIS — F331 Major depressive disorder, recurrent, moderate: Secondary | ICD-10-CM | POA: Diagnosis not present

## 2020-05-05 DIAGNOSIS — F331 Major depressive disorder, recurrent, moderate: Secondary | ICD-10-CM | POA: Diagnosis not present

## 2020-05-06 DIAGNOSIS — Z419 Encounter for procedure for purposes other than remedying health state, unspecified: Secondary | ICD-10-CM | POA: Diagnosis not present

## 2020-05-08 ENCOUNTER — Ambulatory Visit: Payer: Medicaid Other | Admitting: Internal Medicine

## 2020-05-26 DIAGNOSIS — F331 Major depressive disorder, recurrent, moderate: Secondary | ICD-10-CM | POA: Diagnosis not present

## 2020-06-06 DIAGNOSIS — Z419 Encounter for procedure for purposes other than remedying health state, unspecified: Secondary | ICD-10-CM | POA: Diagnosis not present

## 2020-06-07 DIAGNOSIS — F331 Major depressive disorder, recurrent, moderate: Secondary | ICD-10-CM | POA: Diagnosis not present

## 2020-06-24 DIAGNOSIS — F331 Major depressive disorder, recurrent, moderate: Secondary | ICD-10-CM | POA: Diagnosis not present

## 2020-07-01 DIAGNOSIS — F331 Major depressive disorder, recurrent, moderate: Secondary | ICD-10-CM | POA: Diagnosis not present

## 2020-07-03 NOTE — Progress Notes (Signed)
    SUBJECTIVE:   CHIEF COMPLAINT / HPI:   Anxiety: Patient with history of anxiety, she has been on Lexapro and BuSpar with good results.  Is asking for refills of these medications  Constipation: Ever since patient had her hysterectomy (August 2021) she has had difficulty going to the bathroom and keeping regular bowel movements.  She was previously on docusate-Senokot but says this is no longer helpful.  Patient also reports that she cannot have milk, red meat as these make her more constipated and that her weight has gone up.  Patient has gained 12 pounds since November 2021.  Denies nausea, vomiting, blood in stool.  Hypothyroidism: Patient with history of hypothyroidism for which she takes Synthroid 100 mcg daily.  Patient experiencing constipation and weight gain.  Denies cold intolerance, hair changes, skin changes.  Moles on skin: Patient is reporting 2 moles on her skin which she reports have recently grown in size.  She would like to have them removed.   PERTINENT  PMH / PSH:  Patient Active Problem List   Diagnosis Date Noted  . Encounter for surveillance of abnormal nevi 07/09/2020  . Acquired hypothyroidism 07/07/2020  . 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 Constipation 02/07/2020  . Weight gain 02/07/2020  . Chest pain 01/18/2020  . Neck pain 01/18/2020  . S/P laparoscopic assisted vaginal hysterectomy (LAVH) 12/30/2019  . Pelvic pain 10/22/2019  . Drug reaction 10/22/2019  . Other headache syndrome 10/22/2019  . Vaginal discharge 10/05/2019  . Soft tissue mass 10/05/2019  . External thrombosed hemorrhoids 09/11/2019  . MDD (major depressive disorder) 08/24/2019  . Orthostatic hypotension 08/24/2019  . Abdominal bloating with cramps 08/16/2019  . Abnormal cervical Papanicolaou smear 07/06/2018  . Female pelvic inflammatory disease 07/06/2018  . Hypothyroidism 07/06/2018  . Family history of congenital anomaly 08/27/2016   OBJECTIVE:   BP  120/60   Pulse 62   Ht 5\' 2"  (1.575 m)   Wt 178 lb 3.2 oz (80.8 kg)   LMP 12/19/2019   SpO2 98%   BMI 32.59 kg/m    Physical exam: General: Well-appearing patient, very pleasant Respiratory: Comfortable work of breathing on room air  ASSESSMENT/PLAN:   Subclinical iodine-deficiency hypothyroidism Patient with constipation, weight gain.  Would like to have TSH checked today.  TSH today is low.  Patient currently prescribed Synthroid 100 mcg. -Reduce dose of Synthroid, plan to discuss at next appointment (07/18/2020)  MDD (major depressive disorder) -Escitalopram BuSpar refilled today, PHQ-9 score today 9, improved from prior value of 10.  Constipation Patient with "pocket sensation" as if she cannot empty her bowels.  Is having 1-3 soft stools daily.  Was seen for this issue in November 2021, there was concern patient had prolapse organ, and referred to GI. -Patient encouraged to take senna daily either in the form of tea or prescribed tablet.  Also recommend 1 capful of MiraLAX daily. -Patient to follow-up 07/18/2020, follow-up referral to GI  Encounter for surveillance of abnormal nevi Patient with 2 changing nevi.  Low suspicion for melanoma or other atypia (each less than 6 mm, regular borders; however, have been having color changes). -Patient to follow-up 07/18/2020 for removal of nevi.     07/20/2020, DO [facility]

## 2020-07-04 ENCOUNTER — Other Ambulatory Visit: Payer: Self-pay

## 2020-07-04 ENCOUNTER — Encounter: Payer: Self-pay | Admitting: Family Medicine

## 2020-07-04 ENCOUNTER — Ambulatory Visit (INDEPENDENT_AMBULATORY_CARE_PROVIDER_SITE_OTHER): Payer: Medicaid Other | Admitting: Family Medicine

## 2020-07-04 VITALS — BP 120/60 | HR 62 | Ht 62.0 in | Wt 178.2 lb

## 2020-07-04 DIAGNOSIS — Z419 Encounter for procedure for purposes other than remedying health state, unspecified: Secondary | ICD-10-CM | POA: Diagnosis not present

## 2020-07-04 DIAGNOSIS — F331 Major depressive disorder, recurrent, moderate: Secondary | ICD-10-CM | POA: Diagnosis not present

## 2020-07-04 DIAGNOSIS — E02 Subclinical iodine-deficiency hypothyroidism: Secondary | ICD-10-CM | POA: Insufficient documentation

## 2020-07-04 DIAGNOSIS — F419 Anxiety disorder, unspecified: Secondary | ICD-10-CM

## 2020-07-04 DIAGNOSIS — Z1389 Encounter for screening for other disorder: Secondary | ICD-10-CM

## 2020-07-04 DIAGNOSIS — K5900 Constipation, unspecified: Secondary | ICD-10-CM

## 2020-07-04 MED ORDER — ESCITALOPRAM OXALATE 20 MG PO TABS: ORAL_TABLET | ORAL | 3 refills | Status: DC

## 2020-07-04 MED ORDER — SENNA 8.6 MG PO TABS: 1.0000 | ORAL_TABLET | Freq: Every day | ORAL | 3 refills | Status: DC

## 2020-07-04 MED ORDER — BUSPIRONE HCL 15 MG PO TABS: 15.0000 mg | ORAL_TABLET | Freq: Two times a day (BID) | ORAL | 2 refills | Status: DC

## 2020-07-04 NOTE — Patient Instructions (Addendum)
Thank you for coming in to see Carol Welch today! Please see below to review our plan for today's visit:  1. Try Smooth Move tea once daily - this can make your intestines feel crampy sometimes. 2. I have also prescribed Senna - please check the price of the medicine on the store shelf (you don't need the prescription). 3. I also recommend 1 capful of Miralax daily.   Please call the clinic at [phone] if your symptoms worsen or you have any concerns. It was our pleasure to serve you!   Dr. Peggyann Shoals [facility]

## 2020-07-05 LAB — TSH: TSH: 0.402 u[IU]/mL — ABNORMAL LOW (ref 0.450–4.500)

## 2020-07-06 ENCOUNTER — Encounter: Payer: Self-pay | Admitting: Physician Assistant

## 2020-07-06 NOTE — Progress Notes (Signed)
Name: Stormi Vandevelde  MRN/ DOB: [id number], 11/21/1975    Age/ Sex: 45 y.o., female    PCP: Shirlean Mylar, MD   Reason for Endocrinology Evaluation: Hypothyroidism     Date of Initial Endocrinology Evaluation: 07/07/2020     HPI: Ms. Azile Minardi is a 45 y.o. female with a past medical history of Hypothyroidism. The patient presented for initial endocrinology clinic visit on 07/07/2020 for consultative assistance with her Hypothyroidism     Pt has been diagnosed with hypothyroidism since her teenage years. Has been on LT-4 replacement since then.   In March,2022 was noted with low TSH at 0.402 uIU/mL   Weight has been increasing  She has been constipation  Has depression and anxiety  She takes it appropriately   She is on Biotin   Paternal aunt with thyroid disease   HISTORY:  Past Medical History:  Past Medical History:  Diagnosis Date  . Colitis   . Depression   . Hypothyroid   . PONV (postoperative nausea and vomiting)   . SVD (spontaneous vaginal delivery) 06/30/2015   Past Surgical History:  Past Surgical History:  Procedure Laterality Date  . BREAST CYST ASPIRATION Right 02/13/2018  . CYSTOSCOPY N/A 12/30/2019   Procedure: CYSTOSCOPY;  Surgeon: Sherian Rein, MD;  Location: [facility];  Service: Gynecology;  Laterality: N/A;  . ENDOVENOUS ABLATION SAPHENOUS VEIN W/ LASER Right 10/08/2018   endovenous laser ablation right saphenous vein by Waverly Ferrari MD  . LAPAROSCOPIC VAGINAL HYSTERECTOMY WITH SALPINGECTOMY Bilateral 12/30/2019   Procedure: LAPAROSCOPIC ASSISTED VAGINAL HYSTERECTOMY WITH SALPINGECTOMY;  Surgeon: Sherian Rein, MD;  Location: [facility];  Service: Gynecology;  Laterality: Bilateral;  . NO PAST SURGERIES        Social History:  reports that she has never smoked. She has never used smokeless tobacco. She reports that she does not drink alcohol and does not use drugs.  Family History:  family history includes Cancer in her maternal aunt and maternal grandmother; Heart disease in her father and paternal grandfather.   HOME MEDICATIONS: Allergies as of 07/07/2020   No Known Allergies     Medication List       Accurate as of July 07, 2020 11:11 AM. If you have any questions, ask your nurse or doctor.        acetaminophen 500 MG tablet Commonly known as: TYLENOL Take 1,000 mg by mouth every 6 (six) hours as needed for mild pain, moderate pain, fever or headache.   B-12 PO Take by mouth.   busPIRone 15 MG tablet Commonly known as: BUSPAR Take 1 tablet (15 mg total) by mouth 2 (two) times daily.   escitalopram 20 MG tablet Commonly known as: LEXAPRO TAKE 1 AND 1/2 TABLETS(30 MG) BY MOUTH DAILY   Fiber 28.3 % Powd Take 1 Package by mouth 2 (two) times daily.   ibuprofen 800 MG tablet Commonly known as: ADVIL Take 1 tablet (800 mg total) by mouth every 8 (eight) hours as needed (mild pain).   levothyroxine 100 MCG tablet Commonly known as: SYNTHROID Take 100 mcg by mouth daily before breakfast.   Lidocaine-Hydrocortisone Ace 1-1 % Crea Apply 1 application topically 2 (two) times daily.   multivitamin with minerals tablet Take 1 tablet by mouth daily.   nitroGLYCERIN 2 % Oint ointment Commonly known as: NITROGLYN Apply 1 application topically every 12 (twelve) hours.   Omega-3 500 MG Caps Take 500 mg by mouth daily.   senna 8.6 MG Tabs  tablet Commonly known as: SENOKOT Take 1 tablet (8.6 mg total) by mouth daily.         REVIEW OF SYSTEMS: A comprehensive ROS was conducted with the patient and is negative except as per HPI and below:  ROS     OBJECTIVE:  VS: BP 134/72   Pulse 65   Ht 5\' 2"  (1.575 m)   Wt 178 lb 4 oz (80.9 kg)   LMP 12/19/2019   SpO2 98%   BMI 32.60 kg/m    Wt Readings from Last 3 Encounters:  07/07/20 178 lb 4 oz (80.9 kg)  07/04/20 178 lb 3.2 oz (80.8 kg)  03/17/20 166 lb 6.4 oz (75.5 kg)      EXAM: General: Pt appears well and is in NAD  Eyes: External eye exam normal without stare, lid lag or exophthalmos.  EOM intact.   Neck: General: Supple without adenopathy. Thyroid: Thyroid size normal.  No goiter or nodules appreciated.  Lungs: Clear with good BS bilat with no rales, rhonchi, or wheezes  Heart: Auscultation: RRR.  Abdomen: Normoactive bowel sounds, soft, nontender, without masses or organomegaly palpable  Extremities:  BL LE: No pretibial edema normal ROM and strength.  Skin: Hair: Texture and amount normal with gender appropriate distribution Skin Inspection: No rashes Skin Palpation: Skin temperature, texture, and thickness normal to palpation  Neuro: Cranial nerves: II - XII grossly intact  Cerebellar: Normal coordination and movement; no tremor Motor: Normal strength throughout DTRs: 2+ and symmetric in UE without delay in relaxation phase  Mental Status: Judgment, insight: Intact Orientation: Oriented to time, place, and person Memory: Intact for recent and remote events Mood and affect: No depression, anxiety, or agitation     DATA REVIEWED:   Results for JILENE, SPOHR (MRN Tyson Alias) as of 07/07/2020 14:03  Ref. Range 07/04/2020 11:43  TSH Latest Ref Range: 0.450 - 4.500 uIU/mL 0.402 (L)    ASSESSMENT/PLAN/RECOMMENDATIONS:   1. Hypothyroidism :  - Pt with weight gain and fatigue, her last TSH was suppressed at 0.402 uIU/Ml  - Pt is on collagen supplements, I have asked her to check on Biotin content, if there's a biotin, she will have to hold it 2-3 days prior to her next thyroid check in 6 weeks   Medications : Continue levothyroxine 100 mcg daily    Labs in 6 weeks   F/U in 4 months     Signed electronically by: 09/03/2020, MD  [facility]  [facility] [address] [place] [place], [place] Phone: [phone] FAX: [phone]   CC: [phone], MD [address]  [place] Phone: [phone] Fax: [phone]   Return to Endocrinology clinic as below: Future Appointments  Date Time Provider Department Center  07/18/2020 10:30 AM 07/20/2020, DO [facility]  07/19/2020  9:30 AM E[place], Amy S, PA-C [facility]

## 2020-07-07 ENCOUNTER — Other Ambulatory Visit: Payer: Self-pay

## 2020-07-07 ENCOUNTER — Ambulatory Visit (INDEPENDENT_AMBULATORY_CARE_PROVIDER_SITE_OTHER): Payer: Medicaid Other | Admitting: Internal Medicine

## 2020-07-07 ENCOUNTER — Encounter: Payer: Self-pay | Admitting: Internal Medicine

## 2020-07-07 VITALS — BP 134/72 | HR 65 | Ht 62.0 in | Wt 178.2 lb

## 2020-07-07 DIAGNOSIS — E039 Hypothyroidism, unspecified: Secondary | ICD-10-CM | POA: Insufficient documentation

## 2020-07-07 NOTE — Patient Instructions (Signed)
You are on levothyroxine - which is your thyroid hormone supplement. You MUST take this consistently.  You should take this first thing in the morning on an empty stomach with water. You should not take it with other medications. Wait to 1hr prior to eating. If you are taking any vitamins - please take these in the evening.   If you miss a dose, please take your missed dose the following day (double the dose for that day). You should have a pill box for ONLY levothyroxine on your bedside table to help you remember to take your medications.    Please HOLD Biotin for 2-3 days before your thyroid blood test

## 2020-07-08 DIAGNOSIS — F331 Major depressive disorder, recurrent, moderate: Secondary | ICD-10-CM | POA: Diagnosis not present

## 2020-07-09 DIAGNOSIS — Z1389 Encounter for screening for other disorder: Secondary | ICD-10-CM | POA: Insufficient documentation

## 2020-07-09 NOTE — Assessment & Plan Note (Signed)
Patient with constipation, weight gain.  Would like to have TSH checked today.  TSH today is low.  Patient currently prescribed Synthroid 100 mcg. -Reduce dose of Synthroid, plan to discuss at next appointment (07/18/2020)

## 2020-07-09 NOTE — Assessment & Plan Note (Addendum)
-  Escitalopram BuSpar refilled today, PHQ-9 score today 9, improved from prior value of 10.

## 2020-07-09 NOTE — Assessment & Plan Note (Signed)
Patient with 2 changing nevi.  Low suspicion for melanoma or other atypia (each less than 6 mm, regular borders; however, have been having color changes). -Patient to follow-up 07/18/2020 for removal of nevi.

## 2020-07-09 NOTE — Assessment & Plan Note (Signed)
Patient with "pocket sensation" as if she cannot empty her bowels.  Is having 1-3 soft stools daily.  Was seen for this issue in November 2021, there was concern patient had prolapse organ, and referred to GI. -Patient encouraged to take senna daily either in the form of tea or prescribed tablet.  Also recommend 1 capful of MiraLAX daily. -Patient to follow-up 07/18/2020, follow-up referral to GI

## 2020-07-14 DIAGNOSIS — F331 Major depressive disorder, recurrent, moderate: Secondary | ICD-10-CM | POA: Diagnosis not present

## 2020-07-17 NOTE — Progress Notes (Signed)
SUBJECTIVE:   CHIEF COMPLAINT / HPI:   Evolving Nevi: Patient presents to clinic today with concerns for 2 nevi, one on her right lower abdomen and one on her right medial thigh, that have been slowly changing in size and coloration.  She reports that they are occasionally itchy.  Denies any bleeding, ulceration from the lesions.  Has never had either of them biopsied before.  No family or personal history of skin cancer.  Right hand pain/index finger pain: Patient reports that about 1 week ago a door was slamming so she stuck her hand out to try to prevent it from slamming and hurt the MCP joint of her second digit/index finger.  She has associated pain and swelling at the MCP.  She has been putting ice on the finger to help decrease the swelling; however, the pain persists.  Making a fist/flexing her index finger is painful.  PERTINENT  PMH / PSH:  Patient Active Problem List   Diagnosis Date Noted  . Hand trauma, right, initial encounter 07/18/2020  . Encounter for surveillance of abnormal nevi 07/09/2020  . Acquired hypothyroidism 07/07/2020  . 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 Neck pain 01/18/2020  . S/P laparoscopic assisted vaginal hysterectomy (LAVH) 12/30/2019  . External thrombosed hemorrhoids 09/11/2019  . MDD (major depressive disorder) 08/24/2019  . Orthostatic hypotension 08/24/2019  . Hypothyroidism 07/06/2018  . Family history of congenital anomaly 08/27/2016     OBJECTIVE:   BP 122/60   Pulse 61   Ht 5\' 2"  (1.575 m)   Wt 177 lb 6.4 oz (80.5 kg)   LMP 12/19/2019   SpO2 98%   BMI 32.45 kg/m    Physical exam: General: Well-appearing patient, no apparent distress Respiratory: Comfortable work of breathing on room air Right hand/index finger Inspection: Bony deformity appreciated over MCP joint of second finger.  Mild swelling and bruising; no erythema Palpation: TTP to MCP joint second finger ROM: Decreased ROM of index finger,  particularly reduced flexion (only about 30 degrees) Strength: 5/5 strength in the forearm, wrist and interosseus muscles Neurovascular: NV intact Integumentary:    Right medial thigh    right lower abdomen  PRE-OP DIAGNOSIS: Desired shave biopsy of evolving nevus POST-OP DIAGNOSIS: Same   PROCEDURE: Shave biopsy x2  Performing Physician: 12/21/2019   PROCEDURE:  Written informed consent obtained Site: Right medial thigh; right lower abdomen Sterile Preparation:    [x]       Betadine        [_]     Chloraprep             After a time-out, nevi of concern were identified.  Procedure areas were prepped and draped in a sterile fashion.  2.5 mL of 1% lidocaine with epinephrine was used for subcutaneous anesthesia to each site (total of 5 mL). Anesthesia confirmed.  Derma blade was used to shave/collect specimen from right medial thigh.  Second sterile derma blade was used to shave/collect specimen from right lower quadrant.  Specimen were correctly labeled and verified by the patient.  Specimen were sent for Derm pathology.  Estimated blood loss: minimal Dressings applied: Bacitracin, Band-Aid Followup: The patient tolerated the procedure well without complications.  Standard post-procedure care was explained and return precautions were given.     ASSESSMENT/PLAN:   Encounter for surveillance of abnormal nevi Shave biopsy today performed to both right lower abdominal nevus and right medial thigh nevus. -Derm pathology ordered await biopsy -Plan to review results and treat  as needed -Patient instructed to apply bacitracin to her bilateral shave biopsy sites and keep covered with a Band-Aid, can bathe with soap/water as usual  Hand trauma, right, initial encounter Small nondescript bony deformity with pain/swelling appreciated to patient's MCP joint of right index finger.  Concern for fracture/tendon rupture. -X-ray of right hand and first finger ordered -Follow-up imaging,  treat/refer as necessary     Dollene Cleveland, DO [facility]

## 2020-07-18 ENCOUNTER — Encounter: Payer: Self-pay | Admitting: Family Medicine

## 2020-07-18 ENCOUNTER — Ambulatory Visit (INDEPENDENT_AMBULATORY_CARE_PROVIDER_SITE_OTHER): Payer: Medicaid Other | Admitting: Family Medicine

## 2020-07-18 ENCOUNTER — Ambulatory Visit
Admission: RE | Admit: 2020-07-18 | Discharge: 2020-07-18 | Disposition: A | Discharge status: Home / Self Care | Payer: Medicaid Other | Source: Ambulatory Visit | Attending: Family Medicine | Admitting: Family Medicine

## 2020-07-18 ENCOUNTER — Other Ambulatory Visit: Payer: Self-pay

## 2020-07-18 VITALS — BP 122/60 | HR 61 | Ht 62.0 in | Wt 177.4 lb

## 2020-07-18 DIAGNOSIS — D235 Other benign neoplasm of skin of trunk: Secondary | ICD-10-CM | POA: Diagnosis not present

## 2020-07-18 DIAGNOSIS — Z1389 Encounter for screening for other disorder: Secondary | ICD-10-CM | POA: Diagnosis not present

## 2020-07-18 DIAGNOSIS — D2371 Other benign neoplasm of skin of right lower limb, including hip: Secondary | ICD-10-CM | POA: Diagnosis not present

## 2020-07-18 DIAGNOSIS — S6991XA Unspecified injury of right wrist, hand and finger(s), initial encounter: Secondary | ICD-10-CM | POA: Insufficient documentation

## 2020-07-18 NOTE — Assessment & Plan Note (Addendum)
Small nondescript bony deformity with pain/swelling appreciated to patient's MCP joint of right index finger.  Concern for fracture/tendon rupture. -X-ray of right hand and first finger ordered -Follow-up imaging, treat/refer as necessary -Patient can continue Tylenol/ibuprofen, ice to hand -Referral to hand surgeon made

## 2020-07-18 NOTE — Patient Instructions (Signed)
Thank you for coming in to see [place] today! Please see below to review our plan for today's visit:  1.  I will call you with results of your biopsy and Xray. We will discuss next steps after we get the imaging back.   Please call the clinic at [phone] if your symptoms worsen or you have any concerns. It was our pleasure to serve you!   Dr. Peggyann Shoals [facility]

## 2020-07-18 NOTE — Assessment & Plan Note (Signed)
Shave biopsy today performed to both right lower abdominal nevus and right medial thigh nevus. -Derm pathology ordered await biopsy -Plan to review results and treat as needed -Patient instructed to apply bacitracin to her bilateral shave biopsy sites and keep covered with a Band-Aid, can bathe with soap/water as usual

## 2020-07-19 ENCOUNTER — Encounter: Payer: Self-pay | Admitting: Physician Assistant

## 2020-07-19 ENCOUNTER — Ambulatory Visit (INDEPENDENT_AMBULATORY_CARE_PROVIDER_SITE_OTHER): Payer: Medicaid Other

## 2020-07-19 ENCOUNTER — Ambulatory Visit (INDEPENDENT_AMBULATORY_CARE_PROVIDER_SITE_OTHER): Payer: Medicaid Other | Admitting: Orthopaedic Surgery

## 2020-07-19 ENCOUNTER — Ambulatory Visit: Payer: Medicaid Other | Admitting: Physician Assistant

## 2020-07-19 ENCOUNTER — Other Ambulatory Visit (INDEPENDENT_AMBULATORY_CARE_PROVIDER_SITE_OTHER): Payer: Medicaid Other

## 2020-07-19 VITALS — BP 100/80 | HR 76 | Ht 62.5 in | Wt 176.1 lb

## 2020-07-19 DIAGNOSIS — M25511 Pain in right shoulder: Secondary | ICD-10-CM

## 2020-07-19 DIAGNOSIS — R140 Abdominal distension (gaseous): Secondary | ICD-10-CM | POA: Diagnosis not present

## 2020-07-19 DIAGNOSIS — G8929 Other chronic pain: Secondary | ICD-10-CM | POA: Diagnosis not present

## 2020-07-19 DIAGNOSIS — R109 Unspecified abdominal pain: Secondary | ICD-10-CM

## 2020-07-19 DIAGNOSIS — K5900 Constipation, unspecified: Secondary | ICD-10-CM

## 2020-07-19 LAB — CBC WITH DIFFERENTIAL/PLATELET
Basophils Absolute: 0 10*3/uL (ref 0.0–0.1)
Basophils Relative: 0.9 % (ref 0.0–3.0)
Eosinophils Absolute: 0.1 10*3/uL (ref 0.0–0.7)
Eosinophils Relative: 1.3 % (ref 0.0–5.0)
HCT: 40.2 % (ref 36.0–46.0)
Hemoglobin: 13.7 g/dL (ref 12.0–15.0)
Lymphocytes Relative: 48.1 % — ABNORMAL HIGH (ref 12.0–46.0)
Lymphs Abs: 2.3 10*3/uL (ref 0.7–4.0)
MCHC: 34 g/dL (ref 30.0–36.0)
MCV: 88.6 fl (ref 78.0–100.0)
Monocytes Absolute: 0.3 10*3/uL (ref 0.1–1.0)
Monocytes Relative: 5.8 % (ref 3.0–12.0)
Neutro Abs: 2.1 10*3/uL (ref 1.4–7.7)
Neutrophils Relative %: 43.9 % (ref 43.0–77.0)
Platelets: 216 10*3/uL (ref 150.0–400.0)
RBC: 4.54 Mil/uL (ref 3.87–5.11)
RDW: 13 % (ref 11.5–15.5)
WBC: 4.9 10*3/uL (ref 4.0–10.5)

## 2020-07-19 LAB — BASIC METABOLIC PANEL
BUN: 19 mg/dL (ref 6–23)
CO2: 27 mEq/L (ref 19–32)
Calcium: 8.8 mg/dL (ref 8.4–10.5)
Chloride: 105 mEq/L (ref 96–112)
Creatinine, Ser: 0.63 mg/dL (ref 0.40–1.20)
GFR: 108.66 mL/min (ref >60.00)
Glucose, Bld: 89 mg/dL (ref 70–99)
Potassium: 4 mEq/L (ref 3.5–5.1)
Sodium: 138 mEq/L (ref 135–145)

## 2020-07-19 LAB — SEDIMENTATION RATE: Sed Rate: 5 mm/hr (ref 0–20)

## 2020-07-19 MED ORDER — METHOCARBAMOL 500 MG PO TABS: 500.0000 mg | ORAL_TABLET | Freq: Two times a day (BID) | ORAL | 0 refills | Status: DC | PRN

## 2020-07-19 MED ORDER — PREDNISONE 10 MG (21) PO TBPK: ORAL_TABLET | ORAL | 0 refills | Status: DC

## 2020-07-19 NOTE — Progress Notes (Signed)
Subjective:    Patient ID: Carol Welch, female    DOB: [birth year], 45 y.o.   MRN: [id number]  HPI Carol Welch is a pleasant 45 year old female, new to GI today referred by Shirlean Mylar, MD for evaluation of constipation and lower abdominal pain. Patient has history of hypothyroidism, depression and is status post laparoscopic hysterectomy and BSO in August 2021. She says her current symptoms started after she had her surgery.  She had had some mild issues prior to surgery with constipation but it has been significantly worse since.  She also is complaining of a lot of intermittent abdominal bloating sometimes distention and discomfort after eating and has had to alter her diet quite a bit.  She says she is staying on very soft foods and is avoiding meat bread and flour in general over the past few months because heavier foods tend to cause more distention and abdominal pain.  She is distressed as her weight is up despite trying to eat very healthy. She is now on a regimen of Senokot 1 at bedtime and Metamucil twice daily in addition to most days drinking some smooth move tea.  With this combination she is having good bowel movements without straining.  She says sometimes she feels that it helps her to have a bowel movement if she presses on her lower abdomen.  She has had some issues with hemorrhoids in the past but only with straining which has not been an issue recently.  She has not noticed any melena or hematochezia. Family history is negative for GI disease as far she is aware. She mentions that she has follow-up with her gynecologist next week.  Review of Systems Pertinent positive and negative review of systems were noted in the above HPI section.  All other review of systems was otherwise negative.  Outpatient Encounter Medications as of 07/19/2020  Medication Sig  . acetaminophen (TYLENOL) 500 MG tablet Take 1,000 mg by mouth every 6 (six) hours as needed for mild pain, moderate pain,  fever or headache.   . busPIRone (BUSPAR) 15 MG tablet Take 1 tablet (15 mg total) by mouth 2 (two) times daily.  . Cyanocobalamin (B-12 PO) Take by mouth.  . escitalopram (LEXAPRO) 20 MG tablet TAKE 1 AND 1/2 TABLETS(30 MG) BY MOUTH DAILY  . ibuprofen (ADVIL) 800 MG tablet Take 1 tablet (800 mg total) by mouth every 8 (eight) hours as needed (mild pain).  Carol Welch Oil (OMEGA-3) 500 MG CAPS Take 500 mg by mouth daily.  Marland Kitchen levothyroxine (SYNTHROID, LEVOTHROID) 100 MCG tablet Take 100 mcg by mouth daily before breakfast.   . Lidocaine-Hydrocortisone Ace 1-1 % CREA Apply 1 application topically 2 (two) times daily.  . Multiple Vitamins-Minerals (MULTIVITAMIN WITH MINERALS) tablet Take 1 tablet by mouth daily.  . nitroGLYCERIN (NITROGLYN) 2 % OINT ointment Apply 1 application topically every 12 (twelve) hours.  . Psyllium (FIBER) 28.3 % POWD Take 1 Package by mouth 2 (two) times daily.  Marland Kitchen senna (SENOKOT) 8.6 MG TABS tablet Take 1 tablet (8.6 mg total) by mouth daily.   No facility-administered encounter medications on file as of 07/19/2020.   No Known Allergies Patient Active Problem List   Diagnosis Date Noted  . Hand trauma, right, initial encounter 07/18/2020  . Encounter for surveillance of abnormal nevi 07/09/2020  . Acquired hypothyroidism 07/07/2020  . 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 Neck pain 01/18/2020  . S/P laparoscopic assisted vaginal hysterectomy (LAVH) 12/30/2019  . External thrombosed hemorrhoids 09/11/2019  . MDD (  major depressive disorder) 08/24/2019  . Orthostatic hypotension 08/24/2019  . Hypothyroidism 07/06/2018  . Family history of congenital anomaly 08/27/2016   Social History   Socioeconomic History  . Marital status: Married    Spouse name: Not on file  . Number of children: 5  . Years of education: 22  . Highest education level: Not on file  Occupational History  . Occupation: stay at home mom  Tobacco Use  . Smoking status:  Never Smoker  . Smokeless tobacco: Never Used  Vaping Use  . Vaping Use: Never used  Substance and Sexual Activity  . Alcohol use: Yes    Comment: once a year  . Drug use: No  . Sexual activity: Yes    Birth control/protection: None  Other Topics Concern  . Not on file  Social History Narrative  . Not on file   Social Determinants of Health   Financial Resource Strain: Not on file  Food Insecurity: Not on file  Transportation Needs: Not on file  Physical Activity: Not on file  Stress: Stress Concern Present  . Feeling of Stress : Rather much  Social Connections: Not on file  Intimate Partner Violence: Not on file    Carol Welch's family history includes Breast cancer in her cousin, maternal aunt, maternal aunt, and maternal grandmother; Heart disease in her father and paternal grandfather.      Objective:    Vitals:   07/19/20 0939  BP: 100/80  Pulse: 76    Physical Exam Well-developed well-nourished  hispanic  in no acute distress.  Height, Weight, BMI31.7  HEENT; nontraumatic normocephalic, EOMI, PE R LA, sclera anicteric. Oropharynx; not examined today Neck; supple, no JVD Cardiovascular; regular rate and rhythm with S1-S2, no murmur rub or gallop Pulmonary; Clear bilaterally Abdomen; soft, healed laparoscopic incisional ports, is tender in the left lower quadrant, nondistended, no palpable mass or hepatosplenomegaly, bowel sounds are active Rectal; not done today Skin; benign exam, no jaundice rash or appreciable lesions Extremities; no clubbing cyanosis or edema skin warm and dry Neuro/Psych; alert and oriented x4, grossly nonfocal mood and affect appropriate       Assessment & Plan:   #26 45 year old female with new development of persistent constipation, as well as postprandial abdominal discomfort bloating and distention occurring frequently over the past 6 months since she underwent laparoscopic hysterectomy and BSO in August 2021. Her lower abdominal  pain tenderness and intermittent distention are concerning for symptoms secondary to adhesions, internal hernia, partial obstruction.  Rule out other intra-abdominal inflammatory process  #2 hypothyroidism 3.  History of depression  Plan; CBC, sed rate, BMET Patient will be scheduled for CT scan of the abdomen and pelvis with contrast. Stop Metamucil and switch to Benefiber due to complaints of bloating. Continue Senokot 1 or 2 at bedtime. Depending on results of CT scan she may require colonoscopy and will be established with Dr. Adela Lank. Would like her to see her gynecologist back after CT results prior to making decision about colonoscopy.  Carol Welch [place]ock PA-C 07/19/2020   Cc: Shirlean Mylar, MD

## 2020-07-19 NOTE — Progress Notes (Signed)
Agree with assessment and plan as outlined.  

## 2020-07-19 NOTE — Patient Instructions (Signed)
If you are age 44 or older, your body mass index should be between 23-30. Your Body mass index is 31.7 kg/m. If this is out of the aforementioned range listed, please consider follow up with your Primary Care Provider.  If you are age 77 or younger, your body mass index should be between 19-25. Your Body mass index is 31.7 kg/m. If this is out of the aformentioned range listed, please consider follow up with your Primary Care Provider.   Your provider has requested that you go to the basement level for lab work before leaving today. Press "B" on the elevator. The lab is located at the first door on the left as you exit the elevator.  You will be contacted by [facility] Scheduling in the next 2 days to arrange a CT Abdomen/Pelvis.  The number on your caller ID will be [phone], please answer when they call.  If you have not heard from them in 2 days please call [phone] to schedule.    START Benefiber 1-2 doses daily in a glass of water or juice daily.  Continue Senakot 1-2 tablet at bedtime.  Follow up pending at this time.  Thank you for entrusting me with your care and choosing [facility].  Sondra Come, PA-C

## 2020-07-19 NOTE — Progress Notes (Signed)
Office Visit Note   Patient: Carol Welch           Date of Birth: 1975/07/28           MRN: [id number] Visit Date: 07/19/2020              Requested by: McDiarmid, Leighton Roach, MD [address] [place],  [place] [address] PCP: Shirlean Mylar, MD   Assessment & Plan: Visit Diagnoses:  1. Chronic right shoulder pain     Plan: Impression is cervical strain and probable contusion to the right hand.  We have discussed starting a course of prednisone as well as muscle relaxers and I have sent in both.  She will follow up with [place] over the next several weeks if her symptoms have not improved.  Call with concerns or questions.  Follow-Up Instructions: Return if symptoms worsen or fail to improve.   Orders:  Orders Placed This Encounter  Procedures   XR Shoulder Right   Meds ordered this encounter  Medications   predniSONE (STERAPRED UNI-PAK 21 TAB) 10 MG (21) TBPK tablet    Sig: Take as directed    Dispense:  21 tablet    Refill:  0   methocarbamol (ROBAXIN) 500 MG tablet    Sig: Take 1 tablet (500 mg total) by mouth 2 (two) times daily as needed.    Dispense:  20 tablet    Refill:  0      Procedures: No procedures performed   Clinical Data: No additional findings.   Subjective: Chief Complaint  Patient presents with   Right Shoulder - Pain   Right Hand - Pain    HPI patient is a very pleasant 45 year old female who comes in today following an injury to her right upper extremity.  About a week to week and a half ago she was standing in her door when her son pushed the other side causing her to hyperextend the right index finger and fall backwards onto her right shoulder.  She has had pain to the right index finger and right arm and lateral neck since.  She is here today for further evaluation and treatment recommendation.  The pain to the shoulder is primarily to the anterior aspect and radiates to the lateral neck.  Worse with any motion of the shoulder as  well as the neck.  She notes occasional weakness to the right upper extremity.  She also notes burning to the right arm.  In regards to the right index finger, the pain she has is primarily to the second MCP joint and radiates into the second metacarpal.  Worse with motion of the index finger.  She has been taking over-the-counter pain medication without relief of symptoms.  Review of Systems as detailed in HPI.  All others reviewed and are negative.   Objective: Vital Signs: LMP 12/19/2019   Physical Exam well-developed well-nourished female no acute distress.  Alert oriented x3.  Ortho Exam right hand exam shows mild swelling to the second MCP joint.  Mild tenderness to the MCP joint and throughout the distal metacarpal.  She has full range of motion and strength of the index finger.  She is neurovascular intact distally.  Right shoulder exam full active range of motion all planes.  Minimally positive speeds test.  Negative O'Brien's.  Negative empty can.  Negative cross body adduction.  Cervical spine exam shows no spinous tenderness.  Mild right-sided paraspinous tenderness with tenderness into the parascapular region.  She does have  increased pain with rotation.  No focal weakness.  She is neurovascular intact distally.  Specialty Comments:  No specialty comments available.  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   Neck pain 01/18/2020   S/P laparoscopic assisted vaginal hysterectomy (LAVH) 12/30/2019   External thrombosed hemorrhoids 09/11/2019   MDD (major depressive disorder) 08/24/2019   Orthostatic hypotension 08/24/2019   Hypothyroidism 07/06/2018    Family history of congenital anomaly 08/27/2016   Past Medical History:  Diagnosis Date   Anal fissure    Colitis    Depression    Hypothyroidism    PONV (postoperative nausea and vomiting)    SVD (spontaneous vaginal delivery) 06/30/2015    Family History  Problem Relation Age of Onset   Heart disease Father    Breast cancer Maternal Aunt    Breast cancer Maternal Grandmother    Heart disease Paternal Grandfather    Breast cancer Maternal Aunt    Breast cancer Cousin        maternal    Past Surgical History:  Procedure Laterality Date   BREAST CYST ASPIRATION Right 02/13/2018   CYSTOSCOPY N/A 12/30/2019   Procedure: CYSTOSCOPY;  Surgeon: Sherian Rein, MD;  Location: [facility];  Service: Gynecology;  Laterality: N/A;   ENDOVENOUS ABLATION SAPHENOUS VEIN W/ LASER Right 10/08/2018   endovenous laser ablation right saphenous vein by Waverly Ferrari MD   LAPAROSCOPIC VAGINAL HYSTERECTOMY WITH SALPINGECTOMY Bilateral 12/30/2019   Procedure: LAPAROSCOPIC ASSISTED VAGINAL HYSTERECTOMY WITH SALPINGECTOMY;  Surgeon: Sherian Rein, MD;  Location: [facility];  Service: Gynecology;  Laterality: Bilateral;   Social History   Occupational History   Occupation: stay at home mom  Tobacco Use   Smoking status: Never Smoker   Smokeless tobacco: Never Used  Building services engineer Use: Never used  Substance and Sexual Activity   Alcohol use: Yes    Comment: once a year   Drug use: No   Sexual activity: Yes    Birth control/protection: None

## 2020-07-20 ENCOUNTER — Encounter (HOSPITAL_COMMUNITY): Payer: Self-pay | Admitting: Family Medicine

## 2020-07-20 ENCOUNTER — Encounter: Payer: Self-pay | Admitting: Family Medicine

## 2020-07-21 DIAGNOSIS — F331 Major depressive disorder, recurrent, moderate: Secondary | ICD-10-CM | POA: Diagnosis not present

## 2020-07-24 ENCOUNTER — Encounter: Payer: Self-pay | Admitting: Family Medicine

## 2020-07-25 ENCOUNTER — Encounter: Payer: Self-pay | Admitting: Family Medicine

## 2020-07-25 ENCOUNTER — Emergency Department (HOSPITAL_COMMUNITY): Payer: Medicaid Other

## 2020-07-25 ENCOUNTER — Ambulatory Visit (HOSPITAL_COMMUNITY)
Admission: EM | Admit: 2020-07-25 | Discharge: 2020-07-25 | Disposition: A | Discharge status: Home / Self Care | Payer: Medicaid Other

## 2020-07-25 ENCOUNTER — Encounter (HOSPITAL_COMMUNITY): Payer: Self-pay

## 2020-07-25 ENCOUNTER — Emergency Department (HOSPITAL_COMMUNITY)
Admission: EM | Admit: 2020-07-25 | Discharge: 2020-07-25 | Disposition: A | Discharge status: Home / Self Care | Payer: Medicaid Other | Attending: Emergency Medicine | Admitting: Emergency Medicine

## 2020-07-25 ENCOUNTER — Other Ambulatory Visit: Payer: Self-pay

## 2020-07-25 DIAGNOSIS — E039 Hypothyroidism, unspecified: Secondary | ICD-10-CM | POA: Diagnosis not present

## 2020-07-25 DIAGNOSIS — Z8742 Personal history of other diseases of the female genital tract: Secondary | ICD-10-CM | POA: Diagnosis not present

## 2020-07-25 DIAGNOSIS — K7689 Other specified diseases of liver: Secondary | ICD-10-CM | POA: Diagnosis not present

## 2020-07-25 DIAGNOSIS — Z1231 Encounter for screening mammogram for malignant neoplasm of breast: Secondary | ICD-10-CM | POA: Diagnosis not present

## 2020-07-25 DIAGNOSIS — K921 Melena: Secondary | ICD-10-CM | POA: Diagnosis not present

## 2020-07-25 DIAGNOSIS — K625 Hemorrhage of anus and rectum: Secondary | ICD-10-CM | POA: Diagnosis not present

## 2020-07-25 DIAGNOSIS — Z79899 Other long term (current) drug therapy: Secondary | ICD-10-CM | POA: Insufficient documentation

## 2020-07-25 DIAGNOSIS — R635 Abnormal weight gain: Secondary | ICD-10-CM | POA: Diagnosis not present

## 2020-07-25 DIAGNOSIS — R1032 Left lower quadrant pain: Secondary | ICD-10-CM | POA: Diagnosis not present

## 2020-07-25 DIAGNOSIS — Z90710 Acquired absence of both cervix and uterus: Secondary | ICD-10-CM | POA: Diagnosis not present

## 2020-07-25 DIAGNOSIS — R1033 Periumbilical pain: Secondary | ICD-10-CM | POA: Diagnosis present

## 2020-07-25 DIAGNOSIS — R5383 Other fatigue: Secondary | ICD-10-CM | POA: Diagnosis not present

## 2020-07-25 DIAGNOSIS — Z3009 Encounter for other general counseling and advice on contraception: Secondary | ICD-10-CM | POA: Diagnosis not present

## 2020-07-25 DIAGNOSIS — K644 Residual hemorrhoidal skin tags: Secondary | ICD-10-CM | POA: Diagnosis not present

## 2020-07-25 DIAGNOSIS — R6882 Decreased libido: Secondary | ICD-10-CM | POA: Diagnosis not present

## 2020-07-25 LAB — TYPE AND SCREEN
ABO/RH(D): A POS
Antibody Screen: NEGATIVE

## 2020-07-25 LAB — COMPREHENSIVE METABOLIC PANEL
ALT: 19 U/L (ref 0–44)
AST: 21 U/L (ref 15–41)
Albumin: 3.8 g/dL (ref 3.5–5.0)
Alkaline Phosphatase: 37 U/L — ABNORMAL LOW (ref 38–126)
Anion gap: 7 (ref 5–15)
BUN: 16 mg/dL (ref 6–20)
CO2: 26 mmol/L (ref 22–32)
Calcium: 9.2 mg/dL (ref 8.9–10.3)
Chloride: 105 mmol/L (ref 98–111)
Creatinine, Ser: 0.55 mg/dL (ref 0.44–1.00)
GFR, Estimated: >60 mL/min (ref >60)
Glucose, Bld: 107 mg/dL — ABNORMAL HIGH (ref 70–99)
Potassium: 3.7 mmol/L (ref 3.5–5.1)
Sodium: 138 mmol/L (ref 135–145)
Total Bilirubin: 0.4 mg/dL (ref 0.3–1.2)
Total Protein: 6.8 g/dL (ref 6.5–8.1)

## 2020-07-25 LAB — CBC
HCT: 39.3 % (ref 36.0–46.0)
Hemoglobin: 13.2 g/dL (ref 12.0–15.0)
MCH: 30.3 pg (ref 26.0–34.0)
MCHC: 33.6 g/dL (ref 30.0–36.0)
MCV: 90.1 fL (ref 80.0–100.0)
Platelets: 236 10*3/uL (ref 150–400)
RBC: 4.36 MIL/uL (ref 3.87–5.11)
RDW: 12.9 % (ref 11.5–15.5)
WBC: 6.8 10*3/uL (ref 4.0–10.5)
nRBC: 0 % (ref 0.0–0.2)

## 2020-07-25 LAB — POC OCCULT BLOOD, ED: Fecal Occult Bld: POSITIVE — AB

## 2020-07-25 MED ORDER — FENTANYL CITRATE (PF) 100 MCG/2ML IJ SOLN
50.0000 ug | Freq: Once | INTRAMUSCULAR | Status: AC
  Administered 2020-07-25: 50 ug via INTRAVENOUS
  Filled 2020-07-25: qty 2

## 2020-07-25 MED ORDER — IOHEXOL 300 MG/ML  SOLN
100.0000 mL | Freq: Once | INTRAMUSCULAR | Status: AC | PRN
  Administered 2020-07-25: 100 mL via INTRAVENOUS

## 2020-07-25 MED ORDER — DICYCLOMINE HCL 20 MG PO TABS: 20.0000 mg | ORAL_TABLET | Freq: Two times a day (BID) | ORAL | 0 refills | Status: DC

## 2020-07-25 NOTE — ED Provider Notes (Signed)
[facility]-EMERGENCY DEPT Provider Note   CSN: [id number] Arrival date & time: 07/25/20  1806     History Chief Complaint  Patient presents with  . Abdominal Pain  . Rectal Bleeding    Carol Welch is a 45 y.o. female with a past medical history of hypothyroidism, anal fissure, colitis presenting to the ED with a chief complaint of abdominal pain.  States that for the past 3 days she has been experiencing sharp periumbilical and left lower quadrant abdominal pain.  Has had some episodes of bright red blood per rectum as well as blood when she wipes the area.  She was told in the past that it was due to a hemorrhoid.  She reports nausea and has one episode of nonbloody, nonbilious emesis yesterday but denies any other vomiting.  She felt that this emesis was more so related to eating too much.  Denies any chest pain, shortness of breath or urinary symptoms.  States that she is not had this bleeding in the past.  Prior abdominal surgeries include hysterectomy.  She called her primary care provider today and was told to go to the ER.  However she did go to her OB/GYN prior to arrival here, said that they did an external rectal exam and noted a hemorrhoid.  She states that there was a speculum exam done without any abnormalities or concerns for vaginal bleeding.  Denies any anticoagulant use.  HPI     Past Medical History:  Diagnosis Date  . Anal fissure   . Colitis   . Depression   . Hypothyroidism   . PONV (postoperative nausea and vomiting)   . SVD (spontaneous vaginal delivery) 06/30/2015    Patient Active Problem List   Diagnosis Date Noted  . Welch trauma, right, initial encounter 07/18/2020  . Encounter for surveillance of abnormal nevi 07/09/2020  . Acquired hypothyroidism 07/07/2020  . 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 Neck pain 01/18/2020  . S/P laparoscopic assisted vaginal hysterectomy (LAVH) 12/30/2019  . External thrombosed  hemorrhoids 09/11/2019  . MDD (major depressive disorder) 08/24/2019  . Orthostatic hypotension 08/24/2019  . Hypothyroidism 07/06/2018  . Family history of congenital anomaly 08/27/2016    Past Surgical History:  Procedure Laterality Date  . BREAST CYST ASPIRATION Right 02/13/2018  . CYSTOSCOPY N/A 12/30/2019   Procedure: CYSTOSCOPY;  Surgeon: Sherian Rein, MD;  Location: [facility];  Service: Gynecology;  Laterality: N/A;  . ENDOVENOUS ABLATION SAPHENOUS VEIN W/ LASER Right 10/08/2018   endovenous laser ablation right saphenous vein by Waverly Ferrari MD  . LAPAROSCOPIC VAGINAL HYSTERECTOMY WITH SALPINGECTOMY Bilateral 12/30/2019   Procedure: LAPAROSCOPIC ASSISTED VAGINAL HYSTERECTOMY WITH SALPINGECTOMY;  Surgeon: Sherian Rein, MD;  Location: [facility];  Service: Gynecology;  Laterality: Bilateral;     OB History    Gravida  6   Para  5   Term  5   Preterm  0   AB  1   Living  5     SAB  1   IAB  0   Ectopic  0   Multiple  0   Live Births  2           Family History  Problem Relation Age of Onset  . Heart disease Father   . Breast cancer Maternal Aunt   . Breast cancer Maternal Grandmother   . Heart disease Paternal Grandfather   . Breast cancer Maternal Aunt   . Breast cancer Cousin  maternal    Social History   Tobacco Use  . Smoking status: Never Smoker  . Smokeless tobacco: Never Used  Vaping Use  . Vaping Use: Never used  Substance Use Topics  . Alcohol use: Yes    Comment: once a year  . Drug use: No    Home Medications Prior to Admission medications   Medication Sig Start Date End Date Taking? Authorizing Provider  acetaminophen (TYLENOL) 500 MG tablet Take 1,000 mg by mouth every 6 (six) hours as needed for mild pain, moderate pain, fever or headache.    Yes [provider]  busPIRone (BUSPAR) 15 MG tablet Take 1 tablet (15 mg total) by mouth 2 (two) times daily.  07/04/20  Yes Peggyann Shoals C, DO  cholecalciferol (VITAMIN D3) 25 MCG (1000 UNIT) tablet Take 1,000 Units by mouth daily.   Yes [provider]  Cyanocobalamin (B-12 PO) Take 1 tablet by mouth daily.   Yes [provider]  dicyclomine (BENTYL) 20 MG tablet Take 1 tablet (20 mg total) by mouth 2 (two) times daily. 07/25/20  Yes Khatri, Hina, PA-C  escitalopram (LEXAPRO) 20 MG tablet TAKE 1 AND 1/2 TABLETS(30 MG) BY MOUTH DAILY 07/04/20  Yes Peggyann Shoals C, DO  ibuprofen (ADVIL) 200 MG tablet Take 200 mg by mouth every 6 (six) hours as needed for mild pain.   Yes [provider]  Boris Lown Oil (OMEGA-3) 500 MG CAPS Take 500 mg by mouth daily.   Yes [provider]  LESSINA-28 0.1-20 MG-MCG tablet Take 1 tablet by mouth daily. 03/24/20  Yes [provider]  levothyroxine (SYNTHROID, LEVOTHROID) 100 MCG tablet Take 100 mcg by mouth daily before breakfast.    Yes [provider]  magnesium 30 MG tablet Take 30 mg by mouth daily.   Yes [provider]  Multiple Vitamins-Minerals (MULTIVITAMIN WITH MINERALS) tablet Take 1 tablet by mouth daily.   Yes [provider]  predniSONE (DELTASONE) 10 MG tablet Take 10 mg by mouth daily with breakfast.   Yes [provider]  Probiotic Product (PROBIOTIC PO) Take 1 capsule by mouth daily.   Yes [provider]  senna (SENOKOT) 8.6 MG TABS tablet Take 1 tablet (8.6 mg total) by mouth daily. 07/04/20  Yes Peggyann Shoals C, DO  ibuprofen (ADVIL) 800 MG tablet Take 1 tablet (800 mg total) by mouth every 8 (eight) hours as needed (mild pain). Patient not taking: Reported on 07/25/2020 12/31/19   Bovard-Stuckert, Augusto Gamble, MD  Lidocaine-Hydrocortisone Ace 1-1 % CREA Apply 1 application topically 2 (two) times daily. Patient not taking: Reported on 07/25/2020 03/17/20   Jamelle Rushing L, DO  methocarbamol (ROBAXIN) 500 MG tablet Take 1 tablet (500 mg total) by mouth 2 (two) times daily as  needed. Patient not taking: Reported on 07/25/2020 07/19/20   Cristie Hem, PA-C  nitroGLYCERIN (NITROGLYN) 2 % OINT ointment Apply 1 application topically every 12 (twelve) hours. Patient not taking: Reported on 07/25/2020 03/17/20   Jamelle Rushing L, DO  predniSONE (STERAPRED UNI-PAK 21 TAB) 10 MG (21) TBPK tablet Take as directed Patient not taking: Reported on 07/25/2020 07/19/20   Cristie Hem, PA-C  Psyllium (FIBER) 28.3 % POWD Take 1 Package by mouth 2 (two) times daily. Patient not taking: Reported on 07/25/2020 03/17/20   Jamelle Rushing L, DO    Allergies    Patient has no known allergies.  Review of Systems   Review of Systems  Constitutional: Negative for appetite change, chills and  fever.  HENT: Negative for ear pain, rhinorrhea, sneezing and sore throat.   Eyes: Negative for photophobia and visual disturbance.  Respiratory: Negative for cough, chest tightness, shortness of breath and wheezing.   Cardiovascular: Negative for chest pain and palpitations.  Gastrointestinal: Positive for abdominal pain and blood in stool. Negative for abdominal distention, constipation, diarrhea, nausea and vomiting.  Genitourinary: Negative for dysuria, hematuria and urgency.  Musculoskeletal: Negative for myalgias.  Skin: Negative for rash.  Neurological: Negative for dizziness, weakness and light-headedness.    Physical Exam Updated Vital Signs BP 119/90   Pulse 60   Temp 98.4 F (36.9 C) (Oral)   Resp 20   Ht 5' 2.5" (1.588 m)   Wt 80.7 kg   LMP 12/19/2019   SpO2 100%   BMI 32.04 kg/m   Physical Exam Vitals and nursing note reviewed. Exam conducted with a chaperone present.  Constitutional:      General: She is not in acute distress.    Appearance: She is well-developed.  HENT:     Head: Normocephalic and atraumatic.     Nose: Nose normal.  Eyes:     General: No scleral icterus.       Left eye: No discharge.     Conjunctiva/sclera: Conjunctivae normal.   Cardiovascular:     Rate and Rhythm: Normal rate and regular rhythm.     Heart sounds: Normal heart sounds. No murmur heard. No friction rub. No gallop.   Pulmonary:     Effort: Pulmonary effort is normal. No respiratory distress.     Breath sounds: Normal breath sounds.  Abdominal:     General: Bowel sounds are normal. There is no distension.     Palpations: Abdomen is soft.     Tenderness: There is abdominal tenderness in the periumbilical area and left lower quadrant. There is no guarding.  Genitourinary:    Comments: DRE with specks of bright red blood.  No tenderness. Musculoskeletal:        General: Normal range of motion.     Cervical back: Normal range of motion and neck supple.  Skin:    General: Skin is warm and dry.     Findings: No rash.  Neurological:     Mental Status: She is alert.     Motor: No abnormal muscle tone.     Coordination: Coordination normal.     ED Results / Procedures / Treatments   Labs (all labs ordered are listed, but only abnormal results are displayed) Labs Reviewed  COMPREHENSIVE METABOLIC PANEL - Abnormal; Notable for the following components:      Result Value   Glucose, Bld 107 (*)    Alkaline Phosphatase 37 (*)    All other components within normal limits  POC OCCULT BLOOD, ED - Abnormal; Notable for the following components:   Fecal Occult Bld POSITIVE (*)    All other components within normal limits  CBC  TYPE AND SCREEN    EKG None  Radiology CT ABDOMEN PELVIS W CONTRAST  Result Date: 07/25/2020 CLINICAL DATA:  Left lower quadrant pain EXAM: CT ABDOMEN AND PELVIS WITH CONTRAST TECHNIQUE: Multidetector CT imaging of the abdomen and pelvis was performed using the standard protocol following bolus administration of intravenous contrast. CONTRAST:  OMNIPAQUE IOHEXOL 300 MG/ML  SOLN COMPARISON:  09/18/2010 FINDINGS: Lower chest: Lung bases are clear. No effusions. Heart is normal size. Hepatobiliary: Small scattered cysts  in the liver. Small enhancing area within the right hepatic lobe measures 19 mm,  stable since prior study. This may reflect small hemangioma. Gallbladder unremarkable. Pancreas: No focal abnormality or ductal dilatation. Spleen: No focal abnormality.  Normal size. Adrenals/Urinary Tract: No adrenal abnormality. No focal renal abnormality. No stones or hydronephrosis. Urinary bladder is unremarkable. Stomach/Bowel: Stomach, large and small bowel grossly unremarkable. Moderate stool burden throughout the colon. Appendix not definitively seen. No pericecal inflammation. Vascular/Lymphatic: No evidence of aneurysm or adenopathy. Reproductive: Prior hysterectomy.  No adnexal masses. Other: No free fluid or free air. Musculoskeletal: No acute bony abnormality. IMPRESSION: No acute findings in the abdomen or pelvis. Moderate stool burden in the colon. Electronically Signed   By: Charlett NoseKevin  Dover M.D.   On: 07/25/2020 21:07    Procedures Procedures   Medications Ordered in ED Medications  fentaNYL (SUBLIMAZE) injection 50 mcg (50 mcg Intravenous Given 07/25/20 2002)  iohexol (OMNIPAQUE) 300 MG/ML solution 100 mL (100 mLs Intravenous Contrast Given 07/25/20 2042)    ED Course  I have reviewed the triage vital signs and the nursing notes.  Pertinent labs & imaging results that were available during my care of the patient were reviewed by me and considered in my medical decision making (see chart for details).  Clinical Course as of 07/25/20 2147  Tue Jul 25, 2020  2115 Fecal Occult Blood, POC(!): POSITIVE [HK]  2115 Hemoglobin: 13.2 [HK]    Clinical Course User Index [HK] Dietrich PatesKhatri, Hina, PA-C   MDM Rules/Calculators/A&P                          45 year old female with past medical history of hypothyroidism, colitis, anal fissures presenting to the ED with a chief complaint of abdominal pain.  3-day history of sharp periumbilical and left lower quadrant abdominal pain.  Bright red blood per rectum times a  few episodes and blood when she wipes the area.  She was sent to the ER from PCP for CT scan.  She denies any shortness of breath.  Reports emesis yesterday but she related this to eating too much.  Denies any urinary symptoms.  She did see her OB/GYN today for routine visit and was told that this was not vaginal bleeding.  Rectal exam revealed evidence of a hemorrhoid externally.  DRE with brown stool with some specks of blood.  This was Hemoccult positive.  Abdomen is tender in the left lower quadrant area.  Otherwise patient is in no acute distress.  She is hemodynamically stable.  CBC with normal hemoglobin here.  CMP unremarkable.  CT of the abdomen pelvis shows no acute findings.  Suspect that this bleeding could be related to her hemorrhoids.  No masses or lesions or signs of diverticulitis on CT.  Will give her symptomatic treatment to help with her abdominal pain and have her follow-up with GI.  Return precautions given.   Patient is hemodynamically stable, in NAD, and able to ambulate in the ED. Evaluation does not show pathology that would require ongoing emergent intervention or inpatient treatment. I explained the diagnosis to the patient. Pain has been managed and has no complaints prior to discharge. Patient is comfortable with above plan and is stable for discharge at this time. All questions were answered prior to disposition. Strict return precautions for returning to the ED were discussed. Encouraged follow up with PCP.   An After Visit Summary was printed and given to the patient.   Portions of this note were generated with Scientist, clinical (histocompatibility and immunogenetics)Dragon dictation software. Dictation errors may occur despite best  attempts at proofreading.  Final Clinical Impression(s) / ED Diagnoses Final diagnoses:  External hemorrhoids    Rx / DC Orders ED Discharge Orders         Ordered    dicyclomine (BENTYL) 20 MG tablet  2 times daily        07/25/20 2147           Dietrich Pates, Cordelia Poche 07/25/20 2147     Pollyann Savoy, MD 07/25/20 2218

## 2020-07-25 NOTE — Telephone Encounter (Signed)
Patient calls back to nurse line in regards to [employer]. Patient reports she continued to notice blood when she uses the bathroom. Patient advised to stop taking Aleve and proceed to UC or ED. Patient verbalized understanding.

## 2020-07-25 NOTE — ED Triage Notes (Signed)
Pt presents with generalized abdominal pain, rectal bleeding, and nausea X 3 days.

## 2020-07-25 NOTE — ED Triage Notes (Signed)
Patient c/o LLQ pain and rectal bleeding x 3 days.

## 2020-07-25 NOTE — Discharge Instructions (Addendum)
-  Head straight to [facility] ER for further evaluation and management of abdominal pain and rectal bleeding. -If you experience dizziness, loss of consciousness, chest pain, worsening abdominal pain etc. on the way to the ER stop and call 911.

## 2020-07-25 NOTE — Telephone Encounter (Signed)
Called patient to gather more information on myChart message. Unfortunately, patient did not answer. Left VM for patient to return call to office to discuss further.   Veronda Prude, RN

## 2020-07-25 NOTE — Discharge Instructions (Signed)
Take the medications to help with your symptoms. Follow-up with your primary care provider and the GI doctor listed below. Return to the ER if you start to experience worsening bleeding, severe abdominal pain, lightheadedness, chest pain or shortness of breath.

## 2020-07-25 NOTE — ED Notes (Signed)
Patient is being discharged from the Urgent Care and sent to the Emergency Department via POV. Per Alen Blew, PA, patient is in need of higher level of care due to abdominal pain and rectal bleeding. Patient is aware and verbalizes understanding of plan of care.  Vitals:   07/25/20 1725  BP: 114/69  Pulse: 60  Resp: 17  Temp: 98.1 F (36.7 C)  SpO2: 100%

## 2020-07-25 NOTE — ED Provider Notes (Signed)
[facility]    CSN: [id number] Arrival date & time: 07/25/20  1545      History   Chief Complaint Chief Complaint  Patient presents with  . Abdominal Pain  . Rectal Bleeding  . Nausea    HPI Carol Welch is a 45 y.o. female presenting for abdominal pain, rectal bleeding, nausea without vomiting.  History of anal fissure, hemorrhoids.  States she was fully examined by her OB/GYN today including a rectal exam, states everything was normal with this.  States her OB/GYN told her to go to the ER for abd CT and so she presented to this urgent care instead.  She also discussed this with her primary care today who also told her to go to the ER for abd CT.  Describes 3 days of profuse rectal bleeding without rectal pain.  Endorses 10 out of 10 left lower quadrant pain.  Last bowel movement was today and was normal.  Denies urinary symptoms.  HPI  Past Medical History:  Diagnosis Date  . Anal fissure   . Colitis   . Depression   . Hypothyroidism   . PONV (postoperative nausea and vomiting)   . SVD (spontaneous vaginal delivery) 06/30/2015    Patient Active Problem List   Diagnosis Date Noted  . Hand trauma, right, initial encounter 07/18/2020  . Encounter for surveillance of abnormal nevi 07/09/2020  . Acquired hypothyroidism 07/07/2020  . 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 Neck pain 01/18/2020  . S/P laparoscopic assisted vaginal hysterectomy (LAVH) 12/30/2019  . External thrombosed hemorrhoids 09/11/2019  . MDD (major depressive disorder) 08/24/2019  . Orthostatic hypotension 08/24/2019  . Hypothyroidism 07/06/2018  . Family history of congenital anomaly 08/27/2016    Past Surgical History:  Procedure Laterality Date  . BREAST CYST ASPIRATION Right 02/13/2018  . CYSTOSCOPY N/A 12/30/2019   Procedure: CYSTOSCOPY;  Surgeon: Sherian ReinBovard-Stuckert, Jody, MD;  [facility];  Service: Gynecology;  Laterality: N/A;  .  ENDOVENOUS ABLATION SAPHENOUS VEIN W/ LASER Right 10/08/2018   endovenous laser ablation right saphenous vein by Waverly Ferrarihristopher Dickson MD  . LAPAROSCOPIC VAGINAL HYSTERECTOMY WITH SALPINGECTOMY Bilateral 12/30/2019   Procedure: LAPAROSCOPIC ASSISTED VAGINAL HYSTERECTOMY WITH SALPINGECTOMY;  Surgeon: Sherian ReinBovard-Stuckert, Jody, MD;  Location: [facility];  Service: Gynecology;  Laterality: Bilateral;    OB History    Gravida  6   Para  5   Term  5   Preterm  0   AB  1   Living  5     SAB  1   IAB  0   Ectopic  0   Multiple  0   Live Births  2            Home Medications    Prior to Admission medications   Medication Sig Start Date End Date Taking? Authorizing Provider  acetaminophen (TYLENOL) 500 MG tablet Take 1,000 mg by mouth every 6 (six) hours as needed for mild pain, moderate pain, fever or headache.     [provider]  busPIRone (BUSPAR) 15 MG tablet Take 1 tablet (15 mg total) by mouth 2 (two) times daily. 07/04/20   Dollene ClevelandAnderson, Hannah C, DO  Cyanocobalamin (B-12 PO) Take by mouth.    [provider]  escitalopram (LEXAPRO) 20 MG tablet TAKE 1 AND 1/2 TABLETS(30 MG) BY MOUTH DAILY 07/04/20   Peggyann ShoalsAnderson, Hannah C, DO  ibuprofen (ADVIL) 800 MG tablet Take 1 tablet (800 mg total) by mouth every 8 (eight) hours as needed (  mild pain). 12/31/19   Bovard-Stuckert, Augusto Gamble, MD  Boris Lown Oil (OMEGA-3) 500 MG CAPS Take 500 mg by mouth daily.    [provider]  levothyroxine (SYNTHROID, LEVOTHROID) 100 MCG tablet Take 100 mcg by mouth daily before breakfast.     [provider]  Lidocaine-Hydrocortisone Ace 1-1 % CREA Apply 1 application topically 2 (two) times daily. 03/17/20   Anderson, Chelsey L, DO  methocarbamol (ROBAXIN) 500 MG tablet Take 1 tablet (500 mg total) by mouth 2 (two) times daily as needed. 07/19/20   Cristie Hem, PA-C  Multiple Vitamins-Minerals (MULTIVITAMIN WITH MINERALS) tablet Take 1 tablet by mouth daily.     [provider]  nitroGLYCERIN (NITROGLYN) 2 % OINT ointment Apply 1 application topically every 12 (twelve) hours. 03/17/20   Anderson, Chelsey L, DO  predniSONE (STERAPRED UNI-PAK 21 TAB) 10 MG (21) TBPK tablet Take as directed 07/19/20   Cristie Hem, PA-C  Psyllium (FIBER) 28.3 % POWD Take 1 Package by mouth 2 (two) times daily. 03/17/20   Anderson, Chelsey L, DO  senna (SENOKOT) 8.6 MG TABS tablet Take 1 tablet (8.6 mg total) by mouth daily. 07/04/20   Dollene Cleveland, DO    Family History Family History  Problem Relation Age of Onset  . Heart disease Father   . Breast cancer Maternal Aunt   . Breast cancer Maternal Grandmother   . Heart disease Paternal Grandfather   . Breast cancer Maternal Aunt   . Breast cancer Cousin        maternal    Social History Social History   Tobacco Use  . Smoking status: Never Smoker  . Smokeless tobacco: Never Used  Vaping Use  . Vaping Use: Never used  Substance Use Topics  . Alcohol use: Yes    Comment: once a year  . Drug use: No     Allergies   Patient has no known allergies.   Review of Systems Review of Systems  Constitutional: Negative for appetite change, chills, diaphoresis, fever and unexpected weight change.  HENT: Negative for congestion, ear pain, sinus pressure, sinus pain, sneezing, sore throat and trouble swallowing.   Respiratory: Negative for cough, chest tightness and shortness of breath.   Cardiovascular: Negative for chest pain.  Gastrointestinal: Positive for abdominal pain and blood in stool. Negative for abdominal distention, anal bleeding, constipation, diarrhea, nausea, rectal pain and vomiting.  Genitourinary: Negative for dysuria, flank pain, frequency and urgency.  Musculoskeletal: Negative for back pain and myalgias.  Neurological: Negative for dizziness, light-headedness and headaches.  All other systems reviewed and are negative.    Physical Exam Triage Vital Signs ED Triage Vitals   Enc Vitals Group     BP 07/25/20 1725 114/69     Pulse Rate 07/25/20 1725 60     Resp 07/25/20 1725 17     Temp 07/25/20 1725 98.1 F (36.7 C)     Temp Source 07/25/20 1725 Oral     SpO2 07/25/20 1725 100 %     Weight --      Height --      Head Circumference --      Peak Flow --      Pain Score 07/25/20 1726 6     Pain Loc --      Pain Edu? --      Excl. in GC? --    No data found.  Updated Vital Signs BP 114/69 (BP Location: Right Arm)   Pulse 60   Temp  98.1 F (36.7 C) (Oral)   Resp 17   LMP 12/19/2019   SpO2 100%   Visual Acuity Right Eye Distance:   Left Eye Distance:   Bilateral Distance:    Right Eye Near:   Left Eye Near:    Bilateral Near:     Physical Exam Vitals reviewed.  Constitutional:      General: She is not in acute distress.    Appearance: Normal appearance. She is not ill-appearing.  HENT:     Head: Normocephalic and atraumatic.     Mouth/Throat:     Mouth: Mucous membranes are moist.     Comments: Moist mucous membranes Eyes:     Extraocular Movements: Extraocular movements intact.     Pupils: Pupils are equal, round, and reactive to light.  Cardiovascular:     Rate and Rhythm: Normal rate and regular rhythm.     Heart sounds: Normal heart sounds.  Pulmonary:     Effort: Pulmonary effort is normal.     Breath sounds: Normal breath sounds. No wheezing, rhonchi or rales.  Abdominal:     General: Bowel sounds are normal. There is no distension.     Palpations: Abdomen is soft. There is no mass.     Tenderness: There is abdominal tenderness in the left lower quadrant. There is no right CVA tenderness, left CVA tenderness, guarding or rebound. Positive signs include Rovsing's sign. Negative signs include Murphy's sign and McBurney's sign.  Genitourinary:    Comments: Exam deferred Skin:    General: Skin is warm.     Capillary Refill: Capillary refill takes less than 2 seconds.     Comments: Good skin turgor  Neurological:      General: No focal deficit present.     Mental Status: She is alert and oriented to person, place, and time.  Psychiatric:        Mood and Affect: Mood normal.        Behavior: Behavior normal.      UC Treatments / Results  Labs (all labs ordered are listed, but only abnormal results are displayed) Labs Reviewed - No data to display  EKG   Radiology No results found.  Procedures Procedures (including critical care time)  Medications Ordered in UC Medications - No data to display  Initial Impression / Assessment and Plan / UC Course  I have reviewed the triage vital signs and the nursing notes.  Pertinent labs & imaging results that were available during my care of the patient were reviewed by me and considered in my medical decision making (see chart for details).     This patient is a 45 year old female presenting for left lower quadrant pain and painless rectal bleeding for 3 days.  Was told to go to the ER for an abdominal CT by OB/GYN and primary care so she presented to this urgent care.  OB/GYN did a full rectal exam and states that this was normal.  I am again sending this patient to the ER for abdominal CT.  She is currently hemodynamically stable for transport in personal vehicle.  Today this pt is afebrile nontachycardic nontachypneic, oxygenating well on room air, no wheezes rhonchi or rales.   This chart was dictated using voice recognition software, Dragon. Despite the best efforts of this provider to proofread and correct errors, errors may still occur which can change documentation meaning.   Final Clinical Impressions(s) / UC Diagnoses   Final diagnoses:  BRBPR (bright red blood per rectum)  Discharge Instructions     -Head straight to [facility] ER for further evaluation and management of abdominal pain and rectal bleeding. -If you experience dizziness, loss of consciousness, chest pain, worsening abdominal pain etc. on the way to the ER stop and  call 911.   ED Prescriptions    None     PDMP not reviewed this encounter.   Rhys Martini, PA-C 07/25/20 1750

## 2020-07-26 DIAGNOSIS — Z124 Encounter for screening for malignant neoplasm of cervix: Secondary | ICD-10-CM | POA: Diagnosis not present

## 2020-07-26 DIAGNOSIS — Z1151 Encounter for screening for human papillomavirus (HPV): Secondary | ICD-10-CM | POA: Diagnosis not present

## 2020-07-27 ENCOUNTER — Ambulatory Visit (HOSPITAL_COMMUNITY): Payer: Medicaid Other

## 2020-07-28 ENCOUNTER — Encounter: Payer: Self-pay | Admitting: Nurse Practitioner

## 2020-07-28 ENCOUNTER — Ambulatory Visit (INDEPENDENT_AMBULATORY_CARE_PROVIDER_SITE_OTHER): Payer: Medicaid Other | Admitting: Nurse Practitioner

## 2020-07-28 ENCOUNTER — Other Ambulatory Visit: Payer: Self-pay

## 2020-07-28 VITALS — BP 92/62 | HR 76 | Ht 62.0 in | Wt 180.0 lb

## 2020-07-28 DIAGNOSIS — K625 Hemorrhage of anus and rectum: Secondary | ICD-10-CM | POA: Diagnosis not present

## 2020-07-28 DIAGNOSIS — F331 Major depressive disorder, recurrent, moderate: Secondary | ICD-10-CM | POA: Diagnosis not present

## 2020-07-28 NOTE — Progress Notes (Signed)
ASSESSMENT AND PLAN    # 45 year old female with rectal bleeding last week.  She had epigastric and LLQ pain with the bleeding but has undergoing evaluation for abdominal pain ( see 07/19/20 office note).  Hemoglobin normal at 13.2.  Suspect the bleeding is secondary to hemorrhoids but will plan for colonoscopy to exclude other etiologies --Continue fiber and senokot for constipation --Patient will be scheduled for a colonoscopy. The risks and benefits of colonoscopy with possible polypectomy / biopsies were discussed and the patient agrees to proceed.   HISTORY OF PRESENT ILLNESS    Chief Complaint : Abdominal pain pain  Carol Welch is a 45 y.o. female known to Dr. Havery Moros with a past medical history of depression, hypothyroidism  07/19/20 - Patient evaluated by Nicoletta Ba, PA for bloating, constipation and lower abdominal pain.  Metamucil was changed to Benefiber due to bloating.  Senokot at bedtime was continued.  Labs and CT scan obtained.  Labs were normal.  CT scan not yet done  07/25/2020 - urgent care for abdominal pain, nausea and rectal bleeding.  Records reviewed . Per that note patient had seen the OB/GYN that day and everything was normal.  GYN told her to go to the ED for CT scan, she went to urgent care instead.  Repeat labs were unremarkable.  No acute findings on CT scan, moderate stool burden   INTERVAL HISTORY: Patient says the rectal bleeding started last Sunday and continued through Tuesday.  She passed bright red blood several times a day each day.  Her stools were normal, no diarrhea no constipation associated with the bleeding.  Her bowels have been moving well with Senokot and fiber. The bleeding was not associated with any rectal pain.  However, she did have LLQ as well as epigastric pain.  The pain was not relieved with defecation.  She really was not eating much when the bleeding started for fear of having to go to the bathroom and pass more blood.  Her pain  has improved as has the bleeding though the bleeding has not completely resolved.  She has been having low back pain since Tuesday.    DATA REVIEWED:  07/18/20 BMET normal CBC normal  ESR normal  07/25/20 FOBT positive CMP - ok CBC normal FOBT +  CT scan of the abdomen and pelvis with contrast --Small scattered liver cyst.  Gallbladder unremarkable.  Pancreas unremarkable.  Spleen.  Moderate stool burden throughout the colon.  No acute findings   Past Medical History:  Diagnosis Date  . Anal fissure   . Colitis   . Depression   . Hypothyroidism   . PONV (postoperative nausea and vomiting)   . SVD (spontaneous vaginal delivery) 06/30/2015    Current Medications, Allergies, Past Surgical History, Family History and Social History were reviewed in [employer] record.   Current Outpatient Medications  Medication Sig Dispense Refill  . acetaminophen (TYLENOL) 500 MG tablet Take 1,000 mg by mouth every 6 (six) hours as needed for mild pain, moderate pain, fever or headache.     . busPIRone (BUSPAR) 15 MG tablet Take 1 tablet (15 mg total) by mouth 2 (two) times daily. 60 tablet 2  . cholecalciferol (VITAMIN D3) 25 MCG (1000 UNIT) tablet Take 1,000 Units by mouth daily.    . Cyanocobalamin (B-12 PO) Take 1 tablet by mouth daily.    Marland Kitchen dicyclomine (BENTYL) 20 MG tablet Take 1 tablet (20 mg total) by mouth 2 (two) times daily.  20 tablet 0  . escitalopram (LEXAPRO) 20 MG tablet TAKE 1 AND 1/2 TABLETS(30 MG) BY MOUTH DAILY 90 tablet 3  . ibuprofen (ADVIL) 200 MG tablet Take 200 mg by mouth every 6 (six) hours as needed for mild pain.    Marland Kitchen ibuprofen (ADVIL) 800 MG tablet Take 1 tablet (800 mg total) by mouth every 8 (eight) hours as needed (mild pain). 30 tablet 0  . Krill Oil (OMEGA-3) 500 MG CAPS Take 500 mg by mouth daily.    . LESSINA-28 0.1-20 MG-MCG tablet Take 1 tablet by mouth daily.    Marland Kitchen levothyroxine (SYNTHROID, LEVOTHROID) 100 MCG tablet Take 100 mcg by  mouth daily before breakfast.     . magnesium 30 MG tablet Take 30 mg by mouth daily.    . methocarbamol (ROBAXIN) 500 MG tablet Take 1 tablet (500 mg total) by mouth 2 (two) times daily as needed. 20 tablet 0  . Multiple Vitamins-Minerals (MULTIVITAMIN WITH MINERALS) tablet Take 1 tablet by mouth daily.    . Probiotic Product (PROBIOTIC PO) Take 1 capsule by mouth daily.    . Psyllium (FIBER) 28.3 % POWD Take 1 Package by mouth 2 (two) times daily. 368 g 0  . senna (SENOKOT) 8.6 MG TABS tablet Take 1 tablet (8.6 mg total) by mouth daily. 90 tablet 3   No current facility-administered medications for this visit.    Review of Systems: No chest pain. No shortness of breath. No urinary complaints.   PHYSICAL EXAM :    Wt Readings from Last 3 Encounters:  07/28/20 180 lb (81.6 kg)  07/25/20 178 lb (80.7 kg)  07/19/20 176 lb 2 oz (79.9 kg)    BP 92/62   Pulse 76   Ht 5' 2"  (1.575 m)   Wt 180 lb (81.6 kg)   LMP 12/19/2019   BMI 32.92 kg/m  Constitutional:  Pleasant female in no acute distress. Psychiatric: Normal mood and affect. Behavior is normal. EENT: Pupils normal.  Conjunctivae are normal. No scleral icterus. Neck supple.  Cardiovascular: Normal rate, regular rhythm. No edema Pulmonary/chest: Effort normal and breath sounds normal. No wheezing, rales or rhonchi. Abdominal: Soft, nondistended, nontender. Bowel sounds active throughout. There are no masses palpable. No hepatomegaly. Rectal: small external hemorrhoid. No blood or stool in vault Neurological: Alert and oriented to person place and time. Skin: Skin is warm and dry. No rashes noted.  Tye Savoy, NP  07/28/2020, 10:57 AM

## 2020-07-28 NOTE — Progress Notes (Signed)
Agree with assessment and plan as outlined.  

## 2020-07-28 NOTE — Patient Instructions (Signed)
If you are age 44 or older, your body mass index should be between 23-30. Your Body mass index is 32.92 kg/m. If this is out of the aforementioned range listed, please consider follow up with your Primary Care Provider.  If you are age 95 or younger, your body mass index should be between 19-25. Your Body mass index is 32.92 kg/m. If this is out of the aformentioned range listed, please consider follow up with your Primary Care Provider.   You have been scheduled for a colonoscopy. Please follow written instructions given to you at your visit today.  Please pick up your prep supplies at the pharmacy within the next 1-3 days. If you use inhalers (even only as needed), please bring them with you on the day of your procedure.  Follow up pending the result of your Colonoscopy.  Thank you for entrusting me with your care and choosing [facility].  Willette Cluster, NP-C

## 2020-08-04 DIAGNOSIS — Z419 Encounter for procedure for purposes other than remedying health state, unspecified: Secondary | ICD-10-CM | POA: Diagnosis not present

## 2020-08-04 DIAGNOSIS — F331 Major depressive disorder, recurrent, moderate: Secondary | ICD-10-CM | POA: Diagnosis not present

## 2020-08-07 ENCOUNTER — Ambulatory Visit: Payer: Medicaid Other | Admitting: Physician Assistant

## 2020-08-10 ENCOUNTER — Other Ambulatory Visit (INDEPENDENT_AMBULATORY_CARE_PROVIDER_SITE_OTHER): Payer: Medicaid Other

## 2020-08-10 ENCOUNTER — Other Ambulatory Visit: Payer: Self-pay

## 2020-08-10 DIAGNOSIS — E039 Hypothyroidism, unspecified: Secondary | ICD-10-CM

## 2020-08-10 LAB — TSH: TSH: 0.03 u[IU]/mL — ABNORMAL LOW (ref 0.35–4.50)

## 2020-08-10 MED ORDER — LEVOTHYROXINE SODIUM 88 MCG PO TABS: 88.0000 ug | ORAL_TABLET | Freq: Every day | ORAL | 1 refills | Status: DC

## 2020-08-11 ENCOUNTER — Encounter: Payer: Self-pay | Admitting: Gastroenterology

## 2020-08-11 ENCOUNTER — Ambulatory Visit (AMBULATORY_SURGERY_CENTER): Payer: Medicaid Other | Admitting: Gastroenterology

## 2020-08-11 VITALS — BP 98/43 | HR 69 | Temp 97.7°F | Resp 14 | Ht 62.0 in | Wt 180.0 lb

## 2020-08-11 DIAGNOSIS — K648 Other hemorrhoids: Secondary | ICD-10-CM | POA: Diagnosis not present

## 2020-08-11 DIAGNOSIS — E039 Hypothyroidism, unspecified: Secondary | ICD-10-CM | POA: Diagnosis not present

## 2020-08-11 DIAGNOSIS — K625 Hemorrhage of anus and rectum: Secondary | ICD-10-CM

## 2020-08-11 MED ORDER — SODIUM CHLORIDE 0.9 % IV SOLN: 500.0000 mL | Freq: Once | INTRAVENOUS | Status: DC

## 2020-08-11 NOTE — Patient Instructions (Signed)
Handout :  Hemorrhoids Resume previous diet Continue current medications: including fiber supplement Repeat colonoscopy in 10 years  YOU HAD AN ENDOSCOPIC PROCEDURE TODAY AT THE  ENDOSCOPY CENTER:   Refer to the procedure report that was given to you for any specific questions about what was found during the examination.  If the procedure report does not answer your questions, please call your gastroenterologist to clarify.  If you requested that your care partner not be given the details of your procedure findings, then the procedure report has been included in a sealed envelope for you to review at your convenience later.  YOU SHOULD EXPECT: Some feelings of bloating in the abdomen. Passage of more gas than usual.  Walking can help get rid of the air that was put into your GI tract during the procedure and reduce the bloating. If you had a lower endoscopy (such as a colonoscopy or flexible sigmoidoscopy) you may notice spotting of blood in your stool or on the toilet paper. If you underwent a bowel prep for your procedure, you may not have a normal bowel movement for a few days.  Please Note:  You might notice some irritation and congestion in your nose or some drainage.  This is from the oxygen used during your procedure.  There is no need for concern and it should clear up in a day or so.  SYMPTOMS TO REPORT IMMEDIATELY:   Following lower endoscopy (colonoscopy or flexible sigmoidoscopy):  Excessive amounts of blood in the stool  Significant tenderness or worsening of abdominal pains  Swelling of the abdomen that is new, acute  Fever of 100F or higher  For urgent or emergent issues, a gastroenterologist can be reached at any hour by calling [phone]. Do not use MyChart messaging for urgent concerns.   DIET:  We do recommend a small meal at first, but then you may proceed to your regular diet.  Drink plenty of fluids but you should avoid alcoholic beverages for 24  hours.  ACTIVITY:  You should plan to take it easy for the rest of today and you should NOT DRIVE or use heavy machinery until tomorrow (because of the sedation medicines used during the test).    FOLLOW UP: Our staff will call the number listed on your records 48-72 hours following your procedure to check on you and address any questions or concerns that you may have regarding the information given to you following your procedure. If we do not reach you, we will leave a message.  We will attempt to reach you two times.  During this call, we will ask if you have developed any symptoms of COVID 19. If you develop any symptoms (ie: fever, flu-like symptoms, shortness of breath, cough etc.) before then, please call [phone].  If you test positive for Covid 19 in the 2 weeks post procedure, please call and report this information to [place].    If any biopsies were taken you will be contacted by phone or by letter within the next 1-3 weeks.  Please call us at [phone] if you have not heard about the biopsies in 3 weeks.   SIGNATURES/CONFIDENTIALITY: You and/or your care partner have signed paperwork which will be entered into your electronic medical record.  These signatures attest to the fact that that the information above on your After Visit Summary has been reviewed and is understood.  Full responsibility of the confidentiality of this discharge information lies with you and/or your care-partner.

## 2020-08-11 NOTE — Progress Notes (Signed)
SS - VS

## 2020-08-11 NOTE — Op Note (Signed)
[facility] Patient Name: Carol Welch Procedure Date: 08/11/2020 1:26 PM MRN: [id number] Endoscopist: Viviann SpareSteven P. Adela LankArmbruster , MD Age: 45 Referring MD:  Date of Birth: [birth year] Gender: Female Account #: [id number] Procedure:                Colonoscopy Indications:              Rectal bleeding - improved with fiber                            supplementation, history of left sided abdominal                            pain - resolved Medicines:                Monitored Anesthesia Care Procedure:                Pre-Anesthesia Assessment:                           - Prior to the procedure, a History and Physical                            was performed, and patient medications and                            allergies were reviewed. The patient's tolerance of                            previous anesthesia was also reviewed. The risks                            and benefits of the procedure and the sedation                            options and risks were discussed with the patient.                            All questions were answered, and informed consent                            was obtained. Prior Anticoagulants: The patient has                            taken no previous anticoagulant or antiplatelet                            agents. ASA Grade Assessment: II - A patient with                            mild systemic disease. After reviewing the risks                            and benefits, the patient was deemed in  satisfactory condition to undergo the procedure.                           After obtaining informed consent, the colonoscope                            was passed under direct vision. Throughout the                            procedure, the patient's blood pressure, pulse, and                            oxygen saturations were monitored continuously. The                            Olympus PCF-H190DL (#[id number]) Colonoscope was                             introduced through the anus and advanced to the the                            terminal ileum, with identification of the                            appendiceal orifice and IC valve. The colonoscopy                            was performed without difficulty. The patient                            tolerated the procedure well. The quality of the                            bowel preparation was good. The terminal ileum,                            ileocecal valve, appendiceal orifice, and rectum                            were photographed. Scope In: 1:29:50 PM Scope Out: 1:49:03 PM Scope Withdrawal Time: 0 hours 15 minutes 1 second  Total Procedure Duration: 0 hours 19 minutes 13 seconds  Findings:                 The perianal and digital rectal examinations were                            normal.                           The terminal ileum appeared normal.                           Internal hemorrhoids were found during  retroflexion. The hemorrhoids were small.                           The exam was otherwise without abnormality. No                            polyps. Complications:            No immediate complications. Estimated blood loss:                            None. Estimated Blood Loss:     Estimated blood loss: none. Impression:               - The examined portion of the ileum was normal.                           - Internal hemorrhoids.                           - The examination was otherwise normal.                           - No polyps.                           Internal hemorrhoids are the likely cause for                            bleeding symptoms. This has been improved with                            fiber supplementation Recommendation:           - Patient has a contact number available for                            emergencies. The signs and symptoms of potential                            delayed complications  were discussed with the                            patient. Return to normal activities tomorrow.                            Written discharge instructions were provided to the                            patient.                           - Resume previous diet.                           - Continue present medications.                           -  Continue fiber supplementation daily                           - Follow up in the clinic as needed if bleeding                            symptoms recur / persist                           - Repeat colonoscopy in 10 years for screening                            purposes. Viviann Spare P. Astra Gregg, MD 08/11/2020 1:53:41 PM This report has been signed electronically.

## 2020-08-11 NOTE — Progress Notes (Signed)
Report given to PACU, vss 

## 2020-08-12 DIAGNOSIS — F331 Major depressive disorder, recurrent, moderate: Secondary | ICD-10-CM | POA: Diagnosis not present

## 2020-08-15 ENCOUNTER — Telehealth: Payer: Self-pay | Admitting: *Deleted

## 2020-08-15 NOTE — Telephone Encounter (Signed)
Attempted f/u phone call. No answer. Left message. °

## 2020-08-15 NOTE — Telephone Encounter (Signed)
  Follow up Call-  Call back number 08/11/2020  Post procedure Call Back phone  # #[phone] cell  Permission to leave phone message Yes  Some recent data might be hidden     Patient questions:  Do you have a fever, pain , or abdominal swelling? Yes.   Pain Score  0 * -pt reports mild abd soreness. States it is getting better with each passing day. Instructed pt to call back if pain worsens.   Have you tolerated food without any problems? Yes.    Have you been able to return to your normal activities? Yes.    Do you have any questions about your discharge instructions: Diet   No. Medications  No. Follow up visit  No.  Do you have questions or concerns about your Care? No.  Actions: * If pain score is 4 or above: No action needed, pain <4.  1. Have you developed a fever since your procedure? no  2.   Have you had an respiratory symptoms (SOB or cough) since your procedure? no  3.   Have you tested positive for COVID 19 since your procedure no  4.   Have you had any family members/close contacts diagnosed with the COVID 19 since your procedure?  no   If yes to any of these questions please route to Laverna Peace, RN and Karlton Lemon, RN

## 2020-08-19 DIAGNOSIS — F331 Major depressive disorder, recurrent, moderate: Secondary | ICD-10-CM | POA: Diagnosis not present

## 2020-08-25 DIAGNOSIS — F331 Major depressive disorder, recurrent, moderate: Secondary | ICD-10-CM | POA: Diagnosis not present

## 2020-09-03 DIAGNOSIS — Z419 Encounter for procedure for purposes other than remedying health state, unspecified: Secondary | ICD-10-CM | POA: Diagnosis not present

## 2020-09-05 DIAGNOSIS — R300 Dysuria: Secondary | ICD-10-CM | POA: Diagnosis not present

## 2020-09-05 DIAGNOSIS — N898 Other specified noninflammatory disorders of vagina: Secondary | ICD-10-CM | POA: Diagnosis not present

## 2020-09-09 DIAGNOSIS — F331 Major depressive disorder, recurrent, moderate: Secondary | ICD-10-CM | POA: Diagnosis not present

## 2020-09-15 ENCOUNTER — Other Ambulatory Visit: Payer: Self-pay | Admitting: Family Medicine

## 2020-09-16 DIAGNOSIS — F331 Major depressive disorder, recurrent, moderate: Secondary | ICD-10-CM | POA: Diagnosis not present

## 2020-09-21 DIAGNOSIS — R300 Dysuria: Secondary | ICD-10-CM | POA: Diagnosis not present

## 2020-09-21 DIAGNOSIS — N644 Mastodynia: Secondary | ICD-10-CM | POA: Diagnosis not present

## 2020-09-23 DIAGNOSIS — F331 Major depressive disorder, recurrent, moderate: Secondary | ICD-10-CM | POA: Diagnosis not present

## 2020-09-27 ENCOUNTER — Other Ambulatory Visit: Payer: Self-pay | Admitting: Obstetrics and Gynecology

## 2020-09-27 DIAGNOSIS — N644 Mastodynia: Secondary | ICD-10-CM

## 2020-10-02 DIAGNOSIS — F331 Major depressive disorder, recurrent, moderate: Secondary | ICD-10-CM | POA: Diagnosis not present

## 2020-10-03 ENCOUNTER — Other Ambulatory Visit: Payer: Self-pay

## 2020-10-03 ENCOUNTER — Ambulatory Visit (INDEPENDENT_AMBULATORY_CARE_PROVIDER_SITE_OTHER): Payer: Medicaid Other | Admitting: Family Medicine

## 2020-10-03 ENCOUNTER — Encounter: Payer: Self-pay | Admitting: Student in an Organized Health Care Education/Training Program

## 2020-10-03 DIAGNOSIS — K529 Noninfective gastroenteritis and colitis, unspecified: Secondary | ICD-10-CM | POA: Insufficient documentation

## 2020-10-03 NOTE — Patient Instructions (Signed)
Thank you for coming to see me today. It was a pleasure. Today we discussed your diarrhea and vomiting. I think it is because of a viral gastroenteritis. I recommend staying as hydrated as possible and eating bland foods. You can buy oral hydration salts over the counter. If your vomiting, diarrhea worses, you get fevers, dizzy then go to the ER.  Please follow-up with PCP if you do not improve.   If you have any questions or concerns, please do not hesitate to call the office at [phone].  Best wishes,   Dr Allena Katz     Viral Gastroenteritis, Adult  Viral gastroenteritis is also known as the stomach flu. This condition may affect your stomach, your small intestine, and your large intestine. It can cause sudden watery poop (diarrhea), fever, and throwing up (vomiting). This condition is caused by certain germs (viruses). These germs can be passed from person to person very easily (are contagious). Having watery poop and throwing up can make you feel weak and cause you to not have enough water in your body (get dehydrated). This can make you tired and thirsty, make you have a dry mouth, and make it so you pee (urinate) less often. It is important to replace the fluids that you lose from having watery poop and throwing up. What are the causes?  You can get sick by catching viruses from other people.  You can also get sick by: ? Eating food, drinking water, or touching a surface that has the viruses on it (is contaminated). ? Sharing utensils or other personal items with a person who is sick. What increases the risk?  Having a weak body defense system (immune system).  Living with one or more children who are younger than 64 years old.  Living in a nursing home.  Going on cruise ships. What are the signs or symptoms? Symptoms of this condition start suddenly. Symptoms may last for a few days or for as long as a week.  Common symptoms include: ? Watery poop. ? Throwing up.  Other  symptoms include: ? Fever. ? Headache. ? Feeling tired (fatigue). ? Pain in the belly (abdomen). ? Chills. ? Feeling weak. ? Feeling sick to your stomach (nauseous). ? Muscle aches. ? Not feeling hungry. How is this treated?  This condition typically goes away on its own.  The focus of treatment is to replace the fluids that you lose. This condition may be treated with: ? An ORS (oral rehydration solution). This is a drink that is sold at pharmacies and stores. ? Medicines to help with your symptoms. ? Probiotic supplements to reduce symptoms of diarrhea. ? Fluids given through an IV tube, if needed.  Older adults and people with other diseases or a weak body defense system are at higher risk for not having enough water in the body. Follow these instructions at home: Eating and drinking  Take an ORS as told by your doctor.  Drink clear fluids in small amounts as you are able. Clear fluids include: ? Water. ? Ice chips. ? Fruit juice with water added to it (diluted). ? Low-calorie sports drinks.  Drink enough fluid to keep your pee (urine) pale yellow.  Eat small amounts of healthy foods every 3-4 hours as you are able. This may include whole grains, fruits, vegetables, lean meats, and yogurt.  Avoid fluids that have a lot of sugar or caffeine in them, such as energy drinks, sports drinks, and soda.  Avoid spicy or fatty foods.  Avoid alcohol.   General instructions  Wash your hands often. This is very important after you have watery poop or you throw up. If you cannot use soap and water, use hand sanitizer.  Make sure that all people in your home wash their hands well and often.  Take over-the-counter and prescription medicines only as told by your doctor.  Rest at home while you get better.  Watch your condition for any changes.  Take a warm bath to help with any burning or pain from having watery poop.  Keep all follow-up visits as told by your doctor. This is  important.   Contact a doctor if:  You cannot keep fluids down.  Your symptoms get worse.  You have new symptoms.  You feel light-headed.  You feel dizzy.  You have muscle cramps. Get help right away if:  You have chest pain.  You feel very weak.  You pass out (faint).  You see blood in your throw-up.  Your throw-up looks like coffee grounds.  You have bloody or black poop (stools) or poop that looks like tar.  You have a very bad headache, or a stiff neck, or both.  You have a rash.  You have very bad pain, cramping, or bloating in your belly.  You have trouble breathing.  You are breathing very quickly.  You have a fast heartbeat.  Your skin feels cold and clammy.  You feel mixed up (confused).  You have pain when you pee.  You have signs of not having enough water in the body, such as: ? Dark pee, hardly any pee, or no pee. ? Cracked lips. ? Dry mouth. ? Sunken eyes. ? Feeling very sleepy. ? Feeling weak. Summary  Viral gastroenteritis is also known as the stomach flu.  This condition can cause sudden watery poop (diarrhea), fever, and throwing up (vomiting).  These germs can be passed from person to person very easily.  Take an ORS as told by your doctor. This is a drink that is sold at pharmacies and stores.  Drink fluids in small amounts many times each day as you are able. This information is not intended to replace advice given to you by your health care provider. Make sure you discuss any questions you have with your health care provider. Document Revised: 02/25/2018 Document Reviewed: 02/25/2018 Elsevier Patient Education  2021 [employer].

## 2020-10-03 NOTE — Progress Notes (Addendum)
     SUBJECTIVE:   CHIEF COMPLAINT / HPI:   Carol Welch is a 45 y.o. female presents for vomiting  Primary symptom: vomiting after eating pork loins takeaway  Duration: 2 days ago, 5 times a day  Associated symptoms: watery diarrhea 5 times, reduced appetite, able to eat bland foods, breast tenderness and back tenderness. Denies dysuria, frequency, hematuria  Fever? Tmax?: no  Sick contacts:no  Covid test: none  Covid vaccination(s): 2 vaccines  Pt has hx of hysterectomy   Flowsheet Row Office Visit from 07/18/2020 in [facility]  PHQ-9 Total Score 10       PERTINENT  PMH / PSH: depression, hypothyroidism   OBJECTIVE:   BP 111/64   Pulse 75   Temp 99.1 F (37.3 C) (Oral)   Ht 5\' 2"  (1.575 m)   LMP 12/19/2019   SpO2 99%   BMI 32.92 kg/m    General: Alert, no acute distress,  Cardio: Normal S1 and S2, RRR, no r/m/g Pulm: CTAB, normal work of breathing Abdomen: Bowel sounds normal. Abdomen soft, nondistended, generalized tenderness, no guarding. No CVA tenderness.  Extremities: No peripheral edema.  Neuro: Cranial nerves grossly intact   ASSESSMENT/PLAN:   Gastroenteritis Most likely viral gastroenteritis following eating takeout 2 days ago.  Vomiting has stopped but patient is still having a few episodes of watery diarrhea.  Patient is well-appearing, moist mucous membranes, has mild generalized abdominal tenderness.  Patient has been concerned about generalized myalgias which I explained are in keeping with a viral infection.  Recommended that she monitors the symptoms.  Recommend conservative management for the gastroenteritis and to eat bland foods. Follow-up with PCP if persistent symptoms.  Recommended evaluation in the ER if she has worsening of diarrhea, vomiting, fevers, dizziness etc.  Patient expressed understanding and is happy with the plan.     12/21/2019, MD PGY-2 [facility]

## 2020-10-03 NOTE — Assessment & Plan Note (Addendum)
Most likely viral gastroenteritis following eating takeout 2 days ago.  Vomiting has stopped but patient is still having a few episodes of watery diarrhea.  Patient is well-appearing, moist mucous membranes, has mild generalized abdominal tenderness.  Patient has been concerned about generalized myalgias which I explained are in keeping with a viral infection.  Recommended that she monitors the symptoms.  Recommend conservative management for the gastroenteritis and to eat bland foods. Follow-up with PCP if persistent symptoms.  Recommended evaluation in the ER if she has worsening of diarrhea, vomiting, fevers, dizziness etc.  Patient expressed understanding and is happy with the plan.

## 2020-10-04 ENCOUNTER — Emergency Department (HOSPITAL_BASED_OUTPATIENT_CLINIC_OR_DEPARTMENT_OTHER)
Admission: EM | Admit: 2020-10-04 | Discharge: 2020-10-04 | Disposition: A | Discharge status: Home / Self Care | Payer: Medicaid Other | Attending: Emergency Medicine | Admitting: Emergency Medicine

## 2020-10-04 ENCOUNTER — Other Ambulatory Visit: Payer: Self-pay

## 2020-10-04 ENCOUNTER — Encounter: Payer: Self-pay | Admitting: Family Medicine

## 2020-10-04 ENCOUNTER — Encounter (HOSPITAL_BASED_OUTPATIENT_CLINIC_OR_DEPARTMENT_OTHER): Payer: Self-pay | Admitting: *Deleted

## 2020-10-04 DIAGNOSIS — Z419 Encounter for procedure for purposes other than remedying health state, unspecified: Secondary | ICD-10-CM | POA: Diagnosis not present

## 2020-10-04 DIAGNOSIS — Z20822 Contact with and (suspected) exposure to covid-19: Secondary | ICD-10-CM | POA: Insufficient documentation

## 2020-10-04 DIAGNOSIS — Z79899 Other long term (current) drug therapy: Secondary | ICD-10-CM | POA: Diagnosis not present

## 2020-10-04 DIAGNOSIS — E039 Hypothyroidism, unspecified: Secondary | ICD-10-CM | POA: Insufficient documentation

## 2020-10-04 DIAGNOSIS — A084 Viral intestinal infection, unspecified: Secondary | ICD-10-CM | POA: Diagnosis not present

## 2020-10-04 DIAGNOSIS — R531 Weakness: Secondary | ICD-10-CM | POA: Diagnosis not present

## 2020-10-04 DIAGNOSIS — R109 Unspecified abdominal pain: Secondary | ICD-10-CM | POA: Diagnosis present

## 2020-10-04 LAB — RESP PANEL BY RT-PCR (FLU A&B, COVID) ARPGX2
Influenza A by PCR: NEGATIVE
Influenza B by PCR: NEGATIVE
SARS Coronavirus 2 by RT PCR: NEGATIVE

## 2020-10-04 LAB — CBC WITH DIFFERENTIAL/PLATELET
Abs Immature Granulocytes: 0.01 10*3/uL (ref 0.00–0.07)
Basophils Absolute: 0.1 10*3/uL (ref 0.0–0.1)
Basophils Relative: 1 %
Eosinophils Absolute: 0.3 10*3/uL (ref 0.0–0.5)
Eosinophils Relative: 5 %
HCT: 37 % (ref 36.0–46.0)
Hemoglobin: 12.7 g/dL (ref 12.0–15.0)
Immature Granulocytes: 0 %
Lymphocytes Relative: 42 %
Lymphs Abs: 2.6 10*3/uL (ref 0.7–4.0)
MCH: 30 pg (ref 26.0–34.0)
MCHC: 34.3 g/dL (ref 30.0–36.0)
MCV: 87.3 fL (ref 80.0–100.0)
Monocytes Absolute: 0.7 10*3/uL (ref 0.1–1.0)
Monocytes Relative: 11 %
Neutro Abs: 2.6 10*3/uL (ref 1.7–7.7)
Neutrophils Relative %: 41 %
Platelets: 245 10*3/uL (ref 150–400)
RBC: 4.24 MIL/uL (ref 3.87–5.11)
RDW: 14.5 % (ref 11.5–15.5)
WBC: 6.3 10*3/uL (ref 4.0–10.5)
nRBC: 0 % (ref 0.0–0.2)

## 2020-10-04 LAB — COMPREHENSIVE METABOLIC PANEL
ALT: 15 U/L (ref 0–44)
AST: 17 U/L (ref 15–41)
Albumin: 4 g/dL (ref 3.5–5.0)
Alkaline Phosphatase: 37 U/L — ABNORMAL LOW (ref 38–126)
Anion gap: 7 (ref 5–15)
BUN: 18 mg/dL (ref 6–20)
CO2: 27 mmol/L (ref 22–32)
Calcium: 9 mg/dL (ref 8.9–10.3)
Chloride: 105 mmol/L (ref 98–111)
Creatinine, Ser: 0.57 mg/dL (ref 0.44–1.00)
GFR, Estimated: >60 mL/min (ref >60)
Glucose, Bld: 93 mg/dL (ref 70–99)
Potassium: 3.9 mmol/L (ref 3.5–5.1)
Sodium: 139 mmol/L (ref 135–145)
Total Bilirubin: 0.3 mg/dL (ref 0.3–1.2)
Total Protein: 6.4 g/dL — ABNORMAL LOW (ref 6.5–8.1)

## 2020-10-04 LAB — TROPONIN I (HIGH SENSITIVITY): Troponin I (High Sensitivity): <2 ng/L (ref <18)

## 2020-10-04 LAB — LIPASE, BLOOD: Lipase: 27 U/L (ref 11–51)

## 2020-10-04 MED ORDER — DICYCLOMINE HCL 20 MG PO TABS: 20.0000 mg | ORAL_TABLET | Freq: Two times a day (BID) | ORAL | 0 refills | Status: DC | PRN

## 2020-10-04 MED ORDER — ONDANSETRON 4 MG PO TBDP: 4.0000 mg | ORAL_TABLET | Freq: Three times a day (TID) | ORAL | 0 refills | Status: AC | PRN

## 2020-10-04 NOTE — ED Provider Notes (Signed)
MEDCENTER [facility] EMERGENCY DEPT Provider Note   CSN: [id number] Arrival date & time: 10/04/20  1512     History Chief Complaint  Patient presents with  . Generalized Fatigue    Carol Welch is a 45 y.o. female with history of hypothyroidism presenting emergency department abdominal pain.  She reports onset of symptoms approximately 4 days ago.  She said that she ate pork rinds which had been left out at room temperature for too long.  Within a few hours of eating these, she began to feel nauseated and has had profuse diarrhea.  Her husband also ate the same thing and had diarrhea for a day.  She reports that the diarrhea and the nausea improved 24 hours afterwards, but she began having myalgias, weakness, headache, chills.  She has continued to have the symptoms for the past 2 days.  She was seen by her PCP yesterday and told she may have a viral gastroenteritis.  She presents to the ED because she continues to have significant fatigue and muscle weakness.  She also describes some pressure in the middle of her chest.  She reports poor appetite and abdominal bloating.  She is no longer having diarrhea, she is passing gas.  She denies dysuria or hematuria.  She reports she has had 2 doses of the COVID-vaccine.  She reports her only abdominal surgery was a hysterectomy.  She denies any history of bowel obstruction or diverticulitis that she is aware of.  She reports a several years ago she was hospitalized for "to have my heart looked out".  She said she had a stress test which was unremarkable.  She says otherwise she does not have any known cardiac history, does not smoke, has no history of hypertension or hyperlipidemia.  HPI     Past Medical History:  Diagnosis Date  . Anal fissure   . Colitis   . Depression   . Hypothyroidism   . PONV (postoperative nausea and vomiting)   . SVD (spontaneous vaginal delivery) 06/30/2015    Patient Active Problem List   Diagnosis Date Noted   . Gastroenteritis 10/03/2020  . Hand trauma, right, initial encounter 07/18/2020  . Encounter for surveillance of abnormal nevi 07/09/2020  . Acquired hypothyroidism 07/07/2020  . 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 Neck pain 01/18/2020  . S/P laparoscopic assisted vaginal hysterectomy (LAVH) 12/30/2019  . External thrombosed hemorrhoids 09/11/2019  . MDD (major depressive disorder) 08/24/2019  . Orthostatic hypotension 08/24/2019  . Hypothyroidism 07/06/2018  . Family history of congenital anomaly 08/27/2016    Past Surgical History:  Procedure Laterality Date  . BREAST CYST ASPIRATION Right 02/13/2018  . CYSTOSCOPY N/A 12/30/2019   Procedure: CYSTOSCOPY;  Surgeon: Sherian Rein, MD;  Location: [facility];  Service: Gynecology;  Laterality: N/A;  . ENDOVENOUS ABLATION SAPHENOUS VEIN W/ LASER Right 10/08/2018   endovenous laser ablation right saphenous vein by Waverly Ferrari MD  . LAPAROSCOPIC VAGINAL HYSTERECTOMY WITH SALPINGECTOMY Bilateral 12/30/2019   Procedure: LAPAROSCOPIC ASSISTED VAGINAL HYSTERECTOMY WITH SALPINGECTOMY;  Surgeon: Sherian Rein, MD;  Location: [facility];  Service: Gynecology;  Laterality: Bilateral;     OB History    Gravida  6   Para  5   Term  5   Preterm  0   AB  1   Living  5     SAB  1   IAB  0   Ectopic  0   Multiple  0   Live Births  2  Family History  Problem Relation Age of Onset  . Heart disease Father   . Breast cancer Maternal Aunt   . Breast cancer Maternal Grandmother   . Heart disease Paternal Grandfather   . Breast cancer Maternal Aunt   . Breast cancer Cousin        maternal  . Colon cancer Neg Hx   . Esophageal cancer Neg Hx   . Rectal cancer Neg Hx   . Stomach cancer Neg Hx     Social History   Tobacco Use  . Smoking status: Never Smoker  . Smokeless tobacco: Never Used  Vaping Use  . Vaping Use: Never used   Substance Use Topics  . Alcohol use: Yes    Comment: once a year  . Drug use: Never    Home Medications Prior to Admission medications   Medication Sig Start Date End Date Taking? Authorizing Provider  Cyanocobalamin (B-12 PO) Take 1 tablet by mouth daily.   Yes [provider]  dicyclomine (BENTYL) 20 MG tablet Take 1 tablet (20 mg total) by mouth 2 (two) times daily as needed for up to 20 days for spasms. 10/04/20 10/24/20 Yes Zan Triska, Kermit BaloMatthew J, MD  ibuprofen (ADVIL) 200 MG tablet Take 200 mg by mouth every 6 (six) hours as needed for mild pain.   Yes [provider]  levothyroxine (SYNTHROID) 88 MCG tablet Take 1 tablet (88 mcg total) by mouth daily. 08/10/20  Yes Shamleffer, Konrad DoloresIbtehal Jaralla, MD  ondansetron (ZOFRAN ODT) 4 MG disintegrating tablet Take 1 tablet (4 mg total) by mouth every 8 (eight) hours as needed for up to 15 days for nausea or vomiting. 10/04/20 10/19/20 Yes Keny Donald, Kermit BaloMatthew J, MD  acetaminophen (TYLENOL) 500 MG tablet Take 1,000 mg by mouth every 6 (six) hours as needed for mild pain, moderate pain, fever or headache.     [provider]  busPIRone (BUSPAR) 15 MG tablet Take 1 tablet (15 mg total) by mouth 2 (two) times daily. 07/04/20   Dollene ClevelandAnderson, Hannah C, DO  cholecalciferol (VITAMIN D3) 25 MCG (1000 UNIT) tablet Take 1,000 Units by mouth daily.    [provider]  escitalopram (LEXAPRO) 20 MG tablet TAKE 1 AND 1/2 TABLETS(30 MG) BY MOUTH DAILY 07/04/20   Dollene ClevelandAnderson, Hannah C, DO  Krill Oil (OMEGA-3) 500 MG CAPS Take 500 mg by mouth daily.    [provider]  LESSINA-28 0.1-20 MG-MCG tablet Take 1 tablet by mouth daily. 03/24/20   [provider]  magnesium 30 MG tablet Take 30 mg by mouth daily.    [provider]  Multiple Vitamins-Minerals (MULTIVITAMIN WITH MINERALS) tablet Take 1 tablet by mouth daily.    [provider]  Probiotic Product (PROBIOTIC PO) Take 1 capsule by mouth daily.    [provider]  Psyllium (FIBER) 28.3 % POWD Take 1 Package by mouth 2 (two) times daily. 03/17/20   Anderson, Chelsey L, DO  senna (SENOKOT) 8.6 MG TABS tablet Take 1 tablet (8.6 mg total) by mouth daily. 07/04/20   Dollene ClevelandAnderson, Hannah C, DO    Allergies    Patient has no known allergies.  Review of Systems   Review of Systems  Constitutional: Negative for chills and fever.  HENT: Negative for ear pain and sore throat.   Respiratory: Negative for cough and shortness of breath.   Cardiovascular: Positive for chest pain. Negative for palpitations.  Gastrointestinal: Positive for abdominal distention, abdominal pain, diarrhea and nausea. Negative for vomiting.  Genitourinary: Negative  for dysuria and hematuria.  Musculoskeletal: Positive for arthralgias, back pain and myalgias.  Skin: Negative for color change and rash.  Neurological: Positive for headaches. Negative for syncope.  All other systems reviewed and are negative.   Physical Exam Updated Vital Signs BP 114/74 (BP Location: Right Arm)   Pulse (!) 59   Temp 98.9 F (37.2 C) (Oral)   Resp 19   Ht 5\' 2"  (1.575 m)   Wt 78 kg   LMP 12/19/2019   SpO2 100%   BMI 31.46 kg/m   Physical Exam Constitutional:      General: She is not in acute distress. HENT:     Head: Normocephalic and atraumatic.  Eyes:     Conjunctiva/sclera: Conjunctivae normal.     Pupils: Pupils are equal, round, and reactive to light.  Cardiovascular:     Rate and Rhythm: Normal rate and regular rhythm.  Pulmonary:     Effort: Pulmonary effort is normal. No respiratory distress.  Abdominal:     General: There is no distension.     Tenderness: There is generalized abdominal tenderness. There is no guarding or rebound.     Comments: Mild abdominal distension  Skin:    General: Skin is warm and dry.  Neurological:     General: No focal deficit present.     Mental Status: She is alert. Mental status is at baseline.  Psychiatric:        Mood and  Affect: Mood normal.        Behavior: Behavior normal.     ED Results / Procedures / Treatments   Labs (all labs ordered are listed, but only abnormal results are displayed) Labs Reviewed  COMPREHENSIVE METABOLIC PANEL - Abnormal; Notable for the following components:      Result Value   Total Protein 6.4 (*)    Alkaline Phosphatase 37 (*)    All other components within normal limits  RESP PANEL BY RT-PCR (FLU A&B, COVID) ARPGX2  CBC WITH DIFFERENTIAL/PLATELET  LIPASE, BLOOD  TROPONIN I (HIGH SENSITIVITY)  TROPONIN I (HIGH SENSITIVITY)    EKG None  Radiology No results found.  Procedures Procedures   Medications Ordered in ED Medications - No data to display  ED Course  I have reviewed the triage vital signs and the nursing notes.  Pertinent labs & imaging results that were available during my care of the patient were reviewed by me and considered in my medical decision making (see chart for details).  This patient presents to the Emergency Department with complaint of abdominal pain. This involves an extensive number of treatment options, and is a complaint that carries with it a high risk of complications and morbidity.  The differential diagnosis includes, but is not limited to, viral gastro-enteritis vs biliary disease vs peptic ulcer vs constipation vs colitis vs other  This is most likely the sequelae of a viral gastroenteritis or foodborne illness from what she ate 4 days ago.  She has normal vital signs do not appear septic to me.  She has a small amount of abdominal distention which we expect with bloating and gas after a viral illness.  She is passing gas, and I do not think she has a bowel obstruction at this time.  Although she is overall low risk for heart disease, it is unclear what her prior cardiac history was.  As she is describing chest pressure, I think it is reasonable obtain an EKG and a troponin her as well.  I ordered,  reviewed, and interpreted labs,  including CMP and CBC and lipase.  There were no immediate, life-threatening emergencies found in this labwork.   Trop <2.    I personally reviewed the patients ECG  which showed sinus rhythm with no acute ischemic findings  After the interventions stated above, I reevaluated the patient and found that they remained clinically stable.  Based on the patient's clinical exam, vital signs, risk factors, and ED testing, I felt that the patient's overall risk of life-threatening emergency such as bowel perforation, surgical emergency, or sepsis was quite low.  I suspect this clinical presentation is most consistent with viral or foodborne illness, but explained to the patient that this evaluation was not a definitive diagnostic workup.  I discussed outpatient follow up with primary care provider, and provided specialist office number on the patient's discharge paper if a referral was deemed necessary.  I discussed return precautions with the patient. I felt the patient was clinically stable for discharge.      Final Clinical Impression(s) / ED Diagnoses Final diagnoses:  Viral gastroenteritis    Rx / DC Orders ED Discharge Orders         Ordered    dicyclomine (BENTYL) 20 MG tablet  2 times daily PRN        10/04/20 1711    ondansetron (ZOFRAN ODT) 4 MG disintegrating tablet  Every 8 hours PRN        10/04/20 1711           Terald Sleeper, MD 10/04/20 2339

## 2020-10-04 NOTE — ED Notes (Signed)
Patient verbalizes understanding of discharge instructions. Opportunity for questioning and answers were provided. Armband removed by staff, pt discharged from ED.  

## 2020-10-04 NOTE — ED Triage Notes (Addendum)
Vomiting and diarrhea past Saturday and Sunday- resolved on Monday.  Came today, generalized muscle .  Seen by her PCP yesterday with no orders. Came today for generalized muscle tightness and pain and fatigue.

## 2020-10-09 DIAGNOSIS — F331 Major depressive disorder, recurrent, moderate: Secondary | ICD-10-CM | POA: Diagnosis not present

## 2020-10-14 DIAGNOSIS — F331 Major depressive disorder, recurrent, moderate: Secondary | ICD-10-CM | POA: Diagnosis not present

## 2020-10-21 DIAGNOSIS — F331 Major depressive disorder, recurrent, moderate: Secondary | ICD-10-CM | POA: Diagnosis not present

## 2020-10-26 ENCOUNTER — Other Ambulatory Visit: Payer: Self-pay

## 2020-10-26 ENCOUNTER — Ambulatory Visit
Admission: RE | Admit: 2020-10-26 | Discharge: 2020-10-26 | Disposition: A | Discharge status: Home / Self Care | Payer: Medicaid Other | Source: Ambulatory Visit | Attending: Obstetrics and Gynecology | Admitting: Obstetrics and Gynecology

## 2020-10-26 DIAGNOSIS — N644 Mastodynia: Secondary | ICD-10-CM

## 2020-11-01 ENCOUNTER — Ambulatory Visit: Payer: Medicaid Other | Admitting: Internal Medicine

## 2020-11-03 ENCOUNTER — Other Ambulatory Visit: Payer: Medicaid Other

## 2020-11-03 DIAGNOSIS — Z419 Encounter for procedure for purposes other than remedying health state, unspecified: Secondary | ICD-10-CM | POA: Diagnosis not present

## 2020-11-09 ENCOUNTER — Ambulatory Visit: Payer: Medicaid Other | Admitting: Podiatry

## 2020-12-02 DIAGNOSIS — F331 Major depressive disorder, recurrent, moderate: Secondary | ICD-10-CM | POA: Diagnosis not present

## 2020-12-04 DIAGNOSIS — Z419 Encounter for procedure for purposes other than remedying health state, unspecified: Secondary | ICD-10-CM | POA: Diagnosis not present

## 2020-12-05 ENCOUNTER — Encounter: Payer: Self-pay | Admitting: Internal Medicine

## 2020-12-05 ENCOUNTER — Ambulatory Visit (INDEPENDENT_AMBULATORY_CARE_PROVIDER_SITE_OTHER): Payer: Medicaid Other | Admitting: Internal Medicine

## 2020-12-05 ENCOUNTER — Other Ambulatory Visit: Payer: Self-pay

## 2020-12-05 VITALS — BP 112/80 | HR 70 | Ht 62.0 in | Wt 171.0 lb

## 2020-12-05 DIAGNOSIS — E039 Hypothyroidism, unspecified: Secondary | ICD-10-CM | POA: Diagnosis not present

## 2020-12-05 DIAGNOSIS — E559 Vitamin D deficiency, unspecified: Secondary | ICD-10-CM | POA: Diagnosis not present

## 2020-12-05 LAB — TSH: TSH: 0.13 u[IU]/mL — ABNORMAL LOW (ref 0.35–5.50)

## 2020-12-05 LAB — VITAMIN D 25 HYDROXY (VIT D DEFICIENCY, FRACTURES): VITD: 41.65 ng/mL (ref 30.00–100.00)

## 2020-12-05 NOTE — Progress Notes (Signed)
Name: Carol Welch  MRN/ DOB: [id number], August 18, 1975    Age/ Sex: 45 y.o., female     PCP: Shirlean Mylar, MD   Reason for Endocrinology Evaluation: Hypothyroidism     Initial Endocrinology Clinic Visit: 07/07/2020    PATIENT IDENTIFIER: Carol Welch is a 45 y.o., female with a past medical history of Hypothyroidism. She has followed with [facility] since 07/07/2020 for consultative assistance with management of her Hypothyroidism.   HISTORICAL SUMMARY:   Pt has been diagnosed with hypothyroidism since her teenage years. Has been on LT-4 replacement since then.  Paternal aunt with thyroid disease   SUBJECTIVE:    Today (12/05/2020):  Carol Welch is here for hypothyroidism.   Weight has been stable  She has been taking it appropriately  Held collagen with Biotin   She has constipation  Denies palpitations  Has occasional tremors ans anxiety  Continue with fatigue  Has occasional sleep issues   Has hx of vitamin D deficiency, was on Vitamin D temporarily but has been out of over a month   Has globus sensation  No heartburn    Levothyroxine 88 mcg daily   HISTORY:  Past Medical History:  Past Medical History:  Diagnosis Date   Anal fissure    Colitis    Depression    Hypothyroidism    PONV (postoperative nausea and vomiting)    SVD (spontaneous vaginal delivery) 06/30/2015   Past Surgical History:  Past Surgical History:  Procedure Laterality Date   BREAST CYST ASPIRATION Right 02/13/2018   CYSTOSCOPY N/A 12/30/2019   Procedure: CYSTOSCOPY;  Surgeon: Sherian Rein, MD;  Location: [facility];  Service: Gynecology;  Laterality: N/A;   ENDOVENOUS ABLATION SAPHENOUS VEIN W/ LASER Right 10/08/2018   endovenous laser ablation right saphenous vein by Waverly Ferrari MD   LAPAROSCOPIC VAGINAL HYSTERECTOMY WITH SALPINGECTOMY Bilateral 12/30/2019   Procedure: LAPAROSCOPIC ASSISTED VAGINAL HYSTERECTOMY WITH SALPINGECTOMY;   Surgeon: Sherian Rein, MD;  Location: [facility];  Service: Gynecology;  Laterality: Bilateral;   Social History:  reports that she has never smoked. She has never used smokeless tobacco. She reports current alcohol use. She reports that she does not use drugs. Family History:  Family History  Problem Relation Age of Onset   Heart disease Father    Breast cancer Maternal Aunt    Breast cancer Maternal Grandmother    Heart disease Paternal Grandfather    Breast cancer Maternal Aunt    Breast cancer Cousin        maternal   Colon cancer Neg Hx    Esophageal cancer Neg Hx    Rectal cancer Neg Hx    Stomach cancer Neg Hx      HOME MEDICATIONS: Allergies as of 12/05/2020   No Known Allergies      Medication List        Accurate as of December 05, 2020  7:29 AM. If you have any questions, ask your nurse or doctor.          acetaminophen 500 MG tablet Commonly known as: TYLENOL Take 1,000 mg by mouth every 6 (six) hours as needed for mild pain, moderate pain, fever or headache.   B-12 PO Take 1 tablet by mouth daily.   busPIRone 15 MG tablet Commonly known as: BUSPAR Take 1 tablet (15 mg total) by mouth 2 (two) times daily.   cholecalciferol 25 MCG (1000 UNIT) tablet Commonly known as: VITAMIN D3 Take 1,000 Units by mouth daily.  dicyclomine 20 MG tablet Commonly known as: BENTYL Take 1 tablet (20 mg total) by mouth 2 (two) times daily as needed for up to 20 days for spasms.   escitalopram 20 MG tablet Commonly known as: LEXAPRO TAKE 1 AND 1/2 TABLETS(30 MG) BY MOUTH DAILY   Fiber 28.3 % Powd Take 1 Package by mouth 2 (two) times daily.   ibuprofen 200 MG tablet Commonly known as: ADVIL Take 200 mg by mouth every 6 (six) hours as needed for mild pain.   LESSINA-28 0.1-20 MG-MCG tablet Generic drug: levonorgestrel-ethinyl estradiol Take 1 tablet by mouth daily.   levothyroxine 88 MCG tablet Commonly known as: SYNTHROID Take 1 tablet  (88 mcg total) by mouth daily.   magnesium 30 MG tablet Take 30 mg by mouth daily.   multivitamin with minerals tablet Take 1 tablet by mouth daily.   Omega-3 500 MG Caps Take 500 mg by mouth daily.   PROBIOTIC PO Take 1 capsule by mouth daily.   senna 8.6 MG Tabs tablet Commonly known as: SENOKOT Take 1 tablet (8.6 mg total) by mouth daily.          OBJECTIVE:   PHYSICAL EXAM: VS: BP 112/80 (BP Location: Left Arm, Patient Position: Sitting, Cuff Size: Normal)   Pulse 70   Ht 5\' 2"  (1.575 m)   Wt 171 lb (77.6 kg)   LMP 12/19/2019   SpO2 99%   BMI 31.28 kg/m    EXAM: General: Pt appears well and is in NAD  Neck: General: Supple without adenopathy. Thyroid: Thyroid size normal.  No goiter or nodules appreciated. No thyroid bruit.  Lungs: Clear with good BS bilat with no rales, rhonchi, or wheezes  Heart: Auscultation: RRR.  Abdomen: Normoactive bowel sounds, soft, nontender, without masses or organomegaly palpable  Extremities:  BL LE: No pretibial edema normal ROM and strength.  Mental Status: Judgment, insight: Intact Orientation: Oriented to time, place, and person Mood and affect: No depression, anxiety, or agitation     DATA REVIEWED:   Results for Carol, Welch (MRN Tyson Alias) as of 12/06/2020 14:59  Ref. Range 12/05/2020 11:46  VITD Latest Ref Range: 30.00 - 100.00 ng/mL 41.65  TSH Latest Ref Range: 0.35 - 5.50 uIU/mL 0.13 (L)    ASSESSMENT / PLAN / RECOMMENDATIONS:   Hypothyroidism   -The patient continues with low TSH despite decreasing LT-4 replacement -She assures me of holding her biotin, as it could cause similar picture -I am going to reduce levothyroxine dose as below, and recheck   Medications  -Stop levothyroxine 88 MCG -Start levothyroxine 75 MCG daily  2.  History of Vitamin D Deficiency :   -Repeat vitamin D is normal   Follow-up in 6 months Labs in 8 weeks   Signed electronically by: 02/04/2021,  MD  [facility]  [facility] [address] [place] [place], [place] Phone: [phone] FAX: [phone]      CC: [phone], MD [address] [place] Phone: [phone]  Fax: [phone]   Return to Endocrinology clinic as below: Future Appointments  Date Time Provider Department Center  12/05/2020 11:10 AM Elizeo Rodriques, 02/04/2021, MD [id number]

## 2020-12-05 NOTE — Patient Instructions (Signed)

## 2020-12-06 ENCOUNTER — Encounter: Payer: Self-pay | Admitting: Internal Medicine

## 2020-12-06 ENCOUNTER — Telehealth: Payer: Self-pay | Admitting: Internal Medicine

## 2020-12-06 MED ORDER — LEVOTHYROXINE SODIUM 75 MCG PO TABS: 75.0000 ug | ORAL_TABLET | Freq: Every day | ORAL | 3 refills | Status: DC

## 2020-12-06 NOTE — Telephone Encounter (Signed)
Can you please contact the patient and schedule her for a lab appointment only in early October?    Thank you

## 2020-12-20 DIAGNOSIS — F331 Major depressive disorder, recurrent, moderate: Secondary | ICD-10-CM | POA: Diagnosis not present

## 2020-12-23 DIAGNOSIS — F331 Major depressive disorder, recurrent, moderate: Secondary | ICD-10-CM | POA: Diagnosis not present

## 2020-12-27 DIAGNOSIS — Z202 Contact with and (suspected) exposure to infections with a predominantly sexual mode of transmission: Secondary | ICD-10-CM | POA: Diagnosis not present

## 2020-12-27 DIAGNOSIS — N898 Other specified noninflammatory disorders of vagina: Secondary | ICD-10-CM | POA: Diagnosis not present

## 2020-12-27 DIAGNOSIS — B373 Candidiasis of vulva and vagina: Secondary | ICD-10-CM | POA: Diagnosis not present

## 2020-12-30 DIAGNOSIS — F331 Major depressive disorder, recurrent, moderate: Secondary | ICD-10-CM | POA: Diagnosis not present

## 2021-01-04 DIAGNOSIS — Z419 Encounter for procedure for purposes other than remedying health state, unspecified: Secondary | ICD-10-CM | POA: Diagnosis not present

## 2021-01-06 DIAGNOSIS — F331 Major depressive disorder, recurrent, moderate: Secondary | ICD-10-CM | POA: Diagnosis not present

## 2021-01-13 DIAGNOSIS — F331 Major depressive disorder, recurrent, moderate: Secondary | ICD-10-CM | POA: Diagnosis not present

## 2021-01-20 DIAGNOSIS — F331 Major depressive disorder, recurrent, moderate: Secondary | ICD-10-CM | POA: Diagnosis not present

## 2021-01-27 DIAGNOSIS — F331 Major depressive disorder, recurrent, moderate: Secondary | ICD-10-CM | POA: Diagnosis not present

## 2021-02-03 DIAGNOSIS — Z419 Encounter for procedure for purposes other than remedying health state, unspecified: Secondary | ICD-10-CM | POA: Diagnosis not present

## 2021-02-07 ENCOUNTER — Other Ambulatory Visit: Payer: Medicaid Other

## 2021-02-07 DIAGNOSIS — F331 Major depressive disorder, recurrent, moderate: Secondary | ICD-10-CM | POA: Diagnosis not present

## 2021-02-12 ENCOUNTER — Other Ambulatory Visit: Payer: Self-pay | Admitting: Family Medicine

## 2021-02-12 DIAGNOSIS — F331 Major depressive disorder, recurrent, moderate: Secondary | ICD-10-CM

## 2021-02-28 ENCOUNTER — Other Ambulatory Visit: Payer: Self-pay | Admitting: Internal Medicine

## 2021-03-06 DIAGNOSIS — Z419 Encounter for procedure for purposes other than remedying health state, unspecified: Secondary | ICD-10-CM | POA: Diagnosis not present

## 2021-03-10 DIAGNOSIS — F331 Major depressive disorder, recurrent, moderate: Secondary | ICD-10-CM | POA: Diagnosis not present

## 2021-03-13 ENCOUNTER — Ambulatory Visit (INDEPENDENT_AMBULATORY_CARE_PROVIDER_SITE_OTHER): Payer: Medicaid Other | Admitting: Family Medicine

## 2021-03-13 ENCOUNTER — Encounter: Payer: Self-pay | Admitting: Family Medicine

## 2021-03-13 ENCOUNTER — Emergency Department (HOSPITAL_COMMUNITY)
Admission: EM | Admit: 2021-03-13 | Discharge: 2021-03-13 | Disposition: A | Discharge status: Home / Self Care | Payer: Medicaid Other | Attending: Emergency Medicine | Admitting: Emergency Medicine

## 2021-03-13 ENCOUNTER — Ambulatory Visit (HOSPITAL_COMMUNITY): Payer: Medicaid Other

## 2021-03-13 ENCOUNTER — Emergency Department (HOSPITAL_COMMUNITY): Payer: Medicaid Other

## 2021-03-13 ENCOUNTER — Other Ambulatory Visit: Payer: Self-pay

## 2021-03-13 ENCOUNTER — Ambulatory Visit (HOSPITAL_COMMUNITY)
Admission: RE | Admit: 2021-03-13 | Discharge: 2021-03-13 | Disposition: A | Discharge status: Home / Self Care | Payer: Medicaid Other | Source: Ambulatory Visit | Attending: Family Medicine | Admitting: Family Medicine

## 2021-03-13 VITALS — BP 121/80 | HR 76 | Wt 172.6 lb

## 2021-03-13 DIAGNOSIS — R0789 Other chest pain: Secondary | ICD-10-CM | POA: Diagnosis not present

## 2021-03-13 DIAGNOSIS — R52 Pain, unspecified: Secondary | ICD-10-CM

## 2021-03-13 DIAGNOSIS — R1013 Epigastric pain: Secondary | ICD-10-CM | POA: Diagnosis not present

## 2021-03-13 DIAGNOSIS — Z20822 Contact with and (suspected) exposure to covid-19: Secondary | ICD-10-CM | POA: Diagnosis not present

## 2021-03-13 DIAGNOSIS — Z79899 Other long term (current) drug therapy: Secondary | ICD-10-CM | POA: Insufficient documentation

## 2021-03-13 DIAGNOSIS — R42 Dizziness and giddiness: Secondary | ICD-10-CM | POA: Diagnosis not present

## 2021-03-13 DIAGNOSIS — R079 Chest pain, unspecified: Secondary | ICD-10-CM | POA: Insufficient documentation

## 2021-03-13 DIAGNOSIS — E039 Hypothyroidism, unspecified: Secondary | ICD-10-CM | POA: Insufficient documentation

## 2021-03-13 LAB — HEPATIC FUNCTION PANEL
ALT: 14 U/L (ref 0–44)
AST: 21 U/L (ref 15–41)
Albumin: 3.6 g/dL (ref 3.5–5.0)
Alkaline Phosphatase: 44 U/L (ref 38–126)
Bilirubin, Direct: <0.1 mg/dL (ref 0.0–0.2)
Total Bilirubin: 0.6 mg/dL (ref 0.3–1.2)
Total Protein: 6.8 g/dL (ref 6.5–8.1)

## 2021-03-13 LAB — CBC
HCT: 40.2 % (ref 36.0–46.0)
Hemoglobin: 13.3 g/dL (ref 12.0–15.0)
MCH: 29.4 pg (ref 26.0–34.0)
MCHC: 33.1 g/dL (ref 30.0–36.0)
MCV: 88.7 fL (ref 80.0–100.0)
Platelets: 255 10*3/uL (ref 150–400)
RBC: 4.53 MIL/uL (ref 3.87–5.11)
RDW: 14.2 % (ref 11.5–15.5)
WBC: 7.5 10*3/uL (ref 4.0–10.5)
nRBC: 0 % (ref 0.0–0.2)

## 2021-03-13 LAB — BASIC METABOLIC PANEL
Anion gap: 8 (ref 5–15)
BUN: 14 mg/dL (ref 6–20)
CO2: 24 mmol/L (ref 22–32)
Calcium: 9.2 mg/dL (ref 8.9–10.3)
Chloride: 104 mmol/L (ref 98–111)
Creatinine, Ser: 0.62 mg/dL (ref 0.44–1.00)
GFR, Estimated: >60 mL/min (ref >60)
Glucose, Bld: 103 mg/dL — ABNORMAL HIGH (ref 70–99)
Potassium: 4.2 mmol/L (ref 3.5–5.1)
Sodium: 136 mmol/L (ref 135–145)

## 2021-03-13 LAB — LIPASE, BLOOD: Lipase: 47 U/L (ref 11–51)

## 2021-03-13 LAB — TROPONIN I (HIGH SENSITIVITY)
Troponin I (High Sensitivity): <2 ng/L (ref <18)
Troponin I (High Sensitivity): <2 ng/L (ref <18)

## 2021-03-13 LAB — I-STAT BETA HCG BLOOD, ED (MC, WL, AP ONLY): I-stat hCG, quantitative: <5 m[IU]/mL (ref <5)

## 2021-03-13 LAB — TSH: TSH: 0.505 u[IU]/mL (ref 0.350–4.500)

## 2021-03-13 LAB — RESP PANEL BY RT-PCR (FLU A&B, COVID) ARPGX2
Influenza A by PCR: NEGATIVE
Influenza B by PCR: NEGATIVE
SARS Coronavirus 2 by RT PCR: NEGATIVE

## 2021-03-13 LAB — D-DIMER, QUANTITATIVE: D-Dimer, Quant: 0.41 ug/mL-FEU (ref 0.00–0.50)

## 2021-03-13 MED ORDER — ASPIRIN 325 MG PO TABS
325.0000 mg | ORAL_TABLET | Freq: Once | ORAL | Status: AC
  Administered 2021-03-13: 325 mg via ORAL

## 2021-03-13 MED ORDER — NITROGLYCERIN 0.4 MG SL SUBL
0.4000 mg | SUBLINGUAL_TABLET | Freq: Once | SUBLINGUAL | Status: AC
  Administered 2021-03-13: 0.4 mg via SUBLINGUAL

## 2021-03-13 NOTE — Discharge Instructions (Signed)
Your work-up here demonstrates that it is very unlikely that you are having a heart attack or a blood clot in the lung.  Your ultrasound of your gallbladder also is without concerning finding.  Please follow-up with your family doctor in the office.

## 2021-03-13 NOTE — Progress Notes (Signed)
  Date of Visit: 03/13/2021   SUBJECTIVE:   HPI:  Carol Welch presents today for a same day walk-in appointment to discuss chest pain and trouble breathing.  Woke up early this morning with feeling of indigestion in upper abdomen. Eventually this progressed to feeling pressure in the left side of her chest, which has persisted. Also feels some tightness in her throat area and discomfort in her back/L shoulder. Pain is worse with taking a deep breath and worse with ambulation. Denies cough or fever. Has felt tired and dizzy today. She took tylenol at about 6:30 this morning. Dizziness is also worse with ambulation.  Denies personal history of heart or lung problems. Does endorse significant cardiac family history - dad died of MI at age 46, paternal grandfather died of MI at age 57, and aunt has current heart/lung issues. Patient has never seen cardiology before.  OBJECTIVE:   BP 121/80   Pulse 76   Wt 172 lb 9.6 oz (78.3 kg)   LMP 12/19/2019   SpO2 100%   BMI 31.57 kg/m  Ambulated on room air with O2 sat 97% and above  Gen: no acute distress, cooperative, pleasant  HEENT: normocephalic, atraumatic. No anterior cervical or supraclavicular lymphadenopathy.  Heart: regular rate and rhythm, no murmur. Chest wall mildly tender to palpation. Lungs: clear to auscultation bilaterally, normal work of breathing  Neuro: alert, speech normal, grossly nonfocal Ext: No appreciable lower extremity edema bilaterally. No calf tenderness.  ASSESSMENT/PLAN:   Chest pain  Presents with acute onset of chest pain this AM. Has several features which are concerning for cardiac etiology (worse with exertion, accompanied by nausea, pleuritic, dizziness, radiation to throat/shoulder, accompanying shortness of breath). Also of concern is her family history - father died of MI at age 52. EKG performed today in clinic and is normal. Discussed with patient that we cannot rule out ACS with normal EKG alone, especially  in setting of ongoing symptoms. Some component of pain seems reproducible on exam, but not enough to reassure me against ACS.  Given nitroglycerin 0.4mg  SL with substantial improvement in chest pain - also raises concern for potential cardiac etiology. Also given aspirin 325mg  PO.  Recommended patient be evaluated in ED - needs at minimum serial troponins for ACS ruleout. Would also consider d-dimer or other method of ruling out PE given pleuritic nature of pain. Patient agreeable with ED evaluation. Gave option of EMS, staff-assisted transport to ED, or for her to go privately and patient preferred to go via private vehicle (her daughter is here and will drive her). Patient ambulated out of clinic independently (I accompanied her out of the front door of the clinic).  Called ED charge nurse and informed them of patient's pending arrival via private vehicle.  J. [place], MD [facility]

## 2021-03-13 NOTE — ED Provider Notes (Signed)
[facility] EMERGENCY DEPARTMENT Provider Note   CSN: [id number] Arrival date & time: 03/13/21  1100     History Chief Complaint  Patient presents with   Cough   Dizziness    Carol Welch is a 45 y.o. female.  Pt presents to the ED today with cp.  Pt said she woke up early this morning with sx.  Pt said she went to her doctor this morning.  PCP gave her asa and nitro which helped sx.  Pt was told to come here for further eval.  Pt has no hx of CAD.      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   Neck pain 01/18/2020   S/P laparoscopic assisted vaginal hysterectomy (LAVH) 12/30/2019   External thrombosed hemorrhoids 09/11/2019   MDD (major depressive disorder) 08/24/2019   Orthostatic hypotension 08/24/2019   Hypothyroidism 07/06/2018   Family history of congenital anomaly 08/27/2016    Past Surgical History:  Procedure Laterality Date   BREAST CYST ASPIRATION Right 02/13/2018   CYSTOSCOPY N/A 12/30/2019   Procedure: CYSTOSCOPY;  Surgeon: Janyth Contes, MD;  Location: [facility];  Service: Gynecology;  Laterality: N/A;   ENDOVENOUS ABLATION SAPHENOUS VEIN W/ LASER Right 10/08/2018   endovenous laser ablation right saphenous vein by Deitra Mayo MD   LAPAROSCOPIC VAGINAL HYSTERECTOMY WITH SALPINGECTOMY Bilateral 12/30/2019   Procedure: LAPAROSCOPIC ASSISTED VAGINAL HYSTERECTOMY WITH SALPINGECTOMY;  Surgeon: Janyth Contes, MD;  Location: [facility];  Service:  Gynecology;  Laterality: Bilateral;     OB History     Gravida  6   Para  5   Term  5   Preterm  0   AB  1   Living  5      SAB  1   IAB  0   Ectopic  0   Multiple  0   Live Births  2           Family History  Problem Relation Age of Onset   Heart disease Father    Breast cancer Maternal Aunt    Breast cancer Maternal Grandmother    Heart disease Paternal Grandfather    Breast cancer Maternal Aunt    Breast cancer Cousin        maternal   Colon cancer Neg Hx    Esophageal cancer Neg Hx    Rectal cancer Neg Hx    Stomach cancer Neg Hx     Social History   Tobacco Use   Smoking status: Never   Smokeless tobacco: Never  Vaping Use   Vaping Use: Never used  Substance Use Topics   Alcohol use: Yes    Comment: once a year   Drug use: Never    Home Medications Prior to Admission medications   Medication Sig Start Date End Date Taking? Authorizing Provider  acetaminophen (TYLENOL) 500 MG tablet Take 1,000 mg by mouth every 6 (six) hours as needed for mild pain, moderate pain, fever or headache.     [provider]  busPIRone (BUSPAR) 15  MG tablet Take 1 tablet (15 mg total) by mouth 2 (two) times daily. 07/04/20   Dollene Cleveland, DO  cholecalciferol (VITAMIN D3) 25 MCG (1000 UNIT) tablet Take 1,000 Units by mouth daily.    [provider]  Cyanocobalamin (B-12 PO) Take 1 tablet by mouth daily.    [provider]  escitalopram (LEXAPRO) 20 MG tablet TAKE 1 AND 1/2 TABLETS(30 MG) BY MOUTH DAILY 02/12/21   Autry-Lott, Randa Evens, DO  ibuprofen (ADVIL) 200 MG tablet Take 200 mg by mouth every 6 (six) hours as needed for mild pain.    [provider]  Boris Lown Oil (OMEGA-3) 500 MG CAPS Take 500 mg by mouth daily.    [provider]  levothyroxine (SYNTHROID) 75 MCG tablet Take 1 tablet (75 mcg total) by mouth daily. 12/06/20   Shamleffer, Konrad Dolores, MD  Multiple Vitamins-Minerals (MULTIVITAMIN WITH MINERALS)  tablet Take 1 tablet by mouth daily.    [provider]  Psyllium (FIBER) 28.3 % POWD Take 1 Package by mouth 2 (two) times daily. 03/17/20   Leeroy Bock, MD    Allergies    Patient has no known allergies.  Review of Systems   Review of Systems  Cardiovascular:  Positive for chest pain.  All other systems reviewed and are negative.  Physical Exam Updated Vital Signs BP 104/67   Pulse 60   Temp 98.6 F (37 C) (Oral)   Resp 18   LMP 12/19/2019   SpO2 100%   Physical Exam Vitals and nursing note reviewed.  Constitutional:      Appearance: Normal appearance.  HENT:     Head: Normocephalic and atraumatic.     Right Ear: External ear normal.     Left Ear: External ear normal.     Nose: Nose normal.     Mouth/Throat:     Mouth: Mucous membranes are moist.     Pharynx: Oropharynx is clear.  Eyes:     Extraocular Movements: Extraocular movements intact.     Conjunctiva/sclera: Conjunctivae normal.     Pupils: Pupils are equal, round, and reactive to light.  Cardiovascular:     Rate and Rhythm: Normal rate and regular rhythm.     Pulses: Normal pulses.     Heart sounds: Normal heart sounds.  Pulmonary:     Effort: Pulmonary effort is normal.     Breath sounds: Normal breath sounds.  Abdominal:     General: Abdomen is flat. Bowel sounds are normal.  Musculoskeletal:        General: Normal range of motion.     Cervical back: Normal range of motion and neck supple.  Skin:    General: Skin is warm.     Capillary Refill: Capillary refill takes less than 2 seconds.  Neurological:     General: No focal deficit present.     Mental Status: She is alert and oriented to person, place, and time.  Psychiatric:        Mood and Affect: Mood normal.        Behavior: Behavior normal.    ED Results / Procedures / Treatments   Labs (all labs ordered are listed, but only abnormal results are displayed) Labs Reviewed  BASIC METABOLIC PANEL - Abnormal; Notable for  the following components:      Result Value   Glucose, Bld 103 (*)    All other components within normal limits  RESP PANEL BY RT-PCR (FLU A&B, COVID) ARPGX2  CBC  D-DIMER, QUANTITATIVE  HEPATIC FUNCTION PANEL  LIPASE, BLOOD  TSH  I-STAT BETA HCG BLOOD, ED ([facility], [facility], [facility] ONLY)  TROPONIN I (HIGH SENSITIVITY)  TROPONIN I (HIGH SENSITIVITY)    EKG EKG Interpretation  Date/Time:  Tuesday March 13 2021 11:18:36 EST Ventricular Rate:  68 PR Interval:  134 QRS Duration: 80 QT Interval:  406 QTC Calculation: 431 R Axis:   76 Text Interpretation: Normal sinus rhythm Normal ECG No significant change since last tracing Confirmed by Isla Pence [phone]) on 03/13/2021 1:19:07 PM  Radiology DG Chest 2 View  Result Date: 03/13/2021 CLINICAL DATA:  Chest pain and dizziness. EXAM: CHEST - 2 VIEW COMPARISON:  12/21/2013. FINDINGS: The lungs are clear without focal pneumonia, edema, pneumothorax or pleural effusion. The cardiopericardial silhouette is within normal limits for size. Nodular density/densities projecting over the lungs are compatible with pads for telemetry leads. The visualized bony structures of the thorax show no acute abnormality. IMPRESSION: No active cardiopulmonary disease. Electronically Signed   By: Misty Stanley M.D.   On: 03/13/2021 11:55    Procedures Procedures   Medications Ordered in ED Medications - No data to display  ED Course  I have reviewed the triage vital signs and the nursing notes.  Pertinent labs & imaging results that were available during my care of the patient were reviewed by me and considered in my medical decision making (see chart for details).    MDM Rules/Calculators/A&P                           Cardiac work up negative.  Heart score 1 for family hx.  2 negative troponins.  Negative ddimer.  GB US pending.  If that is ok, pt can go home.  Pt signed out to Dr. Tyrone Nine at shift change.  Final Clinical Impression(s) / ED Diagnoses Final  diagnoses:  Pain  Atypical chest pain    Rx / DC Orders ED Discharge Orders     None        Isla Pence, MD 03/13/21 1531

## 2021-03-13 NOTE — Patient Instructions (Signed)
Please go directly to the ER to have your chest pain evaluated. I will let them know you are coming.  Be well, Dr. Pollie Meyer

## 2021-03-13 NOTE — ED Provider Notes (Signed)
Emergency Medicine Provider Triage Evaluation Note  Carol Welch , a 45 y.o. female  was evaluated in triage.  Pt complains of chest pain, shortness of breath, nausea since this morning.  She went to go see her doctor earlier who treated her with aspirin and nitro and told her to come to the emergency department.  She states that this made her feel a little bit better.  Review of Systems  Positive: Chest pain, shortness of breath, nausea Negative:   Physical Exam  BP (!) 132/94 (BP Location: Right Arm)   Pulse 69   Temp 98.6 F (37 C) (Oral)   Resp 17   LMP 12/19/2019   SpO2 100%  Gen:   Awake, no distress   Resp:  Normal effort  MSK:   Moves extremities without difficulty  Other:  RRR, CTAB  Medical Decision Making  Medically screening exam initiated at 11:15 AM.  Appropriate orders placed.  Carol Welch was informed that the remainder of the evaluation will be completed by another provider, this initial triage assessment does not replace that evaluation, and the importance of remaining in the ED until their evaluation is complete.     Woodroe Chen 03/13/21 1117    Milagros Loll, MD 03/13/21 [phone]

## 2021-03-13 NOTE — ED Triage Notes (Signed)
Pt. Stated, I started having CP this morning around 500, stopped by my Dr. She gave me some Nitro under my tongue and feels better.

## 2021-03-17 DIAGNOSIS — F331 Major depressive disorder, recurrent, moderate: Secondary | ICD-10-CM | POA: Diagnosis not present

## 2021-03-20 ENCOUNTER — Ambulatory Visit (INDEPENDENT_AMBULATORY_CARE_PROVIDER_SITE_OTHER): Payer: Medicaid Other | Admitting: Family Medicine

## 2021-03-20 ENCOUNTER — Other Ambulatory Visit: Payer: Self-pay

## 2021-03-20 ENCOUNTER — Other Ambulatory Visit (INDEPENDENT_AMBULATORY_CARE_PROVIDER_SITE_OTHER): Payer: Medicaid Other

## 2021-03-20 VITALS — BP 106/62 | HR 66 | Ht 62.0 in | Wt 169.2 lb

## 2021-03-20 DIAGNOSIS — E039 Hypothyroidism, unspecified: Secondary | ICD-10-CM

## 2021-03-20 DIAGNOSIS — S46811A Strain of other muscles, fascia and tendons at shoulder and upper arm level, right arm, initial encounter: Secondary | ICD-10-CM

## 2021-03-20 DIAGNOSIS — Z87898 Personal history of other specified conditions: Secondary | ICD-10-CM | POA: Diagnosis not present

## 2021-03-20 LAB — TSH: TSH: 1.35 u[IU]/mL (ref 0.35–5.50)

## 2021-03-20 LAB — T4, FREE: Free T4: 0.94 ng/dL (ref 0.60–1.60)

## 2021-03-20 NOTE — Assessment & Plan Note (Signed)
-  given exam symptoms seem consistent with MSK etiology -handout provided -instructed to alternate between ice/heat along with routine stretching

## 2021-03-20 NOTE — Patient Instructions (Signed)
It was great seeing you today!  Today we discussed your neck pain, I think this is due to muscle strain. Alternating between ice and heat along with stretching can help as we discussed.   I am glad you are no longer having chest pain, please return to the ED if you start to experience chest pain again.   Please follow up at your next scheduled appointment, if anything arises between now and then, please don't hesitate to contact our office.   Thank you for allowing [place] to be a part of your medical care!  Thank you, Dr. Robyne Peers

## 2021-03-20 NOTE — Progress Notes (Signed)
    SUBJECTIVE:   Patient presents after recent ED visit on 11/8 when she was experiencing chest pain that radiated down her left arm along with occasional dyspnea. Symptom duration was about slightly over a day. Pain started when she was at rest. Denies history of reflux. Reports improved symptoms as she has no longer experienced chest pain and dyspnea since this episode. Denies any recent illness.   Endorsing right-sided neck pain that started within a day. Unsure if she slept funny on it. Denies any other changes. Denies any interference of activities of daily living. Denies any recent trauma or increased physical activity.   OBJECTIVE:   BP 106/62   Pulse 66   Ht 5\' 2"  (1.575 m)   Wt 169 lb 3.2 oz (76.7 kg)   LMP 12/19/2019   SpO2 97%   BMI 30.95 kg/m   General: Patient well-appearing, in no acute distress. CV: RRR, no murmurs or gallops auscultated Resp: CTAB Abdomen: soft, nontender, nondistended, presence of bowel sounds MSK: normal ROM of both shoulders, mild tenderness along right lateral C7 and trapezius muscle, tightness noted as well Derm: no rashes or lesions noted Ext: radial and distal pulses strong and equal bilaterally Psych: mood appropriate   ASSESSMENT/PLAN:   Trapezius muscle strain, right, initial encounter -given exam symptoms seem consistent with MSK etiology -handout provided -instructed to alternate between ice/heat along with routine stretching   Chest pain -resolved, thorough workup completed in the ED on 11/8, including negative troponins -return precautions given, if chest pain restarts, instructed to go to ED for further evaluation     13/8, DO [facility]

## 2021-04-05 DIAGNOSIS — Z419 Encounter for procedure for purposes other than remedying health state, unspecified: Secondary | ICD-10-CM | POA: Diagnosis not present

## 2021-04-15 ENCOUNTER — Other Ambulatory Visit: Payer: Self-pay | Admitting: Internal Medicine

## 2021-04-21 DIAGNOSIS — F331 Major depressive disorder, recurrent, moderate: Secondary | ICD-10-CM | POA: Diagnosis not present

## 2021-05-06 DIAGNOSIS — Z419 Encounter for procedure for purposes other than remedying health state, unspecified: Secondary | ICD-10-CM | POA: Diagnosis not present

## 2021-05-23 DIAGNOSIS — L292 Pruritus vulvae: Secondary | ICD-10-CM | POA: Diagnosis not present

## 2021-05-23 DIAGNOSIS — Z202 Contact with and (suspected) exposure to infections with a predominantly sexual mode of transmission: Secondary | ICD-10-CM | POA: Diagnosis not present

## 2021-05-28 NOTE — Progress Notes (Signed)
° ° °  SUBJECTIVE:   CHIEF COMPLAINT / HPI:   Blood in bowel movements   Abdominal discomfort   nausea/emesis: 46 year old female presenting for the above.  She states she had this happen once before about 2 years ago and underwent colonoscopy which she states was fine. She has a history of external hemorrhoids and internal hemorrhoids. Reviewed report from previous colonoscopy in 2022 with internal hemorrhoids. She had a bowel movement 2-3x per day and denies feeling the need to strain. She has seen bright red blood with her stool during each bowel movement the past 3 days. She has also had RUQ abdominal discomfort and generalized abdominal cramping for 3-4 days. Over the last month she has noticed she had to limit her meals or she feels nauseated. She denies reflux symptoms or fevers. Heavier foods worsen the abdominal discomfort.   PERTINENT  PMH / PSH: History of internal hemorrhoids  OBJECTIVE:   BP 103/65    Pulse 62    Ht 5\' 2"  (1.575 m)    Wt 176 lb (79.8 kg)    LMP 12/19/2019    SpO2 100%    BMI 32.19 kg/m    General: NAD, pleasant, able to participate in exam Cardiac: RRR, no murmurs. Respiratory: CTAB, normal effort, No wheezes, rales or rhonchi Abdomen: Bowel sounds present, she does have some generalized discomfort to palpation with no acute surgical findings, she has a pseudo-Murphy sign when palpating in the right upper quadrant when she takes a deep breath after exhaling, mild left lower quadrant discomfort. Neuro: alert, no obvious focal deficits Psych: Normal affect and mood  ASSESSMENT/PLAN:   Blood in stool: Likely secondary to internal hemorrhoids as the patient had a colonoscopy about 11 months ago which only showed those and was otherwise normal.  She is having 2-3 bowel movements per day which she states are of "normal consistency".  Discussed with her that sometimes using MiraLAX even in that setting to soften the bowel movements can help.  She has been having blood in  her stool for about 3-4 days.  With a normal colonoscopy I have low concern for malignant etiology as the cause of this.  We will check CBC and hepatic panel due to her abdominal discomfort/right upper quadrant discomfort (see problem below).  Abdominal discomfort, RUQ discomfort   nausea/emesis: Abdominal discomfort has been going on 3-4 days, however she states that when eating fatty foods over the past several weeks or months she gets nauseated and sometimes has emesis.  She does have pain with palpation of the right upper quadrant particular when palpating under the rib cage on the right.  She has generalized abdominal discomfort elsewhere with no acute surgical findings.  We will check a CBC and hepatic function panel as well as right upper quadrant ultrasound to evaluate for gallbladder etiology.  Differential for abdominal discomfort is broad and can include gastroenteritis, food intolerance, gallbladder etiology, infectious etiology.  Less concern from an infectious standpoint as the nausea and emesis has been going on for several weeks and is increases my concern for gallbladder cause.  Unlikely chronic inflammatory bowel disease with normal colonoscopy 11 months ago, however other forms of colitis are still possible.   Lurline Del, [place]

## 2021-05-29 ENCOUNTER — Other Ambulatory Visit: Payer: Self-pay

## 2021-05-29 ENCOUNTER — Encounter: Payer: Self-pay | Admitting: Family Medicine

## 2021-05-29 ENCOUNTER — Ambulatory Visit (INDEPENDENT_AMBULATORY_CARE_PROVIDER_SITE_OTHER): Payer: Medicaid Other | Admitting: Family Medicine

## 2021-05-29 VITALS — BP 103/65 | HR 62 | Ht 62.0 in | Wt 176.0 lb

## 2021-05-29 DIAGNOSIS — R1011 Right upper quadrant pain: Secondary | ICD-10-CM | POA: Diagnosis not present

## 2021-05-29 DIAGNOSIS — K921 Melena: Secondary | ICD-10-CM

## 2021-05-29 NOTE — Patient Instructions (Signed)
We are going to check your blood work today including looking at your liver enzymes and your blood counts.  Additionally I am sending you for an ultrasound to evaluate your gallbladder.  They will provide you with the schedule for this before you leave today.  I will reach out to you with the results when they return.  If you would like we could discuss the results in person next week if I have any availability that matches her schedule.  In the meantime you could consider using a daily dose of MiraLAX 17 g to help soften the bowel movements as this may help with the bleeding.  I expect that the bleeding is due to internal hemorrhoids and do not think we need to do any specific additional testing with regard to this other than checking your blood counts.  I expect this will resolve in the next few days.  If it does not please let me know.  If the bleeding worsens or if you put out frank blood that does not seem to be stopping you should follow-up with [place] or go to the emergency department.

## 2021-05-30 ENCOUNTER — Ambulatory Visit
Admission: RE | Admit: 2021-05-30 | Discharge: 2021-05-30 | Disposition: A | Discharge status: Home / Self Care | Payer: Medicaid Other | Source: Ambulatory Visit | Attending: Family Medicine | Admitting: Family Medicine

## 2021-05-30 DIAGNOSIS — R1011 Right upper quadrant pain: Secondary | ICD-10-CM | POA: Diagnosis not present

## 2021-05-30 LAB — CBC WITH DIFFERENTIAL/PLATELET
Basophils Absolute: 0.1 10*3/uL (ref 0.0–0.2)
Basos: 1 %
EOS (ABSOLUTE): 0.1 10*3/uL (ref 0.0–0.4)
Eos: 2 %
Hematocrit: 38.9 % (ref 34.0–46.6)
Hemoglobin: 13.3 g/dL (ref 11.1–15.9)
Immature Grans (Abs): 0 10*3/uL (ref 0.0–0.1)
Immature Granulocytes: 0 %
Lymphocytes Absolute: 2.6 10*3/uL (ref 0.7–3.1)
Lymphs: 44 %
MCH: 30.2 pg (ref 26.6–33.0)
MCHC: 34.2 g/dL (ref 31.5–35.7)
MCV: 88 fL (ref 79–97)
Monocytes Absolute: 0.4 10*3/uL (ref 0.1–0.9)
Monocytes: 7 %
Neutrophils Absolute: 2.8 10*3/uL (ref 1.4–7.0)
Neutrophils: 46 %
Platelets: 250 10*3/uL (ref 150–450)
RBC: 4.4 x10E6/uL (ref 3.77–5.28)
RDW: 12.9 % (ref 11.7–15.4)
WBC: 6 10*3/uL (ref 3.4–10.8)

## 2021-05-30 LAB — HEPATIC FUNCTION PANEL
ALT: 10 IU/L (ref 0–32)
AST: 15 IU/L (ref 0–40)
Albumin: 4 g/dL (ref 3.8–4.8)
Alkaline Phosphatase: 48 IU/L (ref 44–121)
Bilirubin Total: 0.2 mg/dL (ref 0.0–1.2)
Bilirubin, Direct: <0.1 mg/dL (ref 0.00–0.40)
Total Protein: 6.9 g/dL (ref 6.0–8.5)

## 2021-06-05 NOTE — Progress Notes (Signed)
° ° °  SUBJECTIVE:   CHIEF COMPLAINT / HPI:   Follow-up-blood in bowel movements, abdominal pain, nausea/emesis: Patient presents for follow-up after being seen by myself on 05/29/2021 for the above.  She had recently had a colonoscopy about 1-2 years ago which only showed internal hemorrhoids.  We performed lab work including a CBC and hepatic panel for her abdominal discomfort and blood in stool, also ordered right upper quadrant ultrasound.  Lab work was within normal limits with no anemia.  Right upper quadrant ultrasound was negative for gallstones and just showed heterogeneous echotexture of the liver suggestive of hepatic steatosis.  Today she states she still has some RUQ pain. It seems to come and go and doesn't seem to be affected by meals.  PERTINENT  PMH / PSH: External and internal hemorrhoids  OBJECTIVE:   BP 94/72    Pulse (!) 57    Ht 5\' 2"  (1.575 m)    Wt 171 lb 4 oz (77.7 kg)    LMP 12/19/2019    SpO2 100%    BMI 31.32 kg/m    General: NAD, pleasant, able to participate in exam Cardiac: RRR, no murmurs. Respiratory: CTAB, normal effort, No wheezes, rales or rhonchi Abdomen: Mild abdominal discomfort to palpation diffusely with no acute surgical findings, she does have the worst of the abdominal discomfort in the periumbilical and right upper quadrant region, when pressing on her lower back and in the muscles of her lower back in the same region she does get some abdominal discomfort in the front suggesting it may be a musculoskeletal etiology. Psych: Normal affect and mood  ASSESSMENT/PLAN:    Abdominal discomfort: No acute surgical signs on physical exam today.  She is previously had lab work which has been unremarkable as well as a right upper quadrant ultrasound which showed no obvious cause of her symptoms.  She has no rash today no signs of shingles as a cause.  There is a question if this is IBS or functional discomfort as she does note it being worse around times of  stress.  As we have ruled out most of the "bad causes" of her abdominal discomfort and she has a recent colonoscopy from 2 years ago think it is reasonable to keep a diary of her symptoms to see if they had any association with particular foods or times of stress.  I provided her IBS information as well as a IBS dietary guide.  Musculoskeletal causes also possible but I think less likely she experiences a fullness much deeper than muscular, however this should improve with some time.  Follow-up in about 4-8 weeks.  Discussed return precautions.   12/21/2019, DO [facility]

## 2021-06-06 ENCOUNTER — Ambulatory Visit (INDEPENDENT_AMBULATORY_CARE_PROVIDER_SITE_OTHER): Payer: Medicaid Other | Admitting: Family Medicine

## 2021-06-06 ENCOUNTER — Other Ambulatory Visit: Payer: Self-pay

## 2021-06-06 ENCOUNTER — Encounter: Payer: Self-pay | Admitting: Family Medicine

## 2021-06-06 VITALS — BP 94/72 | HR 57 | Ht 62.0 in | Wt 171.2 lb

## 2021-06-06 DIAGNOSIS — Z419 Encounter for procedure for purposes other than remedying health state, unspecified: Secondary | ICD-10-CM | POA: Diagnosis not present

## 2021-06-06 DIAGNOSIS — R1011 Right upper quadrant pain: Secondary | ICD-10-CM | POA: Diagnosis not present

## 2021-06-06 NOTE — Patient Instructions (Addendum)
I want you to keep a diary of your symptoms for the next 4 weeks and follow up with with me.  Try to see if you can find any association with particular foods or times of stress with your symptoms.  There is a possibility her symptoms are musculoskeletal and you can try heating pads or stretching to help with this, though I think this is less likely.  If you develop any worsening abdominal pain, diffuse bloody bowel movements, fevers, or other concerning symptoms please be seen sooner or go to the ER.

## 2021-06-08 DIAGNOSIS — F331 Major depressive disorder, recurrent, moderate: Secondary | ICD-10-CM | POA: Diagnosis not present

## 2021-06-16 DIAGNOSIS — R300 Dysuria: Secondary | ICD-10-CM | POA: Diagnosis not present

## 2021-06-16 DIAGNOSIS — B3731 Acute candidiasis of vulva and vagina: Secondary | ICD-10-CM | POA: Diagnosis not present

## 2021-06-16 DIAGNOSIS — N760 Acute vaginitis: Secondary | ICD-10-CM | POA: Diagnosis not present

## 2021-06-16 DIAGNOSIS — N898 Other specified noninflammatory disorders of vagina: Secondary | ICD-10-CM | POA: Diagnosis not present

## 2021-06-22 DIAGNOSIS — F331 Major depressive disorder, recurrent, moderate: Secondary | ICD-10-CM | POA: Diagnosis not present

## 2021-06-29 DIAGNOSIS — N941 Unspecified dyspareunia: Secondary | ICD-10-CM | POA: Diagnosis not present

## 2021-06-29 DIAGNOSIS — R350 Frequency of micturition: Secondary | ICD-10-CM | POA: Diagnosis not present

## 2021-06-29 DIAGNOSIS — N898 Other specified noninflammatory disorders of vagina: Secondary | ICD-10-CM | POA: Diagnosis not present

## 2021-06-29 DIAGNOSIS — Z113 Encounter for screening for infections with a predominantly sexual mode of transmission: Secondary | ICD-10-CM | POA: Diagnosis not present

## 2021-06-29 DIAGNOSIS — Z202 Contact with and (suspected) exposure to infections with a predominantly sexual mode of transmission: Secondary | ICD-10-CM | POA: Diagnosis not present

## 2021-07-04 DIAGNOSIS — Z419 Encounter for procedure for purposes other than remedying health state, unspecified: Secondary | ICD-10-CM | POA: Diagnosis not present

## 2021-07-05 NOTE — Progress Notes (Signed)
? ? ?  SUBJECTIVE:  ? ?CHIEF COMPLAINT / HPI:  ? ?Abdominal pain: ?46 year old female present for follow-up after being seen by myself on 06/06/2021 for abdominal pain.  At that time it was noted that previous lab work as well as right upper quadrant ultrasound showed no obvious cause of her symptoms which were primarily isolated to the right upper quadrant of her abdomen.  There was a question of musculoskeletal cause versus IBS.  She had no signs or rash to suggest shingles.  She had a recent colonoscopy 2 years ago.  We ultimately discussed following an IBS dietary guide with follow-up approximately 4 weeks later, today, to see how her symptoms have improved.  Today she states she has stopped flour and some meats and noticed a big improvement in her symptoms. Pain is now about a 3/10 and located in the RUQ. Sometimes is crampy and sometimes sharp. She has bowel movement 2-3x per day.  ? ?Right glute pain: ?Stepped "funny" about 2 weeks ago.  Since then she has noticed some pain in the glute on her right side.  She denies any other trauma to it.  Notices some pain going down right leg on occasion.  She has tried some ibuprofen but nothing else yet..  ? ?PERTINENT  PMH / PSH: None relevant  ? ?OBJECTIVE:  ? ?BP 90/60   Pulse 73   Ht 5\' 2"  (1.575 m)   Wt 170 lb (77.1 kg)   LMP 12/19/2019   SpO2 98%   BMI 31.09 kg/m?   ? ?General: NAD, pleasant, able to participate in exam ?Cardiac: RRR, no murmurs. ?Respiratory: CTAB, normal effort, No wheezes, rales or rhonchi ?Abdomen: Bowel sounds present, soft, mild discomfort in the right upper and right lower quadrant this seems improved from the last exam with her. ?MSK: No pain with palpation of the midline spine, patient does have pain in an isolated area consistent with the right piriformis.  When doing a straight leg raise testing on the right she has some minor discomfort in the back of the leg, none on the left. ?Psych: Normal affect and mood ? ?ASSESSMENT/PLAN:   ? ?Right piriformis strain: ?Having about 2 weeks ago when she stepped in awkward fashion.  No other trauma or injury.  On physical exam has pain in the region of the piriformis with some occasional sciatic discomfort.  Demonstrated stretching modalities she can use at home and recommended her stretch it at home 2-3 times per day for the next 1 to 2 weeks.  Also recommend continue with ibuprofen and Tylenol.  Recommend she could try Voltaren gel or lidocaine patches.  If she is not noticing a significant improvement in the next 1 to 2 weeks she is going let me know and I will send her a physical therapy referral.  I do not have see her again in order to do this per our discussion. ? ?Abdominal pain: ?This is improving since last appointment.  She is cut out several foods and seems toimprovement.  We have done multiple testing modalities which did not reveal any cause of her pain.  I discussed it is reasonable to keep a food diary and continue making changes to see what foods seem to cause her issues.  She is not having any significant constipation.  No red flag symptoms.  She is going to follow-up as needed with regard to this. ? ?12/21/2019, DO ?[facility]  ? ? ? ?

## 2021-07-06 ENCOUNTER — Ambulatory Visit (INDEPENDENT_AMBULATORY_CARE_PROVIDER_SITE_OTHER): Payer: Medicaid Other | Admitting: Family Medicine

## 2021-07-06 ENCOUNTER — Encounter: Payer: Self-pay | Admitting: Family Medicine

## 2021-07-06 ENCOUNTER — Other Ambulatory Visit: Payer: Self-pay

## 2021-07-06 VITALS — BP 90/60 | HR 73 | Ht 62.0 in | Wt 170.0 lb

## 2021-07-06 DIAGNOSIS — M7918 Myalgia, other site: Secondary | ICD-10-CM | POA: Diagnosis not present

## 2021-07-06 DIAGNOSIS — F331 Major depressive disorder, recurrent, moderate: Secondary | ICD-10-CM | POA: Diagnosis not present

## 2021-07-06 DIAGNOSIS — R109 Unspecified abdominal pain: Secondary | ICD-10-CM | POA: Diagnosis not present

## 2021-07-06 NOTE — Patient Instructions (Signed)
Domino pain I recommend continue to keep a diary of different foods that seem to cause you issues.  I do recommend reintroducing some of the foods that seem to cause you issues after 4 to 6 months to see if they continue to give you issues.  If you develop any worsening abdominal pain, fevers, or other concerning symptoms please follow-up.  Otherwise if it continues to improve you do not need to follow-up. ? ?I also believe you have a strain of your right piriformis muscle.  This muscle can cause some sciatic pains.  I recommend using Voltaren gel, lidocaine patches as well as ibuprofen and Tylenol.  Ultimately stretching it should give you the most benefit.  We showed you some stretches to do today.  I recommend using those 2-3 times per day.  If they do not improve your symptoms in the next 1 week or so let me know and I can get you in with physical therapy. ?

## 2021-07-19 ENCOUNTER — Encounter: Payer: Self-pay | Admitting: Family Medicine

## 2021-07-23 ENCOUNTER — Other Ambulatory Visit: Payer: Self-pay | Admitting: Family Medicine

## 2021-07-23 DIAGNOSIS — M7918 Myalgia, other site: Secondary | ICD-10-CM

## 2021-07-27 DIAGNOSIS — Z8639 Personal history of other endocrine, nutritional and metabolic disease: Secondary | ICD-10-CM | POA: Diagnosis not present

## 2021-07-27 DIAGNOSIS — N898 Other specified noninflammatory disorders of vagina: Secondary | ICD-10-CM | POA: Diagnosis not present

## 2021-07-27 DIAGNOSIS — Z113 Encounter for screening for infections with a predominantly sexual mode of transmission: Secondary | ICD-10-CM | POA: Diagnosis not present

## 2021-07-27 DIAGNOSIS — F520 Hypoactive sexual desire disorder: Secondary | ICD-10-CM | POA: Diagnosis not present

## 2021-07-27 DIAGNOSIS — R6882 Decreased libido: Secondary | ICD-10-CM | POA: Diagnosis not present

## 2021-07-27 DIAGNOSIS — N951 Menopausal and female climacteric states: Secondary | ICD-10-CM | POA: Diagnosis not present

## 2021-07-27 DIAGNOSIS — R102 Pelvic and perineal pain: Secondary | ICD-10-CM | POA: Diagnosis not present

## 2021-07-30 DIAGNOSIS — R102 Pelvic and perineal pain: Secondary | ICD-10-CM | POA: Diagnosis not present

## 2021-08-03 DIAGNOSIS — F331 Major depressive disorder, recurrent, moderate: Secondary | ICD-10-CM | POA: Diagnosis not present

## 2021-08-04 DIAGNOSIS — Z419 Encounter for procedure for purposes other than remedying health state, unspecified: Secondary | ICD-10-CM | POA: Diagnosis not present

## 2021-08-08 ENCOUNTER — Ambulatory Visit (INDEPENDENT_AMBULATORY_CARE_PROVIDER_SITE_OTHER): Payer: Medicaid Other | Admitting: Bariatrics

## 2021-08-08 ENCOUNTER — Encounter (INDEPENDENT_AMBULATORY_CARE_PROVIDER_SITE_OTHER): Payer: Self-pay | Admitting: Bariatrics

## 2021-08-08 VITALS — BP 92/60 | HR 67 | Temp 97.8°F | Ht 62.0 in | Wt 167.0 lb

## 2021-08-08 DIAGNOSIS — K760 Fatty (change of) liver, not elsewhere classified: Secondary | ICD-10-CM

## 2021-08-08 DIAGNOSIS — R5383 Other fatigue: Secondary | ICD-10-CM | POA: Diagnosis not present

## 2021-08-08 DIAGNOSIS — E559 Vitamin D deficiency, unspecified: Secondary | ICD-10-CM | POA: Diagnosis not present

## 2021-08-08 DIAGNOSIS — E538 Deficiency of other specified B group vitamins: Secondary | ICD-10-CM | POA: Diagnosis not present

## 2021-08-08 DIAGNOSIS — E669 Obesity, unspecified: Secondary | ICD-10-CM

## 2021-08-08 DIAGNOSIS — E668 Other obesity: Secondary | ICD-10-CM | POA: Diagnosis not present

## 2021-08-08 DIAGNOSIS — Z6830 Body mass index (BMI) 30.0-30.9, adult: Secondary | ICD-10-CM | POA: Diagnosis not present

## 2021-08-08 DIAGNOSIS — R0602 Shortness of breath: Secondary | ICD-10-CM | POA: Diagnosis not present

## 2021-08-08 DIAGNOSIS — E039 Hypothyroidism, unspecified: Secondary | ICD-10-CM

## 2021-08-08 DIAGNOSIS — R7309 Other abnormal glucose: Secondary | ICD-10-CM

## 2021-08-08 DIAGNOSIS — E6609 Other obesity due to excess calories: Secondary | ICD-10-CM

## 2021-08-08 DIAGNOSIS — Z1331 Encounter for screening for depression: Secondary | ICD-10-CM | POA: Diagnosis not present

## 2021-08-08 DIAGNOSIS — E66811 Obesity, class 1: Secondary | ICD-10-CM

## 2021-08-09 LAB — INSULIN, RANDOM: INSULIN: 5.7 u[IU]/mL (ref 2.6–24.9)

## 2021-08-09 LAB — HEMOGLOBIN A1C
Est. average glucose Bld gHb Est-mCnc: 114 mg/dL
Hgb A1c MFr Bld: 5.6 % (ref 4.8–5.6)

## 2021-08-09 LAB — VITAMIN D 25 HYDROXY (VIT D DEFICIENCY, FRACTURES): Vit D, 25-Hydroxy: 37.3 ng/mL (ref 30.0–100.0)

## 2021-08-09 LAB — VITAMIN B12: Vitamin B-12: 798 pg/mL (ref 232–1245)

## 2021-08-09 NOTE — Progress Notes (Signed)
? ? ? ?Chief Complaint:  ? ?OBESITY ?Kodi Scherger (MR# [id number]) is a 46 y.o. female who presents for evaluation and treatment of obesity and related comorbidities. Current BMI is Body mass index is 30.54 kg/m?Carol Welch has been struggling with her weight for many years and has been unsuccessful in either losing weight, maintaining weight loss, or reaching her healthy weight goal. ? ?Eastlyn states that she does energy cooking.  ? ?Kennedee is currently in the action stage of change and ready to dedicate time achieving and maintaining a healthier weight. Shaquela is interested in becoming our patient and working on intensive lifestyle modifications including (but not limited to) diet and exercise for weight loss. ? ?Gayathri's habits were reviewed today and are as follows: Her family eats meals together, she thinks her family will eat healthier with her, she struggles with family and or coworkers weight loss sabotage, her desired weight loss is 37 pounds, she started gaining weight after pregnancy, her heaviest weight ever was 205 pounds, she skips meals frequently, she has problems with excessive hunger, and she frequently eats larger portions than normal. ? ?Depression Screen ?Alvilda's Food and Mood (modified PHQ-9) score was 19. ? ? ?  08/08/2021  ?  7:50 AM  ?Depression screen PHQ 2/9  ?Decreased Interest 2  ?Down, Depressed, Hopeless 3  ?PHQ - 2 Score 5  ?Altered sleeping 2  ?Tired, decreased energy 3  ?Change in appetite 2  ?Feeling bad or failure about yourself  3  ?Trouble concentrating 2  ?Moving slowly or fidgety/restless 1  ?Suicidal thoughts 1  ?PHQ-9 Score 19  ?Difficult doing work/chores Not difficult at all  ? ?Subjective:  ? ?1. Other fatigue ?Zyrielle will continue activities. Deshannon admits to daytime somnolence and admits to waking up still tired. Patient has a history of symptoms of morning fatigue and morning headache. Jawanna generally gets 6 or 7 hours of sleep per night, and states that she has  difficulty falling asleep and generally restful sleep. Snoring is present. Apneic episodes are not present. Epworth Sleepiness Score is 11.   ? ?2. SOB (shortness of breath) on exertion ?Charnise will continue activities. Kevona notes increasing shortness of breath with exercising and seems to be worsening over time with weight gain. She notes getting out of breath sooner with activity than she used to. This has not gotten worse recently. Lorana denies shortness of breath at rest or orthopnea.  ? ?3. Acquired hypothyroidism ?Arriyana is taking Levothyroxine currently.  ? ?4. Elevated glucose ?Verga has a family history of diabetes. ? ?5. Fatty liver ?Krisanne had an ultra sound last year.  ? ?6. Vitamin D deficiency ?Yaslyn is not on Vitamin D currently.  ? ?7. Vitamin B 12 deficiency ?Tiani is currently not on medications.  ? ?Assessment/Plan:  ? ?1. Other fatigue ?Azcena will gradually increase activities. We will review EKG today. Chemise does feel that her weight is causing her energy to be lower than it should be. Fatigue may be related to obesity, depression or many other causes. Labs will be ordered, and in the meanwhile, Deliana will focus on self care including making healthy food choices, increasing physical activity and focusing on stress reduction.  ?- EKG 12-Lead ? ?2. SOB (shortness of breath) on exertion ?Azcena will gradually increase activities. Chastin does feel that she gets out of breath more easily that she used to when she exercises. Codie's shortness of breath appears to be obesity related and exercise induced. She has agreed to work on [employer]  loss and gradually increase exercise to treat her exercise induced shortness of breath. Will continue to monitor closely.  ? ?3. Acquired hypothyroidism ?Orpah will continue taking Levothyroxine. Orders and follow up as documented in patient record. ? ?Counseling ?Good thyroid control is important for overall health. Supratherapeutic thyroid  levels are dangerous and will not improve weight loss results. ?Counseling: The correct way to take levothyroxine is fasting, with water, separated by at least 30 minutes from breakfast, and separated by more than 4 hours from calcium, iron, multivitamins, acid reflux medications (PPIs).   ? ?4. Elevated glucose ?We will check A1C and insulin today.  ? ?- Insulin, random ?- Hemoglobin A1c ? ?5. Fatty liver ?Emersyn will work on plan and exercise. We discussed the likely diagnosis of non-alcoholic fatty liver disease today and how this condition is obesity related. Aquanetta was educated the importance of weight loss. Harjit agreed to continue with her weight loss efforts with healthier diet and exercise as an essential part of her treatment plan.  ? ?6. Vitamin D deficiency ?Low Vitamin D level contributes to fatigue and are associated with obesity, breast, and colon cancer. We will check Vitamin D today and Ziah will follow-up for routine testing of Vitamin D, at least 2-3 times per year to avoid over-replacement. ? ?- VITAMIN D 25 Hydroxy (Vit-D Deficiency, Fractures) ? ?7. Vitamin B 12 deficiency ?The diagnosis was reviewed with the patient. We will check Vitamin B 12 today. Counseling provided today, see below. We will continue to monitor. Orders and follow up as documented in patient record. ? ?Counseling ?The body needs vitamin B12: to make red blood cells; to make DNA; and to help the nerves work properly so they can carry messages from the brain to the body.  ?The main causes of vitamin B12 deficiency include dietary deficiency, digestive diseases, pernicious anemia, and having a surgery in which part of the stomach or small intestine is removed.  ?Certain medicines can make it harder for the body to absorb vitamin B12. These medicines include: heartburn medications; some antibiotics; some medications used to treat diabetes, gout, and high cholesterol.  ?In some cases, there are no symptoms of this  condition. If the condition leads to anemia or nerve damage, various symptoms can occur, such as weakness or fatigue, shortness of breath, and numbness or tingling in your hands and feet.   ?Treatment:  ?May include taking vitamin B12 supplements.  ?Avoid alcohol.  ?Eat lots of healthy foods that contain vitamin B12: ?Beef, pork, chicken, [place], and organ meats, such as liver.  ?Seafood: This includes clams, rainbow trout, salmon, tuna, and haddock. Eggs.  ?Cereal and dairy products that are fortified: This means that vitamin B12 has been added to the food.   ?- Vitamin B12 ? ?8. Depression screen ?Ryleah had a positive depression screening. Depression is commonly associated with obesity and often results in emotional eating behaviors. We will monitor this closely and work on CBT to help improve the non-hunger eating patterns. Referral to Psychology may be required if no improvement is seen as she continues in our clinic.  ? ?9. Class 1 obesity with serious comorbidity and body mass index (BMI) of 30.0 to 30.9 in adult, unspecified obesity type ?Wandalee is currently in the action stage of change and her goal is to continue with weight loss efforts. I recommend Chardonnay begin the structured treatment plan as follows: ? ?She has agreed to the Category 1 Plan. ? ?Aleiya will continue mel planning. We reviewed labs  from 05/29/2021 BMP and CBC and  TSH, T4, and glucose on 11/1582022. ? ?Exercise goals:  Luan is currently walking.    ? ?Behavioral modification strategies: increasing lean protein intake, decreasing simple carbohydrates, increasing vegetables, increasing water intake, decreasing eating out, no skipping meals, meal planning and cooking strategies, keeping healthy foods in the home, and planning for success. ? ?She was informed of the importance of frequent follow-up visits to maximize her success with intensive lifestyle modifications for her multiple health conditions. She was informed we would discuss  her lab results at her next visit unless there is a critical issue that needs to be addressed sooner. Jamin agreed to keep her next visit at the agreed upon time to discuss these results. ? ?Objective:  ? ?Blo

## 2021-08-13 DIAGNOSIS — E6609 Other obesity due to excess calories: Secondary | ICD-10-CM | POA: Insufficient documentation

## 2021-08-13 DIAGNOSIS — Z6826 Body mass index (BMI) 26.0-26.9, adult: Secondary | ICD-10-CM | POA: Insufficient documentation

## 2021-08-17 DIAGNOSIS — F331 Major depressive disorder, recurrent, moderate: Secondary | ICD-10-CM | POA: Diagnosis not present

## 2021-08-20 ENCOUNTER — Ambulatory Visit: Payer: Medicaid Other | Attending: Family Medicine | Admitting: Physical Therapy

## 2021-08-20 ENCOUNTER — Encounter: Payer: Self-pay | Admitting: Physical Therapy

## 2021-08-20 DIAGNOSIS — M7918 Myalgia, other site: Secondary | ICD-10-CM | POA: Insufficient documentation

## 2021-08-20 DIAGNOSIS — M6281 Muscle weakness (generalized): Secondary | ICD-10-CM | POA: Insufficient documentation

## 2021-08-20 DIAGNOSIS — M5441 Lumbago with sciatica, right side: Secondary | ICD-10-CM | POA: Diagnosis not present

## 2021-08-20 DIAGNOSIS — M25651 Stiffness of right hip, not elsewhere classified: Secondary | ICD-10-CM | POA: Diagnosis not present

## 2021-08-20 DIAGNOSIS — G8929 Other chronic pain: Secondary | ICD-10-CM | POA: Insufficient documentation

## 2021-08-20 NOTE — Therapy (Addendum)
OUTPATIENT PHYSICAL THERAPY THORACOLUMBAR EVALUATION   Patient Name: Carol Welch MRN: [id number] DOB:11-12-1975, 46 y.o., female Today's Date: 08/20/2021   PT End of Session - 08/20/21 0907     Visit Number 1    Number of Visits 13    Date for PT Re-Evaluation 10/01/21    Authorization Type [employer]  No tx, estimunattended, VASO or ionto    PT Start Time 0845    PT Stop Time 0930    PT Time Calculation (min) 45 min    Activity Tolerance Patient tolerated treatment well;Patient limited by pain    Behavior During Therapy WFL for tasks assessed/performed             Past Medical History:  Diagnosis Date   Anal fissure    Colitis    Depression    Edema, lower extremity    Fatty liver    Hypothyroidism    Low blood pressure    PONV (postoperative nausea and vomiting)    SVD (spontaneous vaginal delivery) 06/30/2015   Vitamin D deficiency    Past Surgical History:  Procedure Laterality Date   BREAST CYST ASPIRATION Right 02/13/2018   CYSTOSCOPY N/A 12/30/2019   Procedure: CYSTOSCOPY;  Surgeon: Janyth Contes, MD;  Location: [facility];  Service: Gynecology;  Laterality: N/A;   ENDOVENOUS ABLATION SAPHENOUS VEIN W/ LASER Right 10/08/2018   endovenous laser ablation right saphenous vein by Deitra Mayo MD   LAPAROSCOPIC VAGINAL HYSTERECTOMY WITH SALPINGECTOMY Bilateral 12/30/2019   Procedure: LAPAROSCOPIC ASSISTED VAGINAL HYSTERECTOMY WITH SALPINGECTOMY;  Surgeon: Janyth Contes, MD;  Location: [facility];  Service: Gynecology;  Laterality: Bilateral;   Patient Active Problem List   Diagnosis Date Noted   Class 1 obesity due to excess calories with body mass index (BMI) of 30.0 to 30.9 in adult 08/13/2021   Trapezius muscle strain, right, initial encounter 03/20/2021   Gastroenteritis 10/03/2020   Hand trauma, right, initial encounter 07/18/2020   Encounter for surveillance of abnormal nevi 07/09/2020    Acquired hypothyroidism 07/07/2020   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   Neck pain 01/18/2020   S/P laparoscopic assisted vaginal hysterectomy (LAVH) 12/30/2019   External thrombosed hemorrhoids 09/11/2019   MDD (major depressive disorder) 08/24/2019   Orthostatic hypotension 08/24/2019   Hypothyroidism 07/06/2018   Family history of congenital anomaly 08/27/2016    PCP: Gladys Damme, MD  REFERRING PROVIDER: Kinnie Feil, MD  REFERRING DIAG: M79.18 (ICD-10-CM) - Piriformis muscle pain  THERAPY DIAG:  Chronic right-sided low back pain with right-sided sciatica  Stiffness of right hip, not elsewhere classified  ONSET DATE: April/May 2022  SUBJECTIVE:  SUBJECTIVE STATEMENT: I slipped at my house while I was mopping, It really was hurting on tailbone.  I was tripping and now I feel pinching on by R buttock and sometimes a sharp pain goes up my back even when I am just walking around. Now I am working in my garden and I notice that I am having more discomfort over my R buttock.   PERTINENT HISTORY:  Colitis, depression, hypothyroidism, vitamin D deficiency, fatty liver  PAIN:  Are you having pain? Yes: NPRS scale: 4/10 Pain location: R buttock Pain description: sharp in R buttock, cramping and up my back Aggravating factors: walking and standing hurts the most, gardening, standing for longer than 10 min, sometimes pushing on the accelerator,going up and down steps, I also have pain when riding bicycle so I walk instead of ride Relieving factors: stretches  using heat at home At worst my R side buttock pain ins 8/10   PRECAUTIONS: None  WEIGHT BEARING RESTRICTIONS No  FALLS:  Has patient fallen in last 6 months? No  LIVING ENVIRONMENT: Lives with: lives with their  family, lives with their spouse, lives with their son, and lives with their daughter Lives in: House/apartment Stairs: Yes: External: 2 steps; none Has following equipment at home: None  OCCUPATION: SAHM  PLOF: Independent  PATIENT GOALS want to be able to ride bicycle and garden without pain, I would like to get back to running again for exercise but I do not now because of pain   OBJECTIVE:   DIAGNOSTIC FINDINGS:  None for Low back  PATIENT SURVEYS:  LEFS 36/80 x 100  45%  SCREENING FOR RED FLAGS: Bowel or bladder incontinence: No Spinal tumors: No Cauda equina syndrome: No Compression fracture: No Abdominal aneurysm: No  COGNITION:  Overall cognitive status: Within functional limits for tasks assessed     SENSATION: WFL  MUSCLE LENGTH: Hamstrings: Right 65 deg; Left 70 deg   POSTURE:  Forward head and rounded shoulder   R elevated pelvic level  R ASIS more posterior than L  PALPATION: Limited segmental flexion and lumbo sacral junction TTP over R piriformis and over R SI Hypomobility lumbar spine  LUMBAR ROM:   Active  A/PROM  08/20/2021  Flexion Fingertips to bil ankles  Extension Pain with ext 20 degrees  Right lateral flexion Pain in R SI fingertip to knee jt line  Left lateral flexion Pain in R SI with fingertip to knee jt line  Right rotation 75% no pain  Left rotation 75% no pain   (Blank rows = not tested)  LE ROM:  Active  Right 08/20/2021 Left 08/20/2021  Hip flexion 95 110  Hip extension    Hip abduction    Hip adduction    Hip internal rotation 45 47  Hip external rotation 35 49  Knee flexion    Knee extension    Ankle dorsiflexion    Ankle plantarflexion    Ankle inversion    Ankle eversion     (Blank rows = not tested)  LE MMT:  MMT Right 08/20/2021 Left 08/20/2021  Hip flexion 4- 4+  Hip extension 3+ limited by pain R SI 4  Hip abduction 3- 4  Hip adduction    Hip internal rotation 4 4  Hip external rotation 4- 4-   Knee flexion 5 5  Knee extension 5 5  Ankle dorsiflexion 5 5  Ankle plantarflexion    Ankle inversion    Ankle eversion     (Blank rows =  not tested)  LUMBAR SPECIAL TESTS:  Straight leg raise test: Negative, Slump test: Negative, SI Compression/distraction test: Positive, and FABER test: Positive Sacral Spring test positive. Lumbo slyacral stiffiness on right . Stiff R hip flexion to 95 degrees active FUNCTIONAL TESTS:  5 times sit to stand: 9.98 sec  GAIT: Distance walked: 150 Assistive device utilized: None Level of assistance: Complete Independence Comments: Pt with slight antalgic gait on R    TODAY'S TREATMENT  Issue initial HEP MET for R SI joint   PATIENT EDUCATION:  Education details: POC, explanation of LEFS, iniital HEP Explanation of findings Person educated: Patient Education method: Explanation, Demonstration, Tactile cues, Verbal cues, and Handouts Education comprehension: verbalized understanding, returned demonstration, verbal cues required, tactile cues required, and needs further education   HOME EXERCISE PROGRAM: Access Code: [facility] URL: [email or web address] Date: 08/20/2021 Prepared by: Voncille Lo  Program Notes sit in chair with hands under R thigh. Press/resist into hands for hamstring resistance to decrease pain in R Sacral Iliac area  Exercises - Supine Piriformis Stretch with Foot on Ground  - 1-2 x daily - 7 x weekly - 1 sets - 3 reps - 10-15 hold - Supine Figure 4 Piriformis Stretch  - 1-2 x daily - 7 x weekly - 1 sets - 3 reps - 10-15 hold  ASSESSMENT:  CLINICAL IMPRESSION: Patient is a 46 y.o. female who was seen today for physical therapy evaluation and treatment for R SI/ piriformis pain which limits her ability to perform household chores, gardening and riding bicycles with her children.  Pt has decreased mobility in segmental flexion sacrum at lumbosacral junction.  Pt with limited hip mobility in hip  flexion.  Pt has difficulty standing more than 10 minutes or pressing the accelerator of car without pain up to 8/10.  Pt will benefit from skilled PT to address deficits and reduce pain for more comfortable   OBJECTIVE IMPAIRMENTS Abnormal gait, decreased activity tolerance, decreased mobility, decreased ROM, decreased strength, hypomobility, impaired flexibility, improper body mechanics, postural dysfunction, obesity, and pain.   ACTIVITY LIMITATIONS cleaning, driving, meal prep, yard work, and shopping.   PERSONAL FACTORS Fitness are also affecting patient's functional outcome.  Pt has not been able to exercise due to this SI/piriformis pain and would like to return to running eventually but would like to at least return to biking with children   REHAB POTENTIAL: Excellent  CLINICAL DECISION MAKING: Evolving/moderate complexity  EVALUATION COMPLEXITY: Moderate   GOALS: Goals reviewed with patient? Yes  SHORT TERM GOALS: Target date: 09/10/2021  Pt will be independent with initial HEP Baseline: Goal status: INITIAL  2.  Pt will reduce pain during  standing to 5/10 from 8/10 and stand for 15 minutes Baseline: Pt with 8/10 pain if walking standing more than 10 minutes Goal status: INITIAL  3.  Pt will be able to press accelerator of car without exacerbating R SI pain Baseline: Pt with pain pressing accelerator of car 5/10 to 8/10 when R SI irritated Goal status: INITIAL  4.  Pt will begin home walking program daily for 15 minutes minimum Baseline: Pt not routinely exercising  now Goal status: INITIAL    LONG TERM GOALS: Target date: 10/01/2021  Pt will be independent with advanced HEP Baseline:  Goal status: INITIAL  2.  Patient will report improved functional level on LEFS >/= 70/80 in order to allow for return to Baseline: LEFS 36/80 x 100 =45 % Goal status: INITIAL  3.  Pt will be able to  ride bikes with children without exacerbating pain greater than 2/10   Baseline: Pt now has pain at rest 4/10 and up to 8/10 with activity Goal status: INITIAL  4.  Pt will be able to get into kneeling positions for gardening Baseline: Pt with pain up to 8/10 and avoids steps and kneeling Goal status: INITIAL  5.  Pt will be consistent with home walking program at least 7000 steps 4 days a week Baseline: No routine exercise at this point Goal status: INITIAL  6.  Pt will increase ER and hip flexion of R LE in order to more easily don clothing and kneel for gardening Baseline: Pt with pain with hip flex past 95 degrees and when kneeling Goal status: INITIAL   PLAN: PT FREQUENCY: 2x/week  PT DURATION: 6 weeks  PLANNED INTERVENTIONS: Therapeutic exercises, Therapeutic activity, Neuromuscular re-education, Gait training, Patient/Family education, Joint mobilization, Dry Needling, Electrical stimulation, Spinal mobilization, Cryotherapy, Moist heat, Taping, and Manual therapy.  PLAN FOR NEXT SESSION: check inflare/outflare SI TPDN   [employer] Authorization   Choose one: Rehabilitative  Standardized Assessment or Functional Outcome Tool: See Pain Assessment and LEFS  Score or Percent Disability: 36/80 x 100  = 45%  Body Parts Treated (Select each separately):  Lumbopelvic. Overall deficits/functional limitations for body part selected: moderate Hip  R  Overall deficits/functional limitations for body part selected Moderate    If treatment provided at initial evaluation, no treatment charged due to lack of authorization.    Voncille Lo, PT, [place] Certified Exercise Expert for the Aging Adult  08/20/21 12:25 PM Phone: [phone] Fax: [place], [place], [place] Certified Exercise Expert for the Aging Adult  10/16/21 7:44 AM Phone: [phone] Fax: [phone]

## 2021-08-20 NOTE — Patient Instructions (Signed)
Access Code: [email or web address] ?URL: [email or web address] ?Date: 08/20/2021 ?Prepared by: Garen Lah ? ?Program Notes ?sit in chair with hands under R thigh. Press/resist into hands for hamstring resistance to decrease pain in R Sacral Iliac area ? ?Exercises ?- Supine Piriformis Stretch with Foot on Ground  - 1-2 x daily - 7 x weekly - 1 sets - 3 reps - 10-15 hold ?- Supine Figure 4 Piriformis Stretch  - 1-2 x daily - 7 x weekly - 1 sets - 3 reps - 10-15 hold ? ?Garen Lah, PT, [facility] ?Certified Exercise Expert for the Aging Adult  ?08/20/21 9:35 AM ?Phone: [phone] ?Fax: [phone]  ?

## 2021-08-23 ENCOUNTER — Encounter (INDEPENDENT_AMBULATORY_CARE_PROVIDER_SITE_OTHER): Payer: Self-pay | Admitting: Bariatrics

## 2021-08-23 ENCOUNTER — Other Ambulatory Visit: Payer: Self-pay

## 2021-08-23 ENCOUNTER — Ambulatory Visit (INDEPENDENT_AMBULATORY_CARE_PROVIDER_SITE_OTHER): Payer: Medicaid Other | Admitting: Bariatrics

## 2021-08-23 VITALS — BP 112/73 | HR 57 | Temp 98.1°F | Ht 62.0 in | Wt 165.0 lb

## 2021-08-23 DIAGNOSIS — E039 Hypothyroidism, unspecified: Secondary | ICD-10-CM | POA: Diagnosis not present

## 2021-08-23 DIAGNOSIS — Z6830 Body mass index (BMI) 30.0-30.9, adult: Secondary | ICD-10-CM

## 2021-08-23 DIAGNOSIS — E669 Obesity, unspecified: Secondary | ICD-10-CM

## 2021-08-23 DIAGNOSIS — E559 Vitamin D deficiency, unspecified: Secondary | ICD-10-CM

## 2021-08-23 DIAGNOSIS — F331 Major depressive disorder, recurrent, moderate: Secondary | ICD-10-CM

## 2021-08-23 MED ORDER — VITAMIN D (ERGOCALCIFEROL) 1.25 MG (50000 UNIT) PO CAPS: 50000.0000 [IU] | ORAL_CAPSULE | ORAL | 0 refills | Status: DC

## 2021-08-23 NOTE — Therapy (Addendum)
OUTPATIENT PHYSICAL THERAPY TREATMENT NOTE   Patient Name: Carol Welch MRN: [id number] DOB:[birth year], 46 y.o., female Today's Date: 08/27/2021  PCP: Gladys Damme, MD REFERRING PROVIDER: Gladys Damme, MD  END OF SESSION:   PT End of Session - 08/27/21 1024     Visit Number 2    Number of Visits 13    Date for PT Re-Evaluation 10/01/21    Authorization Type [employer]  No tx, estimunattended, VASO or ionto    PT Start Time 1020    PT Stop Time 1100    PT Time Calculation (min) 40 min    Activity Tolerance Patient tolerated treatment well;Patient limited by pain    Behavior During Therapy WFL for tasks assessed/performed             Past Medical History:  Diagnosis Date   Anal fissure    Colitis    Depression    Edema, lower extremity    Fatty liver    Hypothyroidism    Low blood pressure    PONV (postoperative nausea and vomiting)    SVD (spontaneous vaginal delivery) 06/30/2015   Vitamin D deficiency    Past Surgical History:  Procedure Laterality Date   BREAST CYST ASPIRATION Right 02/13/2018   CYSTOSCOPY N/A 12/30/2019   Procedure: CYSTOSCOPY;  Surgeon: Janyth Contes, MD;  Location: [place];  Service: Gynecology;  Laterality: N/A;   ENDOVENOUS ABLATION SAPHENOUS VEIN W/ LASER Right 10/08/2018   endovenous laser ablation right saphenous vein by Deitra Mayo MD   LAPAROSCOPIC VAGINAL HYSTERECTOMY WITH SALPINGECTOMY Bilateral 12/30/2019   Procedure: LAPAROSCOPIC ASSISTED VAGINAL HYSTERECTOMY WITH SALPINGECTOMY;  Surgeon: Janyth Contes, MD;  Location: [place];  Service: Gynecology;  Laterality: Bilateral;   Patient Active Problem List   Diagnosis Date Noted   Class 1 obesity due to excess calories with body mass index (BMI) of 30.0 to 30.9 in adult 08/13/2021   Trapezius muscle strain, right, initial encounter 03/20/2021   Gastroenteritis 10/03/2020   Hand trauma, right, initial encounter  07/18/2020   Encounter for surveillance of abnormal nevi 07/09/2020   Acquired hypothyroidism 07/07/2020   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   Neck pain 01/18/2020   S/P laparoscopic assisted vaginal hysterectomy (LAVH) 12/30/2019   External thrombosed hemorrhoids 09/11/2019   MDD (major depressive disorder) 08/24/2019   Orthostatic hypotension 08/24/2019   Hypothyroidism 07/06/2018   Family history of congenital anomaly 08/27/2016    REFERRING DIAG: M79.18 (ICD-10-CM) - Piriformis muscle pain  THERAPY DIAG:  Chronic right-sided low back pain with right-sided sciatica   Stiffness of right hip, not elsewhere classified  PERTINENT HISTORY: Colitis, depression, hypothyroidism, vitamin D deficiency, fatty liver  PRECAUTIONS: none  SUBJECTIVE: I have 5-6/10 pain in my R buttock.  I have not tried to ride bikes and I have been walking instead.  I keep walking even if I hurt  PAIN:  Are you having pain? Yes: NPRS scale: 6/10 Pain location: R piriformis Pain description: sharp in r buttock Aggravating factors:   Relieving factors:     Aggravating factors: walking and standing hurts the most, gardening, standing for longer than 10 min, sometimes pushing on the accelerator,going up and down steps, I also have pain when riding bicycle so I walk instead of ride Relieving factors: stretches  using heat at home At worst my R side buttock pain ins 8/10  OBJECTIVE: (objective measures completed at initial evaluation unless otherwise dated)     OBJECTIVE:    DIAGNOSTIC FINDINGS:  None for Low back   PATIENT SURVEYS:  LEFS 36/80 x 100  45%   SCREENING FOR RED FLAGS: Bowel or bladder incontinence: No Spinal tumors: No Cauda equina syndrome: No Compression fracture: No Abdominal aneurysm: No   COGNITION:           Overall cognitive status: Within functional limits for tasks assessed                          SENSATION: WFL   MUSCLE LENGTH: Hamstrings:  Right 65 deg; Left 70 deg     POSTURE:  Forward head and rounded shoulder    R elevated pelvic level  R ASIS more posterior than L   PALPATION: Limited segmental flexion and lumbo sacral junction TTP over R piriformis and over R SI Hypomobility lumbar spine   LUMBAR ROM:    Active  A/PROM  08/20/2021  Flexion Fingertips to bil ankles  Extension Pain with ext 20 degrees  Right lateral flexion Pain in R SI fingertip to knee jt line  Left lateral flexion Pain in R SI with fingertip to knee jt line  Right rotation 75% no pain  Left rotation 75% no pain   (Blank rows = not tested)   LE ROM:   Active  Right 08/20/2021 Left 08/20/2021  Hip flexion 95 110  Hip extension      Hip abduction      Hip adduction      Hip internal rotation 45 47  Hip external rotation 35 49  Knee flexion      Knee extension      Ankle dorsiflexion      Ankle plantarflexion      Ankle inversion      Ankle eversion       (Blank rows = not tested)   LE MMT:   MMT Right 08/20/2021 Left 08/20/2021  Hip flexion 4- 4+  Hip extension 3+ limited by pain R SI 4  Hip abduction 3- 4  Hip adduction      Hip internal rotation 4 4  Hip external rotation 4- 4-  Knee flexion 5 5  Knee extension 5 5  Ankle dorsiflexion 5 5  Ankle plantarflexion      Ankle inversion      Ankle eversion       (Blank rows = not tested)   LUMBAR SPECIAL TESTS:  Straight leg raise test: Negative, Slump test: Negative, SI Compression/distraction test: Positive, and FABER test: Positive Sacral Spring test positive. Lumbo slyacral stiffiness on right . Stiff R hip flexion to 95 degrees active FUNCTIONAL TESTS:  5 times sit to stand: 9.98 sec   GAIT: Distance walked: 150 Assistive device utilized: None Level of assistance: Complete Independence Comments: Pt with slight antalgic gait on R       TODAY'S TREATMENT  [facility] Adult PT Treatment:                                                DATE: 08-27-21 Therapeutic  Exercise: -Supine Piriformis Stretch with Foot on Ground  3 reps  30 sec hold R side - Supine Figure 4 Piriformis Stretch  3 reps - 30 sec hold R side - Clamshell with Resistance  -3 sets - 10 reps - Supine Bridge with Mini Swiss Ball Between Knees 3 sets -  10 reps - Supine Lower Trunk Rotation  5 reps - 20 sec  hold on R and then L Manual Therapy: STW - to  Right QL and R buttocks/piriformis  Myofascial release of R QL and for piriformis Trigger Point Dry Needling Treatment: Pre-treatment instruction: Patient instructed on dry needling rationale, procedures, and possible side effects including pain during treatment (achy,cramping feeling), bruising, drop of blood, lightheadedness, nausea, sweating. Patient Consent Given: Yes Education handout provided: Yes Muscles treated: Left piriformis, left gluteals and Left Qaudratus Lumborum  Needle size and number: .30x69m x 5 Electrical stimulation performed: No Parameters: N/A Treatment response/outcome: Twitch response elicited and Palpable decrease in muscle tension Post-treatment instructions: Patient instructed to expect possible mild to moderate muscle soreness later today and/or tomorrow. Patient instructed in methods to reduce muscle soreness and to continue prescribed HEP. If patient was dry needled over the lung field, patient was instructed on signs and symptoms of pneumothorax and, however unlikely, to see immediate medical attention should they occur. Patient was also educated on signs and symptoms of infection and to seek medical attention should they occur. Patient verbalized understanding of these instructions and education.   Modalities: Moist hot pack  Issue initial HEP MET for R SI joint     PATIENT EDUCATION:  Education details: POC, explanation of LEFS, iniital HEP Explanation of findings education on TPDN precautions and aftercare Person educated: Patient Education method: Explanation, Demonstration, Tactile cues, Verbal  cues, and Handouts Education comprehension: verbalized understanding, returned demonstration, verbal cues required, tactile cues required, and needs further education     HOME EXERCISE PROGRAM: [facility] [email or web address] Date: 08/20/2021 Prepared by: LVoncille Lo  Program Notes sit in chair with hands under R thigh. Press/resist into hands for hamstring resistance to decrease pain in R Sacral Iliac area   Exercises - Supine Piriformis Stretch with Foot on Ground  - 1-2 x daily - 7 x weekly - 1 sets - 3 reps - 10-15 hold - Supine Figure 4 Piriformis Stretch  - 1-2 x daily - 7 x weekly - 1 sets - 3 reps - 10-15 hold  added 08-27-21 - Clamshell with Resistance  - 1 x daily - 7 x weekly - 3 sets - 10 reps - Supine Bridge with Mini Swiss Ball Between Knees  - 1 x daily - 7 x weekly - 3 sets - 10 reps - Supine Lower Trunk Rotation  - 2 x daily - 7 x weekly - 1 sets - 5 reps - 20 sec  hold ASSESSMENT:   CLINICAL IMPRESSION: Pt enters clinic with pre school dtr for 2nd visit and pain in R buttock 6/10. Pt consents to TPDN and is closely monitored throughout session for pain. Pt has decreased pain after TPDN and decreased muscle tension post session. Pt states 5/10 after session from 6 but able to move with more fluidity. Pt performed HEP with modifications and additions. Will continue with progressive strength and pain management in future remaining sessions.  EVAL- Patient is a 46y.o. female who was seen today for physical therapy evaluation and treatment for R SI/ piriformis pain which limits her ability to perform household chores, gardening and riding bicycles with her children.  Pt has decreased mobility in segmental flexion sacrum at lumbosacral junction.  Pt with limited hip mobility in hip flexion.  Pt has difficulty standing more than 10 minutes or pressing the accelerator of car without pain up to 8/10.  Pt will benefit from skilled PT to  address  deficits and reduce pain for more comfortable     OBJECTIVE IMPAIRMENTS Abnormal gait, decreased activity tolerance, decreased mobility, decreased ROM, decreased strength, hypomobility, impaired flexibility, improper body mechanics, postural dysfunction, obesity, and pain.    ACTIVITY LIMITATIONS cleaning, driving, meal prep, yard work, and shopping.    PERSONAL FACTORS Fitness are also affecting patient's functional outcome.  Pt has not been able to exercise due to this SI/piriformis pain and would like to return to running eventually but would like to at least return to biking with children     REHAB POTENTIAL: Excellent   CLINICAL DECISION MAKING: Evolving/moderate complexity   EVALUATION COMPLEXITY: Moderate     GOALS: Goals reviewed with patient? Yes   SHORT TERM GOALS: Target date: 09/10/2021   Pt will be independent with initial HEP Baseline: Goal status: INITIAL   2.  Pt will reduce pain during  standing to 5/10 from 8/10 and stand for 15 minutes Baseline: Pt with 8/10 pain if walking standing more than 10 minutes Goal status: INITIAL   3.  Pt will be able to press accelerator of car without exacerbating R SI pain Baseline: Pt with pain pressing accelerator of car 5/10 to 8/10 when R SI irritated Goal status: INITIAL   4.  Pt will begin home walking program daily for 15 minutes minimum Baseline: Pt not routinely exercising  now Goal status: INITIAL       LONG TERM GOALS: Target date: 10/01/2021   Pt will be independent with advanced HEP Baseline:  Goal status: INITIAL   2.  Patient will report improved functional level on LEFS >/= 70/80 in order to allow for return to Baseline: LEFS 36/80 x 100 =45 % Goal status: INITIAL   3.  Pt will be able to ride bikes with children without exacerbating pain greater than 2/10  Baseline: Pt now has pain at rest 4/10 and up to 8/10 with activity Goal status: INITIAL   4.  Pt will be able to get into kneeling positions for  gardening Baseline: Pt with pain up to 8/10 and avoids steps and kneeling Goal status: INITIAL   5.  Pt will be consistent with home walking program at least 7000 steps 4 days a week Baseline: No routine exercise at this point Goal status: INITIAL   6.  Pt will increase ER and hip flexion of R LE in order to more easily don clothing and kneel for gardening Baseline: Pt with pain with hip flex past 95 degrees and when kneeling Goal status: INITIAL     PLAN: PT FREQUENCY: 2x/week   PT DURATION: 6 weeks   PLANNED INTERVENTIONS: Therapeutic exercises, Therapeutic activity, Neuromuscular re-education, Gait training, Patient/Family education, Joint mobilization, Dry Needling, Electrical stimulation, Spinal mobilization, Cryotherapy, Moist heat, Taping, and Manual therapy.   PLAN FOR NEXT SESSION: check inflare/outflare SI TPDN     [employer] Authorization    Choose one: Rehabilitative   Standardized Assessment or Functional Outcome Tool: See Pain Assessment and LEFS   Score or Percent Disability: 36/80 x 100  = 45%   Body Parts Treated (Select each separately):  Lumbopelvic. Overall deficits/functional limitations for body part selected: moderate Hip  R  Overall deficits/functional limitations for body part selected Moderate       If treatment provided at initial evaluation, no treatment charged due to lack of authorization.     Voncille Lo, PT, [place] Certified Exercise Expert for the Aging Adult  08/27/21 10:55 AM Phone: [phone] Fax: [phone]  Voncille Lo, PT, [place] Certified Exercise Expert for the Aging Adult  10/16/21 7:47 AM Phone: [phone] Fax: [phone]

## 2021-08-24 MED ORDER — ESCITALOPRAM OXALATE 20 MG PO TABS: 20.0000 mg | ORAL_TABLET | Freq: Every day | ORAL | 2 refills | Status: DC

## 2021-08-27 ENCOUNTER — Encounter: Payer: Self-pay | Admitting: Physical Therapy

## 2021-08-27 ENCOUNTER — Ambulatory Visit: Payer: Medicaid Other | Admitting: Physical Therapy

## 2021-08-27 DIAGNOSIS — M25651 Stiffness of right hip, not elsewhere classified: Secondary | ICD-10-CM

## 2021-08-27 DIAGNOSIS — M6281 Muscle weakness (generalized): Secondary | ICD-10-CM | POA: Diagnosis not present

## 2021-08-27 DIAGNOSIS — M5441 Lumbago with sciatica, right side: Secondary | ICD-10-CM | POA: Diagnosis not present

## 2021-08-27 DIAGNOSIS — G8929 Other chronic pain: Secondary | ICD-10-CM | POA: Diagnosis not present

## 2021-08-27 DIAGNOSIS — M7918 Myalgia, other site: Secondary | ICD-10-CM | POA: Diagnosis not present

## 2021-08-27 NOTE — Patient Instructions (Signed)
Trigger Point Dry Needling ? ?What is Trigger Point Dry Needling (DN)? ?DN is a physical therapy technique used to treat muscle pain and dysfunction. Specifically, DN helps deactivate muscle trigger points (muscle knots).  ?A thin filiform needle is used to penetrate the skin and stimulate the underlying trigger point. The goal is for a local twitch response (LTR) to occur and for the trigger point to relax. No medication of any kind is injected during the procedure.  ? ?What Does Trigger Point Dry Needling Feel Like?  ?The procedure feels different for each individual patient. Some patients report that they do not actually feel the needle enter the skin and overall the process is not painful. Very mild bleeding may occur. However, many patients feel a deep cramping in the muscle in which the needle was inserted. This is the local twitch response.  ? ?How Will I feel after the treatment? ?Soreness is normal, and the onset of soreness may not occur for a few hours. Typically this soreness does not last longer than two days.  ?Bruising is uncommon, however; ice can be used to decrease any possible bruising.  ?In rare cases feeling tired or nauseous after the treatment is normal. In addition, your symptoms may get worse before they get better, this period will typically not last longer than 24 hours.  ? ?What Can I do After My Treatment? ?Increase your hydration by drinking more water for the next 24 hours. ?You may place ice or heat on the areas treated that have become sore, however, do not use heat on inflamed or bruised areas. Heat often brings more relief post needling. ?You can continue your regular activities, but vigorous activity is not recommended initially after the treatment for 24 hours. ?DN is best combined with other physical therapy such as strengthening, stretching, and other therapies.   ?  ?Voncille Lo, PT, [place] ?Certified Exercise Expert for the Aging Adult  ?08/27/21 10:27 AM ?Phone:  [phone] ?Fax: [phone]  ?

## 2021-08-28 ENCOUNTER — Other Ambulatory Visit: Payer: Self-pay | Admitting: Obstetrics and Gynecology

## 2021-08-28 DIAGNOSIS — Z1231 Encounter for screening mammogram for malignant neoplasm of breast: Secondary | ICD-10-CM

## 2021-08-28 NOTE — Therapy (Addendum)
OUTPATIENT PHYSICAL THERAPY TREATMENT NOTE   Patient Name: Carol Welch MRN: [id number] DOB:23-Jul-1975, 46 y.o., female Today's Date: 08/30/2021  PCP: Gladys Damme, MD REFERRING PROVIDER: Gladys Damme, MD  END OF SESSION:   PT End of Session - 08/30/21 1021     Visit Number 3    Number of Visits 13    Date for PT Re-Evaluation 10/01/21    Authorization Type [employer]  No tx, estimunattended, VASO or ionto    PT Start Time 1020    PT Stop Time 1110    PT Time Calculation (min) 50 min    Activity Tolerance Patient tolerated treatment well;Patient limited by pain              Past Medical History:  Diagnosis Date   Anal fissure    Colitis    Depression    Edema, lower extremity    Fatty liver    Hypothyroidism    Low blood pressure    PONV (postoperative nausea and vomiting)    SVD (spontaneous vaginal delivery) 06/30/2015   Vitamin D deficiency    Past Surgical History:  Procedure Laterality Date   BREAST CYST ASPIRATION Right 02/13/2018   CYSTOSCOPY N/A 12/30/2019   Procedure: CYSTOSCOPY;  Surgeon: Janyth Contes, MD;  Location: [facility];  Service: Gynecology;  Laterality: N/A;   ENDOVENOUS ABLATION SAPHENOUS VEIN W/ LASER Right 10/08/2018   endovenous laser ablation right saphenous vein by Deitra Mayo MD   LAPAROSCOPIC VAGINAL HYSTERECTOMY WITH SALPINGECTOMY Bilateral 12/30/2019   Procedure: LAPAROSCOPIC ASSISTED VAGINAL HYSTERECTOMY WITH SALPINGECTOMY;  Surgeon: Janyth Contes, MD;  Location: [place] [facility];  Service: Gynecology;  Laterality: Bilateral;   Patient Active Problem List   Diagnosis Date Noted   Class 1 obesity due to excess calories with body mass index (BMI) of 30.0 to 30.9 in adult 08/13/2021   Trapezius muscle strain, right, initial encounter 03/20/2021   Gastroenteritis 10/03/2020   Hand trauma, right, initial encounter 07/18/2020   Encounter for surveillance of abnormal nevi  07/09/2020   Acquired hypothyroidism 07/07/2020   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   Neck pain 01/18/2020   S/P laparoscopic assisted vaginal hysterectomy (LAVH) 12/30/2019   External thrombosed hemorrhoids 09/11/2019   MDD (major depressive disorder) 08/24/2019   Orthostatic hypotension 08/24/2019   Hypothyroidism 07/06/2018   Family history of congenital anomaly 08/27/2016    REFERRING DIAG: M79.18 (ICD-10-CM) - Piriformis muscle pain  THERAPY DIAG:  Chronic right-sided low back pain with right-sided sciatica  Stiffness of right hip, not elsewhere classified  PERTINENT HISTORY: Colitis, depression, hypothyroidism, vitamin D deficiency, fatty liver  PRECAUTIONS: none  SUBJECTIVE: I really was sore about 24 hours and then the next day I was feeling so much better. 4/10 today    PAIN:  Are you having pain? Yes: NPRS scale: 4/10 Pain location: R piriformis Pain description: sharp in r buttock Aggravating factors:   Relieving factors:     Aggravating factors: walking and standing hurts the most, gardening, standing for longer than 10 min, sometimes pushing on the accelerator,going up and down steps, I also have pain when riding bicycle so I walk instead of ride Relieving factors: stretches  using heat at home At worst my R side buttock pain ins 8/10  OBJECTIVE: (objective measures completed at initial evaluation unless otherwise dated)     OBJECTIVE:    DIAGNOSTIC FINDINGS:  None for Low back   PATIENT SURVEYS:  LEFS 36/80 x 100  45%   SCREENING  FOR RED FLAGS: Bowel or bladder incontinence: No Spinal tumors: No Cauda equina syndrome: No Compression fracture: No Abdominal aneurysm: No   COGNITION:           Overall cognitive status: Within functional limits for tasks assessed                          SENSATION: WFL   MUSCLE LENGTH: Hamstrings: Right 65 deg; Left 70 deg     POSTURE:  Forward head and rounded shoulder    R  elevated pelvic level  R ASIS more posterior than L   PALPATION: Limited segmental flexion and lumbo sacral junction TTP over R piriformis and over R SI Hypomobility lumbar spine   LUMBAR ROM:    Active  A/PROM  08/20/2021  Flexion Fingertips to bil ankles  Extension Pain with ext 20 degrees  Right lateral flexion Pain in R SI fingertip to knee jt line  Left lateral flexion Pain in R SI with fingertip to knee jt line  Right rotation 75% no pain  Left rotation 75% no pain   (Blank rows = not tested)   LE ROM:   Active  Right 08/20/2021 Left 08/20/2021  Hip flexion 95 110  Hip extension      Hip abduction      Hip adduction      Hip internal rotation 45 47  Hip external rotation 35 49  Knee flexion      Knee extension      Ankle dorsiflexion      Ankle plantarflexion      Ankle inversion      Ankle eversion       (Blank rows = not tested)   LE MMT:   MMT Right 08/20/2021 Left 08/20/2021  Hip flexion 4- 4+  Hip extension 3+ limited by pain R SI 4  Hip abduction 3- 4  Hip adduction      Hip internal rotation 4 4  Hip external rotation 4- 4-  Knee flexion 5 5  Knee extension 5 5  Ankle dorsiflexion 5 5  Ankle plantarflexion      Ankle inversion      Ankle eversion       (Blank rows = not tested)   LUMBAR SPECIAL TESTS:  Straight leg raise test: Negative, Slump test: Negative, SI Compression/distraction test: Positive, and FABER test: Positive Sacral Spring test positive. Lumbo slyacral stiffiness on right . Stiff R hip flexion to 95 degrees active FUNCTIONAL TESTS:  5 times sit to stand: 9.98 sec   GAIT: Distance walked: 150 Assistive device utilized: None Level of assistance: Complete Independence Comments: Pt with slight antalgic gait on R       TODAY'S TREATMENT  [facility] Adult PT Treatment:                                                DATE: 08-30-21 Noted R pelvic level elevated - tightened QL Therapeutic Exercise: Supine Piriformis Stretch with Foot  on Ground  3 reps  30 sec hold R side Abdominal setting 10 sec  hold x 10  Isometric Hold with pelvic floor (hooklying) 1 x 10 sec hold pelvic tilt 2 x 10 supine knee march 3 x 10 with abdominal engagement Knee drop 2 x 10 with abd engagement Supine Figure 4 Piriformis Stretch  3 reps - 30 sec hold R side Clamshell with Resistance  -3 sets - 10 reps Supine Bridge with Mini Swiss Ball Between Knees 3 sets - 10 reps Supine Lower Trunk Rotation  5 reps - 20 sec  hold on R and then L Manual Therapy: STW - to  Right QL and R buttocks/piriformis  Myofascial release of R QL and for piriformis  MET for R SI, trunk rotation thrust R rotated forward Trigger Point Dry Needling Treatment: Pre-treatment instruction: Patient instructed on dry needling rationale, procedures, and possible side effects including pain during treatment (achy,cramping feeling), bruising, drop of blood, lightheadedness, nausea, sweating. Patient Consent Given: Yes Education handout provided: Yes Muscles treated:Right  piriformis, R gluteals and R Quadratus Lumborum  Needle size and number: .30x19m x 4 Electrical stimulation performed: No Parameters: N/A Treatment response/outcome: Twitch response elicited and Palpable decrease in muscle tension Post-treatment instructions: Patient instructed to expect possible mild to moderate muscle soreness later today and/or tomorrow. Patient instructed in methods to reduce muscle soreness and to continue prescribed HEP. If patient was dry needled over the lung field, patient was instructed on signs and symptoms of pneumothorax and, however unlikely, to see immediate medical attention should they occur. Patient was also educated on signs and symptoms of infection and to seek medical attention should they occur. Patient verbalized understanding of these instructions and education.   Modalities: Moist hot pack [facility] Adult PT Treatment:                                                DATE:  08-27-21 Therapeutic Exercise: -Supine Piriformis Stretch with Foot on Ground  3 reps  30 sec hold R side - Supine Figure 4 Piriformis Stretch  3 reps - 30 sec hold R side - Clamshell with Resistance  -3 sets - 10 reps - Supine Bridge with Mini Swiss Ball Between Knees 3 sets - 10 reps - Supine Lower Trunk Rotation  5 reps - 20 sec  hold on R and then L Manual Therapy: STW - to  Right QL and R buttocks/piriformis  Myofascial release of R QL and for piriformis Trigger Point Dry Needling Treatment: Pre-treatment instruction: Patient instructed on dry needling rationale, procedures, and possible side effects including pain during treatment (achy,cramping feeling), bruising, drop of blood, lightheadedness, nausea, sweating. Patient Consent Given: Yes Education handout provided: Yes Muscles treated: R piriformis, R gluteals and R Qaudratus Lumborum  Needle size and number: .30x779mx 5 Electrical stimulation performed: No Parameters: N/A Treatment response/outcome: Twitch response elicited and Palpable decrease in muscle tension Post-treatment instructions: Patient instructed to expect possible mild to moderate muscle soreness later today and/or tomorrow. Patient instructed in methods to reduce muscle soreness and to continue prescribed HEP. If patient was dry needled over the lung field, patient was instructed on signs and symptoms of pneumothorax and, however unlikely, to see immediate medical attention should they occur. Patient was also educated on signs and symptoms of infection and to seek medical attention should they occur. Patient verbalized understanding of these instructions and education.   Modalities: Moist hot pack  Issue initial HEP MET for R SI joint     PATIENT EDUCATION:  Education details: POC, explanation of LEFS, iniital HEP Explanation of findings education on TPDN precautions and aftercare Person educated: Patient Education method: Explanation, Demonstration, Tactile  cues, Verbal cues,  and Handouts Education comprehension: verbalized understanding, returned demonstration, verbal cues required, tactile cues required, and needs further education     HOME EXERCISE PROGRAM: Access Code: [facility] URL: [email or web address] Date: 08/20/2021 Prepared by: Voncille Lo   Program Notes sit in chair with hands under R thigh. Press/resist into hands for hamstring resistance to decrease pain in R Sacral Iliac area   Exercises - Supine Piriformis Stretch with Foot on Ground  - 1-2 x daily - 7 x weekly - 1 sets - 3 reps - 10-15 hold - Supine Figure 4 Piriformis Stretch  - 1-2 x daily - 7 x weekly - 1 sets - 3 reps - 10-15 hold  added 08-27-21 - Clamshell with Resistance  - 1 x daily - 7 x weekly - 3 sets - 10 reps - Supine Bridge with Mini Swiss Ball Between Knees  - 1 x daily - 7 x weekly - 3 sets - 10 reps - Supine Lower Trunk Rotation  - 2 x daily - 7 x weekly - 1 sets - 5 reps - 20 sec  hold  Added 08-30-21 Abdominal setting 10 sec  hold x 10  Isometric Hold with pelvic floor (hooklying) 1 x 10 sec hold pelvic tilt 2 x 10 supine knee march 3 x 10 with abdominal engagement Knee drop 2 x 10 with abd engagement ASSESSMENT:   CLINICAL IMPRESSION: Pt enters clinic with pre school dtr for 3rd visit  with antalgic gait R as she ambulates to clinic room.  Pt with 4/10 pain. Pt RX session with MET and exercises with abdominal engagement emphasis. Pt consented to TPDN and was closely monitored throughout session.  Pt with decreased tissue tension and decreasing limping after RX session.  Pt added to HEP.  Will continue with progressive strength and pain management in future remaining sessions.  EVAL- Patient is a 46 y.o. female who was seen today for physical therapy evaluation and treatment for R SI/ piriformis pain which limits her ability to perform household chores, gardening and riding bicycles with her children.  Pt has decreased mobility in  segmental flexion sacrum at lumbosacral junction.  Pt with limited hip mobility in hip flexion.  Pt has difficulty standing more than 10 minutes or pressing the accelerator of car without pain up to 8/10.  Pt will benefit from skilled PT to address deficits and reduce pain for more comfortable     OBJECTIVE IMPAIRMENTS Abnormal gait, decreased activity tolerance, decreased mobility, decreased ROM, decreased strength, hypomobility, impaired flexibility, improper body mechanics, postural dysfunction, obesity, and pain.    ACTIVITY LIMITATIONS cleaning, driving, meal prep, yard work, and shopping.    PERSONAL FACTORS Fitness are also affecting patient's functional outcome.  Pt has not been able to exercise due to this SI/piriformis pain and would like to return to running eventually but would like to at least return to biking with children     REHAB POTENTIAL: Excellent   CLINICAL DECISION MAKING: Evolving/moderate complexity   EVALUATION COMPLEXITY: Moderate     GOALS: Goals reviewed with patient? Yes   SHORT TERM GOALS: Target date: 09/10/2021   Pt will be independent with initial HEP Baseline:given initial HEP Goal status: ongoing   2.  Pt will reduce pain during  standing to 5/10 from 8/10 and stand for 15 minutes Baseline: Pt with 8/10 pain if walking standing more than 10 minutes 08-30-21 5/10 for 15 to 20 minutes Goal status: ongoing   3.  Pt will be able to press accelerator of  car without exacerbating R SI pain Baseline: Pt with pain pressing accelerator of car 5/10 to 8/10 when R SI irritated Goal status: ongoing   4.  Pt will begin home walking program daily for 15 minutes minimum Baseline: Pt not routinely exercising  now 08-30-21 starting now 10 min stop and then 10 min Goal status: ongoing       LONG TERM GOALS: Target date: 10/01/2021   Pt will be independent with advanced HEP Baseline:  Goal status: INITIAL   2.  Patient will report improved functional level on  LEFS >/= 70/80 in order to allow for return to Baseline: LEFS 36/80 x 100 =45 % Goal status: INITIAL   3.  Pt will be able to ride bikes with children without exacerbating pain greater than 2/10  Baseline: Pt now has pain at rest 4/10 and up to 8/10 with activity Goal status: INITIAL   4.  Pt will be able to get into kneeling positions for gardening Baseline: Pt with pain up to 8/10 and avoids steps and kneeling Goal status: INITIAL   5.  Pt will be consistent with home walking program at least 7000 steps 4 days a week Baseline: No routine exercise at this point Goal status: INITIAL   6.  Pt will increase ER and hip flexion of R LE in order to more easily don clothing and kneel for gardening Baseline: Pt with pain with hip flex past 95 degrees and when kneeling Goal status: INITIAL     PLAN: PT FREQUENCY: 2x/week   PT DURATION: 6 weeks   PLANNED INTERVENTIONS: Therapeutic exercises, Therapeutic activity, Neuromuscular re-education, Gait training, Patient/Family education, Joint mobilization, Dry Needling, Electrical stimulation, Spinal mobilization, Cryotherapy, Moist heat, Taping, and Manual therapy.   PLAN FOR NEXT SESSION: check inflare/outflare SI TPDN     [employer] Authorization    Choose one: Rehabilitative   Standardized Assessment or Functional Outcome Tool: See Pain Assessment and LEFS   Score or Percent Disability: 36/80 x 100  = 45%   Body Parts Treated (Select each separately):  Lumbopelvic. Overall deficits/functional limitations for body part selected: moderate Hip  R  Overall deficits/functional limitations for body part selected Moderate       If treatment provided at initial evaluation, no treatment charged due to lack of authorization.    Voncille Lo, PT, [place] Certified Exercise Expert for the Aging Adult  08/30/21 2:04 PM Phone: [phone] Fax: [place], [place], [place] Certified Exercise Expert for the Aging Adult   10/16/21 7:49 AM Phone: [phone] Fax: [phone]

## 2021-08-30 ENCOUNTER — Ambulatory Visit: Payer: Medicaid Other | Admitting: Physical Therapy

## 2021-08-30 DIAGNOSIS — M25651 Stiffness of right hip, not elsewhere classified: Secondary | ICD-10-CM

## 2021-08-30 DIAGNOSIS — M5441 Lumbago with sciatica, right side: Secondary | ICD-10-CM | POA: Diagnosis not present

## 2021-08-30 DIAGNOSIS — M6281 Muscle weakness (generalized): Secondary | ICD-10-CM | POA: Diagnosis not present

## 2021-08-30 DIAGNOSIS — M7918 Myalgia, other site: Secondary | ICD-10-CM | POA: Diagnosis not present

## 2021-08-30 DIAGNOSIS — G8929 Other chronic pain: Secondary | ICD-10-CM | POA: Diagnosis not present

## 2021-08-30 NOTE — Patient Instructions (Addendum)
? ?  PELVIC TILT ? ?Lie on back, legs bent. Exhale, tilting top of pelvis back, pubic bone up, to flatten lower back. Inhale, rolling pelvis opposite way, top forward, pubic bone down, arch in back. ?Repeat __10__ times. Do __2__ sessions per day. ?Copyright ? [employer]. All rights reserved.  ?  ?Isometric Hold With Pelvic Floor (Hook-Lying) ? ?Lie with hips and knees bent. Slowly inhale, and then exhale. Pull navel toward spine and tighten pelvic floor. Hold for __10_ seconds. Continue to breathe in and out during hold. Rest for _10__ seconds. Repeat __10_ times. Do __2-3_ times a day.  ? ?Knee Fold ? ?Lie on back, legs bent, arms by sides. Exhale, lifting knee to chest. Inhale, returning. Keep abdominals flat, navel to spine. ?Repeat __10__ times, alternating legs. Do __2__ sessions per day.  ?Knee Drop ? ?Keep pelvis stable. Without rotating hips, slowly drop knee to side, pause, return to center, bring knee across midline toward opposite hip. Feel obliques engaging. ?Repeat for ___10_ times each leg.  ? ?Copyright ? V[employer]. All rights reserved.  ? ? ? ?.   ?Garen Lah, PT, ATR[facility] ?Certified Exercise Expert for the Aging Adult  ?08/30/21 10:44 AM ?Phone: [phone] ?Fax: [phone]  ?

## 2021-08-30 NOTE — Therapy (Incomplete)
?OUTPATIENT PHYSICAL THERAPY TREATMENT NOTE ? ? ?Patient Name: Carol Welch ?MRN: [id number] ?DOB:12-31-1975, 46 y.o., female ?Today's Date: 08/30/2021 ? ?PCP: Gladys Damme, MD ?REFERRING PROVIDER: Gladys Damme, MD ? ?END OF SESSION:  ? ? ? ? ?Past Medical History:  ?Diagnosis Date  ? Anal fissure   ? Colitis   ? Depression   ? Edema, lower extremity   ? Fatty liver   ? Hypothyroidism   ? Low blood pressure   ? PONV (postoperative nausea and vomiting)   ? SVD (spontaneous vaginal delivery) 06/30/2015  ? Vitamin D deficiency   ? ?Past Surgical History:  ?Procedure Laterality Date  ? BREAST CYST ASPIRATION Right 02/13/2018  ? CYSTOSCOPY N/A 12/30/2019  ? Procedure: CYSTOSCOPY;  Surgeon: Janyth Contes, MD;  Location: [facility];  Service: Gynecology;  Laterality: N/A;  ? ENDOVENOUS ABLATION SAPHENOUS VEIN W/ LASER Right 10/08/2018  ? endovenous laser ablation right saphenous vein by Deitra Mayo MD  ? LAPAROSCOPIC VAGINAL HYSTERECTOMY WITH SALPINGECTOMY Bilateral 12/30/2019  ? Procedure: LAPAROSCOPIC ASSISTED VAGINAL HYSTERECTOMY WITH SALPINGECTOMY;  Surgeon: Janyth Contes, MD;  Location: [place];  Service: Gynecology;  Laterality: Bilateral;  ? ?Patient Active Problem List  ? Diagnosis Date Noted  ? Class 1 obesity due to excess calories with body mass index (BMI) of 30.0 to 30.9 in adult 08/13/2021  ? Trapezius muscle strain, right, initial encounter 03/20/2021  ? Gastroenteritis 10/03/2020  ? Hand trauma, right, initial encounter 07/18/2020  ? Encounter for surveillance of abnormal nevi 07/09/2020  ? Acquired hypothyroidism 07/07/2020  ? 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 Neck pain 01/18/2020  ? S/P laparoscopic assisted vaginal hysterectomy (LAVH) 12/30/2019  ? External thrombosed hemorrhoids 09/11/2019  ? MDD (major depressive disorder) 08/24/2019  ? Orthostatic hypotension 08/24/2019  ? Hypothyroidism 07/06/2018  ? Family  history of congenital anomaly 08/27/2016  ? ? ?REFERRING DIAG: M79.18 (ICD-10-CM) - Piriformis muscle pain ? ?THERAPY DIAG:  ?No diagnosis found. ? ?PERTINENT HISTORY: Colitis, depression, hypothyroidism, vitamin D deficiency, fatty liver ? ?PRECAUTIONS: none ? ?SUBJECTIVE: *** ? ?I really was sore about 24 hours and then the next day I was feeling so much better. 4/10 today ? ? ? ?PAIN:  ?Are you having pain? Yes: NPRS scale: 4/10 ?Pain location: R piriformis ?Pain description: sharp in r buttock ?Aggravating factors:   ?Relieving factors:   ? ? ?Aggravating factors: walking and standing hurts the most, gardening, standing for longer than 10 min, sometimes pushing on the accelerator,going up and down steps, I also have pain when riding bicycle so I walk instead of ride ?Relieving factors: stretches  using heat at home ?At worst my R side buttock pain ins 8/10  ?OBJECTIVE: (objective measures completed at initial evaluation unless otherwise dated) ? ? ?  ?OBJECTIVE:  ?  ?DIAGNOSTIC FINDINGS:  ?None for Low back ?  ?PATIENT SURVEYS:  ?LEFS 36/80 x 100  45% ?  ?SCREENING FOR RED FLAGS: ?Bowel or bladder incontinence: No ?Spinal tumors: No ?Cauda equina syndrome: No ?Compression fracture: No ?Abdominal aneurysm: No ?  ?COGNITION: ?          Overall cognitive status: Within functional limits for tasks assessed               ?           ?SENSATION: ?WFL ?  ?MUSCLE LENGTH: ?Hamstrings: Right 65 deg; Left 70 deg ?  ?  ?POSTURE:  ?Forward head and rounded shoulder  ?  ?R elevated pelvic level  ?  R ASIS more posterior than L ?  ?PALPATION: ?Limited segmental flexion and lumbo sacral junction ?TTP over R piriformis and over R SI ?Hypomobility lumbar spine ?  ?LUMBAR ROM:  ?  ?Active  A/PROM  ?08/20/2021  ?Flexion Fingertips to bil ankles  ?Extension Pain with ext 20 degrees  ?Right lateral flexion Pain in R SI fingertip to knee jt line  ?Left lateral flexion Pain in R SI with fingertip to knee jt line  ?Right rotation 75% no  pain  ?Left rotation 75% no pain  ? (Blank rows = not tested) ?  ?LE ROM: ?  ?Active  Right ?08/20/2021 Left ?08/20/2021  ?Hip flexion 95 110  ?Hip extension      ?Hip abduction      ?Hip adduction      ?Hip internal rotation 45 47  ?Hip external rotation 35 49  ?Knee flexion      ?Knee extension      ?Ankle dorsiflexion      ?Ankle plantarflexion      ?Ankle inversion      ?Ankle eversion      ? (Blank rows = not tested) ?  ?LE MMT: ?  ?MMT Right ?08/20/2021 Left ?08/20/2021  ?Hip flexion 4- 4+  ?Hip extension 3+ limited by pain R SI 4  ?Hip abduction 3- 4  ?Hip adduction      ?Hip internal rotation 4 4  ?Hip external rotation 4- 4-  ?Knee flexion 5 5  ?Knee extension 5 5  ?Ankle dorsiflexion 5 5  ?Ankle plantarflexion      ?Ankle inversion      ?Ankle eversion      ? (Blank rows = not tested) ?  ?LUMBAR SPECIAL TESTS:  ?Straight leg raise test: Negative, Slump test: Negative, SI Compression/distraction test: Positive, and FABER test: Positive ?Sacral Spring test positive. Lumbo slyacral stiffiness on right . Stiff R hip flexion to 95 degrees active ?FUNCTIONAL TESTS:  ?5 times sit to stand: 9.98 sec ?  ?GAIT: ?Distance walked: 150 ?Assistive device utilized: None ?Level of assistance: Complete Independence ?Comments: Pt with slight antalgic gait on R ?  ?  ?  ?TODAY'S TREATMENT  ?[facility] Adult PT Treatment:                                                DATE: 09-04-21 ?Therapeutic Exercise: ?*** ?Manual Therapy: ?*** ?Neuromuscular re-ed: ?*** ?Therapeutic Activity: ?*** ?Modalities: ?*** ?Self Care: ?***  ?[facility] Adult PT Treatment:                                                DATE: 08-30-21 ?Noted R pelvic level elevated - tightened QL ?Therapeutic Exercise: ?Supine Piriformis Stretch with Foot on Ground  3 reps  30 sec hold R side ?Abdominal setting 10 sec  hold x 10  ?Isometric Hold with pelvic floor (hooklying) 1 x 10 sec hold pelvic tilt 2 x 10 ?supine knee march 3 x 10 with abdominal engagement ?Knee drop 2 x 10 with  abd engagement ?Supine Figure 4 Piriformis Stretch  3 reps - 30 sec hold R side ?Clamshell with Resistance  -3 sets - 10 reps ?Supine Bridge with Mini Swiss Ball Between Knees 3 sets -  10 reps ?Supine Lower Trunk Rotation  5 reps - 20 sec  hold on R and then L ?Manual Therapy: ?STW - to  Right QL and R buttocks/piriformis ? Myofascial release of R QL and for piriformis ? MET for R SI, trunk rotation thrust R rotated forward ?Trigger Point Dry Needling Treatment: ?Pre-treatment instruction: Patient instructed on dry needling rationale, procedures, and possible side effects including pain during treatment (achy,cramping feeling), bruising, drop of blood, lightheadedness, nausea, sweating. ?Patient Consent Given: Yes ?Education handout provided: Yes ?Muscles treated:Right  piriformis, R gluteals and R Quadratus Lumborum  ?Needle size and number: .30x3m x 4 ?Electrical stimulation performed: No ?Parameters: N/A ?Treatment response/outcome: Twitch response elicited and Palpable decrease in muscle tension ?Post-treatment instructions: Patient instructed to expect possible mild to moderate muscle soreness later today and/or tomorrow. Patient instructed in methods to reduce muscle soreness and to continue prescribed HEP. If patient was dry needled over the lung field, patient was instructed on signs and symptoms of pneumothorax and, however unlikely, to see immediate medical attention should they occur. Patient was also educated on signs and symptoms of infection and to seek medical attention should they occur. Patient verbalized understanding of these instructions and education.  ? ?Modalities: ?Moist hot pack ?[facility]                                                [facility]: [facility] ?Therapeutic Exercise: ?-Supine Piriformis Stretch with Foot on Ground  3 reps  30 sec hold R side ?- Supine Figure 4 Piriformis Stretch  3 reps - 30 sec hold R side ?- Clamshell with Resistance  -3 sets - 10 reps ?- Supine  Bridge with Mini Swiss Ball Between Knees 3 sets - 10 reps ?- Supine Lower Trunk Rotation  5 reps - 20 sec  hold on R and then L ?Manual Therapy: ?STW - to  Right QL and R buttocks/piriformis ? Myofascial releas

## 2021-08-31 ENCOUNTER — Ambulatory Visit
Admission: RE | Admit: 2021-08-31 | Discharge: 2021-08-31 | Disposition: A | Discharge status: Home / Self Care | Payer: Medicaid Other | Source: Ambulatory Visit | Attending: Obstetrics and Gynecology | Admitting: Obstetrics and Gynecology

## 2021-08-31 DIAGNOSIS — Z1231 Encounter for screening mammogram for malignant neoplasm of breast: Secondary | ICD-10-CM | POA: Diagnosis not present

## 2021-08-31 DIAGNOSIS — F331 Major depressive disorder, recurrent, moderate: Secondary | ICD-10-CM | POA: Diagnosis not present

## 2021-09-03 DIAGNOSIS — N644 Mastodynia: Secondary | ICD-10-CM | POA: Diagnosis not present

## 2021-09-03 DIAGNOSIS — Z419 Encounter for procedure for purposes other than remedying health state, unspecified: Secondary | ICD-10-CM | POA: Diagnosis not present

## 2021-09-03 DIAGNOSIS — R102 Pelvic and perineal pain: Secondary | ICD-10-CM | POA: Diagnosis not present

## 2021-09-03 DIAGNOSIS — N9411 Superficial (introital) dyspareunia: Secondary | ICD-10-CM | POA: Diagnosis not present

## 2021-09-04 ENCOUNTER — Encounter (INDEPENDENT_AMBULATORY_CARE_PROVIDER_SITE_OTHER): Payer: Self-pay | Admitting: Bariatrics

## 2021-09-04 ENCOUNTER — Ambulatory Visit: Payer: Medicaid Other | Admitting: Physical Therapy

## 2021-09-04 NOTE — Therapy (Signed)
?OUTPATIENT PHYSICAL THERAPY TREATMENT NOTE ? ? ?Patient Name: Carol Welch ?MRN: [id number] ?DOB:May 16, 1975, 46 y.o., female ?Today's Date: 09/06/2021 ? ?PCP: Gladys Damme, MD ?REFERRING PROVIDER: Gladys Damme, MD ? ?END OF SESSION:  ? PT End of Session - 09/06/21 0901   ? ? Visit Number 4   ? Number of Visits 13   ? Date for PT Re-Evaluation 10/01/21   ? Authorization Type [employer]  No tx, estimunattended, VASO or ionto   ? PT Start Time [phone]   ? PT Stop Time 0930   ? PT Time Calculation (min) 39 min   ? Activity Tolerance Patient tolerated treatment well   ? Behavior During Therapy [facility] for tasks assessed/performed   ? ?  ?  ? ?  ? ? ? ? ?Past Medical History:  ?Diagnosis Date  ? Anal fissure   ? Colitis   ? Depression   ? Edema, lower extremity   ? Fatty liver   ? Hypothyroidism   ? Low blood pressure   ? PONV (postoperative nausea and vomiting)   ? SVD (spontaneous vaginal delivery) 06/30/2015  ? Vitamin D deficiency   ? ?Past Surgical History:  ?Procedure Laterality Date  ? BREAST CYST ASPIRATION Right 02/13/2018  ? CYSTOSCOPY N/A 12/30/2019  ? Procedure: CYSTOSCOPY;  Surgeon: Janyth Contes, MD;  Location: [facility];  Service: Gynecology;  Laterality: N/A;  ? ENDOVENOUS ABLATION SAPHENOUS VEIN W/ LASER Right 10/08/2018  ? endovenous laser ablation right saphenous vein by Deitra Mayo MD  ? LAPAROSCOPIC VAGINAL HYSTERECTOMY WITH SALPINGECTOMY Bilateral 12/30/2019  ? Procedure: LAPAROSCOPIC ASSISTED VAGINAL HYSTERECTOMY WITH SALPINGECTOMY;  Surgeon: Janyth Contes, MD;  Location: [place];  Service: Gynecology;  Laterality: Bilateral;  ? ?Patient Active Problem List  ? Diagnosis Date Noted  ? Class 1 obesity due to excess calories with body mass index (BMI) of 30.0 to 30.9 in adult 08/13/2021  ? Trapezius muscle strain, right, initial encounter 03/20/2021  ? Gastroenteritis 10/03/2020  ? Hand trauma, right, initial encounter 07/18/2020  ?  Encounter for surveillance of abnormal nevi 07/09/2020  ? Acquired hypothyroidism 07/07/2020  ? 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 Neck pain 01/18/2020  ? S/P laparoscopic assisted vaginal hysterectomy (LAVH) 12/30/2019  ? External thrombosed hemorrhoids 09/11/2019  ? MDD (major depressive disorder) 08/24/2019  ? Orthostatic hypotension 08/24/2019  ? Hypothyroidism 07/06/2018  ? Family history of congenital anomaly 08/27/2016  ? ? ?REFERRING DIAG: M79.18 (ICD-10-CM) - Piriformis muscle pain ? ?THERAPY DIAG:  ?Chronic right-sided low back pain with right-sided sciatica ? ?Muscle weakness (generalized) ? ?Stiffness of right hip, not elsewhere classified ? ?PERTINENT HISTORY: Colitis, depression, hypothyroidism, vitamin D deficiency, fatty liver ? ?PRECAUTIONS: none ? ?SUBJECTIVE: I am really sore with the TPDN. I would like to just do exercise today. I have trouble getting in and out of my husbands big truck and I have pain ? ? ? ? ? ?PAIN:  ?Are you having pain? Yes: NPRS scale: 4/10 ?Pain location: R piriformis ?Pain description: sharp in r buttock ?Aggravating factors:   ?Relieving factors:   ? ? ?Aggravating factors: walking and standing hurts the most, gardening, standing for longer than 10 min, sometimes pushing on the accelerator,going up and down steps, I also have pain when riding bicycle so I walk instead of ride ?Relieving factors: stretches  using heat at home ?At worst my R side buttock pain ins 8/10  ?OBJECTIVE: (objective measures completed at initial evaluation unless otherwise dated) ? ? ?  ?  OBJECTIVE:  ?  ?DIAGNOSTIC FINDINGS:  ?None for Low back ?  ?PATIENT SURVEYS:  ?LEFS 36/80 x 100  45% ?  ?SCREENING FOR RED FLAGS: ?Bowel or bladder incontinence: No ?Spinal tumors: No ?Cauda equina syndrome: No ?Compression fracture: No ?Abdominal aneurysm: No ?  ?COGNITION: ?          Overall cognitive status: Within functional limits for tasks assessed               ?            ?SENSATION: ?WFL ?  ?MUSCLE LENGTH: ?Hamstrings: Right 65 deg; Left 70 deg ?  ?  ?POSTURE:  ?Forward head and rounded shoulder  ?  ?R elevated pelvic level  ?R ASIS more posterior than L ?  ?PALPATION: ?Limited segmental flexion and lumbo sacral junction ?TTP over R piriformis and over R SI ?Hypomobility lumbar spine ?  ?LUMBAR ROM:  ?  ?Active  A/PROM  ?08/20/2021  ?Flexion Fingertips to bil ankles  ?Extension Pain with ext 20 degrees  ?Right lateral flexion Pain in R SI fingertip to knee jt line  ?Left lateral flexion Pain in R SI with fingertip to knee jt line  ?Right rotation 75% no pain  ?Left rotation 75% no pain  ? (Blank rows = not tested) ?  ?LE ROM: ?  ?Active  Right ?08/20/2021 Left ?08/20/2021  ?Hip flexion 95 110  ?Hip extension      ?Hip abduction      ?Hip adduction      ?Hip internal rotation 45 47  ?Hip external rotation 35 49  ?Knee flexion      ?Knee extension      ?Ankle dorsiflexion      ?Ankle plantarflexion      ?Ankle inversion      ?Ankle eversion      ? (Blank rows = not tested) ?  ?LE MMT: ?  ?MMT Right ?08/20/2021 Left ?08/20/2021  ?Hip flexion 4- 4+  ?Hip extension 3+ limited by pain R SI 4  ?Hip abduction 3- 4  ?Hip adduction      ?Hip internal rotation 4 4  ?Hip external rotation 4- 4-  ?Knee flexion 5 5  ?Knee extension 5 5  ?Ankle dorsiflexion 5 5  ?Ankle plantarflexion      ?Ankle inversion      ?Ankle eversion      ? (Blank rows = not tested) ?  ?LUMBAR SPECIAL TESTS:  ?Straight leg raise test: Negative, Slump test: Negative, SI Compression/distraction test: Positive, and FABER test: Positive ?Sacral Spring test positive. Lumbo slyacral stiffiness on right . Stiff R hip flexion to 95 degrees active ?FUNCTIONAL TESTS:  ?5 times sit to stand: 9.98 sec ?  ?GAIT: ?Distance walked: 150 ?Assistive device utilized: None ?Level of assistance: Complete Independence ?Comments: Pt with slight antalgic gait on R ?  ?  ?  ?TODAY'S TREATMENT  ?[facility] Adult PT Treatment:                                                 DATE: 09-06-21 ?Therapeutic Exercise: ? ?Recmbent bike 6 min level 3 RPE 5/6 ?Hip Hinge with Dowel x 15 ?Hip Hinge with External cue of wall x 15  ?Deadlift  15 # KB on 8 inch elevated box x 15 ?Deadlift 15 # KB facing 8 in elevated box  x 15 ?Box squat 15 # KB 2 x 10 ?Mat 19 inches tall, Step ups with mod a with PT in one minute Left 11 x and R 13 x ?Ardine Eng pose exercise forward and rocking to side ? ?Self Care: ?Taught self mob of R SI by sitting with left leg max hip flex/knee flex and rotating trunk to R ?Education on proper lifting ?Resisted hamstring on R with dowel rod and return demo ?  ?[facility] Adult PT Treatment:                                                DATE: 08-30-21 ?Noted R pelvic level elevated - tightened QL ?Therapeutic Exercise: ?Supine Piriformis Stretch with Foot on Ground  3 reps  30 sec hold R side ?Abdominal setting 10 sec  hold x 10  ?Isometric Hold with pelvic floor (hooklying) 1 x 10 sec hold pelvic tilt 2 x 10 ?supine knee march 3 x 10 with abdominal engagement ?Knee drop 2 x 10 with abd engagement ?Supine Figure 4 Piriformis Stretch  3 reps - 30 sec hold R side ?Clamshell with Resistance  -3 sets - 10 reps ?Supine Bridge with Mini Swiss Ball Between Knees 3 sets - 10 reps ?Supine Lower Trunk Rotation  5 reps - 20 sec  hold on R and then L ?Manual Therapy: ?STW - to  Right QL and R buttocks/piriformis ? Myofascial release of R QL and for piriformis ? MET for R SI, trunk rotation thrust R rotated forward ?Trigger Point Dry Needling Treatment: ?Pre-treatment instruction: Patient instructed on dry needling rationale, procedures, and possible side effects including pain during treatment (achy,cramping feeling), bruising, drop of blood, lightheadedness, nausea, sweating. ?Patient Consent Given: Yes ?Education handout provided: Yes ?Muscles treated:Right  piriformis, R gluteals and R Quadratus Lumborum  ?Needle size and number: .30x16m x 4 ?Electrical stimulation performed:  No ?Parameters: N/A ?Treatment response/outcome: Twitch response elicited and Palpable decrease in muscle tension ?Post-treatment instructions: Patient instructed to expect possible mild to moderate muscle soreness later t

## 2021-09-04 NOTE — Progress Notes (Signed)
? ? ? ?Chief Complaint:  ? ?OBESITY ?Carol Welch is here to discuss her progress with her obesity treatment plan along with follow-up of her obesity related diagnoses. Carol Welch is on the Category 1 Plan and states she is following her eating plan approximately 95% of the time. Carol Welch states she is walking 30-45 minutes 4-5 times per week. ? ?Today's visit was #: 2 ?Starting weight: 167 lbs ?Starting date: 08/08/2021 ?Today's weight: 165 lbs ?Today's date: 08/23/2021 ?Total lbs lost to date: 2 lbs ?Total lbs lost since last in-office visit: 2 lbs ? ?Interim History: Carol Welch is down 2 lbs since her first visit. She denies any struggles, she denies polyphagia ? ?Subjective:  ? ?1. Acquired hypothyroidism ?Carol Welch is currently taking Synthroid 75 mcg daily. ? ?2. Vitamin D insufficiency ?Carol Welch is currently taking multivitamin with minerals.  Her Vitamin D level is 37.3 ? ?Assessment/Plan:  ? ?1. Acquired hypothyroidism ?Carol Welch will continue taking Synthroid 75 mcg daily. ? ?2. Vitamin D insufficiency ?Low Vitamin D level contributes to fatigue and are associated with obesity, breast, and colon cancer. She agrees to continue to take prescription Vitamin D @50 ,000 IU every week and will follow-up for routine testing of Vitamin D, at least 2-3 times per year to avoid over-replacement. ?- Vitamin D, Ergocalciferol, (DRISDOL) 1.25 MG (50000 UNIT) CAPS capsule; Take 1 capsule (50,000 Units total) by mouth every 7 (seven) days.  Dispense: 5 capsule; Refill: 0 ? ?3. Obesity, current BMI 30.3 ?Carol Welch is currently in the action stage of change. As such, her goal is to continue with weight loss efforts. She has agreed to the Category 1 Plan.  ? ?Carol Welch agreed to meal planning.  ?Labs reviewed from  08/08/2021 (Vit D, B 12,A1c & Insulin). ?Continue to drink adequate water. ? ?Exercise goals: As is. ? ?Behavioral modification strategies: increasing lean protein intake, decreasing simple carbohydrates, increasing vegetables, increasing  water intake, decreasing eating out, no skipping meals, meal planning and cooking strategies, keeping healthy foods in the home, and planning for success. ? ?Carol Welch has agreed to follow-up with our clinic in 2-3 weeks. She was informed of the importance of frequent follow-up visits to maximize her success with intensive lifestyle modifications for her multiple health conditions.  ? ?Objective:  ? ?Blood pressure 112/73, pulse (!) 57, temperature 98.1 ?F (36.7 ?C), height 5\' 2"  (1.575 m), weight 165 lb (74.8 kg), last menstrual period 12/19/2019, SpO2 98 %. ?Body mass index is 30.18 kg/m?. ? ?General: Cooperative, alert, well developed, in no acute distress. ?HEENT: Conjunctivae and lids unremarkable. ?Cardiovascular: Regular rhythm.  ?Lungs: Normal work of breathing. ?Neurologic: No focal deficits.  ? ?Lab Results  ?Component Value Date  ? CREATININE 0.62 03/13/2021  ? BUN 14 03/13/2021  ? NA 136 03/13/2021  ? K 4.2 03/13/2021  ? CL 104 03/13/2021  ? CO2 24 03/13/2021  ? ?Lab Results  ?Component Value Date  ? ALT 10 05/29/2021  ? AST 15 05/29/2021  ? ALKPHOS 48 05/29/2021  ? BILITOT 0.2 05/29/2021  ? ?Lab Results  ?Component Value Date  ? HGBA1C 5.6 08/08/2021  ? ?Lab Results  ?Component Value Date  ? INSULIN 5.7 08/08/2021  ? ?Lab Results  ?Component Value Date  ? TSH 1.35 03/20/2021  ? ?No results found for: CHOL, HDL, LDLCALC, LDLDIRECT, TRIG, CHOLHDL ?Lab Results  ?Component Value Date  ? VD25OH 37.3 08/08/2021  ? VD25OH 41.65 12/05/2020  ? ?Lab Results  ?Component Value Date  ? WBC 6.0 05/29/2021  ? HGB 13.3 05/29/2021  ?  HCT 38.9 05/29/2021  ? MCV 88 05/29/2021  ? PLT 250 05/29/2021  ? ?No results found for: IRON, TIBC, FERRITIN ? ?I, Paulla Fore, CMA, am acting as transcriptionist for Dr. Corinna Capra, DO.  ? ?I have reviewed the above documentation for accuracy and completeness, and I agree with the above. Corinna Capra, DO ? ?

## 2021-09-05 DIAGNOSIS — F331 Major depressive disorder, recurrent, moderate: Secondary | ICD-10-CM | POA: Diagnosis not present

## 2021-09-05 NOTE — Progress Notes (Signed)
?TeleHealth Visit:  ?This visit was completed with telemedicine (audio/video) technology. ?Carol Welch has verbally consented to this TeleHealth visit. The patient is located at home, the provider is located at home. The participants in this visit include the listed provider and patient. The visit was conducted today via MyChart video. ? ?OBESITY ?Carol Welch is here to discuss her progress with her obesity treatment plan along with follow-up of her obesity related diagnoses.  ? ?Today's visit was # 3 ?Starting weight: 167 lbs ?Starting date: 08/08/2021 ?Weight at last in office visit: 165 lbs on 08/23/21 ?Total weight loss: 2 lbs at last in office visit on 08/23/21. ?Today's reported weight:  No weight reported. ? ?Nutrition Plan: the Category 1 Plan.  ?Hunger is poorly controlled. Cravings are well controlled.  ?Current exercise: Carol Welch states she is walking 30-45 minutes 5-6 times per week. ? ?The patient showed up at the office today because she was not aware she was having a virtual visit. ? ?Interim History: Carol Welch reports that she is having a lot of hunger, especially between breakfast and lunch.  She is only eating about 2 ounces of meat at lunch with her vegetables.  She also has about 3 slices of avocado with lunch.  At dinner she usually has only baked vegetables.  She occasionally has a little bit of meat.  She notes that meat hurts her stomach.  She is working on increasing her water intake and typically has 6 to 8 cups of water per day. ?Before starting our program she frequently skipped meals. ? ?Assessment/Plan:  ?1. Insulin Resistance ?Carol Welch has had mildly elevated fasting insulin readings. Goal is HgbA1c < 5.7, fasting insulin closer to 5.  A1c is borderline prediabetic. ?She reports  polyphagia. ?Medication(s): None ?Lab Results  ?Component Value Date  ? HGBA1C 5.6 08/08/2021  ? ?Lab Results  ?Component Value Date  ? INSULIN 5.7 08/08/2021  ? ? ?Plan ?Start metformin 500 mg daily with  breakfast. ? ?2. Vitamin D Deficiency ?Vitamin D is not at goal of 50. She is on weekly prescription Vitamin D 50,000 IU.  ?Lab Results  ?Component Value Date  ? VD25OH 37.3 08/08/2021  ? VD25OH 41.65 12/05/2020  ? ? ?Plan: ?Continue prescription vitamin D 50,000 IU weekly. ? ?3. Obesity: Current BMI 30.17 ?Carol Welch is currently in the action stage of change. As such, her goal is to continue with weight loss efforts.  ?She has agreed to the Category 1 Plan.  ? ?Exercise goals: Continue current exercise regimen. ? ?Behavioral modification strategies: increasing lean protein intake. ? ?Pt advised: ?At lunch have a yogurt or 1/2 cup of cottage cheese with your meat and vegetables. ?At dinner, if you do not have any meat, have 1 whole cup of cottage cheese. ?If you have a very small portion of meat, have 1/2 cup of cottage cheese or yogurt. ?Avoid avocado since they are higher in calories ? ?Carol Welch has agreed to follow-up with our clinic in 2 weeks.  ? ?No orders of the defined types were placed in this encounter. ? ? ?There are no discontinued medications.  ? ?Meds ordered this encounter  ?Medications  ? metFORMIN (GLUCOPHAGE) 500 MG tablet  ?  Sig: Take 1 tablet (500 mg total) by mouth daily with breakfast.  ?  Dispense:  30 tablet  ?  Refill:  3  ?  Order Specific Question:   Supervising Provider  ?  Answer:   Quillian Quince D [[id number]]  ?   ? ?Objective:  ? ?VITALS: Per  patient if applicable, see vitals. ?GENERAL: Alert and in no acute distress. ?CARDIOPULMONARY: No increased WOB. Speaking in clear sentences.  ?PSYCH: Pleasant and cooperative. Speech normal rate and rhythm. Affect is appropriate. Insight and judgement are appropriate. Attention is focused, linear, and appropriate.  ?NEURO: Oriented as arrived to appointment on time with no prompting.  ? ?Lab Results  ?Component Value Date  ? CREATININE 0.62 03/13/2021  ? BUN 14 03/13/2021  ? NA 136 03/13/2021  ? K 4.2 03/13/2021  ? CL 104 03/13/2021  ? CO2 24  03/13/2021  ? ?Lab Results  ?Component Value Date  ? ALT 10 05/29/2021  ? AST 15 05/29/2021  ? ALKPHOS 48 05/29/2021  ? BILITOT 0.2 05/29/2021  ? ?Lab Results  ?Component Value Date  ? HGBA1C 5.6 08/08/2021  ? ?Lab Results  ?Component Value Date  ? INSULIN 5.7 08/08/2021  ? ?Lab Results  ?Component Value Date  ? TSH 1.35 03/20/2021  ? ?No results found for: CHOL, HDL, LDLCALC, LDLDIRECT, TRIG, CHOLHDL ?Lab Results  ?Component Value Date  ? WBC 6.0 05/29/2021  ? HGB 13.3 05/29/2021  ? HCT 38.9 05/29/2021  ? MCV 88 05/29/2021  ? PLT 250 05/29/2021  ? ?No results found for: IRON, TIBC, FERRITIN ?Lab Results  ?Component Value Date  ? VD25OH 37.3 08/08/2021  ? VD25OH 41.65 12/05/2020  ? ? ?Attestation Statements:  ? ?Reviewed by clinician on day of visit: allergies, medications, problem list, medical history, surgical history, family history, social history, and previous encounter notes. ? ? ?

## 2021-09-06 ENCOUNTER — Encounter (INDEPENDENT_AMBULATORY_CARE_PROVIDER_SITE_OTHER): Payer: Self-pay | Admitting: Family Medicine

## 2021-09-06 ENCOUNTER — Telehealth (INDEPENDENT_AMBULATORY_CARE_PROVIDER_SITE_OTHER): Payer: Medicaid Other | Admitting: Family Medicine

## 2021-09-06 ENCOUNTER — Ambulatory Visit: Payer: Medicaid Other | Attending: Family Medicine | Admitting: Physical Therapy

## 2021-09-06 DIAGNOSIS — E559 Vitamin D deficiency, unspecified: Secondary | ICD-10-CM

## 2021-09-06 DIAGNOSIS — E669 Obesity, unspecified: Secondary | ICD-10-CM

## 2021-09-06 DIAGNOSIS — E8881 Metabolic syndrome: Secondary | ICD-10-CM

## 2021-09-06 DIAGNOSIS — Z6830 Body mass index (BMI) 30.0-30.9, adult: Secondary | ICD-10-CM

## 2021-09-06 DIAGNOSIS — M5441 Lumbago with sciatica, right side: Secondary | ICD-10-CM | POA: Diagnosis not present

## 2021-09-06 DIAGNOSIS — M25651 Stiffness of right hip, not elsewhere classified: Secondary | ICD-10-CM | POA: Insufficient documentation

## 2021-09-06 DIAGNOSIS — E88819 Insulin resistance, unspecified: Secondary | ICD-10-CM

## 2021-09-06 DIAGNOSIS — G8929 Other chronic pain: Secondary | ICD-10-CM | POA: Diagnosis not present

## 2021-09-06 DIAGNOSIS — M6281 Muscle weakness (generalized): Secondary | ICD-10-CM | POA: Diagnosis not present

## 2021-09-06 MED ORDER — METFORMIN HCL 500 MG PO TABS: 500.0000 mg | ORAL_TABLET | Freq: Every day | ORAL | 3 refills | Status: DC

## 2021-09-06 NOTE — Therapy (Incomplete)
?OUTPATIENT PHYSICAL THERAPY TREATMENT NOTE ? ? ?Patient Name: Carol Welch ?MRN: [id number] ?DOB:Apr 26, 1976, 46 y.o., female ?Today's Date: 09/06/2021 ? ?PCP: Gladys Damme, MD ?REFERRING PROVIDER: Kinnie Feil, MD ? ?END OF SESSION:  ? ? ? ? ? ?Past Medical History:  ?Diagnosis Date  ? Anal fissure   ? Colitis   ? Depression   ? Edema, lower extremity   ? Fatty liver   ? Hypothyroidism   ? Low blood pressure   ? PONV (postoperative nausea and vomiting)   ? SVD (spontaneous vaginal delivery) 06/30/2015  ? Vitamin D deficiency   ? ?Past Surgical History:  ?Procedure Laterality Date  ? BREAST CYST ASPIRATION Right 02/13/2018  ? CYSTOSCOPY N/A 12/30/2019  ? Procedure: CYSTOSCOPY;  Surgeon: Janyth Contes, MD;  Location: [facility];  Service: Gynecology;  Laterality: N/A;  ? ENDOVENOUS ABLATION SAPHENOUS VEIN W/ LASER Right 10/08/2018  ? endovenous laser ablation right saphenous vein by Deitra Mayo MD  ? LAPAROSCOPIC VAGINAL HYSTERECTOMY WITH SALPINGECTOMY Bilateral 12/30/2019  ? Procedure: LAPAROSCOPIC ASSISTED VAGINAL HYSTERECTOMY WITH SALPINGECTOMY;  Surgeon: Janyth Contes, MD;  Location: [facility];  Service: Gynecology;  Laterality: Bilateral;  ? ?Patient Active Problem List  ? Diagnosis Date Noted  ? Class 1 obesity due to excess calories with body mass index (BMI) of 30.0 to 30.9 in adult 08/13/2021  ? Trapezius muscle strain, right, initial encounter 03/20/2021  ? Gastroenteritis 10/03/2020  ? Hand trauma, right, initial encounter 07/18/2020  ? Encounter for surveillance of abnormal nevi 07/09/2020  ? Acquired hypothyroidism 07/07/2020  ? 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 Neck pain 01/18/2020  ? S/P laparoscopic assisted vaginal hysterectomy (LAVH) 12/30/2019  ? External thrombosed hemorrhoids 09/11/2019  ? MDD (major depressive disorder) 08/24/2019  ? Orthostatic hypotension 08/24/2019  ? Hypothyroidism 07/06/2018  ? Family  history of congenital anomaly 08/27/2016  ? ? ?REFERRING DIAG: M79.18 (ICD-10-CM) - Piriformis muscle pain ? ?THERAPY DIAG:  ?No diagnosis found. ? ?PERTINENT HISTORY: Colitis, depression, hypothyroidism, vitamin D deficiency, fatty liver ? ?PRECAUTIONS: none ? ?SUBJECTIVE:*** ? ? I am really sore with the TPDN. I would like to just do exercise today. I have trouble getting in and out of my husbands big truck and I have pain ? ? ? ? ? ?PAIN:  ?Are you having pain? Yes: NPRS scale: 4/10 ?Pain location: R piriformis ?Pain description: sharp in r buttock ?Aggravating factors:   ?Relieving factors:   ? ? ?Aggravating factors: walking and standing hurts the most, gardening, standing for longer than 10 min, sometimes pushing on the accelerator,going up and down steps, I also have pain when riding bicycle so I walk instead of ride ?Relieving factors: stretches  using heat at home ?At worst my R side buttock pain ins 8/10  ? ?OBJECTIVE: (objective measures completed at initial evaluation unless otherwise dated) ? ? ?  ?OBJECTIVE:  ?  ?DIAGNOSTIC FINDINGS:  ?None for Low back ?  ?PATIENT SURVEYS:  ?LEFS 36/80 x 100  45% ?  ?SCREENING FOR RED FLAGS: ?Bowel or bladder incontinence: No ?Spinal tumors: No ?Cauda equina syndrome: No ?Compression fracture: No ?Abdominal aneurysm: No ?  ?COGNITION: ?          Overall cognitive status: Within functional limits for tasks assessed               ?           ?SENSATION: ?WFL ?  ?MUSCLE LENGTH: ?Hamstrings: Right 65 deg; Left 70 deg ?  ?  ?  POSTURE:  ?Forward head and rounded shoulder  ?  ?R elevated pelvic level  ?R ASIS more posterior than L ?  ?PALPATION: ?Limited segmental flexion and lumbo sacral junction ?TTP over R piriformis and over R SI ?Hypomobility lumbar spine ?  ?LUMBAR ROM:  ?  ?Active  A/PROM  ?08/20/2021  ?Flexion Fingertips to bil ankles  ?Extension Pain with ext 20 degrees  ?Right lateral flexion Pain in R SI fingertip to knee jt line  ?Left lateral flexion Pain in R SI  with fingertip to knee jt line  ?Right rotation 75% no pain  ?Left rotation 75% no pain  ? (Blank rows = not tested) ?  ?LE ROM: ?  ?Active  Right ?08/20/2021 Left ?08/20/2021  ?Hip flexion 95 110  ?Hip extension      ?Hip abduction      ?Hip adduction      ?Hip internal rotation 45 47  ?Hip external rotation 35 49  ?Knee flexion      ?Knee extension      ?Ankle dorsiflexion      ?Ankle plantarflexion      ?Ankle inversion      ?Ankle eversion      ? (Blank rows = not tested) ?  ?LE MMT: ?  ?MMT Right ?08/20/2021 Left ?08/20/2021  ?Hip flexion 4- 4+  ?Hip extension 3+ limited by pain R SI 4  ?Hip abduction 3- 4  ?Hip adduction      ?Hip internal rotation 4 4  ?Hip external rotation 4- 4-  ?Knee flexion 5 5  ?Knee extension 5 5  ?Ankle dorsiflexion 5 5  ?Ankle plantarflexion      ?Ankle inversion      ?Ankle eversion      ? (Blank rows = not tested) ?  ?LUMBAR SPECIAL TESTS:  ?Straight leg raise test: Negative, Slump test: Negative, SI Compression/distraction test: Positive, and FABER test: Positive ?S[place] test positive. Lumbo slyacral stiffiness on right . Stiff R hip flexion to 95 degrees active ?FUNCTIONAL TESTS:  ?5 times sit to stand: 9.98 sec ?  ?GAIT: ?Distance walked: 150 ?Assistive device utilized: None ?Level of assistance: Complete Independence ?Comments: Pt with slight antalgic gait on R ?  ?  ?  ?TODAY'S TREATMENT  ? ?[facility] Adult PT Treatment:                                                DATE: 09-11-21 ?Therapeutic Exercise: ?*** ?Manual Therapy: ?*** ?Neuromuscular re-ed: ?*** ?Therapeutic Activity: ?*** ?Modalities: ?*** ?Self Care: ?***  ?[facility] Adult PT Treatment:                                                DATE: 09-06-21 ?Therapeutic Exercise: ? ?Recmbent bike 6 min level 3 RPE 5/6 ?Hip Hinge with Dowel x 15 ?Hip Hinge with External cue of wall x 15  ?Deadlift  15 # KB on 8 inch elevated box x 15 ?Deadlift 15 # KB facing 8 in elevated box x 15 ?Box squat 15 # KB 2 x 10 ?Mat 19 inches tall, Step  ups with mod a with PT in one minute Left 11 x and R 13 x ?Childs pose exercise forward and rocking to  side ? ?Self Care: ?Taught self mob of R SI by sitting with left leg max hip flex/knee flex and rotating trunk to R ?Education on proper lifting ?Resisted hamstring on R with dowel rod and return demo ?  ?[facility] Adult PT Treatment:                                                DATE: 08-30-21 ?Noted R pelvic level elevated - tightened QL ?Therapeutic Exercise: ?Supine Piriformis Stretch with Foot on Ground  3 reps  30 sec hold R side ?Abdominal setting 10 sec  hold x 10  ?Isometric Hold with pelvic floor (hooklying) 1 x 10 sec hold pelvic tilt 2 x 10 ?supine knee march 3 x 10 with abdominal engagement ?Knee drop 2 x 10 with abd engagement ?Supine Figure 4 Piriformis Stretch  3 reps - 30 sec hold R side ?Clamshell with Resistance  -3 sets - 10 reps ?Supine Bridge with Mini Swiss Ball Between Knees 3 sets - 10 reps ?Supine Lower Trunk Rotation  5 reps - 20 sec  hold on R and then L ?Manual Therapy: ?STW - to  Right QL and R buttocks/piriformis ? Myofascial release of R QL and for piriformis ? MET for R SI, trunk rotation thrust R rotated forward ?Trigger Point Dry Needling Treatment: ?Pre-treatment instruction: Patient instructed on dry needling rationale, procedures, and possible side effects including pain during treatment (achy,cramping feeling), bruising, drop of blood, lightheadedness, nausea, sweating. ?Patient Consent Given: Yes ?Education handout provided: Yes ?Muscles treated:Right  piriformis, R gluteals and R Quadratus Lumborum  ?Needle size and number: .30x48m x 4 ?Electrical stimulation performed: No ?Parameters: N/A ?Treatment response/outcome: Twitch response elicited and Palpable decrease in muscle tension ?Post-treatment instructions: Patient instructed to expect possible mild to moderate muscle soreness later today and/or tomorrow. Patient instructed in methods to reduce muscle soreness and to  continue prescribed HEP. If patient was dry needled over the lung field, patient was instructed on signs and symptoms of pneumothorax and, however unlikely, to see immediate medical attention should they occur. P

## 2021-09-11 ENCOUNTER — Ambulatory Visit: Payer: Medicaid Other | Admitting: Physical Therapy

## 2021-09-11 ENCOUNTER — Telehealth: Payer: Self-pay | Admitting: Physical Therapy

## 2021-09-11 NOTE — Therapy (Addendum)
OUTPATIENT PHYSICAL THERAPY TREATMENT NOTE   Patient Name: Carol Welch MRN: [id number] DOB:1975-05-11, 46 y.o., female Today's Date: 09/13/2021  PCP: Gladys Damme, MD REFERRING PROVIDER: Kinnie Feil, MD  END OF SESSION:   PT End of Session - 09/13/21 0908     Visit Number 5    Number of Visits 13    Date for PT Re-Evaluation 10/01/21    Authorization Type [employer]  No tx, estimunattended, VASO or ionto    PT Start Time 0903    PT Stop Time 0930    PT Time Calculation (min) 27 min    Activity Tolerance Patient tolerated treatment well    Behavior During Therapy WFL for tasks assessed/performed                Past Medical History:  Diagnosis Date   Anal fissure    Colitis    Depression    Edema, lower extremity    Fatty liver    Hypothyroidism    Low blood pressure    PONV (postoperative nausea and vomiting)    SVD (spontaneous vaginal delivery) 06/30/2015   Vitamin D deficiency    Past Surgical History:  Procedure Laterality Date   BREAST CYST ASPIRATION Right 02/13/2018   CYSTOSCOPY N/A 12/30/2019   Procedure: CYSTOSCOPY;  Surgeon: Janyth Contes, MD;  Location: [facility];  Service: Gynecology;  Laterality: N/A;   ENDOVENOUS ABLATION SAPHENOUS VEIN W/ LASER Right 10/08/2018   endovenous laser ablation right saphenous vein by Deitra Mayo MD   LAPAROSCOPIC VAGINAL HYSTERECTOMY WITH SALPINGECTOMY Bilateral 12/30/2019   Procedure: LAPAROSCOPIC ASSISTED VAGINAL HYSTERECTOMY WITH SALPINGECTOMY;  Surgeon: Janyth Contes, MD;  Location: [place];  Service: Gynecology;  Laterality: Bilateral;   Patient Active Problem List   Diagnosis Date Noted   Class 1 obesity due to excess calories with body mass index (BMI) of 30.0 to 30.9 in adult 08/13/2021   Trapezius muscle strain, right, initial encounter 03/20/2021   Gastroenteritis 10/03/2020   Hand trauma, right, initial encounter 07/18/2020    Encounter for surveillance of abnormal nevi 07/09/2020   Acquired hypothyroidism 07/07/2020   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   Neck pain 01/18/2020   S/P laparoscopic assisted vaginal hysterectomy (LAVH) 12/30/2019   External thrombosed hemorrhoids 09/11/2019   MDD (major depressive disorder) 08/24/2019   Orthostatic hypotension 08/24/2019   Hypothyroidism 07/06/2018   Family history of congenital anomaly 08/27/2016    REFERRING DIAG: M79.18 (ICD-10-CM) - Piriformis muscle pain  THERAPY DIAG:  Chronic right-sided low back pain with right-sided sciatica   Stiffness of right hip, not elsewhere classified  PERTINENT HISTORY: Colitis, depression, hypothyroidism, vitamin D deficiency, fatty liver  PRECAUTIONS: none  SUBJECTIVE: I am about a 3/10 in R SI. I noticed that it hurt when I was helping my husband in the yard.  When I get up and I had pinch then. But I got a little better.  I would like to do the TPDN today.        PAIN:  Are you having pain? Yes: NPRS scale: 3/10 Pain location: R piriformis Pain description: sharp in r buttock Aggravating factors:   Relieving factors:     Aggravating factors: walking and standing hurts the most, gardening, standing for longer than 10 min, sometimes pushing on the accelerator,going up and down steps, I also have pain when riding bicycle so I walk instead of ride Relieving factors: stretches  using heat at home At worst my R side buttock pain  ins 8/10  OBJECTIVE: (objective measures completed at initial evaluation unless otherwise dated)     OBJECTIVE:    DIAGNOSTIC FINDINGS:  None for Low back   PATIENT SURVEYS:  LEFS 36/80 x 100  45%   SCREENING FOR RED FLAGS: Bowel or bladder incontinence: No Spinal tumors: No Cauda equina syndrome: No Compression fracture: No Abdominal aneurysm: No   COGNITION:           Overall cognitive status: Within functional limits for tasks assessed                           SENSATION: WFL   MUSCLE LENGTH: Hamstrings: Right 65 deg; Left 70 deg     POSTURE:  Forward head and rounded shoulder    R elevated pelvic level  R ASIS more posterior than L   PALPATION: Limited segmental flexion and lumbo sacral junction TTP over R piriformis and over R SI Hypomobility lumbar spine   LUMBAR ROM:    Active  A/PROM  08/20/2021  Flexion Fingertips to bil ankles  Extension Pain with ext 20 degrees  Right lateral flexion Pain in R SI fingertip to knee jt line  Left lateral flexion Pain in R SI with fingertip to knee jt line  Right rotation 75% no pain  Left rotation 75% no pain   (Blank rows = not tested)   LE ROM:   Active  Right 08/20/2021 Left 08/20/2021  Hip flexion 95 110  Hip extension      Hip abduction      Hip adduction      Hip internal rotation 45 47  Hip external rotation 35 49  Knee flexion      Knee extension      Ankle dorsiflexion      Ankle plantarflexion      Ankle inversion      Ankle eversion       (Blank rows = not tested)   LE MMT:   MMT Right 08/20/2021 Left 08/20/2021  Hip flexion 4- 4+  Hip extension 3+ limited by pain R SI 4  Hip abduction 3- 4  Hip adduction      Hip internal rotation 4 4  Hip external rotation 4- 4-  Knee flexion 5 5  Knee extension 5 5  Ankle dorsiflexion 5 5  Ankle plantarflexion      Ankle inversion      Ankle eversion       (Blank rows = not tested)   LUMBAR SPECIAL TESTS:  Straight leg raise test: Negative, Slump test: Negative, SI Compression/distraction test: Positive, and FABER test: Positive Sacral Spring test positive. Lumbo slyacral stiffiness on right . Stiff R hip flexion to 95 degrees active FUNCTIONAL TESTS:  5 times sit to stand: 9.98 sec   GAIT: Distance walked: 150 Assistive device utilized: None Level of assistance: Complete Independence Comments: Pt with slight antalgic gait on R       TODAY'S TREATMENT  [facility] Adult PT Treatment:                                                 DATE: 09-13-21 Therapeutic Exercise: Reverse lunge 1 x 10 on R and on L VC for technique Brdge with  ball and 25 # KB 3 x 10 Single leg bridge R 1  x 10 and then L 1 x 10 Supine Piriformis Stretch with Foot on Ground  3 reps  30 sec hold R side Supine Figure 4 Piriformis Stretch  3 reps - 30 sec hold R side Box squat 25 # KB 1 x 15 Chair 18 inches tall, Step ups with mod a with PT in one minute Left 10x and R 10 x Manual Therapy: Trigger Point Dry Needling Treatment: Pre-treatment instruction: Patient instructed on dry needling rationale, procedures, and possible side effects including pain during treatment (achy,cramping feeling), bruising, drop of blood, lightheadedness, nausea, sweating. Patient Consent Given: Yes Education handout provided: Previously provided Muscles treated: R Gluteals/ R piriformis  Needle size and number: .30x6m x 2 Electrical stimulation performed: No Parameters: N/A Treatment response/outcome: Twitch response elicited and Palpable decrease in muscle tension Post-treatment instructions: Patient instructed to expect possible mild to moderate muscle soreness later today and/or tomorrow. Patient instructed in methods to reduce muscle soreness and to continue prescribed HEP. If patient was dry needled over the lung field, patient was instructed on signs and symptoms of pneumothorax and, however unlikely, to see immediate medical attention should they occur. Patient was also educated on signs and symptoms of infection and to seek medical attention should they occur. Patient verbalized understanding of these instructions and education.   [facility]                                                [facility]: [facility] Therapeutic Exercise:  Recmbent bike 6 min level 3 RPE 5/6 Hip Hinge with Dowel x 15 Hip Hinge with External cue of wall x 15  Deadlift  15 # KB on 8 inch elevated box x 15 Deadlift 15 # KB facing 8 in elevated box x 15 Box squat  15 # KB 2 x 10 Mat 19 inches tall, Step ups with mod a with PT in one minute Left 11 x and R 13 x Childs pose exercise forward and rocking to side  Self Care: Taught self mob of R SI by sitting with left leg max hip flex/knee flex and rotating trunk to R Education on proper lifting Resisted hamstring on R with dowel rod and return demo   [facility]                                                DATE: 08-30-21 Noted R pelvic level elevated - tightened QL Therapeutic Exercise: Supine Piriformis Stretch with Foot on Ground  3 reps  30 sec hold R side Abdominal setting 10 sec  hold x 10  Isometric Hold with pelvic floor (hooklying) 1 x 10 sec hold pelvic tilt 2 x 10 supine knee march 3 x 10 with abdominal engagement Knee drop 2 x 10 with abd engagement Supine Figure 4 Piriformis Stretch  3 reps - 30 sec hold R side Clamshell with Resistance  -3 sets - 10 reps Supine Bridge with Mini Swiss Ball Between Knees 3 sets - 10 reps Supine Lower Trunk Rotation  5 reps - 20 sec  hold on R and then L Manual Therapy: STW - to  Right QL and R buttocks/piriformis  Myofascial release of R QL and for piriformis  MET for R SI, trunk rotation thrust R rotated forward Trigger Point Dry Needling Treatment: Pre-treatment instruction: Patient instructed on dry needling rationale, procedures, and possible side effects including pain during treatment (achy,cramping feeling), bruising, drop of blood, lightheadedness, nausea, sweating. Patient Consent Given: Yes Education handout provided: Yes Muscles treated:Right  piriformis, R gluteals and R Quadratus Lumborum  Needle size and number: .30x38m x 4 Electrical stimulation performed: No Parameters: N/A Treatment response/outcome: Twitch response elicited and Palpable decrease in muscle tension Post-treatment instructions: Patient instructed to expect possible mild to moderate muscle soreness later today and/or tomorrow. Patient instructed in methods  to reduce muscle soreness and to continue prescribed HEP. If patient was dry needled over the lung field, patient was instructed on signs and symptoms of pneumothorax and, however unlikely, to see immediate medical attention should they occur. Patient was also educated on signs and symptoms of infection and to seek medical attention should they occur. Patient verbalized understanding of these instructions and education.   Modalities: Moist hot pack [facility] Adult PT Treatment:                                                DATE: 08-27-21 Therapeutic Exercise: -Supine Piriformis Stretch with Foot on Ground  3 reps  30 sec hold R side - Supine Figure 4 Piriformis Stretch  3 reps - 30 sec hold R side - Clamshell with Resistance  -3 sets - 10 reps - Supine Bridge with Mini Swiss Ball Between Knees 3 sets - 10 reps - Supine Lower Trunk Rotation  5 reps - 20 sec  hold on R and then L Manual Therapy: STW - to  Right QL and R buttocks/piriformis  Myofascial release of R QL and for piriformis Trigger Point Dry Needling Treatment: Pre-treatment instruction: Patient instructed on dry needling rationale, procedures, and possible side effects including pain during treatment (achy,cramping feeling), bruising, drop of blood, lightheadedness, nausea, sweating. Patient Consent Given: Yes Education handout provided: Yes Muscles treated: R piriformis, R gluteals and R Qaudratus Lumborum  Needle size and number: .30x756mx 5 Electrical stimulation performed: No Parameters: N/A Treatment response/outcome: Twitch response elicited and Palpable decrease in muscle tension Post-treatment instructions: Patient instructed to expect possible mild to moderate muscle soreness later today and/or tomorrow. Patient instructed in methods to reduce muscle soreness and to continue prescribed HEP. If patient was dry needled over the lung field, patient was instructed on signs and symptoms of pneumothorax and, however unlikely, to see  immediate medical attention should they occur. Patient was also educated on signs and symptoms of infection and to seek medical attention should they occur. Patient verbalized understanding of these instructions and education.   Modalities: Moist hot pack  Issue initial HEP MET for R SI joint     PATIENT EDUCATION:  Education details: POC, explanation of LEFS, iniital HEP Explanation of findings education on TPDN precautions and aftercare Person educated: Patient Education method: Explanation, Demonstration, Tactile cues, Verbal cues, and Handouts Education comprehension: verbalized understanding, returned demonstration, verbal cues required, tactile cues required, and needs further education     HOME EXERCISE PROGRAM: [email or web address]: [email or web address] Date: 08/20/2021 Prepared by: LaVoncille Lo Program Notes sit in chair with hands under R thigh. Press/resist into hands for hamstring resistance to decrease pain in R Sacral Iliac area   Exercises - Supine Piriformis  Stretch with Foot on Ground  - 1-2 x daily - 7 x weekly - 1 sets - 3 reps - 10-15 hold - Supine Figure 4 Piriformis Stretch  - 1-2 x daily - 7 x weekly - 1 sets - 3 reps - 10-15 hold  added 08-27-21 - Clamshell with Resistance  - 1 x daily - 7 x weekly - 3 sets - 10 reps - Supine Bridge with Mini Swiss Ball Between Knees  - 1 x daily - 7 x weekly - 3 sets - 10 reps - Supine Lower Trunk Rotation  - 2 x daily - 7 x weekly - 1 sets - 5 reps - 20 sec  hold  Added 08-30-21 Abdominal setting 10 sec  hold x 10  Isometric Hold with pelvic floor (hooklying) 1 x 10 sec hold pelvic tilt 2 x 10 supine knee march 3 x 10 with abdominal engagement Knee drop 2 x 10 with abd engagement Added 09-06-21 - Quadruped Rocking Slow  - 1 x daily - 7 x weekly - 3 sets - 10 reps - Standing Hip Hinge with Dowel  - 1 x daily - 7 x weekly - 3 sets - 10 reps - Goblet Squat with Kettlebell  - 1 x daily - 7 x weekly  - 3 sets - 10 reps - Kettlebell Deadlift  - 1 x daily - 7 x weekly - 3 sets - Step up on 18 inch step  - 1 x daily - 7 x weekly - 2 sets - 10 reps  Added 09-13-21  - Reverse Lunge  - 1 x daily - 7 x weekly - 3 sets - 10 reps - Single Leg Bridge  - 1 x daily - 7 x weekly - 3 sets - 10 reps ASSESSMENT:   CLINICAL IMPRESSION: Ms Borenstein enters clinic  for 5th time arriving late by about 17 min Pt with 3/10 pain R SI.   Pt RX TPDN and  strengthening exercise to decrease pain. Pt consented dry needling and was monitored throughout session. Pt responded well to manual and lumbar rotational/ SI thrust with decrease in pain.  Pt was able to handle more challenging exercises this session in a shortened amount of time. Will continue with progressive strength and pain management in future remaining sessions.  EVAL- Patient is a 46 y.o. female who was seen today for physical therapy evaluation and treatment for R SI/ piriformis pain which limits her ability to perform household chores, gardening and riding bicycles with her children.  Pt has decreased mobility in segmental flexion sacrum at lumbosacral junction.  Pt with limited hip mobility in hip flexion.  Pt has difficulty standing more than 10 minutes or pressing the accelerator of car without pain up to 8/10.  Pt will benefit from skilled PT to address deficits and reduce pain for more comfortable     OBJECTIVE IMPAIRMENTS Abnormal gait, decreased activity tolerance, decreased mobility, decreased ROM, decreased strength, hypomobility, impaired flexibility, improper body mechanics, postural dysfunction, obesity, and pain.    ACTIVITY LIMITATIONS cleaning, driving, meal prep, yard work, and shopping.    PERSONAL FACTORS Fitness are also affecting patient's functional outcome.  Pt has not been able to exercise due to this SI/piriformis pain and would like to return to running eventually but would like to at least return to biking with children     REHAB  POTENTIAL: Excellent   CLINICAL DECISION MAKING: Evolving/moderate complexity   EVALUATION COMPLEXITY: Moderate     GOALS: Goals reviewed  with patient? Yes   SHORT TERM GOALS: Target date: 09/10/2021   Pt will be independent with initial HEP Baseline:given initial HEP Goal status: MET   2.  Pt will reduce pain during  standing to 5/10 from 8/10 and stand for 15 minutes Baseline: Pt with 8/10 pain if walking standing more than 10 minutes 08-30-21 5/10 for 15 to 20 minutes 09-06-21  4/10 to 3/10 Goal status: MET   3.  Pt will be able to press accelerator of car without exacerbating R SI pain Baseline: Pt with pain pressing accelerator of car 5/10 to 8/10 when R SI irritated 09-06-21 worked on strengthening to step down from large truck Goal status: ongoing   4.  Pt will begin home walking program daily for 15 minutes minimum Baseline: Pt not routinely exercising  now 08-30-21 starting now 10 min stop and then 10 min Goal status: ongoing       LONG TERM GOALS: Target date: 10/01/2021   Pt will be independent with advanced HEP Baseline:  Goal status: ONgoing   2.  Patient will report improved functional level on LEFS >/= 70/80 in order to allow for return to Baseline: LEFS 36/80 x 100 =45 % Goal status: ongoing   3.  Pt will be able to ride bikes with children without exacerbating pain greater than 2/10  Baseline: Pt now has pain at rest 4/10 and up to 8/10 with activity Goal status: ongoing   4.  Pt will be able to get into kneeling positions for gardening Baseline: Pt with pain up to 8/10 and avoids steps and kneeling Goal status: ongoing   5.  Pt will be consistent with home walking program at least 7000 steps 4 days a week Baseline: No routine exercise at this point Goal status: ongoing   6.  Pt will increase ER and hip flexion of R LE in order to more easily don clothing and kneel for gardening Baseline: Pt with pain with hip flex past 95 degrees and when kneeling Goal  status:ongoing     PLAN: PT FREQUENCY: 2x/week   PT DURATION: 6 weeks   PLANNED INTERVENTIONS: Therapeutic exercises, Therapeutic activity, Neuromuscular re-education, Gait training, Patient/Family education, Joint mobilization, Dry Needling, Electrical stimulation, Spinal mobilization, Cryotherapy, Moist heat, Taping, and Manual therapy.   PLAN FOR NEXT SESSION: GOALS FOTO and progress strength     [employer] Authorization    Choose one: Rehabilitative   Standardized Assessment or Functional Outcome Tool: See Pain Assessment and LEFS   Score or Percent Disability: 36/80 x 100  = 45%   Body Parts Treated (Select each separately):  Lumbopelvic. Overall deficits/functional limitations for body part selected: moderate Hip  R  Overall deficits/functional limitations for body part selected Moderate       If treatment provided at initial evaluation, no treatment charged due to lack of authorization.   Voncille Lo, PT, [place] Certified Exercise Expert for the Aging Adult  09/13/21 1:28 PM Phone: [phone] Fax: [place], [place], [place] Certified Exercise Expert for the Aging Adult  10/16/21 7:53 AM Phone: [phone] Fax: [phone]

## 2021-09-11 NOTE — Telephone Encounter (Signed)
LVM for Ms Carol Welch to check on why she did not show for 09-11-21 appt.  Pt told next appt on 09-13-21 and reminded of cancellation/ no show policy. ?Voncille Lo, PT, [place] ?Certified Exercise Expert for the Aging Adult  ?09/11/21 8:54 AM ?Phone: [phone] ?Fax: [phone]  ?

## 2021-09-12 DIAGNOSIS — F331 Major depressive disorder, recurrent, moderate: Secondary | ICD-10-CM | POA: Diagnosis not present

## 2021-09-13 ENCOUNTER — Ambulatory Visit: Payer: Medicaid Other | Admitting: Physical Therapy

## 2021-09-13 DIAGNOSIS — M25651 Stiffness of right hip, not elsewhere classified: Secondary | ICD-10-CM

## 2021-09-13 DIAGNOSIS — M6281 Muscle weakness (generalized): Secondary | ICD-10-CM | POA: Diagnosis not present

## 2021-09-13 DIAGNOSIS — G8929 Other chronic pain: Secondary | ICD-10-CM

## 2021-09-13 DIAGNOSIS — M5441 Lumbago with sciatica, right side: Secondary | ICD-10-CM | POA: Diagnosis not present

## 2021-09-13 NOTE — Patient Instructions (Signed)
? ? ? ?  Garen Lah, PT, ATRIC ?Certified Exercise Expert for the Aging Adult  ?09/13/21 9:18 AM ?Phone: [phone] ?Fax: [phone]  ? ? ? ?

## 2021-09-18 ENCOUNTER — Encounter: Payer: Self-pay | Admitting: Physical Therapy

## 2021-09-18 ENCOUNTER — Ambulatory Visit: Payer: Medicaid Other | Admitting: Physical Therapy

## 2021-09-18 DIAGNOSIS — G8929 Other chronic pain: Secondary | ICD-10-CM | POA: Diagnosis not present

## 2021-09-18 DIAGNOSIS — M6281 Muscle weakness (generalized): Secondary | ICD-10-CM

## 2021-09-18 DIAGNOSIS — M5441 Lumbago with sciatica, right side: Secondary | ICD-10-CM | POA: Diagnosis not present

## 2021-09-18 DIAGNOSIS — M25651 Stiffness of right hip, not elsewhere classified: Secondary | ICD-10-CM

## 2021-09-18 NOTE — Therapy (Addendum)
OUTPATIENT PHYSICAL THERAPY TREATMENT NOTE   Patient Name: Carol Welch MRN: [id number] DOB:1976-01-21, 46 y.o., female Today's Date: 09/20/2021  PCP: Gladys Damme, MD REFERRING PROVIDER: Kinnie Feil, MD  END OF SESSION:   PT End of Session - 09/20/21 0805     Visit Number 7    Number of Visits 13    Date for PT Re-Evaluation 10/01/21    Authorization Type [employer]  No tx, estimunattended, VASO or ionto    PT Start Time 0804    PT Stop Time 0903    PT Time Calculation (min) 59 min    Activity Tolerance Patient tolerated treatment well    Behavior During Therapy [facility] for tasks assessed/performed                  Past Medical History:  Diagnosis Date   Anal fissure    Colitis    Depression    Edema, lower extremity    Fatty liver    Hypothyroidism    Low blood pressure    PONV (postoperative nausea and vomiting)    SVD (spontaneous vaginal delivery) 06/30/2015   Vitamin D deficiency    Past Surgical History:  Procedure Laterality Date   BREAST CYST ASPIRATION Right 02/13/2018   CYSTOSCOPY N/A 12/30/2019   Procedure: CYSTOSCOPY;  Surgeon: Janyth Contes, MD;  Location: [place];  Service: Gynecology;  Laterality: N/A;   ENDOVENOUS ABLATION SAPHENOUS VEIN W/ LASER Right 10/08/2018   endovenous laser ablation right saphenous vein by Deitra Mayo MD   LAPAROSCOPIC VAGINAL HYSTERECTOMY WITH SALPINGECTOMY Bilateral 12/30/2019   Procedure: LAPAROSCOPIC ASSISTED VAGINAL HYSTERECTOMY WITH SALPINGECTOMY;  Surgeon: Janyth Contes, MD;  Location: [place];  Service: Gynecology;  Laterality: Bilateral;   Patient Active Problem List   Diagnosis Date Noted   Class 1 obesity due to excess calories with body mass index (BMI) of 30.0 to 30.9 in adult 08/13/2021   Trapezius muscle strain, right, initial encounter 03/20/2021   Gastroenteritis 10/03/2020   Hand trauma, right, initial encounter 07/18/2020    Encounter for surveillance of abnormal nevi 07/09/2020   Acquired hypothyroidism 07/07/2020   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   Neck pain 01/18/2020   S/P laparoscopic assisted vaginal hysterectomy (LAVH) 12/30/2019   External thrombosed hemorrhoids 09/11/2019   MDD (major depressive disorder) 08/24/2019   Orthostatic hypotension 08/24/2019   Hypothyroidism 07/06/2018   Family history of congenital anomaly 08/27/2016    REFERRING DIAG: M79.18 (ICD-10-CM) - Piriformis muscle pain  THERAPY DIAG:  Chronic right-sided low back pain with right-sided sciatica  Stiffness of right hip, not elsewhere classified  PERTINENT HISTORY: Colitis, depression, hypothyroidism, vitamin D deficiency, fatty liver  PRECAUTIONS: none  SUBJECTIVE: I am a 3/10 in my R buttock  I still have a slight pinch in R buttock. I have been doing a  lot of gardening and after I do that it is a 7/10.  I do the stretches and it helps but I wish it would just go away.        PAIN:  after gardening I am a 7/10 Are you having pain? Yes: NPRS scale: 3/10 Pain location: R piriformis Pain description: sharp in r buttock Aggravating factors:   Relieving factors:     Aggravating factors: walking and standing hurts the most, gardening, standing for longer than 10 min, sometimes pushing on the accelerator,going up and down steps, I also have pain when riding bicycle so I walk instead of ride Relieving factors: stretches  using heat at home At worst my R side buttock pain ins 8/10  OBJECTIVE: (objective measures completed at initial evaluation unless otherwise dated)     OBJECTIVE:    DIAGNOSTIC FINDINGS:  None for Low back   PATIENT SURVEYS:  LEFS 36/80 x 100  45% 09-18-21  57/80 71.3%   SCREENING FOR RED FLAGS: Bowel or bladder incontinence: No Spinal tumors: No Cauda equina syndrome: No Compression fracture: No Abdominal aneurysm: No   COGNITION:           Overall cognitive  status: Within functional limits for tasks assessed                          SENSATION: WFL   MUSCLE LENGTH: Hamstrings: Right 65 deg; Left 70 deg     POSTURE:  Forward head and rounded shoulder    R elevated pelvic level  R ASIS more posterior than L   PALPATION: Limited segmental flexion and lumbo sacral junction TTP over R piriformis and over R SI Hypomobility lumbar spine   LUMBAR ROM:    Active  A/PROM  08/20/2021  Flexion Fingertips to bil ankles  Extension Pain with ext 20 degrees  Right lateral flexion Pain in R SI fingertip to knee jt line  Left lateral flexion Pain in R SI with fingertip to knee jt line  Right rotation 75% no pain  Left rotation 75% no pain   (Blank rows = not tested)   LE ROM:   Active  Right 08/20/2021 Left 08/20/2021  Hip flexion 95 110  Hip extension      Hip abduction      Hip adduction      Hip internal rotation 45 47  Hip external rotation 35 49  Knee flexion      Knee extension      Ankle dorsiflexion      Ankle plantarflexion      Ankle inversion      Ankle eversion       (Blank rows = not tested)   LE MMT:   MMT Right 08/20/2021 Left 08/20/2021  Hip flexion 4- 4+  Hip extension 3+ limited by pain R SI 4  Hip abduction 3- 4  Hip adduction      Hip internal rotation 4 4  Hip external rotation 4- 4-  Knee flexion 5 5  Knee extension 5 5  Ankle dorsiflexion 5 5  Ankle plantarflexion      Ankle inversion      Ankle eversion       (Blank rows = not tested)   LUMBAR SPECIAL TESTS:  Straight leg raise test: Negative, Slump test: Negative, SI Compression/distraction test: Positive, and FABER test: Positive Sacral Spring test positive. Lumbo slyacral stiffiness on right . Stiff R hip flexion to 95 degrees active FUNCTIONAL TESTS:  5 times sit to stand: 9.98 sec   GAIT: Distance walked: 150 Assistive device utilized: None Level of assistance: Complete Independence Comments: Pt with slight antalgic gait on R        TODAY'S TREATMENT  [facility] Adult PT Treatment:                                                DATE: 09-20-21 Therapeutic Exercise: Recumbent bike level 6 for 6 minutes RPE 5/6 Box squat 1 x 10  15 # KB Box Squat 2 x 10 with 25 # KB Kettlebell Deadlift  25 # 1 x 8 Step up on 18 inch step  holding onto PT for balance 15 x R and L each Single Leg Bridge   2 x 10 R and then L Reverse lunge 1 x 10 R and then L Lift bar romanian deadlift to knees with 45 # lift bar. VC and TC mod cuing needed [place] Deadlift with Dumbbells just past Knees ONLY  15 lb DB in each hand x 10 each Quadruped Forearm Plank with a Mule Kick  Quadruped Hip Abduction with GTB Ending with stretching  Standing Hip Hinge with Dowel  1 x 10 VC and TC for correct execution Quadruped Rocking Slow  x 5 forward, and to R and to L VC Supine Double Knee to Chest  Supine Piriformis Stretch Pulling Heel to Hipx 2 x 30 sec stretch on R   Manual Therapy: Left side-lying lumbar sacral thrust with cavitation R QL myofascial/  Therapeutic Activity: Demo of hoeing and shoveling with tools to use legs instead of back and arms Demo lifting principles  and return demo of lifting Working on proper hip hinging for gardening Modalities: Moist heat pack  [facility] Adult PT Treatment:                                                DATE: 09-18-21 09-18-21  57/80 71.3% Therapeutic Exercise: Brdge with  ball Squeezeand 25 # KB 3 x 10 Chair squat 3 x 10 with 25 # KB Reverse lunge 1 x 10 on R and on L  [place] stretch 2 x 30 sec Sitting ER stretch on L and R  30 se each   Manual Therapy:  STW of R gluteals, and piriformis Myofascial release of R QL and for piriformis  MET for R SI, trunk rotation thrust R rotated forward Trigger Point Dry Needling Treatment: Pre-treatment instruction: Patient instructed on dry needling rationale, procedures, and possible side effects including pain during treatment (achy,cramping feeling), bruising, drop of  blood, lightheadedness, nausea, sweating. Patient Consent Given: Yes Education handout provided: Previously provided Muscles treated:R  piriformis and  gluteals  R QL Needle size and number: .30x73m x 2 Electrical stimulation performed: No Parameters: N/A Treatment response/outcome: Twitch response elicited and Palpable decrease in muscle tension Post-treatment instructions: Patient instructed to expect possible mild to moderate muscle soreness later today and/or tomorrow. Patient instructed in methods to reduce muscle soreness and to continue prescribed HEP. If patient was dry needled over the lung field, patient was instructed on signs and symptoms of pneumothorax and, however unlikely, to see immediate medical attention should they occur. Patient was also educated on signs and symptoms of infection and to seek medical attention should they occur. Patient verbalized understanding of these instructions and education.   Modalities: Moist hot pack concurrent with exercise and manual  [facility] Adult PT Treatment:                                                DATE: 09-13-21 Therapeutic Exercise: Reverse lunge 1 x 10 on R and on L VC for technique Brdge with  ball and 25 # KB 3 x 10  Single leg bridge R 1 x 10 and then L 1 x 10 Supine Piriformis Stretch with Foot on Ground  3 reps  30 sec hold R side Supine Figure 4 Piriformis Stretch  3 reps - 30 sec hold R side Box squat 25 # KB 1 x 15 Chair 18 inches tall, Step ups with mod a with PT in one minute Left 10x and R 10 x Manual Therapy:  STW of R gluteals, and piriformis  Myofascial release of R QL and for piriformis  MET for R SI, trunk rotation thrust R rotated forward Trigger Point Dry Needling Treatment: Pre-treatment instruction: Patient instructed on dry needling rationale, procedures, and possible side effects including pain during treatment (achy,cramping feeling), bruising, drop of blood, lightheadedness, nausea, sweating. Patient Consent  Given: Yes Education handout provided: Previously provided Muscles treated: R Gluteals/ R piriformis  Needle size and number: .30x53m x 2 Electrical stimulation performed: No Parameters: N/A Treatment response/outcome: Twitch response elicited and Palpable decrease in muscle tension Post-treatment instructions: Patient instructed to expect possible mild to moderate muscle soreness later today and/or tomorrow. Patient instructed in methods to reduce muscle soreness and to continue prescribed HEP. If patient was dry needled over the lung field, patient was instructed on signs and symptoms of pneumothorax and, however unlikely, to see immediate medical attention should they occur. Patient was also educated on signs and symptoms of infection and to seek medical attention should they occur. Patient verbalized understanding of these instructions and education.   [facility]                                                [facility]: 09[facility]21 Therapeutic Exercise:  Recmbent bike 6 min level 3 RPE 5/6 Hip Hinge with Dowel x 15 Hip Hinge with External cue of wall x 15  Deadlift  15 # KB on 8 inch elevated box x 15 Deadlift 15 # KB facing 8 in elevated box x 15 Box squat 15 # KB 2 x 10 Mat 19 inches tall, Step ups with mod a with PT in one minute Left 11 x and R 13 x Childs pose exercise forward and rocking to side  Self Care: Taught self mob of R SI by sitting with left leg max hip flex/knee flex and rotating trunk to R Education on proper lifting Resisted hamstring on R with dowel rod and return demo   [facility]                                                [facility]: [facility] Noted R pelvic level elevated - tightened QL Therapeutic Exercise: Supine Piriformis Stretch with Foot on Ground  3 reps  30 sec hold R side Abdominal setting 10 sec  hold x 10  Isometric Hold with pelvic floor (hooklying) 1 x 10 sec hold pelvic tilt 2 x 10 supine knee march 3 x 10 with abdominal  engagement Knee drop 2 x 10 with abd engagement Supine Figure 4 Piriformis Stretch  3 reps - 30 sec hold R side Clamshell with Resistance  -3 sets - 10 reps Supine Bridge with Mini Swiss Ball Between Knees 3 sets - 10 reps Supine Lower Trunk Rotation  5 reps - 20 sec  hold on R and then L Manual Therapy: STW - to  Right QL and R buttocks/piriformis  Myofascial release of R QL and for piriformis  MET for R SI, trunk rotation thrust R rotated forward Trigger Point Dry Needling Treatment: Pre-treatment instruction: Patient instructed on dry needling rationale, procedures, and possible side effects including pain during treatment (achy,cramping feeling), bruising, drop of blood, lightheadedness, nausea, sweating. Patient Consent Given: Yes Education handout provided: Yes Muscles treated:Right  piriformis, R gluteals and R Quadratus Lumborum  Needle size and number: .30x75m x 4 Electrical stimulation performed: No Parameters: N/A Treatment response/outcome: Twitch response elicited and Palpable decrease in muscle tension Post-treatment instructions: Patient instructed to expect possible mild to moderate muscle soreness later today and/or tomorrow. Patient instructed in methods to reduce muscle soreness and to continue prescribed HEP. If patient was dry needled over the lung field, patient was instructed on signs and symptoms of pneumothorax and, however unlikely, to see immediate medical attention should they occur. Patient was also educated on signs and symptoms of infection and to seek medical attention should they occur. Patient verbalized understanding of these instructions and education.   Modalities: Moist hot pack [facility] Adult PT Treatment:                                                DATE: 08-27-21 Therapeutic Exercise: -Supine Piriformis Stretch with Foot on Ground  3 reps  30 sec hold R side - Supine Figure 4 Piriformis Stretch  3 reps - 30 sec hold R side - Clamshell with Resistance   -3 sets - 10 reps - Supine Bridge with Mini Swiss Ball Between Knees 3 sets - 10 reps - Supine Lower Trunk Rotation  5 reps - 20 sec  hold on R and then L Manual Therapy: STW - to  Right QL and R buttocks/piriformis  Myofascial release of R QL and for piriformis Trigger Point Dry Needling Treatment: Pre-treatment instruction: Patient instructed on dry needling rationale, procedures, and possible side effects including pain during treatment (achy,cramping feeling), bruising, drop of blood, lightheadedness, nausea, sweating. Patient Consent Given: Yes Education handout provided: Yes Muscles treated: R piriformis, R gluteals and R Qaudratus Lumborum  Needle size and number: .30x72mx 5 Electrical stimulation performed: No Parameters: N/A Treatment response/outcome: Twitch response elicited and Palpable decrease in muscle tension Post-treatment instructions: Patient instructed to expect possible mild to moderate muscle soreness later today and/or tomorrow. Patient instructed in methods to reduce muscle soreness and to continue prescribed HEP. If patient was dry needled over the lung field, patient was instructed on signs and symptoms of pneumothorax and, however unlikely, to see immediate medical attention should they occur. Patient was also educated on signs and symptoms of infection and to seek medical attention should they occur. Patient verbalized understanding of these instructions and education.   Modalities: Moist hot pack  Issue initial HEP MET for R SI joint     PATIENT EDUCATION:  Education details: POC, explanation of LEFS, iniital HEP Explanation of findings education on TPDN precautions and aftercare Person educated: Patient Education method: Explanation, Demonstration, Tactile cues, Verbal cues, and Handouts Education comprehension: verbalized understanding, returned demonstration, verbal cues required, tactile cues required, and needs further education     HOME EXERCISE  PROGRAM:  updated 09-20-21 Access Code: [email or web address] [facility] Surgery [email or web address]: [email or web address] Date: 09/20/2021  Prepared by: Voncille Lo  Program Notes sit in chair with hands under R thigh. Press/resist into hands for hamstring resistance to decrease pain in R Sacral Iliac area  Exercises - Supine Piriformis Stretch with Foot on Ground  - 1-2 x daily - 7 x weekly - 1 sets - 3 reps - 10-15 hold - Supine Figure 4 Piriformis Stretch  - 1-2 x daily - 7 x weekly - 1 sets - 3 reps - 10-15 hold - Clamshell with Resistance  - 1 x daily - 7 x weekly - 3 sets - 10 reps - Supine Bridge with Mini Swiss Ball Between Knees  - 1 x daily - 7 x weekly - 3 sets - 10 reps - Supine Lower Trunk Rotation  - 2 x daily - 7 x weekly - 1 sets - 5 reps - 20 sec  hold - Supine Piriformis Stretch Pulling Heel to Hip  - 1-2 x daily - 7 x weekly - 1 sets - 2-3 reps - 30 sec hold - Quadruped Rocking Slow  - 1 x daily - 7 x weekly - 3 sets - 10 reps - Standing Hip Hinge with Dowel  - 1 x daily - 7 x weekly - 3 sets - 10 reps - Goblet Squat with Kettlebell  - 1 x daily - 7 x weekly - 3 sets - 10 reps - Kettlebell Deadlift  - 1 x daily - 7 x weekly - 3 sets - Step up on 18 inch step  - 1 x daily - 7 x weekly - 2 sets - 10 reps - Reverse Lunge  - 1 x daily - 7 x weekly - 3 sets - 10 reps - Single Leg Bridge  - 1 x daily - 7 x weekly - 3 sets - 10 reps - Supine Double Knee to Chest  - 1 x daily - 7 x weekly - 3 sets - 10 reps - Supine Piriformis Stretch Pulling Heel to Hip  - 1 x daily - 7 x weekly - 3 sets - 10 reps - Quadruped Rocking Slow  - 1 x daily - 7 x weekly - 3 sets - 10 reps - Quadruped Forearm Plank with a Mule Kick  - 1 x daily - 7 x weekly - 3 sets - 10 reps - Quadruped Hip Abduction with Resistance Loop  - 1 x daily - 7 x weekly - 3 sets - 10 reps - Benin Deadlift with Dumbbells just past Knees ONLY   - 1 x daily - 7 x weekly - 3 sets - 10 reps ASSESSMENT:   CLINICAL IMPRESSION:  Ms Cheslock enters  clinic  with 3/10 pain in R SI.  Pt states she has been gardening and irritating back and pt corrected by PT for proper principles of lifting and utilizing shovels and hoes in gardening with return demo.  Advanced HEP for use at home.  Pt needs reinforcement  of HEP and will be able to handle Low back pain with HEP. Pt was sore but 1/10 at end of session after RX today.  Pt given written handout.          OBJECTIVE IMPAIRMENTS Abnormal gait, decreased activity tolerance, decreased mobility, decreased ROM, decreased strength, hypomobility, impaired flexibility, improper body mechanics, postural dysfunction, obesity, and pain.    ACTIVITY LIMITATIONS cleaning, driving, meal prep, yard work, and shopping.    PERSONAL FACTORS Fitness are also affecting patient's functional outcome.  Pt has not been able to exercise due  to this SI/piriformis pain and would like to return to running eventually but would like to at least return to biking with children     REHAB POTENTIAL: Excellent   CLINICAL DECISION MAKING: Evolving/moderate complexity   EVALUATION COMPLEXITY: Moderate     GOALS: Goals reviewed with patient? Yes   SHORT TERM GOALS: Target date: 09/10/2021   Pt will be independent with initial HEP Baseline:given initial HEP Goal status: MET   2.  Pt will reduce pain during  standing to 5/10 from 8/10 and stand for 15 minutes Baseline: Pt with 8/10 pain if walking standing more than 10 minutes 08-30-21 5/10 for 15 to 20 minutes 09-06-21  4/10 to 3/10 Goal status: MET   3.  Pt will be able to press accelerator of car without exacerbating R SI pain Baseline: Pt with pain pressing accelerator of car 5/10 to 8/10 when R SI irritated 09-06-21 worked on strengthening to step down from large truck. 09-18-21 3/10 Goal status: Partially met   4.  Pt will begin home walking program daily for 15 minutes minimum Baseline: Pt not routinely exercising  now 08-30-21 starting now 10 min stop and then 10 min5-16-23   I am now walking  about 20-25 minutes and I dont have to stop Goal status: MET       LONG TERM GOALS: Target date: 10/01/2021   Pt will be independent with advanced HEP Baseline:  Goal status: ONgoing   2.  Patient will report improved functional level on LEFS >/= 70/80 in order to allow for return to Baseline: LEFS 36/80 x 100 =45 % 09-18-21 09-18-21  57/80 71.3% Goal status: MET   3.  Pt will be able to ride bikes with children without exacerbating pain greater than 2/10  Baseline: Pt now has pain at rest 4/10 and up to 8/10 with activity  I have some pain still 5/10 because seat is tiny seat Goal status: ongoing   4.  Pt will be able to get into kneeling positions for gardening Baseline: Pt with pain up to 8/10 and avoids steps and kneeling, 09-17-21 3/10 when kneeling pain improving Goal status:  MET   5.  Pt will be consistent with home walking program at least 7000 steps 4 days a week Baseline: No routine exercise at this point 09-18-21 pt is now walking about 20-25 minutes a day Goal status: MET   6.  Pt will increase ER and hip flexion of R LE in order to more easily don clothing and kneel for gardening Baseline: Pt with pain with hip flex past 95 degrees and when kneeling Goal status:ongoing     PLAN: PT FREQUENCY: 2x/week   PT DURATION: 6 weeks   PLANNED INTERVENTIONS: Therapeutic exercises, Therapeutic activity, Neuromuscular re-education, Gait training, Patient/Family education, Joint mobilization, Dry Needling, Electrical stimulation, Spinal mobilization, Cryotherapy, Moist heat, Taping, and Manual therapy.   PLAN FOR NEXT SESSION:   use bike and get and tolerate. Reinforce HEP     [employer] Authorization    Choose one: Rehabilitative   Standardized Assessment or Functional Outcome Tool: See Pain Assessment and LEFS   Score or Percent Disability: 36/80 x 100  = 45%   Body Parts Treated (Select each separately):  Lumbopelvic. Overall deficits/functional  limitations for body part selected: moderate Hip  R  Overall deficits/functional limitations for body part selected Moderate       If treatment provided at initial evaluation, no treatment charged due to lack of authorization.  Voncille Lo, PT, [place] Certified Exercise Expert for the Aging Adult  09/20/21 9:08 AM Phone: [phone] Fax: [phone]   Voncille Lo, [place], [place] Certified Exercise Expert for the Aging Adult  10/16/21 7:58 AM Phone: [phone] Fax: [phone]

## 2021-09-18 NOTE — Therapy (Addendum)
OUTPATIENT PHYSICAL THERAPY TREATMENT NOTE   Patient Name: Carol Welch MRN: [id number] DOB:12/02/1975, 46 y.o., female Today's Date: 09/18/2021  PCP: Gladys Damme, MD REFERRING PROVIDER: Kinnie Feil, MD  END OF SESSION:   PT End of Session - 09/18/21 0819     Visit Number 6    Number of Visits 13    Date for PT Re-Evaluation 10/01/21    Authorization Type [employer]  No tx, estimunattended, VASO or ionto    PT Start Time 0818    PT Stop Time 0845    PT Time Calculation (min) 27 min                 Past Medical History:  Diagnosis Date   Anal fissure    Colitis    Depression    Edema, lower extremity    Fatty liver    Hypothyroidism    Low blood pressure    PONV (postoperative nausea and vomiting)    SVD (spontaneous vaginal delivery) 06/30/2015   Vitamin D deficiency    Past Surgical History:  Procedure Laterality Date   BREAST CYST ASPIRATION Right 02/13/2018   CYSTOSCOPY N/A 12/30/2019   Procedure: CYSTOSCOPY;  Surgeon: Janyth Contes, MD;  Location: [place];  Service: Gynecology;  Laterality: N/A;   ENDOVENOUS ABLATION SAPHENOUS VEIN W/ LASER Right 10/08/2018   endovenous laser ablation right saphenous vein by Deitra Mayo MD   LAPAROSCOPIC VAGINAL HYSTERECTOMY WITH SALPINGECTOMY Bilateral 12/30/2019   Procedure: LAPAROSCOPIC ASSISTED VAGINAL HYSTERECTOMY WITH SALPINGECTOMY;  Surgeon: Janyth Contes, MD;  Location: [place];  Service: Gynecology;  Laterality: Bilateral;   Patient Active Problem List   Diagnosis Date Noted   Class 1 obesity due to excess calories with body mass index (BMI) of 30.0 to 30.9 in adult 08/13/2021   Trapezius muscle strain, right, initial encounter 03/20/2021   Gastroenteritis 10/03/2020   Hand trauma, right, initial encounter 07/18/2020   Encounter for surveillance of abnormal nevi 07/09/2020   Acquired hypothyroidism 07/07/2020   Subclinical  iodine-deficiency hypothyroidism 07/04/2020   Neck pain 01/18/2020   S/P laparoscopic assisted vaginal hysterectomy (LAVH) 12/30/2019   External thrombosed hemorrhoids 09/11/2019   MDD (major depressive disorder) 08/24/2019   Orthostatic hypotension 08/24/2019   Hypothyroidism 07/06/2018   Family history of congenital anomaly 08/27/2016    REFERRING DIAG: M79.18 (ICD-10-CM) - Piriformis muscle pain  THERAPY DIAG:  Chronic right-sided low back pain with right-sided sciatica  Stiffness of right hip, not elsewhere classified  PERTINENT HISTORY: Colitis, depression, hypothyroidism, vitamin D deficiency, fatty liver  PRECAUTIONS: none  SUBJECTIVE: I am a slight 3/10 in my R buttock  I have been doing the bridge with ball at home and I think it helps. We went to the beach for Mother's day and I walked well on the beach until the end. I soaked in hot water and I did better.        PAIN:  Are you having pain? Yes: NPRS scale: 3/10 Pain location: R piriformis Pain description: sharp in r buttock Aggravating factors:   Relieving factors:     Aggravating factors: walking and standing hurts the most, gardening, standing for longer than 10 min, sometimes pushing on the accelerator,going up and down steps, I also have pain when riding bicycle so I walk instead of ride Relieving factors: stretches  using heat at home At worst my R side buttock pain ins 8/10  OBJECTIVE: (objective measures completed at initial evaluation unless otherwise dated)  OBJECTIVE:    DIAGNOSTIC FINDINGS:  None for Low back   PATIENT SURVEYS:  LEFS 36/80 x 100  45% 09-18-21  57/80 71.3%   SCREENING FOR RED FLAGS: Bowel or bladder incontinence: No Spinal tumors: No Cauda equina syndrome: No Compression fracture: No Abdominal aneurysm: No   COGNITION:           Overall cognitive status: Within functional limits for tasks assessed                          SENSATION: WFL   MUSCLE  LENGTH: Hamstrings: Right 65 deg; Left 70 deg     POSTURE:  Forward head and rounded shoulder    R elevated pelvic level  R ASIS more posterior than L   PALPATION: Limited segmental flexion and lumbo sacral junction TTP over R piriformis and over R SI Hypomobility lumbar spine   LUMBAR ROM:    Active  A/PROM  08/20/2021  Flexion Fingertips to bil ankles  Extension Pain with ext 20 degrees  Right lateral flexion Pain in R SI fingertip to knee jt line  Left lateral flexion Pain in R SI with fingertip to knee jt line  Right rotation 75% no pain  Left rotation 75% no pain   (Blank rows = not tested)   LE ROM:   Active  Right 08/20/2021 Left 08/20/2021  Hip flexion 95 110  Hip extension      Hip abduction      Hip adduction      Hip internal rotation 45 47  Hip external rotation 35 49  Knee flexion      Knee extension      Ankle dorsiflexion      Ankle plantarflexion      Ankle inversion      Ankle eversion       (Blank rows = not tested)   LE MMT:   MMT Right 08/20/2021 Left 08/20/2021  Hip flexion 4- 4+  Hip extension 3+ limited by pain R SI 4  Hip abduction 3- 4  Hip adduction      Hip internal rotation 4 4  Hip external rotation 4- 4-  Knee flexion 5 5  Knee extension 5 5  Ankle dorsiflexion 5 5  Ankle plantarflexion      Ankle inversion      Ankle eversion       (Blank rows = not tested)   LUMBAR SPECIAL TESTS:  Straight leg raise test: Negative, Slump test: Negative, SI Compression/distraction test: Positive, and FABER test: Positive Sacral Spring test positive. Lumbo slyacral stiffiness on right . Stiff R hip flexion to 95 degrees active FUNCTIONAL TESTS:  5 times sit to stand: 9.98 sec   GAIT: Distance walked: 150 Assistive device utilized: None Level of assistance: Complete Independence Comments: Pt with slight antalgic gait on R       TODAY'S TREATMENT  [facility] Adult PT Treatment:                                                DATE:  09-18-21 09-18-21  57/80 71.3% Therapeutic Exercise: Brdge with  ball Squeezeand 25 # KB 3 x 10 Chair squat 3 x 10 with 25 # KB Reverse lunge 1 x 10 on R and on L  [place] stretch 2 x 30 sec Sitting  ER stretch on L and R  30 se each   Manual Therapy:  STW of R gluteals, and piriformis Myofascial release of R QL and for piriformis  MET for R SI, trunk rotation thrust R rotated forward Trigger Point Dry Needling Treatment: Pre-treatment instruction: Patient instructed on dry needling rationale, procedures, and possible side effects including pain during treatment (achy,cramping feeling), bruising, drop of blood, lightheadedness, nausea, sweating. Patient Consent Given: Yes Education handout provided: Previously provided Muscles treated:R  piriformis and  gluteals  R QL Needle size and number: .30x61m x 2 Electrical stimulation performed: No Parameters: N/A Treatment response/outcome: Twitch response elicited and Palpable decrease in muscle tension Post-treatment instructions: Patient instructed to expect possible mild to moderate muscle soreness later today and/or tomorrow. Patient instructed in methods to reduce muscle soreness and to continue prescribed HEP. If patient was dry needled over the lung field, patient was instructed on signs and symptoms of pneumothorax and, however unlikely, to see immediate medical attention should they occur. Patient was also educated on signs and symptoms of infection and to seek medical attention should they occur. Patient verbalized understanding of these instructions and education.   Modalities: Moist hot pack concurrent with exercise and manual  [facility] Adult PT Treatment:                                                DATE: 09-13-21 Therapeutic Exercise: Reverse lunge 1 x 10 on R and on L VC for technique Brdge with  ball and 25 # KB 3 x 10 Single leg bridge R 1 x 10 and then L 1 x 10 Supine Piriformis Stretch with Foot on Ground  3 reps  30 sec hold R  side Supine Figure 4 Piriformis Stretch  3 reps - 30 sec hold R side Box squat 25 # KB 1 x 15 Chair 18 inches tall, Step ups with mod a with PT in one minute Left 10x and R 10 x Manual Therapy:  STW of R gluteals, and piriformis  Myofascial release of R QL and for piriformis  MET for R SI, trunk rotation thrust R rotated forward Trigger Point Dry Needling Treatment: Pre-treatment instruction: Patient instructed on dry needling rationale, procedures, and possible side effects including pain during treatment (achy,cramping feeling), bruising, drop of blood, lightheadedness, nausea, sweating. Patient Consent Given: Yes Education handout provided: Previously provided Muscles treated: R Gluteals/ R piriformis  Needle size and number: .30x799mx 2 Electrical stimulation performed: No Parameters: N/A Treatment response/outcome: Twitch response elicited and Palpable decrease in muscle tension Post-treatment instructions: Patient instructed to expect possible mild to moderate muscle soreness later today and/or tomorrow. Patient instructed in methods to reduce muscle soreness and to continue prescribed HEP. If patient was dry needled over the lung field, patient was instructed on signs and symptoms of pneumothorax and, however unlikely, to see immediate medical attention should they occur. Patient was also educated on signs and symptoms of infection and to seek medical attention should they occur. Patient verbalized understanding of these instructions and education.   [facility]                                                [facility]: [facility]  Therapeutic Exercise:  Recmbent bike 6 min level 3 RPE 5/6 Hip Hinge with Dowel x 15 Hip Hinge with External cue of wall x 15  Deadlift  15 # KB on 8 inch elevated box x 15 Deadlift 15 # KB facing 8 in elevated box x 15 Box squat 15 # KB 2 x 10 Mat 19 inches tall, Step ups with mod a with PT in one minute Left 11 x and R 13 x Childs pose exercise  forward and rocking to side  Self Care: Taught self mob of R SI by sitting with left leg max hip flex/knee flex and rotating trunk to R Education on proper lifting Resisted hamstring on R with dowel rod and return demo   [facility] Adult PT Treatment:                                                DATE: 08-30-21 Noted R pelvic level elevated - tightened QL Therapeutic Exercise: Supine Piriformis Stretch with Foot on Ground  3 reps  30 sec hold R side Abdominal setting 10 sec  hold x 10  Isometric Hold with pelvic floor (hooklying) 1 x 10 sec hold pelvic tilt 2 x 10 supine knee march 3 x 10 with abdominal engagement Knee drop 2 x 10 with abd engagement Supine Figure 4 Piriformis Stretch  3 reps - 30 sec hold R side Clamshell with Resistance  -3 sets - 10 reps Supine Bridge with Mini Swiss Ball Between Knees 3 sets - 10 reps Supine Lower Trunk Rotation  5 reps - 20 sec  hold on R and then L Manual Therapy: STW - to  Right QL and R buttocks/piriformis  Myofascial release of R QL and for piriformis  MET for R SI, trunk rotation thrust R rotated forward Trigger Point Dry Needling Treatment: Pre-treatment instruction: Patient instructed on dry needling rationale, procedures, and possible side effects including pain during treatment (achy,cramping feeling), bruising, drop of blood, lightheadedness, nausea, sweating. Patient Consent Given: Yes Education handout provided: Yes Muscles treated:Right  piriformis, R gluteals and R Quadratus Lumborum  Needle size and number: .30x79m x 4 Electrical stimulation performed: No Parameters: N/A Treatment response/outcome: Twitch response elicited and Palpable decrease in muscle tension Post-treatment instructions: Patient instructed to expect possible mild to moderate muscle soreness later today and/or tomorrow. Patient instructed in methods to reduce muscle soreness and to continue prescribed HEP. If patient was dry needled over the lung field, patient was  instructed on signs and symptoms of pneumothorax and, however unlikely, to see immediate medical attention should they occur. Patient was also educated on signs and symptoms of infection and to seek medical attention should they occur. Patient verbalized understanding of these instructions and education.   Modalities: Moist hot pack [facility] Adult PT Treatment:                                                DATE: 08-27-21 Therapeutic Exercise: -Supine Piriformis Stretch with Foot on Ground  3 reps  30 sec hold R side - Supine Figure 4 Piriformis Stretch  3 reps - 30 sec hold R side - Clamshell with Resistance  -3 sets - 10 reps - Supine Bridge with Mini [employer]  Between Knees 3 sets - 10 reps - Supine Lower Trunk Rotation  5 reps - 20 sec  hold on R and then L Manual Therapy: STW - to  Right QL and R buttocks/piriformis  Myofascial release of R QL and for piriformis Trigger Point Dry Needling Treatment: Pre-treatment instruction: Patient instructed on dry needling rationale, procedures, and possible side effects including pain during treatment (achy,cramping feeling), bruising, drop of blood, lightheadedness, nausea, sweating. Patient Consent Given: Yes Education handout provided: Yes Muscles treated: R piriformis, R gluteals and R Qaudratus Lumborum  Needle size and number: .30x4m x 5 Electrical stimulation performed: No Parameters: N/A Treatment response/outcome: Twitch response elicited and Palpable decrease in muscle tension Post-treatment instructions: Patient instructed to expect possible mild to moderate muscle soreness later today and/or tomorrow. Patient instructed in methods to reduce muscle soreness and to continue prescribed HEP. If patient was dry needled over the lung field, patient was instructed on signs and symptoms of pneumothorax and, however unlikely, to see immediate medical attention should they occur. Patient was also educated on signs and symptoms of infection and to  seek medical attention should they occur. Patient verbalized understanding of these instructions and education.   Modalities: Moist hot pack  Issue initial HEP MET for R SI joint     PATIENT EDUCATION:  Education details: POC, explanation of LEFS, iniital HEP Explanation of findings education on TPDN precautions and aftercare Person educated: Patient Education method: Explanation, Demonstration, Tactile cues, Verbal cues, and Handouts Education comprehension: verbalized understanding, returned demonstration, verbal cues required, tactile cues required, and needs further education     HOME EXERCISE PROGRAM: [facility] [email or web address] Date: 08/20/2021 Prepared by: LVoncille Lo  Program Notes sit in chair with hands under R thigh. Press/resist into hands for hamstring resistance to decrease pain in R Sacral Iliac area   Exercises - Supine Piriformis Stretch with Foot on Ground  - 1-2 x daily - 7 x weekly - 1 sets - 3 reps - 10-15 hold - Supine Figure 4 Piriformis Stretch  - 1-2 x daily - 7 x weekly - 1 sets - 3 reps - 10-15 hold  added 08-27-21 - Clamshell with Resistance  - 1 x daily - 7 x weekly - 3 sets - 10 reps - Supine Bridge with Mini Swiss Ball Between Knees  - 1 x daily - 7 x weekly - 3 sets - 10 reps - Supine Lower Trunk Rotation  - 2 x daily - 7 x weekly - 1 sets - 5 reps - 20 sec  hold  Added 08-30-21 Abdominal setting 10 sec  hold x 10  Isometric Hold with pelvic floor (hooklying) 1 x 10 sec hold pelvic tilt 2 x 10 supine knee march 3 x 10 with abdominal engagement Knee drop 2 x 10 with abd engagement Added 09-06-21 - Quadruped Rocking Slow  - 1 x daily - 7 x weekly - 3 sets - 10 reps - Standing Hip Hinge with Dowel  - 1 x daily - 7 x weekly - 3 sets - 10 reps - Goblet Squat with Kettlebell  - 1 x daily - 7 x weekly - 3 sets - 10 reps - Kettlebell Deadlift  - 1 x daily - 7 x weekly - 3 sets - Step up on 18 inch step  - 1 x  daily - 7 x weekly - 2 sets - 10 reps  Added 09-13-21  - Reverse Lunge  - 1 x daily - 7 x  weekly - 3 sets - 10 reps - Single Leg Bridge  - 1 x daily - 7 x weekly - 3 sets - 10 reps ASSESSMENT:   CLINICAL IMPRESSION: Ms Angelo enters clinic late but able to do exercises briefly and consented to [facility].  Pt was at the beach and was able to walk on the beach  She comes I with 3/10 pain slightly in R buttock.  Some tightness in R QL needed. Pt RX TPDN and  strengthening exercise to decrease pain. LEFS-  57/80 71.3% improved from eval.   Pt is able to kneel and walk for 20-25 minutes with 3/10 pain or less now.  All STG met or partially met. LTG # 2, 4 and 5 met this session    EVAL- Patient is a 46 y.o. female who was seen today for physical therapy evaluation and treatment for R SI/ piriformis pain which limits her ability to perform household chores, gardening and riding bicycles with her children.  Pt has decreased mobility in segmental flexion sacrum at lumbosacral junction.  Pt with limited hip mobility in hip flexion.  Pt has difficulty standing more than 10 minutes or pressing the accelerator of car without pain up to 8/10.  Pt will benefit from skilled PT to address deficits and reduce pain for more comfortable     OBJECTIVE IMPAIRMENTS Abnormal gait, decreased activity tolerance, decreased mobility, decreased ROM, decreased strength, hypomobility, impaired flexibility, improper body mechanics, postural dysfunction, obesity, and pain.    ACTIVITY LIMITATIONS cleaning, driving, meal prep, yard work, and shopping.    PERSONAL FACTORS Fitness are also affecting patient's functional outcome.  Pt has not been able to exercise due to this SI/piriformis pain and would like to return to running eventually but would like to at least return to biking with children     REHAB POTENTIAL: Excellent   CLINICAL DECISION MAKING: Evolving/moderate complexity   EVALUATION COMPLEXITY: Moderate      GOALS: Goals reviewed with patient? Yes   SHORT TERM GOALS: Target date: 09/10/2021   Pt will be independent with initial HEP Baseline:given initial HEP Goal status: MET   2.  Pt will reduce pain during  standing to 5/10 from 8/10 and stand for 15 minutes Baseline: Pt with 8/10 pain if walking standing more than 10 minutes 08-30-21 5/10 for 15 to 20 minutes 09-06-21  4/10 to 3/10 Goal status: MET   3.  Pt will be able to press accelerator of car without exacerbating R SI pain Baseline: Pt with pain pressing accelerator of car 5/10 to 8/10 when R SI irritated 09-06-21 worked on strengthening to step down from large truck. 09-18-21 3/10 Goal status: Partially met   4.  Pt will begin home walking program daily for 15 minutes minimum Baseline: Pt not routinely exercising  now 08-30-21 starting now 10 min stop and then 10 min5-16-23  I am now walking  about 20-25 minutes and I dont have to stop Goal status: MET       LONG TERM GOALS: Target date: 10/01/2021   Pt will be independent with advanced HEP Baseline:  Goal status: ONgoing   2.  Patient will report improved functional level on LEFS >/= 70/80 in order to allow for return to Baseline: LEFS 36/80 x 100 =45 % 09-18-21 09-18-21  57/80 71.3% Goal status: MET   3.  Pt will be able to ride bikes with children without exacerbating pain greater than 2/10  Baseline: Pt now has pain at rest 4/10  and up to 8/10 with activity  I have some pain still 5/10 because seat is tiny seat Goal status: ongoing   4.  Pt will be able to get into kneeling positions for gardening Baseline: Pt with pain up to 8/10 and avoids steps and kneeling, 09-17-21 3/10 when kneeling pain improving Goal status:  MET   5.  Pt will be consistent with home walking program at least 7000 steps 4 days a week Baseline: No routine exercise at this point 09-18-21 pt is now walking about 20-25 minutes a day Goal status: MET   6.  Pt will increase ER and hip flexion of R LE in  order to more easily don clothing and kneel for gardening Baseline: Pt with pain with hip flex past 95 degrees and when kneeling Goal status:ongoing     PLAN: PT FREQUENCY: 2x/week   PT DURATION: 6 weeks   PLANNED INTERVENTIONS: Therapeutic exercises, Therapeutic activity, Neuromuscular re-education, Gait training, Patient/Family education, Joint mobilization, Dry Needling, Electrical stimulation, Spinal mobilization, Cryotherapy, Moist heat, Taping, and Manual therapy.   PLAN FOR NEXT SESSION:   use bike and get and tolerate.  And work on strength     [place] one: Rehabilitative   Standardized Assessment or Functional Outcome Tool: See Pain Assessment and LEFS   Score or Percent Disability: 36/80 x 100  = 45%   Body Parts Treated (Select each separately):  Lumbopelvic. Overall deficits/functional limitations for body part selected: moderate Hip  R  Overall deficits/functional limitations for body part selected Moderate       If treatment provided at initial evaluation, no treatment charged due to lack of authorization.   Voncille Lo, PT, [place] Certified Exercise Expert for the Aging Adult  09/18/21 9:36 AM Phone: [phone] Fax: [phone]   Voncille Lo, [place], [place] Certified Exercise Expert for the Aging Adult  10/16/21 7:55 AM Phone: [phone] Fax: [phone]

## 2021-09-19 ENCOUNTER — Other Ambulatory Visit: Payer: Self-pay | Admitting: Internal Medicine

## 2021-09-20 ENCOUNTER — Telehealth (INDEPENDENT_AMBULATORY_CARE_PROVIDER_SITE_OTHER): Payer: Medicaid Other | Admitting: Family Medicine

## 2021-09-20 ENCOUNTER — Encounter (INDEPENDENT_AMBULATORY_CARE_PROVIDER_SITE_OTHER): Payer: Self-pay | Admitting: Family Medicine

## 2021-09-20 ENCOUNTER — Encounter: Payer: Self-pay | Admitting: Physical Therapy

## 2021-09-20 ENCOUNTER — Ambulatory Visit: Payer: Medicaid Other | Admitting: Physical Therapy

## 2021-09-20 DIAGNOSIS — E8881 Metabolic syndrome: Secondary | ICD-10-CM | POA: Diagnosis not present

## 2021-09-20 DIAGNOSIS — G8929 Other chronic pain: Secondary | ICD-10-CM

## 2021-09-20 DIAGNOSIS — M5441 Lumbago with sciatica, right side: Secondary | ICD-10-CM | POA: Diagnosis not present

## 2021-09-20 DIAGNOSIS — M25651 Stiffness of right hip, not elsewhere classified: Secondary | ICD-10-CM | POA: Diagnosis not present

## 2021-09-20 DIAGNOSIS — E88819 Insulin resistance, unspecified: Secondary | ICD-10-CM

## 2021-09-20 DIAGNOSIS — E559 Vitamin D deficiency, unspecified: Secondary | ICD-10-CM | POA: Diagnosis not present

## 2021-09-20 DIAGNOSIS — Z6830 Body mass index (BMI) 30.0-30.9, adult: Secondary | ICD-10-CM | POA: Diagnosis not present

## 2021-09-20 DIAGNOSIS — M6281 Muscle weakness (generalized): Secondary | ICD-10-CM | POA: Diagnosis not present

## 2021-09-20 DIAGNOSIS — E669 Obesity, unspecified: Secondary | ICD-10-CM | POA: Diagnosis not present

## 2021-09-20 MED ORDER — VITAMIN D (ERGOCALCIFEROL) 1.25 MG (50000 UNIT) PO CAPS: 50000.0000 [IU] | ORAL_CAPSULE | ORAL | 0 refills | Status: DC

## 2021-09-20 MED ORDER — METFORMIN HCL 500 MG PO TABS: 500.0000 mg | ORAL_TABLET | Freq: Every day | ORAL | 0 refills | Status: DC

## 2021-09-20 NOTE — Patient Instructions (Signed)
Access Code: [facility] URL: [email or web address] Date: 09/20/2021 Prepared by: Garen Lah  Program Notes sit in chair with hands under R thigh. Press/resist into hands for hamstring resistance to decrease pain in R Sacral Iliac area  Exercises - Supine Piriformis Stretch with Foot on Ground  - 1-2 x daily - 7 x weekly - 1 sets - 3 reps - 10-15 hold - Supine Figure 4 Piriformis Stretch  - 1-2 x daily - 7 x weekly - 1 sets - 3 reps - 10-15 hold - Clamshell with Resistance  - 1 x daily - 7 x weekly - 3 sets - 10 reps - Supine Bridge with Mini Swiss Ball Between Knees  - 1 x daily - 7 x weekly - 3 sets - 10 reps - Supine Lower Trunk Rotation  - 2 x daily - 7 x weekly - 1 sets - 5 reps - 20 sec  hold - Supine Piriformis Stretch Pulling Heel to Hip  - 1-2 x daily - 7 x weekly - 1 sets - 2-3 reps - 30 sec hold - Quadruped Rocking Slow  - 1 x daily - 7 x weekly - 3 sets - 10 reps - Standing Hip Hinge with Dowel  - 1 x daily - 7 x weekly - 3 sets - 10 reps - Goblet Squat with Kettlebell  - 1 x daily - 7 x weekly - 3 sets - 10 reps - Kettlebell Deadlift  - 1 x daily - 7 x weekly - 3 sets - Step up on 18 inch step  - 1 x daily - 7 x weekly - 2 sets - 10 reps - Reverse Lunge  - 1 x daily - 7 x weekly - 3 sets - 10 reps - Single Leg Bridge  - 1 x daily - 7 x weekly - 3 sets - 10 reps - Supine Double Knee to Chest  - 1 x daily - 7 x weekly - 3 sets - 10 reps - Supine Piriformis Stretch Pulling Heel to Hip  - 1 x daily - 7 x weekly - 3 sets - 10 reps - Quadruped Rocking Slow  - 1 x daily - 7 x weekly - 3 sets - 10 reps - Quadruped Forearm Plank with a Mule Kick  - 1 x daily - 7 x weekly - 3 sets - 10 reps - Quadruped Hip Abduction with Resistance Loop  - 1 x daily - 7 x weekly - 3 sets - 10 reps - [place] Deadlift with Dumbbells just past Knees ONLY   - 1 x daily - 7 x weekly - 3 sets - 10 reps  Garen Lah, PT, [facility] Certified Exercise Expert for the Aging Adult  09/20/21  9:12 AM Phone: [phone] Fax: [phone]

## 2021-09-20 NOTE — Therapy (Incomplete)
OUTPATIENT PHYSICAL THERAPY TREATMENT NOTE   Patient Name: Carol Welch MRN: [id number] DOB:12-09-1975, 46 y.o., female Today's Date: 09/20/2021  PCP: Gladys Damme, MD REFERRING PROVIDER: Kinnie Feil, MD  END OF SESSION:          Past Medical History:  Diagnosis Date   Anal fissure    Colitis    Depression    Edema, lower extremity    Fatty liver    Hypothyroidism    Low blood pressure    PONV (postoperative nausea and vomiting)    SVD (spontaneous vaginal delivery) 06/30/2015   Vitamin D deficiency    Past Surgical History:  Procedure Laterality Date   BREAST CYST ASPIRATION Right 02/13/2018   CYSTOSCOPY N/A 12/30/2019   Procedure: CYSTOSCOPY;  Surgeon: Janyth Contes, MD;  Location: [place];  Service: Gynecology;  Laterality: N/A;   ENDOVENOUS ABLATION SAPHENOUS VEIN W/ LASER Right 10/08/2018   endovenous laser ablation right saphenous vein by Deitra Mayo MD   LAPAROSCOPIC VAGINAL HYSTERECTOMY WITH SALPINGECTOMY Bilateral 12/30/2019   Procedure: LAPAROSCOPIC ASSISTED VAGINAL HYSTERECTOMY WITH SALPINGECTOMY;  Surgeon: Janyth Contes, MD;  Location: [place];  Service: Gynecology;  Laterality: Bilateral;   Patient Active Problem List   Diagnosis Date Noted   Class 1 obesity due to excess calories with body mass index (BMI) of 30.0 to 30.9 in adult 08/13/2021   Trapezius muscle strain, right, initial encounter 03/20/2021   Gastroenteritis 10/03/2020   Hand trauma, right, initial encounter 07/18/2020   Encounter for surveillance of abnormal nevi 07/09/2020   Acquired hypothyroidism 07/07/2020   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   Neck pain 01/18/2020   S/P laparoscopic assisted vaginal hysterectomy (LAVH) 12/30/2019   External thrombosed hemorrhoids 09/11/2019   MDD (major depressive disorder) 08/24/2019   Orthostatic hypotension 08/24/2019   Hypothyroidism 07/06/2018    Family history of congenital anomaly 08/27/2016    REFERRING DIAG: M79.18 (ICD-10-CM) - Piriformis muscle pain  THERAPY DIAG:  No diagnosis found.  PERTINENT HISTORY: Colitis, depression, hypothyroidism, vitamin D deficiency, fatty liver  PRECAUTIONS: none  SUBJECTIVE: I am a 3/10 in my R buttock  I still have a slight pinch in R buttock. I have been doing a  lot of gardening and after I do that it is a 7/10.  I do the stretches and it helps but I wish it would just go away.        PAIN:  after gardening I am a 7/10 Are you having pain? Yes: NPRS scale: 3/10 Pain location: R piriformis Pain description: sharp in r buttock Aggravating factors:   Relieving factors:     Aggravating factors: walking and standing hurts the most, gardening, standing for longer than 10 min, sometimes pushing on the accelerator,going up and down steps, I also have pain when riding bicycle so I walk instead of ride Relieving factors: stretches  using heat at home At worst my R side buttock pain ins 8/10  OBJECTIVE: (objective measures completed at initial evaluation unless otherwise dated)     OBJECTIVE:    DIAGNOSTIC FINDINGS:  None for Low back   PATIENT SURVEYS:  LEFS 36/80 x 100  45% 09-18-21  57/80 71.3%   SCREENING FOR RED FLAGS: Bowel or bladder incontinence: No Spinal tumors: No Cauda equina syndrome: No Compression fracture: No Abdominal aneurysm: No   COGNITION:           Overall cognitive status: Within functional limits for tasks assessed  SENSATION: WFL   MUSCLE LENGTH: Hamstrings: Right 65 deg; Left 70 deg     POSTURE:  Forward head and rounded shoulder    R elevated pelvic level  R ASIS more posterior than L   PALPATION: Limited segmental flexion and lumbo sacral junction TTP over R piriformis and over R SI Hypomobility lumbar spine   LUMBAR ROM:    Active  A/PROM  08/20/2021  Flexion Fingertips to bil ankles  Extension Pain with  ext 20 degrees  Right lateral flexion Pain in R SI fingertip to knee jt line  Left lateral flexion Pain in R SI with fingertip to knee jt line  Right rotation 75% no pain  Left rotation 75% no pain   (Blank rows = not tested)   LE ROM:   Active  Right 08/20/2021 Left 08/20/2021  Hip flexion 95 110  Hip extension      Hip abduction      Hip adduction      Hip internal rotation 45 47  Hip external rotation 35 49  Knee flexion      Knee extension      Ankle dorsiflexion      Ankle plantarflexion      Ankle inversion      Ankle eversion       (Blank rows = not tested)   LE MMT:   MMT Right 08/20/2021 Left 08/20/2021  Hip flexion 4- 4+  Hip extension 3+ limited by pain R SI 4  Hip abduction 3- 4  Hip adduction      Hip internal rotation 4 4  Hip external rotation 4- 4-  Knee flexion 5 5  Knee extension 5 5  Ankle dorsiflexion 5 5  Ankle plantarflexion      Ankle inversion      Ankle eversion       (Blank rows = not tested)   LUMBAR SPECIAL TESTS:  Straight leg raise test: Negative, Slump test: Negative, SI Compression/distraction test: Positive, and FABER test: Positive Sacral Spring test positive. Lumbo slyacral stiffiness on right . Stiff R hip flexion to 95 degrees active FUNCTIONAL TESTS:  5 times sit to stand: 9.98 sec   GAIT: Distance walked: 150 Assistive device utilized: None Level of assistance: Complete Independence Comments: Pt with slight antalgic gait on R       TODAY'S TREATMENT  [facility] Adult PT Treatment:                                                DATE: 09-25-21 Therapeutic Exercise: *** Manual Therapy: *** Neuromuscular re-ed: *** Therapeutic Activity: *** Modalities: *** Self Care: ***   [facility] Adult PT Treatment:                                                DATE: 09-20-21 Therapeutic Exercise: Recumbent bike level 6 for 6 minutes RPE 5/6 Box squat 1 x 10 15 # KB Box Squat 2 x 10 with 25 # KB Kettlebell Deadlift  25 # 1 x 8 Step  up on 18 inch step  holding onto PT for balance 15 x R and L each Single Leg Bridge   2 x 10 R and then L Reverse lunge 1  x 10 R and then L Lift bar romanian deadlift to knees with 45 # lift bar. VC and TC mod cuing needed [place] Deadlift with Dumbbells just past Knees ONLY  15 lb DB in each hand x 10 each Quadruped Forearm Plank with a Mule Kick  Quadruped Hip Abduction with GTB Ending with stretching  Standing Hip Hinge with Dowel  1 x 10 VC and TC for correct execution Quadruped Rocking Slow  x 5 forward, and to R and to L VC Supine Double Knee to Chest  Supine Piriformis Stretch Pulling Heel to Hipx 2 x 30 sec stretch on R   Manual Therapy: Left side-lying lumbar sacral thrust with cavitation R QL myofascial/  Therapeutic Activity: Demo of hoeing and shoveling with tools to use legs instead of back and arms Demo lifting principles  and return demo of lifting Working on proper hip hinging for gardening Modalities: Moist heat pack  [facility] Adult PT Treatment:                                                DATE: 09-18-21 09-18-21  57/80 71.3% Therapeutic Exercise: Brdge with  ball Squeezeand 25 # KB 3 x 10 Chair squat 3 x 10 with 25 # KB Reverse lunge 1 x 10 on R and on L  [place] stretch 2 x 30 sec Sitting ER stretch on L and R  30 se each   Manual Therapy:  STW of R gluteals, and piriformis Myofascial release of R QL and for piriformis  MET for R SI, trunk rotation thrust R rotated forward Trigger Point Dry Needling Treatment: Pre-treatment instruction: Patient instructed on dry needling rationale, procedures, and possible side effects including pain during treatment (achy,cramping feeling), bruising, drop of blood, lightheadedness, nausea, sweating. Patient Consent Given: Yes Education handout provided: Previously provided Muscles treated:R  piriformis and  gluteals  R QL Needle size and number: .30x75m x 2 Electrical stimulation performed: No Parameters: N/A Treatment  response/outcome: Twitch response elicited and Palpable decrease in muscle tension Post-treatment instructions: Patient instructed to expect possible mild to moderate muscle soreness later today and/or tomorrow. Patient instructed in methods to reduce muscle soreness and to continue prescribed HEP. If patient was dry needled over the lung field, patient was instructed on signs and symptoms of pneumothorax and, however unlikely, to see immediate medical attention should they occur. Patient was also educated on signs and symptoms of infection and to seek medical attention should they occur. Patient verbalized understanding of these instructions and education.   Modalities: Moist hot pack concurrent with exercise and manual  [facility] Adult PT Treatment:                                                DATE: 09-13-21 Therapeutic Exercise: Reverse lunge 1 x 10 on R and on L VC for technique Brdge with  ball and 25 # KB 3 x 10 Single leg bridge R 1 x 10 and then L 1 x 10 Supine Piriformis Stretch with Foot on Ground  3 reps  30 sec hold R side Supine Figure 4 Piriformis Stretch  3 reps - 30 sec hold R side Box squat 25 # KB 1 x 15 Chair 18  inches tall, Step ups with mod a with PT in one minute Left 10x and R 10 x Manual Therapy:  STW of R gluteals, and piriformis  Myofascial release of R QL and for piriformis  MET for R SI, trunk rotation thrust R rotated forward Trigger Point Dry Needling Treatment: Pre-treatment instruction: Patient instructed on dry needling rationale, procedures, and possible side effects including pain during treatment (achy,cramping feeling), bruising, drop of blood, lightheadedness, nausea, sweating. Patient Consent Given: Yes Education handout provided: Previously provided Muscles treated: R Gluteals/ R piriformis  Needle size and number: .30x39m x 2 Electrical stimulation performed: No Parameters: N/A Treatment response/outcome: Twitch response elicited and Palpable decrease  in muscle tension Post-treatment instructions: Patient instructed to expect possible mild to moderate muscle soreness later today and/or tomorrow. Patient instructed in methods to reduce muscle soreness and to continue prescribed HEP. If patient was dry needled over the lung field, patient was instructed on signs and symptoms of pneumothorax and, however unlikely, to see immediate medical attention should they occur. Patient was also educated on signs and symptoms of infection and to seek medical attention should they occur. Patient verbalized understanding of these instructions and education.   [facility]                                                [facility]: [facility] Therapeutic Exercise:  Recmbent bike 6 min level 3 RPE 5/6 Hip Hinge with Dowel x 15 Hip Hinge with External cue of wall x 15  Deadlift  15 # KB on 8 inch elevated box x 15 Deadlift 15 # KB facing 8 in elevated box x 15 Box squat 15 # KB 2 x 10 Mat 19 inches tall, Step ups with mod a with PT in one minute Left 11 x and R 13 x Childs pose exercise forward and rocking to side  Self Care: Taught self mob of R SI by sitting with left leg max hip flex/knee flex and rotating trunk to R Education on proper lifting Resisted hamstring on R with dowel rod and return demo   [facility]                                                [facility]: [facility]21 Noted R pelvic level elevated - tightened QL Therapeutic Exercise: Supine Piriformis Stretch with Foot on Ground  3 reps  30 sec hold R side Abdominal setting 10 sec  hold x 10  Isometric Hold with pelvic floor (hooklying) 1 x 10 sec hold pelvic tilt 2 x 10 supine knee march 3 x 10 with abdominal engagement Knee drop 2 x 10 with abd engagement Supine Figure 4 Piriformis Stretch  3 reps - 30 sec hold R side Clamshell with Resistance  -3 sets - 10 reps Supine Bridge with Mini Swiss Ball Between Knees 3 sets - 10 reps Supine Lower Trunk Rotation  5 reps - 20 sec  hold on  R and then L Manual Therapy: STW - to  Right QL and R buttocks/piriformis  Myofascial release of R QL and for piriformis  MET for R SI, trunk rotation thrust R rotated forward Trigger Point Dry Needling Treatment: Pre-treatment instruction: Patient instructed on  dry needling rationale, procedures, and possible side effects including pain during treatment (achy,cramping feeling), bruising, drop of blood, lightheadedness, nausea, sweating. Patient Consent Given: Yes Education handout provided: Yes Muscles treated:Right  piriformis, R gluteals and R Quadratus Lumborum  Needle size and number: .30x7m x 4 Electrical stimulation performed: No Parameters: N/A Treatment response/outcome: Twitch response elicited and Palpable decrease in muscle tension Post-treatment instructions: Patient instructed to expect possible mild to moderate muscle soreness later today and/or tomorrow. Patient instructed in methods to reduce muscle soreness and to continue prescribed HEP. If patient was dry needled over the lung field, patient was instructed on signs and symptoms of pneumothorax and, however unlikely, to see immediate medical attention should they occur. Patient was also educated on signs and symptoms of infection and to seek medical attention should they occur. Patient verbalized understanding of these instructions and education.   Modalities: Moist hot pack [facility] Adult PT Treatment:                                                DATE: 08-27-21 Therapeutic Exercise: -Supine Piriformis Stretch with Foot on Ground  3 reps  30 sec hold R side - Supine Figure 4 Piriformis Stretch  3 reps - 30 sec hold R side - Clamshell with Resistance  -3 sets - 10 reps - Supine Bridge with Mini Swiss Ball Between Knees 3 sets - 10 reps - Supine Lower Trunk Rotation  5 reps - 20 sec  hold on R and then L Manual Therapy: STW - to  Right QL and R buttocks/piriformis  Myofascial release of R QL and for piriformis Trigger  Point Dry Needling Treatment: Pre-treatment instruction: Patient instructed on dry needling rationale, procedures, and possible side effects including pain during treatment (achy,cramping feeling), bruising, drop of blood, lightheadedness, nausea, sweating. Patient Consent Given: Yes Education handout provided: Yes Muscles treated: R piriformis, R gluteals and R Qaudratus Lumborum  Needle size and number: .30x761mx 5 Electrical stimulation performed: No Parameters: N/A Treatment response/outcome: Twitch response elicited and Palpable decrease in muscle tension Post-treatment instructions: Patient instructed to expect possible mild to moderate muscle soreness later today and/or tomorrow. Patient instructed in methods to reduce muscle soreness and to continue prescribed HEP. If patient was dry needled over the lung field, patient was instructed on signs and symptoms of pneumothorax and, however unlikely, to see immediate medical attention should they occur. Patient was also educated on signs and symptoms of infection and to seek medical attention should they occur. Patient verbalized understanding of these instructions and education.   Modalities: Moist hot pack  Issue initial HEP MET for R SI joint     PATIENT EDUCATION:  Education details: POC, explanation of LEFS, iniital HEP Explanation of findings education on TPDN precautions and aftercare Person educated: Patient Education method: Explanation, Demonstration, Tactile cues, Verbal cues, and Handouts Education comprehension: verbalized understanding, returned demonstration, verbal cues required, tactile cues required, and needs further education     HOME EXERCISE PROGRAM:  updated 09-20-21 Access [facility]: [email or web address]RL: [email or web address] Date: 09/20/2021 Prepared by: LaVoncille LoProgram Notes sit in chair with hands under R thigh. Press/resist into hands for hamstring resistance to decrease pain in R Sacral Iliac  area  Exercises - Supine Piriformis Stretch with Foot on Ground  - 1-2 x daily - 7 x weekly - 1 sets - 3  reps - 10-15 hold - Supine Figure 4 Piriformis Stretch  - 1-2 x daily - 7 x weekly - 1 sets - 3 reps - 10-15 hold - Clamshell with Resistance  - 1 x daily - 7 x weekly - 3 sets - 10 reps - Supine Bridge with Mini Swiss Ball Between Knees  - 1 x daily - 7 x weekly - 3 sets - 10 reps - Supine Lower Trunk Rotation  - 2 x daily - 7 x weekly - 1 sets - 5 reps - 20 sec  hold - Supine Piriformis Stretch Pulling Heel to Hip  - 1-2 x daily - 7 x weekly - 1 sets - 2-3 reps - 30 sec hold - Quadruped Rocking Slow  - 1 x daily - 7 x weekly - 3 sets - 10 reps - Standing Hip Hinge with Dowel  - 1 x daily - 7 x weekly - 3 sets - 10 reps - Goblet Squat with Kettlebell  - 1 x daily - 7 x weekly - 3 sets - 10 reps - Kettlebell Deadlift  - 1 x daily - 7 x weekly - 3 sets - Step up on 18 inch step  - 1 x daily - 7 x weekly - 2 sets - 10 reps - Reverse Lunge  - 1 x daily - 7 x weekly - 3 sets - 10 reps - Single Leg Bridge  - 1 x daily - 7 x weekly - 3 sets - 10 reps - Supine Double Knee to Chest  - 1 x daily - 7 x weekly - 3 sets - 10 reps - Supine Piriformis Stretch Pulling Heel to Hip  - 1 x daily - 7 x weekly - 3 sets - 10 reps - Quadruped Rocking Slow  - 1 x daily - 7 x weekly - 3 sets - 10 reps - Quadruped Forearm Plank with a Mule Kick  - 1 x daily - 7 x weekly - 3 sets - 10 reps - Quadruped Hip Abduction with Resistance Loop  - 1 x daily - 7 x weekly - 3 sets - 10 reps - Benin Deadlift with Dumbbells just past Knees ONLY   - 1 x daily - 7 x weekly - 3 sets - 10 reps ASSESSMENT:   CLINICAL IMPRESSION:  Ms Mole enters  clinic with 3/10 pain in R SI.  Pt states she has been gardening and irritating back and pt corrected by PT for proper principles of lifting and utilizing shovels and hoes in gardening with return demo.  Advanced HEP for use at home.  Pt needs reinforcement  of HEP and will be able to  handle Low back pain with HEP. Pt was sore but 1/10 at end of session after RX today.  Pt given written handout.          OBJECTIVE IMPAIRMENTS Abnormal gait, decreased activity tolerance, decreased mobility, decreased ROM, decreased strength, hypomobility, impaired flexibility, improper body mechanics, postural dysfunction, obesity, and pain.    ACTIVITY LIMITATIONS cleaning, driving, meal prep, yard work, and shopping.    PERSONAL FACTORS Fitness are also affecting patient's functional outcome.  Pt has not been able to exercise due to this SI/piriformis pain and would like to return to running eventually but would like to at least return to biking with children     REHAB POTENTIAL: Excellent   CLINICAL DECISION MAKING: Evolving/moderate complexity   EVALUATION COMPLEXITY: Moderate     GOALS: Goals reviewed with patient? Yes  SHORT TERM GOALS: Target date: 09/10/2021   Pt will be independent with initial HEP Baseline:given initial HEP Goal status: MET   2.  Pt will reduce pain during  standing to 5/10 from 8/10 and stand for 15 minutes Baseline: Pt with 8/10 pain if walking standing more than 10 minutes 08-30-21 5/10 for 15 to 20 minutes 09-06-21  4/10 to 3/10 Goal status: MET   3.  Pt will be able to press accelerator of car without exacerbating R SI pain Baseline: Pt with pain pressing accelerator of car 5/10 to 8/10 when R SI irritated 09-06-21 worked on strengthening to step down from large truck. 09-18-21 3/10 Goal status: Partially met   4.  Pt will begin home walking program daily for 15 minutes minimum Baseline: Pt not routinely exercising  now 08-30-21 starting now 10 min stop and then 10 min5-16-23  I am now walking  about 20-25 minutes and I dont have to stop Goal status: MET       LONG TERM GOALS: Target date: 10/01/2021   Pt will be independent with advanced HEP Baseline:  Goal status: ONgoing   2.  Patient will report improved functional level on LEFS >/=  70/80 in order to allow for return to Baseline: LEFS 36/80 x 100 =45 % 09-18-21 09-18-21  57/80 71.3% Goal status: MET   3.  Pt will be able to ride bikes with children without exacerbating pain greater than 2/10  Baseline: Pt now has pain at rest 4/10 and up to 8/10 with activity  I have some pain still 5/10 because seat is tiny seat Goal status: ongoing   4.  Pt will be able to get into kneeling positions for gardening Baseline: Pt with pain up to 8/10 and avoids steps and kneeling, 09-17-21 3/10 when kneeling pain improving Goal status:  MET   5.  Pt will be consistent with home walking program at least 7000 steps 4 days a week Baseline: No routine exercise at this point 09-18-21 pt is now walking about 20-25 minutes a day Goal status: MET   6.  Pt will increase ER and hip flexion of R LE in order to more easily don clothing and kneel for gardening Baseline: Pt with pain with hip flex past 95 degrees and when kneeling Goal status:ongoing     PLAN: PT FREQUENCY: 2x/week   PT DURATION: 6 weeks   PLANNED INTERVENTIONS: Therapeutic exercises, Therapeutic activity, Neuromuscular re-education, Gait training, Patient/Family education, Joint mobilization, Dry Needling, Electrical stimulation, Spinal mobilization, Cryotherapy, Moist heat, Taping, and Manual therapy.   PLAN FOR NEXT SESSION:   use bike and get and tolerate. Reinforce HEP     [employer] Authorization    Choose one: Rehabilitative   Standardized Assessment or Functional Outcome Tool: See Pain Assessment and LEFS   Score or Percent Disability: 36/80 x 100  = 45%   Body Parts Treated (Select each separately):  Lumbopelvic. Overall deficits/functional limitations for body part selected: moderate Hip  R  Overall deficits/functional limitations for body part selected Moderate       If treatment provided at initial evaluation, no treatment charged due to lack of authorization.    ***

## 2021-09-20 NOTE — Progress Notes (Signed)
TeleHealth Visit:  This visit was completed with telemedicine (audio/video) technology. Carol Welch has verbally consented to this TeleHealth visit. The patient is located at home, the provider is located at home. The participants in this visit include the listed provider and patient. The visit was conducted today via MyChart video.  OBESITY Carol Welch is here to discuss her progress with her obesity treatment plan along with follow-up of her obesity related diagnoses.   Today's visit was # 4 Starting weight: 167 lbs Starting date: 08/08/2021 Weight at last in office visit: 165 lbs on 08/23/21 Total weight loss: 2 lbs at last in office visit on 08/23/21. Today's reported weight: 166.4 lbs.  Nutrition Plan: the Category 1 Plan.  Hunger is well controlled. Cravings are well controlled.  Current exercise: walking 20-25 minutes 5 days  Interim History: Carol Welch has worked on increasing her protein as discussed at last visit.  She has added in cottage cheese to supplement.  She is able to tolerate fish and chicken but all other meats make her feel sick.  She notes less hunger overall since increasing protein. However she is frustrated with lack of weight loss.  She reports she has always had to watch her weight very closely and it is extremely hard for her to lose weight. Food recall: Lunch-2 eggs cooked in a small amount of olive oil, toast Snack-1 to 2 ounces of almonds Lunch-6 ounces of cottage cheese and 3 crackers Snack-orange Dinner-2 fish tacos on homemade tortillas with vegetables.  Assessment/Plan:  1. Insulin Resistance Carol Welch has had elevated fasting insulin readings. Goal is HgbA1c < 5.7, fasting insulin closer to 5.   Denies polyphagia. Medication(s): Metformin 500 mg daily with breakfast.  She has had a headache almost every day since starting the metformin.   Lab Results  Component Value Date   HGBA1C 5.6 08/08/2021   Lab Results  Component Value Date   INSULIN 5.7  08/08/2021    Plan Refill:  metFORMIN (GLUCOPHAGE) 500 MG tablet    Sig: Take 1 tablet (500 mg total) by mouth daily with breakfast.    Dispense:  30 tablet    Refill:  0  Take 1/2 pill (250 mg) daily.  2. Vitamin D Deficiency Vitamin D is not at goal of 50. She is on weekly prescription Vitamin D 50,000 IU.  Lab Results  Component Value Date   VD25OH 37.3 08/08/2021   VD25OH 41.65 12/05/2020   Plan: Refill:  Vitamin D, Ergocalciferol, (DRISDOL) 1.25 MG (50000 UNIT) CAPS capsule    Sig: Take 1 capsule (50,000 Units total) by mouth every 7 (seven) days.    Dispense:  5 capsule    Refill:  0   3. Obesity: Current BMI 30.17 Carol Welch is currently in the action stage of change. As such, her goal is to continue with weight loss efforts.  She has agreed to keeping a food journal and adhering to recommended goals of 1000-1100 calories and 75 gms protein.   Exercise goals: as is.  Behavioral modification strategies: increasing lean protein intake, meal planning and cooking strategies, and keeping a strict food journal.  Information for journaling and protein content of food handout provided via MyChart. Measure portion sizes. May journal on paper or use Lose It!  Carol Welch has agreed to follow-up with our clinic in 2 weeks.   No orders of the defined types were placed in this encounter.   Medications Discontinued During This Encounter  Medication Reason   Vitamin D, Ergocalciferol, (DRISDOL) 1.25 MG (50000 UNIT)  CAPS capsule Reorder   metFORMIN (GLUCOPHAGE) 500 MG tablet Reorder     Meds ordered this encounter  Medications   metFORMIN (GLUCOPHAGE) 500 MG tablet    Sig: Take 1 tablet (500 mg total) by mouth daily with breakfast.    Dispense:  30 tablet    Refill:  0    Order Specific Question:   Supervising Provider    Answer:   Quillian Quince D [[id number]]   Vitamin D, Ergocalciferol, (DRISDOL) 1.25 MG (50000 UNIT) CAPS capsule    Sig: Take 1 capsule (50,000 Units total) by  mouth every 7 (seven) days.    Dispense:  5 capsule    Refill:  0    Order Specific Question:   Supervising Provider    Answer:   Quillian Quince D [[id number]]      Objective:   VITALS: Per patient if applicable, see vitals. GENERAL: Alert and in no acute distress. CARDIOPULMONARY: No increased WOB. Speaking in clear sentences.  PSYCH: Pleasant and cooperative. Speech normal rate and rhythm. Affect is appropriate. Insight and judgement are appropriate. Attention is focused, linear, and appropriate.  NEURO: Oriented as arrived to appointment on time with no prompting.   Lab Results  Component Value Date   CREATININE 0.62 03/13/2021   BUN 14 03/13/2021   NA 136 03/13/2021   K 4.2 03/13/2021   CL 104 03/13/2021   CO2 24 03/13/2021   Lab Results  Component Value Date   ALT 10 05/29/2021   AST 15 05/29/2021   ALKPHOS 48 05/29/2021   BILITOT 0.2 05/29/2021   Lab Results  Component Value Date   HGBA1C 5.6 08/08/2021   Lab Results  Component Value Date   INSULIN 5.7 08/08/2021   Lab Results  Component Value Date   TSH 1.35 03/20/2021   No results found for: CHOL, HDL, LDLCALC, LDLDIRECT, TRIG, CHOLHDL Lab Results  Component Value Date   WBC 6.0 05/29/2021   HGB 13.3 05/29/2021   HCT 38.9 05/29/2021   MCV 88 05/29/2021   PLT 250 05/29/2021   No results found for: IRON, TIBC, FERRITIN Lab Results  Component Value Date   VD25OH 37.3 08/08/2021   VD25OH 41.65 12/05/2020    Attestation Statements:   Reviewed by clinician on day of visit: allergies, medications, problem list, medical history, surgical history, family history, social history, and previous encounter notes.

## 2021-09-25 ENCOUNTER — Ambulatory Visit: Payer: Medicaid Other | Admitting: Physical Therapy

## 2021-09-25 NOTE — Therapy (Addendum)
OUTPATIENT PHYSICAL THERAPY TREATMENT NOTE PHYSICAL THERAPY DISCHARGE SUMMARY  Visits from Start of Care: 8  Current functional level related to goals / functional outcomes: As indicated below   Remaining deficits: Pt has been increasing movement and exercise load but continues to have 3/10 pinching pain in R SI and around piriformis. She does feel better after exercise but the specific pain never does go to 0/10   Education / Equipment: HEP   Patient agrees to discharge. Patient goals were met. Patient is being discharged due to  Pt compliant with current HEP  and has met all LTG for this session,but needs additional PT with pelvic floor PT.  PT has requested from Dr Chauncey Reading for new evaluation with pelvic PT to address residual pain.   Patient Name: Carol Welch MRN: [id number] DOB:04/27/1976, 46 y.o., female Today's Date: 09/27/2021  PCP: Gladys Damme, MD REFERRING PROVIDER: Gladys Damme, MD  END OF SESSION:   PT End of Session - 09/27/21 0829     Visit Number 8    Number of Visits 13    Date for PT Re-Evaluation 10/01/21    Authorization Type [employer]  No tx, estimunattended, VASO or ionto    PT Start Time 0808    PT Stop Time 0846    PT Time Calculation (min) 38 min    Activity Tolerance Patient tolerated treatment well    Behavior During Therapy [facility] for tasks assessed/performed                   Past Medical History:  Diagnosis Date   Anal fissure    Colitis    Depression    Edema, lower extremity    Fatty liver    Hypothyroidism    Low blood pressure    PONV (postoperative nausea and vomiting)    SVD (spontaneous vaginal delivery) 06/30/2015   Vitamin D deficiency    Past Surgical History:  Procedure Laterality Date   BREAST CYST ASPIRATION Right 02/13/2018   CYSTOSCOPY N/A 12/30/2019   Procedure: CYSTOSCOPY;  Surgeon: Janyth Contes, MD;  Location: [place];  Service: Gynecology;  Laterality: N/A;    ENDOVENOUS ABLATION SAPHENOUS VEIN W/ LASER Right 10/08/2018   endovenous laser ablation right saphenous vein by Deitra Mayo MD   LAPAROSCOPIC VAGINAL HYSTERECTOMY WITH SALPINGECTOMY Bilateral 12/30/2019   Procedure: LAPAROSCOPIC ASSISTED VAGINAL HYSTERECTOMY WITH SALPINGECTOMY;  Surgeon: Janyth Contes, MD;  Location: [place];  Service: Gynecology;  Laterality: Bilateral;   Patient Active Problem List   Diagnosis Date Noted   Class 1 obesity due to excess calories with body mass index (BMI) of 30.0 to 30.9 in adult 08/13/2021   Trapezius muscle strain, right, initial encounter 03/20/2021   Gastroenteritis 10/03/2020   Hand trauma, right, initial encounter 07/18/2020   Encounter for surveillance of abnormal nevi 07/09/2020   Acquired hypothyroidism 07/07/2020   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   Neck pain 01/18/2020   S/P laparoscopic assisted vaginal hysterectomy (LAVH) 12/30/2019   External thrombosed hemorrhoids 09/11/2019   MDD (major depressive disorder) 08/24/2019   Orthostatic hypotension 08/24/2019   Hypothyroidism 07/06/2018   Family history of congenital anomaly 08/27/2016    REFERRING DIAG: M79.18 (ICD-10-CM) - Piriformis muscle pain  THERAPY DIAG:  Chronic right-sided low back pain with right-sided sciatica  Stiffness of right hip, not elsewhere classified  PERTINENT HISTORY: Colitis, depression, hypothyroidism, vitamin D deficiency, fatty liver  PRECAUTIONS: none  SUBJECTIVE:   I am a 3/10 in my R buttock (  R SI and Piriformis, obturator internus on palpation by PT)       PAIN:  after gardening I am a 7/10 Are you having pain? Yes: NPRS scale: 3/10 Pain location: R piriformis Pain description: sharp in r buttock Aggravating factors:   Relieving factors:     Aggravating factors: walking and standing hurts the most, gardening, standing for longer than 10 min, sometimes pushing on the accelerator,going  up and down steps, I also have pain when riding bicycle so I walk instead of ride Relieving factors: stretches  using heat at home At worst my R side buttock pain ins 8/10  OBJECTIVE: (objective measures completed at initial evaluation unless otherwise dated)     OBJECTIVE:    DIAGNOSTIC FINDINGS:  None for Low back   PATIENT SURVEYS:  LEFS 36/80 x 100  45% 09-18-21  57/80 71.3%   SCREENING FOR RED FLAGS: Bowel or bladder incontinence: No Spinal tumors: No Cauda equina syndrome: No Compression fracture: No Abdominal aneurysm: No   COGNITION:           Overall cognitive status: Within functional limits for tasks assessed                          SENSATION: WFL   MUSCLE LENGTH: Hamstrings: Right 65 deg; Left 70 deg     POSTURE:  Forward head and rounded shoulder    R elevated pelvic level  R ASIS more posterior than L   PALPATION: Limited segmental flexion and lumbo sacral junction TTP over R piriformis and over R SI Hypomobility lumbar spine R SI and Piriformis, obturator internus on palpation by PT) on 09-27-21   LUMBAR ROM:    Active  A/PROM  08/20/2021 A/PROM 09-27-21  Flexion Fingertips to bil ankles Fingertips to bil ankles with pain in R buttock  Extension Pain with ext 20 degrees Pain with ext 30 degrees in RSI  Right lateral flexion Pain in R SI fingertip to knee jt line  NO Pain in R SI fingertip to 1 in below knee jt line  Left lateral flexion Pain in R SI with fingertip to knee jt line Pain in R SI with fingertip to knee jt line  Right rotation 75% no pain WFL  Left rotation 75% no pain WFL   (Blank rows = not tested)   LE ROM:   Active  Right 08/20/2021 Left 08/20/2021 RIGHT 09-27-21  Hip flexion 95 110 102  Hip extension       Hip abduction       Hip adduction       Hip internal rotation 45 47 45  Hip external rotation 35 49 40  Knee flexion       Knee extension       Ankle dorsiflexion       Ankle plantarflexion       Ankle inversion        Ankle eversion        (Blank rows = not tested)   LE MMT:   MMT Right 08/20/2021 Left 08/20/2021 RIGHT 09-27-21 LEFT 09-27-21  Hip flexion 4- 4+ 4+ 5  Hip extension 3+ limited by pain R SI 4 4- 5  Hip abduction 3- 4 4- 5  Hip adduction        Hip internal rotation 4 4 4  Pain 4+  Hip external rotation 4- 4- 4 4  Knee flexion 5 5 5 5   Knee extension 5 5 5  5  Ankle dorsiflexion 5 5 5 5   Ankle plantarflexion        Ankle inversion        Ankle eversion         (Blank rows = not tested)   LUMBAR SPECIAL TESTS:  Straight leg raise test: Negative, Slump test: Negative, SI Compression/distraction test: Positive, and FABER test: Positive Sacral Spring test positive. Lumbo slyacral stiffiness on right . Stiff R hip flexion to 95 degrees active FUNCTIONAL TESTS:  5 times sit to stand: 9.98 sec   GAIT: Distance walked: 150 Assistive device utilized: None Level of assistance: Complete Independence Comments: Pt with slight antalgic gait on R       TODAY'S TREATMENT  [facility] Adult PT Treatment:                                                DATE: 09-27-21 Therapeutic Exercise: AMRAP ( As many reps as possible ) 2 rounds   Min 1 KB sit to stand goblet squat  with 30# Min 2 Latissimus pull with blue theraband 15 x on L and R Min 3 Forward lunge without weights and Upper extremity support Min 4  Step ups on  8 inch step  Reviewed HEP for home use with pt questions Quadruped Forearm Plank with a Mule Kick  Quadruped Hip Abduction with GTB [place] Deadlift with Dumbbells just past Knees ONLY  15 lb DB in each hand x 10 each  Manual Therapy: Left side-lying lumbar sacral thrust with cavitation STW over piriformis  Self Care: Use of tennis ball to for self manual release of piriformis   Discussed need for wider seat on bike for increased comfort [facility] Adult PT Treatment:                                                DATE: 09-20-21 Therapeutic Exercise: Recumbent bike level 6 for 6  minutes RPE 5/6 Box squat 1 x 10 15 # KB Box Squat 2 x 10 with 25 # KB Kettlebell Deadlift  25 # 1 x 8 Step up on 18 inch step  holding onto PT for balance 15 x R and L each Single Leg Bridge   2 x 10 R and then L Reverse lunge 1 x 10 R and then L Lift bar romanian deadlift to knees with 45 # lift bar. VC and TC mod cuing needed [place] Deadlift with Dumbbells just past Knees ONLY  15 lb DB in each hand x 10 each Quadruped Forearm Plank with a Mule Kick  Quadruped Hip Abduction with GTB Ending with stretching  Standing Hip Hinge with Dowel  1 x 10 VC and TC for correct execution Quadruped Rocking Slow  x 5 forward, and to R and to L VC Supine Double Knee to Chest  Supine Piriformis Stretch Pulling Heel to Hipx 2 x 30 sec stretch on R   Manual Therapy: Left side-lying lumbar sacral thrust with cavitation R QL myofascial/  Therapeutic Activity: Demo of hoeing and shoveling with tools to use legs instead of back and arms Demo lifting principles  and return demo of lifting Working on proper hip hinging for gardening Modalities: Moist heat pack  [facility] Adult PT Treatment:  DATE: 09-18-21 09-18-21  57/80 71.3% Therapeutic Exercise: Brdge with  ball Squeezeand 25 # KB 3 x 10 Chair squat 3 x 10 with 25 # KB Reverse lunge 1 x 10 on R and on L  [place] stretch 2 x 30 sec Sitting ER stretch on L and R  30 se each   Manual Therapy:  STW of R gluteals, and piriformis Myofascial release of R QL and for piriformis  MET for R SI, trunk rotation thrust R rotated forward Trigger Point Dry Needling Treatment: Pre-treatment instruction: Patient instructed on dry needling rationale, procedures, and possible side effects including pain during treatment (achy,cramping feeling), bruising, drop of blood, lightheadedness, nausea, sweating. Patient Consent Given: Yes Education handout provided: Previously provided Muscles treated:R  piriformis and   gluteals  R QL Needle size and number: .30x61m x 2 Electrical stimulation performed: No Parameters: N/A Treatment response/outcome: Twitch response elicited and Palpable decrease in muscle tension Post-treatment instructions: Patient instructed to expect possible mild to moderate muscle soreness later today and/or tomorrow. Patient instructed in methods to reduce muscle soreness and to continue prescribed HEP. If patient was dry needled over the lung field, patient was instructed on signs and symptoms of pneumothorax and, however unlikely, to see immediate medical attention should they occur. Patient was also educated on signs and symptoms of infection and to seek medical attention should they occur. Patient verbalized understanding of these instructions and education.   Modalities: Moist hot pack concurrent with exercise and manual  [facility] Adult PT Treatment:                                                DATE: 09-13-21 Therapeutic Exercise: Reverse lunge 1 x 10 on R and on L VC for technique Brdge with  ball and 25 # KB 3 x 10 Single leg bridge R 1 x 10 and then L 1 x 10 Supine Piriformis Stretch with Foot on Ground  3 reps  30 sec hold R side Supine Figure 4 Piriformis Stretch  3 reps - 30 sec hold R side Box squat 25 # KB 1 x 15 Chair 18 inches tall, Step ups with mod a with PT in one minute Left 10x and R 10 x Manual Therapy:  STW of R gluteals, and piriformis  Myofascial release of R QL and for piriformis  MET for R SI, trunk rotation thrust R rotated forward Trigger Point Dry Needling Treatment: Pre-treatment instruction: Patient instructed on dry needling rationale, procedures, and possible side effects including pain during treatment (achy,cramping feeling), bruising, drop of blood, lightheadedness, nausea, sweating. Patient Consent Given: Yes Education handout provided: Previously provided Muscles treated: R Gluteals/ R piriformis  Needle size and number: .30x731mx 2 Electrical  stimulation performed: No Parameters: N/A Treatment response/outcome: Twitch response elicited and Palpable decrease in muscle tension Post-treatment instructions: Patient instructed to expect possible mild to moderate muscle soreness later today and/or tomorrow. Patient instructed in methods to reduce muscle soreness and to continue prescribed HEP. If patient was dry needled over the lung field, patient was instructed on signs and symptoms of pneumothorax and, however unlikely, to see immediate medical attention should they occur. Patient was also educated on signs and symptoms of infection and to seek medical attention should they occur. Patient verbalized understanding of these instructions and education.   [facility]  [facility]: [facility] Therapeutic Exercise:  Recmbent bike 6 min level 3 RPE 5/6 Hip Hinge with Dowel x 15 Hip Hinge with External cue of wall x 15  Deadlift  15 # KB on 8 inch elevated box x 15 Deadlift 15 # KB facing 8 in elevated box x 15 Box squat 15 # KB 2 x 10 Mat 19 inches tall, Step ups with mod a with PT in one minute Left 11 x and R 13 x Childs pose exercise forward and rocking to side  Self Care: Taught self mob of R SI by sitting with left leg max hip flex/knee flex and rotating trunk to R Education on proper lifting Resisted hamstring on R with dowel rod and return demo   [facility] Adult PT Treatment:                                                DATE: 08-30-21 Noted R pelvic level elevated - tightened QL Therapeutic Exercise: Supine Piriformis Stretch with Foot on Ground  3 reps  30 sec hold R side Abdominal setting 10 sec  hold x 10  Isometric Hold with pelvic floor (hooklying) 1 x 10 sec hold pelvic tilt 2 x 10 supine knee march 3 x 10 with abdominal engagement Knee drop 2 x 10 with abd engagement Supine Figure 4 Piriformis Stretch  3 reps - 30 sec hold R side Clamshell with Resistance  -3 sets - 10  reps Supine Bridge with Mini Swiss Ball Between Knees 3 sets - 10 reps Supine Lower Trunk Rotation  5 reps - 20 sec  hold on R and then L Manual Therapy: STW - to  Right QL and R buttocks/piriformis  Myofascial release of R QL and for piriformis  MET for R SI, trunk rotation thrust R rotated forward Trigger Point Dry Needling Treatment: Pre-treatment instruction: Patient instructed on dry needling rationale, procedures, and possible side effects including pain during treatment (achy,cramping feeling), bruising, drop of blood, lightheadedness, nausea, sweating. Patient Consent Given: Yes Education handout provided: Yes Muscles treated:Right  piriformis, R gluteals and R Quadratus Lumborum  Needle size and number: .30x54m x 4 Electrical stimulation performed: No Parameters: N/A Treatment response/outcome: Twitch response elicited and Palpable decrease in muscle tension Post-treatment instructions: Patient instructed to expect possible mild to moderate muscle soreness later today and/or tomorrow. Patient instructed in methods to reduce muscle soreness and to continue prescribed HEP. If patient was dry needled over the lung field, patient was instructed on signs and symptoms of pneumothorax and, however unlikely, to see immediate medical attention should they occur. Patient was also educated on signs and symptoms of infection and to seek medical attention should they occur. Patient verbalized understanding of these instructions and education.   Modalities: Moist hot pack [facility] Adult PT Treatment:                                                DATE: 08-27-21 Therapeutic Exercise: -Supine Piriformis Stretch with Foot on Ground  3 reps  30 sec hold R side - Supine Figure 4 Piriformis Stretch  3 reps - 30 sec hold R side - Clamshell with Resistance  -3 sets - 10 reps - Supine Bridge with Mini Swiss  Ball Between Knees 3 sets - 10 reps - Supine Lower Trunk Rotation  5 reps - 20 sec  hold on R and then  L Manual Therapy: STW - to  Right QL and R buttocks/piriformis  Myofascial release of R QL and for piriformis Trigger Point Dry Needling Treatment: Pre-treatment instruction: Patient instructed on dry needling rationale, procedures, and possible side effects including pain during treatment (achy,cramping feeling), bruising, drop of blood, lightheadedness, nausea, sweating. Patient Consent Given: Yes Education handout provided: Yes Muscles treated: R piriformis, R gluteals and R Qaudratus Lumborum  Needle size and number: .30x64m x 5 Electrical stimulation performed: No Parameters: N/A Treatment response/outcome: Twitch response elicited and Palpable decrease in muscle tension Post-treatment instructions: Patient instructed to expect possible mild to moderate muscle soreness later today and/or tomorrow. Patient instructed in methods to reduce muscle soreness and to continue prescribed HEP. If patient was dry needled over the lung field, patient was instructed on signs and symptoms of pneumothorax and, however unlikely, to see immediate medical attention should they occur. Patient was also educated on signs and symptoms of infection and to seek medical attention should they occur. Patient verbalized understanding of these instructions and education.   Modalities: Moist hot pack  Issue initial HEP MET for R SI joint     PATIENT EDUCATION:  Education details: POC, explanation of LEFS, iniital HEP Explanation of findings education on TPDN precautions and aftercare, tennis ball for self STW. Advanced HEP including AMRAP example Person educated: Patient Education method: Explanation, Demonstration, Tactile cues, Verbal cues, and Handouts Education comprehension: verbalized understanding, returned demonstration, verbal cues required, tactile cues required, and needs further education     HOME EXERCISE PROGRAM:  updated 09-20-21 Access Code: [email or web address]URL:  [email or web address] Date: 09/20/2021 Prepared by: LVoncille Lo Program Notes sit in chair with hands under R thigh. Press/resist into hands for hamstring resistance to decrease pain in R Sacral Iliac area  Exercises - Supine Piriformis Stretch with Foot on Ground  - 1-2 x daily - 7 x weekly - 1 sets - 3 reps - 10-15 hold - Supine Figure 4 Piriformis Stretch  - 1-2 x daily - 7 x weekly - 1 sets - 3 reps - 10-15 hold - Clamshell with Resistance  - 1 x daily - 7 x weekly - 3 sets - 10 reps - Supine Bridge with Mini Swiss Ball Between Knees  - 1 x daily - 7 x weekly - 3 sets - 10 reps - Supine Lower Trunk Rotation  - 2 x daily - 7 x weekly - 1 sets - 5 reps - 20 sec  hold - Supine Piriformis Stretch Pulling Heel to Hip  - 1-2 x daily - 7 x weekly - 1 sets - 2-3 reps - 30 sec hold - Quadruped Rocking Slow  - 1 x daily - 7 x weekly - 3 sets - 10 reps - Standing Hip Hinge with Dowel  - 1 x daily - 7 x weekly - 3 sets - 10 reps - Goblet Squat with Kettlebell  - 1 x daily - 7 x weekly - 3 sets - 10 reps - Kettlebell Deadlift  - 1 x daily - 7 x weekly - 3 sets - Step up on 18 inch step  - 1 x daily - 7 x weekly - 2 sets - 10 reps - Reverse Lunge  - 1 x daily - 7 x weekly - 3 sets - 10 reps - Single Leg Bridge  - 1 x  daily - 7 x weekly - 3 sets - 10 reps - Supine Double Knee to Chest  - 1 x daily - 7 x weekly - 3 sets - 10 reps - Supine Piriformis Stretch Pulling Heel to Hip  - 1 x daily - 7 x weekly - 3 sets - 10 reps - Quadruped Rocking Slow  - 1 x daily - 7 x weekly - 3 sets - 10 reps - Quadruped Forearm Plank with a Mule Kick  - 1 x daily - 7 x weekly - 3 sets - 10 reps - Quadruped Hip Abduction with Resistance Loop  - 1 x daily - 7 x weekly - 3 sets - 10 reps - [place] Deadlift with Dumbbells just past Knees ONLY   - 1 x daily - 7 x weekly - 3 sets - 10 reps ASSESSMENT:   CLINICAL IMPRESSION:  Ms Picado enters  clinic with 3/10 pain in R SI. Palpation by PT with TTP over  R SI/proximal piriformis and obturator.  Original injury from a fall and pain level at 8/10.  Pt has difficulty sitting on bike seat and would like to return to bicycle activity with her children which she cannot do even now with improvement in symptoms.  Ms Geeting has been diligent to do HEP and shown good compliance with HEP  Pt has responded well to TPDN and manual but pain has only been reduced ot 3/10 and sometimes has been reduced to 1/10 in clinic post exercise but seems consistently at 3/10.  PT believes she needs specialized PT with pelvic floor therapy. MD has been contacted to make referral.  Pt has completed all LTG but continues to have residual pain of 3/10.  Pt states she has been gardening and irritating back  at times although she has been educated on proper body mechanics and lifting principles in clinic. Pt will be discharged with current HEP until she can be evaluated by pelvic floor PT         OBJECTIVE IMPAIRMENTS Abnormal gait, decreased activity tolerance, decreased mobility, decreased ROM, decreased strength, hypomobility, impaired flexibility, improper body mechanics, postural dysfunction, obesity, and pain.    ACTIVITY LIMITATIONS cleaning, driving, meal prep, yard work, and shopping.    PERSONAL FACTORS Fitness are also affecting patient's functional outcome.  Pt has not been able to exercise due to this SI/piriformis pain and would like to return to running eventually but would like to at least return to biking with children     REHAB POTENTIAL: Excellent   CLINICAL DECISION MAKING: Evolving/moderate complexity   EVALUATION COMPLEXITY: Moderate     GOALS: Goals reviewed with patient? Yes   SHORT TERM GOALS: Target date: 09/10/2021   Pt will be independent with initial HEP Baseline:given initial HEP Goal status: MET   2.  Pt will reduce pain during  standing to 5/10 from 8/10 and stand for 15 minutes Baseline: Pt with 8/10 pain if walking standing more than 10  minutes 08-30-21 5/10 for 15 to 20 minutes 09-06-21  4/10 to 3/10 Goal status: MET   3.  Pt will be able to press accelerator of car without exacerbating R SI pain Baseline: Pt with pain pressing accelerator of car 5/10 to 8/10 when R SI irritated 09-06-21 worked on strengthening to step down from large truck. 09-18-21 3/10 Goal status: Partially met   4.  Pt will begin home walking program daily for 15 minutes minimum Baseline: Pt not routinely exercising  now 08-30-21 starting now 10  min stop and then 10 min5-16-23  I am now walking  about 20-25 minutes and I dont have to stop Goal status: MET       LONG TERM GOALS: Target date: 10/01/2021   Pt will be independent with advanced HEP Baseline: no knowledge Goal status: MET   2.  Patient will report improved functional level on LEFS >/= 70/80 in order to allow for return to Baseline: LEFS 36/80 x 100 =45 % 09-18-21 09-18-21  57/80 71.3% Goal status: MET   3.  Pt will be able to ride bikes with children without exacerbating pain greater than 2/10  Baseline: Pt now has pain at rest 4/10 and up to 8/10 with activity  I have some pain still 5/10 because seat is tiny seat Goal status: ongoing   4.  Pt will be able to get into kneeling positions for gardening Baseline: Pt with pain up to 8/10 and avoids steps and kneeling, 09-17-21 3/10 when kneeling pain improving Goal status:  MET   5.  Pt will be consistent with home walking program at least 7000 steps 4 days a week Baseline: No routine exercise at this point 09-18-21 pt is now walking about 20-25 minutes a day Goal status: MET   6.  Pt will increase ER and hip flexion of R LE in order to more easily don clothing and kneel for gardening Baseline: Pt with pain with hip flex past 95 degrees and when kneeling Pt increased hip flexion to 102  Pain is 3/10 but never less Goal status:MET     PLAN: PT FREQUENCY: 2x/week   PT DURATION: 6 weeks   PLANNED INTERVENTIONS: Therapeutic exercises,  Therapeutic activity, Neuromuscular re-education, Gait training, Patient/Family education, Joint mobilization, Dry Needling, Electrical stimulation, Spinal mobilization, Cryotherapy, Moist heat, Taping, and Manual therapy.   PLAN FOR NEXT SESSION:   use bike and get and tolerate. Reinforce HEP     [employer] Authorization    Choose one: Rehabilitative   Standardized Assessment or Functional Outcome Tool: See Pain Assessment and LEFS   Score or Percent Disability: 36/80 x 100  = 45%     09-18-21  57/80 71.3%   Body Parts Treated (Select each separately):  Lumbopelvic. Overall deficits/functional limitations for body part selected: moderate Hip  R  Overall deficits/functional limitations for body part selected Moderate       If treatment provided at initial evaluation, no treatment charged due to lack of authorization.    Voncille Lo, PT, [place] Certified Exercise Expert for the Aging Adult  09/27/21 9:47 AM Phone: [phone] Fax: [phone]   Voncille Lo, [place], [place] Certified Exercise Expert for the Aging Adult  10/16/21 7:59 AM Phone: [phone] Fax: [phone]

## 2021-09-26 DIAGNOSIS — F331 Major depressive disorder, recurrent, moderate: Secondary | ICD-10-CM | POA: Diagnosis not present

## 2021-09-27 ENCOUNTER — Ambulatory Visit: Payer: Medicaid Other | Admitting: Physical Therapy

## 2021-09-27 ENCOUNTER — Other Ambulatory Visit: Payer: Self-pay | Admitting: Family Medicine

## 2021-09-27 ENCOUNTER — Encounter: Payer: Self-pay | Admitting: Physical Therapy

## 2021-09-27 DIAGNOSIS — G8929 Other chronic pain: Secondary | ICD-10-CM | POA: Diagnosis not present

## 2021-09-27 DIAGNOSIS — M5441 Lumbago with sciatica, right side: Secondary | ICD-10-CM | POA: Diagnosis not present

## 2021-09-27 DIAGNOSIS — M25651 Stiffness of right hip, not elsewhere classified: Secondary | ICD-10-CM | POA: Diagnosis not present

## 2021-09-27 DIAGNOSIS — M6281 Muscle weakness (generalized): Secondary | ICD-10-CM | POA: Diagnosis not present

## 2021-09-27 DIAGNOSIS — N9419 Other specified dyspareunia: Secondary | ICD-10-CM

## 2021-09-27 NOTE — Progress Notes (Signed)
Received message from patient's PT, Garen Lah, who recommended pelvic floor PT due to continued pain. Order placed.  Shirlean Mylar, MD [facility], PGY-3

## 2021-09-27 NOTE — Patient Instructions (Addendum)
This is an idea for you to incorporate the exercise you are given to have a cardio workout and to utilize a few exercises at a time.  I have given you a home program and you can use any of them in this format.    AMRAP ( As many reps as possible ) 15 minutes 4 rounds  Min 1 KB sit to stand goblet squat   with 30# Min 2 Latissimus pull with blue theraband 15 x on L and R Min 3 Forward lunge without weights and Upper extremity support Min 4  Step ups on  8 inch step    Voncille Lo, PT, [facility] Certified Exercise Expert for the Aging Adult  09/27/21 8:34 AM Phone: [phone] Fax: [phone]

## 2021-10-03 DIAGNOSIS — F331 Major depressive disorder, recurrent, moderate: Secondary | ICD-10-CM | POA: Diagnosis not present

## 2021-10-04 ENCOUNTER — Ambulatory Visit (INDEPENDENT_AMBULATORY_CARE_PROVIDER_SITE_OTHER): Payer: Medicaid Other | Admitting: Bariatrics

## 2021-10-04 ENCOUNTER — Encounter (INDEPENDENT_AMBULATORY_CARE_PROVIDER_SITE_OTHER): Payer: Self-pay | Admitting: Bariatrics

## 2021-10-04 VITALS — BP 105/72 | HR 67 | Temp 98.4°F | Ht 62.0 in | Wt 159.0 lb

## 2021-10-04 DIAGNOSIS — E559 Vitamin D deficiency, unspecified: Secondary | ICD-10-CM | POA: Diagnosis not present

## 2021-10-04 DIAGNOSIS — Z7984 Long term (current) use of oral hypoglycemic drugs: Secondary | ICD-10-CM | POA: Diagnosis not present

## 2021-10-04 DIAGNOSIS — E8881 Metabolic syndrome: Secondary | ICD-10-CM | POA: Diagnosis not present

## 2021-10-04 DIAGNOSIS — K5909 Other constipation: Secondary | ICD-10-CM

## 2021-10-04 DIAGNOSIS — Z6829 Body mass index (BMI) 29.0-29.9, adult: Secondary | ICD-10-CM

## 2021-10-04 DIAGNOSIS — E669 Obesity, unspecified: Secondary | ICD-10-CM | POA: Diagnosis not present

## 2021-10-04 DIAGNOSIS — Z419 Encounter for procedure for purposes other than remedying health state, unspecified: Secondary | ICD-10-CM | POA: Diagnosis not present

## 2021-10-04 DIAGNOSIS — E88819 Insulin resistance, unspecified: Secondary | ICD-10-CM

## 2021-10-04 DIAGNOSIS — E6609 Other obesity due to excess calories: Secondary | ICD-10-CM

## 2021-10-04 DIAGNOSIS — E66811 Obesity, class 1: Secondary | ICD-10-CM

## 2021-10-04 MED ORDER — VITAMIN D (ERGOCALCIFEROL) 1.25 MG (50000 UNIT) PO CAPS: 50000.0000 [IU] | ORAL_CAPSULE | ORAL | 0 refills | Status: DC

## 2021-10-04 MED ORDER — METFORMIN HCL 500 MG PO TABS: 500.0000 mg | ORAL_TABLET | Freq: Every day | ORAL | 0 refills | Status: DC

## 2021-10-08 NOTE — Progress Notes (Signed)
Chief Complaint:   OBESITY Carol Welch is here to discuss her progress with her obesity treatment plan along with follow-up of her obesity related diagnoses. Carol Welch is on keeping a food journal and adhering to recommended goals of 1000-1100 calories and 75 grams of protein and states she is following her eating plan approximately 100% of the time. Carol Welch states she is walking and running for 30 minutes 5-6 times per week.  Today's visit was #: 5 Starting weight: 167 lbs Starting date: 08/08/2021 Today's weight: 159 lbs Today's date: 10/04/2021 Total lbs lost to date: 8 lbs Total lbs lost since last in-office visit: 6 lbs  Interim History: Carol Welch is down another 56 lbs since her last visit. She is using 97 app to help with her with her calories and protein.   Subjective:   1. Insulin resistance Carol Welch is taking Metformin currently.   2. Vitamin D insufficiency Carol Welch is taking as directed. She get limited sun exposure.   3. Other constipation Carol Welch is taking Senokot currently.   Assessment/Plan:   1. Insulin resistance We will refill Metformin 500 mg for 1 month with no refills. Carol Welch will continue to work on weight loss, exercise, and decreasing simple carbohydrates to help decrease the risk of diabetes. Carol Welch agreed to follow-up with [place] as directed to closely monitor her progress.  - metFORMIN (GLUCOPHAGE) 500 MG tablet; Take 1 tablet (500 mg total) by mouth daily with breakfast.  Dispense: 30 tablet; Refill: 0  2. Vitamin D insufficiency Low Vitamin D level contributes to fatigue and are associated with obesity, breast, and colon cancer. We will refill prescription Vitamin D 50,000 IU every week for 1 month with no refills and Carol Welch will follow-up for routine testing of Vitamin D, at least 2-3 times per year to avoid over-replacement.  - Vitamin D, Ergocalciferol, (DRISDOL) 1.25 MG (50000 UNIT) CAPS capsule; Take 1 capsule (50,000 Units total) by mouth every 7  (seven) days.  Dispense: 5 capsule; Refill: 0  3. Other constipation Carol Welch was informed that a decrease in bowel movement frequency is normal while losing weight, but stools should not be hard or painful. Carol Welch  will continue Senokot. Orders and follow up as documented in patient record.   Counseling Getting to Good Bowel Health: Your goal is to have one soft bowel movement each day. Drink at least 8 glasses of water each day. Eat plenty of fiber (goal is over 25 grams each day). It is best to get most of your fiber from dietary sources which includes leafy green vegetables, fresh fruit, and whole grains. You may need to add fiber with the help of OTC fiber supplements. These include Metamucil, Citrucel, and Flaxseed. If you are still having trouble, try adding Miralax or Magnesium Citrate. If all of these changes do not work, Dietitian.   4. Obesity, Current BMI 29.2 Carol Welch is currently in the action stage of change. As such, her goal is to continue with weight loss efforts. She has agreed to keeping a food journal and adhering to recommended goals of 1000-1100 calories and 75 grams of protein.   Carol Welch will continue meal planning and she will continue intentional eating.She will increase water intake. Eating Out Sheet was provided today.   Exercise goals:  As is.   Behavioral modification strategies: increasing lean protein intake, decreasing simple carbohydrates, increasing vegetables, increasing water intake, decreasing eating out, no skipping meals, meal planning and cooking strategies, keeping healthy foods in the home, and planning for  success.  Carol Welch has agreed to follow-up with our clinic in 2-3 weeks. She was informed of the importance of frequent follow-up visits to maximize her success with intensive lifestyle modifications for her multiple health conditions.   Objective:   Blood pressure 105/72, pulse 67, temperature 98.4 F (36.9 C), height 5\' 2"  (1.575 m),  weight 159 lb (72.1 kg), last menstrual period 12/19/2019, SpO2 (!) 66 %. Body mass index is 29.08 kg/m.  General: Cooperative, alert, well developed, in no acute distress. HEENT: Conjunctivae and lids unremarkable. Cardiovascular: Regular rhythm.  Lungs: Normal work of breathing. Neurologic: No focal deficits.   Lab Results  Component Value Date   CREATININE 0.62 03/13/2021   BUN 14 03/13/2021   NA 136 03/13/2021   K 4.2 03/13/2021   CL 104 03/13/2021   CO2 24 03/13/2021   Lab Results  Component Value Date   ALT 10 05/29/2021   AST 15 05/29/2021   ALKPHOS 48 05/29/2021   BILITOT 0.2 05/29/2021   Lab Results  Component Value Date   HGBA1C 5.6 08/08/2021   Lab Results  Component Value Date   INSULIN 5.7 08/08/2021   Lab Results  Component Value Date   TSH 1.35 03/20/2021   No results found for: CHOL, HDL, LDLCALC, LDLDIRECT, TRIG, CHOLHDL Lab Results  Component Value Date   VD25OH 37.3 08/08/2021   VD25OH 41.65 12/05/2020   Lab Results  Component Value Date   WBC 6.0 05/29/2021   HGB 13.3 05/29/2021   HCT 38.9 05/29/2021   MCV 88 05/29/2021   PLT 250 05/29/2021   No results found for: IRON, TIBC, FERRITIN  Attestation Statements:   Reviewed by clinician on day of visit: allergies, medications, problem list, medical history, surgical history, family history, social history, and previous encounter notes.  I, 05/31/2021, RMA, am acting as [employer] for [employer] manager, DO.  I have reviewed the above documentation for accuracy and completeness, and I agree with the above. [employer], DO

## 2021-10-09 ENCOUNTER — Encounter: Payer: Self-pay | Admitting: *Deleted

## 2021-10-10 DIAGNOSIS — F331 Major depressive disorder, recurrent, moderate: Secondary | ICD-10-CM | POA: Diagnosis not present

## 2021-10-11 DIAGNOSIS — L739 Follicular disorder, unspecified: Secondary | ICD-10-CM | POA: Diagnosis not present

## 2021-10-11 DIAGNOSIS — N898 Other specified noninflammatory disorders of vagina: Secondary | ICD-10-CM | POA: Diagnosis not present

## 2021-10-11 DIAGNOSIS — R300 Dysuria: Secondary | ICD-10-CM | POA: Diagnosis not present

## 2021-10-14 ENCOUNTER — Encounter (INDEPENDENT_AMBULATORY_CARE_PROVIDER_SITE_OTHER): Payer: Self-pay | Admitting: Bariatrics

## 2021-10-17 DIAGNOSIS — F331 Major depressive disorder, recurrent, moderate: Secondary | ICD-10-CM | POA: Diagnosis not present

## 2021-10-17 NOTE — Progress Notes (Signed)
TeleHealth Visit:  This visit was completed with telemedicine (audio/video) technology. Carol Welch has verbally consented to this TeleHealth visit. The patient is located at home, the provider is located at home. The participants in this visit include the listed provider and patient. The visit was conducted today via MyChart video.  OBESITY Carol Welch is here to discuss her progress with her obesity treatment plan along with follow-up of her obesity related diagnoses.  ` Today's visit was # 6 Starting weight: 167 lbs Starting date: 08/08/2021 Weight at last in office visit: 159 lbs on 10/04/21 Total weight loss: 8 lbs at last in office visit on 10/04/21. Today's reported weight: 159 lbs   Nutrition Plan: keeping a food journal and adhering to recommended goals of 1000-1100 calories and 75 gms protein.  Hunger is moderately controlled. Cravings are moderately controlled.  Current exercise: none  Interim History: Her mother-in-law has been in the hospital after breaking her leg and her hip and Carol Welch has been staying at the hospital with her.  Her mother-in-law got out of the hospital a few days ago and Carol Welch wants to get back to her exercise routine-combo of walking and running 5 to 6 days/week. She is journaling daily and meeting calorie goals.  However she is low on protein some days.  This is because she sometimes skips protein at 1 meal. Weight goal is 145 pounds-26 BMI.  Assessment/Plan:  1. Vitamin D Deficiency Vitamin D is not at goal of 50-it is below goal at 37. She is on weekly prescription Vitamin D 50,000 IU.  Lab Results  Component Value Date   VD25OH 37.3 08/08/2021   VD25OH 41.65 12/05/2020    Plan: Refill prescription vitamin D 50,000 IU weekly.  2. Insulin Resistance She has had very mildly elevated insulin readings.  A1c 5.6. Shedenies polyphagia. Medication(s): Has been taking 250 mg of metformin in the morning with breakfast.  Tolerating well. Lab Results   Component Value Date   HGBA1C 5.6 08/08/2021   Lab Results  Component Value Date   INSULIN 5.7 08/08/2021    Plan Increase dose of metformin to 500 mg once daily.   3. Obesity: Current BMI 29.2 Kathia is currently in the action stage of change. As such, her goal is to continue with weight loss efforts.  She has agreed to keeping a food journal and adhering to recommended goals of 1000-1100 calories and 75 protein.   Exercise goals: She will get back to her normal routine of running/walking 5 to 6 days/week.  We discussed adding in strength training for her arms with hand weights.  Behavioral modification strategies: increasing lean protein intake.  Lashika has agreed to follow-up with our clinic in 2 weeks.   No orders of the defined types were placed in this encounter.   Medications Discontinued During This Encounter  Medication Reason   Vitamin D, Ergocalciferol, (DRISDOL) 1.25 MG (50000 UNIT) CAPS capsule Reorder     Meds ordered this encounter  Medications   Vitamin D, Ergocalciferol, (DRISDOL) 1.25 MG (50000 UNIT) CAPS capsule    Sig: Take 1 capsule (50,000 Units total) by mouth every 7 (seven) days.    Dispense:  5 capsule    Refill:  0    Order Specific Question:   Supervising Provider    Answer:   Quillian Quince D [[id number]]      Objective:   VITALS: Per patient if applicable, see vitals. GENERAL: Alert and in no acute distress. CARDIOPULMONARY: No increased WOB. Speaking in clear sentences.  PSYCH: Pleasant and cooperative. Speech normal rate and rhythm. Affect is appropriate. Insight and judgement are appropriate. Attention is focused, linear, and appropriate.  NEURO: Oriented as arrived to appointment on time with no prompting.   Lab Results  Component Value Date   CREATININE 0.62 03/13/2021   BUN 14 03/13/2021   NA 136 03/13/2021   K 4.2 03/13/2021   CL 104 03/13/2021   CO2 24 03/13/2021   Lab Results  Component Value Date   ALT 10 05/29/2021    AST 15 05/29/2021   ALKPHOS 48 05/29/2021   BILITOT 0.2 05/29/2021   Lab Results  Component Value Date   HGBA1C 5.6 08/08/2021   Lab Results  Component Value Date   INSULIN 5.7 08/08/2021   Lab Results  Component Value Date   TSH 1.35 03/20/2021   No results found for: "CHOL", "HDL", "LDLCALC", "LDLDIRECT", "TRIG", "CHOLHDL" Lab Results  Component Value Date   WBC 6.0 05/29/2021   HGB 13.3 05/29/2021   HCT 38.9 05/29/2021   MCV 88 05/29/2021   PLT 250 05/29/2021   No results found for: "IRON", "TIBC", "FERRITIN" Lab Results  Component Value Date   VD25OH 37.3 08/08/2021   VD25OH 41.65 12/05/2020    Attestation Statements:   Reviewed by clinician on day of visit: allergies, medications, problem list, medical history, surgical history, family history, social history, and previous encounter notes.

## 2021-10-18 ENCOUNTER — Ambulatory Visit: Payer: Medicaid Other | Attending: Family Medicine | Admitting: Physical Therapy

## 2021-10-18 DIAGNOSIS — N9419 Other specified dyspareunia: Secondary | ICD-10-CM | POA: Diagnosis not present

## 2021-10-18 DIAGNOSIS — M6281 Muscle weakness (generalized): Secondary | ICD-10-CM | POA: Insufficient documentation

## 2021-10-18 DIAGNOSIS — R293 Abnormal posture: Secondary | ICD-10-CM | POA: Insufficient documentation

## 2021-10-18 DIAGNOSIS — M62838 Other muscle spasm: Secondary | ICD-10-CM | POA: Insufficient documentation

## 2021-10-18 DIAGNOSIS — R279 Unspecified lack of coordination: Secondary | ICD-10-CM | POA: Insufficient documentation

## 2021-10-18 NOTE — Therapy (Signed)
OUTPATIENT PHYSICAL THERAPY FEMALE PELVIC EVALUATION   Patient Name: Carol Welch MRN: [id number] DOB:1976-03-11, 46 y.o., female Today's Date: 10/18/2021   PT End of Session - 10/18/21 0809     Visit Number 1    Date for PT Re-Evaluation 01/18/22    Authorization Type [employer]    PT Start Time 0803    PT Stop Time 0842    PT Time Calculation (min) 39 min    Activity Tolerance Patient tolerated treatment well    Behavior During Therapy [facility] for tasks assessed/performed             Past Medical History:  Diagnosis Date   Anal fissure    Colitis    Depression    Edema, lower extremity    Fatty liver    Hypothyroidism    Low blood pressure    PONV (postoperative nausea and vomiting)    SVD (spontaneous vaginal delivery) 06/30/2015   Vitamin D deficiency    Past Surgical History:  Procedure Laterality Date   BREAST CYST ASPIRATION Right 02/13/2018   CYSTOSCOPY N/A 12/30/2019   Procedure: CYSTOSCOPY;  Surgeon: Sherian Rein, MD;  Location: [facility];  Service: Gynecology;  Laterality: N/A;   ENDOVENOUS ABLATION SAPHENOUS VEIN W/ LASER Right 10/08/2018   endovenous laser ablation right saphenous vein by Waverly Ferrari MD   LAPAROSCOPIC VAGINAL HYSTERECTOMY WITH SALPINGECTOMY Bilateral 12/30/2019   Procedure: LAPAROSCOPIC ASSISTED VAGINAL HYSTERECTOMY WITH SALPINGECTOMY;  Surgeon: Sherian Rein, MD;  Location: [facility];  Service: Gynecology;  Laterality: Bilateral;   Patient Active Problem List   Diagnosis Date Noted   Class 1 obesity due to excess calories with body mass index (BMI) of 30.0 to 30.9 in adult 08/13/2021   Trapezius muscle strain, right, initial encounter 03/20/2021   Gastroenteritis 10/03/2020   Hand trauma, right, initial encounter 07/18/2020   Encounter for surveillance of abnormal nevi 07/09/2020   Acquired hypothyroidism 07/07/2020   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   Neck  pain 01/18/2020   S/P laparoscopic assisted vaginal hysterectomy (LAVH) 12/30/2019   External thrombosed hemorrhoids 09/11/2019   MDD (major depressive disorder) 08/24/2019   Orthostatic hypotension 08/24/2019   Hypothyroidism 07/06/2018   Family history of congenital anomaly 08/27/2016    PCP: Shirlean Mylar, MD  REFERRING PROVIDER: McDiarmid, Leighton Roach, MD  REFERRING DIAG:  [phone] (ICD-10-CM) - Dyspareunia due to medical condition in female  THERAPY DIAG:  Muscle weakness (generalized) - Plan: PT plan of care cert/re-cert  Unspecified lack of coordination - Plan: PT plan of care cert/re-cert  Other muscle spasm - Plan: PT plan of care cert/re-cert  Abnormal posture - Plan: PT plan of care cert/re-cert  Rationale for Evaluation and Treatment Rehabilitation  ONSET DATE: over a year ago  SUBJECTIVE:  SUBJECTIVE STATEMENT: Pt reports she is having pain in low back and hips. Pt was in PT for this and has improved with this but pain is not gone. Pt had hysterectomy over a year ago and has had urinary leakage with pressure felt, fell and hit tailbone and has had pain with intercourse since this.   Fluid intake: Yes: 6-7 glasses of water, coffee once per day     PAIN:  Are you having pain? Yes NPRS scale: 5-6/10 on average; worse with gardening; can't ride bikes anymore Pain location:  tailbone area and Lt mid glute  Pain type: pinching Pain description: constant with some kind of pain  Aggravating factors: prolonged sitting, biking, prolonged standing, gardening Relieving factors: massage, meds, hot or cold water  PRECAUTIONS: None  WEIGHT BEARING RESTRICTIONS No  FALLS:  Has patient fallen in last 6 months? No  LIVING ENVIRONMENT: Lives with: lives with their family Lives in:  House/apartment   OCCUPATION: not currently working  PLOF: Independent  PATIENT GOALS to have less pain and less leakage  PERTINENT HISTORY:   Colitis, depression, hypothyroidism, vitamin D deficiency, fatty live Sexual abuse: No  BOWEL MOVEMENT Pain with bowel movement: No Type of bowel movement:Type (Bristol Stool Scale) 2-4, Frequency 1-2x per day, and Strain Yes Fully empty rectum: No Leakage: No Pads: No Fiber supplement: Yes: stool softener daily   URINATION Pain with urination: No Fully empty bladder: No Stream: Strong Urgency: Yes:   Frequency: every 2 hours Leakage:  randomly  Pads: No  INTERCOURSE Pain with intercourse: Initial Penetration and During Penetration Ability to have vaginal penetration:  Yes:   Climax: sometimes is able other limited with pain Marinoff Scale: 2/3  PREGNANCY Vaginal deliveries 5 Tearing Yes: did have tearing with the first few and needed stitches, did not have stitches on fourth but had tearing C-section deliveries 0 Currently pregnant No  PROLAPSE None    OBJECTIVE:   DIAGNOSTIC FINDINGS:   PATIENT SURVEYS:    [id number]  COGNITION:  Overall cognitive status: Within functional limits for tasks assessed     SENSATION:  Light touch: Deficits pt reports intermittent Rt leg tingling from hip to foot  Proprioception: Appears intact  MUSCLE LENGTH: Bil hamstrings and adductors limited by 25%  LUMBAR SPECIAL TESTS:  SI Compression/distraction test: Positive, FABER test: Positive, and Gaenslen's test: Positive all (+) at Rt for pain at Rt SIJ and glute per pt                POSTURE: rounded shoulders and posterior pelvic tilt   PELVIC ALIGNMENT:  LUMBARAROM/PROM  A/PROM A/PROM  eval  Flexion   Extension   Right lateral flexion   Left lateral flexion   Right rotation   Left rotation    (Blank rows = not tested)  LOWER EXTREMITY ROM:  Bil WFL  LOWER EXTREMITY MMT:  All assessed in sidelying Rt hip  abduction 3+/5, extension and adduction 4/5, flexion 4+/5; Lt hip grossly 4/5    PALPATION:   General  TTP at Rt iliopsoas, fascial restrictions throughout abdomen and mild TTP over bladder at suprapubic region, TTP at Rt thoracic and lumbar paraspinals, Rt SIJ, Rt piriformis, coccyx at midline and at tip with muscle tension noted at Rt side                External Perineal Exam TTP at Rt bulbocavernosus, dryness noted at vulva  Internal Pelvic Floor TTP at bil bulbocavernosus, Rt pubococcygeus, bil obturator internus and iliococcygeus with trigger points throughout pelvic floor at both deep and superficial layers.   Patient confirms identification and approves PT to assess internal pelvic floor and treatment Yes  PELVIC MMT:   MMT eval  Vaginal 4/5; 9s isometric; 7 reps  Internal Anal Sphincter   External Anal Sphincter   Puborectalis   Diastasis Recti   (Blank rows = not tested)        TONE: Increased throughout   PROLAPSE: Possible grade 1 anterior wall laxity in hooklying with cough   TODAY'S TREATMENT  10/18/2021 EVAL Examination completed, findings reviewed, pt educated on POC, HEP. Pt motivated to participate in PT and agreeable to attempt recommendations.     PATIENT EDUCATION:  Education details: [id number] Person educated: Patient Education method: Programmer, multimedia, Demonstration, Tactile cues, Verbal cues, and Handouts Education comprehension: verbalized understanding and returned demonstration   HOME EXERCISE PROGRAM: [id number]  ASSESSMENT:  CLINICAL IMPRESSION: Patient is a 47 y.o. female  who was seen today for physical therapy evaluation and treatment for pelvic pain, urinary incontinence, persistent pain status post having PT for Rt SI and piriformis pain. Pt reported during evaluation she currently has urinary leakage at random times with a pressure felt then small loss of urine, back and lower abdominal pain with lifting heavy items,  reports she struggles with constipation and takes a stool softener daily and frequently needs to strain to empty, doesn't feel fully evacuated with bowels or bladder post voids, and has pain and dryness with intercourse and coccyx pain after a fall hitting tailbone a year ago as well which limits standing and sitting. Pt has history of hysterectomy over a year ago. Pt found to have decreased flexibility in spine and bil hips, decreased strength in bil hips and core, (+) lumbar tests mentioned above, TTP at abdomen, spine and pelvic floor mentioned above. Pt consented to internal vaginal assessment this date and found to have increased tension throughout pelvic floor in all layers and TTP throughout with trigger points as well. Pt would benefit from additional PT to further address deficits.     OBJECTIVE IMPAIRMENTS decreased activity tolerance, decreased coordination, decreased endurance, decreased mobility, decreased strength, increased fascial restrictions, increased muscle spasms, impaired flexibility, impaired tone, improper body mechanics, postural dysfunction, and pain.   ACTIVITY LIMITATIONS carrying, lifting, sitting, standing, squatting, continence, and intercourse  PARTICIPATION LIMITATIONS:  cleaning, laundry, interpersonal relationship, driving, shopping, community activity, and yard work PERSONAL FACTORS Fitness, Past/current experiences, Time since onset of injury/illness/exacerbation, and 1 comorbidity: x5 vaginal births with tearing, multiple symptoms  are also affecting patient's functional outcome.   REHAB POTENTIAL: Good  CLINICAL DECISION MAKING: Evolving/moderate complexity  EVALUATION COMPLEXITY: Moderate   GOALS: Goals reviewed with patient? Yes  SHORT TERM GOALS: Target date: 11/15/2021  Pt to be I with HEP.  Baseline: Goal status: INITIAL  2.  Pt will have 25% less urgency due to bladder retraining and proper voiding mechanics and pelvic floor relaxation  techniques.   Baseline:  Goal status: INITIAL  3. Pt will report her BMs are complete due to improved bowel habits and evacuation techniques.  Baseline:  Goal status: INITIAL   LONG TERM GOALS: Target date: 01/18/2022   Pt to be I with advanced HEP.  Baseline:  Goal status: INITIAL  2.  Pt to demonstrate at least 5/5 bil hip strength for improved pelvic stability and functional squats without leakage.  Baseline:  Goal status: INITIAL  3.  Pt will have 50% less urgency due to bladder retraining and proper voiding mechanics and pelvic floor relaxation techniques.  Baseline:  Goal status: INITIAL  4.  Pt to demonstrate improved coordination of pelvic floor and breathing mechanics to allow full range of movement of pelvic floor with contraction/ relax/ bulge due to decreased tension and pain at pelvic floor.  Baseline:  Goal status: INITIAL  5.  Pt to report no more than urinary leakage instance per week due to improved symptoms.  Baseline:  Goal status: INITIAL  6.  Pt to demonstrated at least 4/5 strength of pelvic floor without pain and full relaxation without pain at pelvic floor to deceased tension and improved tolerance to vaginal penetration.  Baseline:  Goal status: INITIAL  PLAN: PT FREQUENCY: 1x/week  PT DURATION:  10 sessions  PLANNED INTERVENTIONS: Therapeutic exercises, Therapeutic activity, Neuromuscular re-education, Patient/Family education, Joint mobilization, Aquatic Therapy, Dry Needling, Spinal mobilization, Cryotherapy, Moist heat, Manual lymph drainage, scar mobilization, Taping, Biofeedback, and Manual therapy  PLAN FOR NEXT SESSION: relaxation techniques, moisturizers/lubricants handouts, manual at spine/hips/abdomen, voiding mechanics, possible internal if pt consents and this is appropriate   Otelia Sergeant, PT, DPT 06/15/239:00 AM

## 2021-10-22 ENCOUNTER — Encounter (INDEPENDENT_AMBULATORY_CARE_PROVIDER_SITE_OTHER): Payer: Self-pay | Admitting: Family Medicine

## 2021-10-22 ENCOUNTER — Telehealth (INDEPENDENT_AMBULATORY_CARE_PROVIDER_SITE_OTHER): Payer: Medicaid Other | Admitting: Family Medicine

## 2021-10-22 DIAGNOSIS — E669 Obesity, unspecified: Secondary | ICD-10-CM | POA: Diagnosis not present

## 2021-10-22 DIAGNOSIS — Z6829 Body mass index (BMI) 29.0-29.9, adult: Secondary | ICD-10-CM

## 2021-10-22 DIAGNOSIS — E559 Vitamin D deficiency, unspecified: Secondary | ICD-10-CM

## 2021-10-22 DIAGNOSIS — E6609 Other obesity due to excess calories: Secondary | ICD-10-CM

## 2021-10-22 DIAGNOSIS — E8881 Metabolic syndrome: Secondary | ICD-10-CM

## 2021-10-22 DIAGNOSIS — Z7984 Long term (current) use of oral hypoglycemic drugs: Secondary | ICD-10-CM

## 2021-10-22 DIAGNOSIS — E88819 Insulin resistance, unspecified: Secondary | ICD-10-CM

## 2021-10-22 MED ORDER — VITAMIN D (ERGOCALCIFEROL) 1.25 MG (50000 UNIT) PO CAPS: 50000.0000 [IU] | ORAL_CAPSULE | ORAL | 0 refills | Status: DC

## 2021-10-25 ENCOUNTER — Ambulatory Visit: Payer: Medicaid Other | Admitting: Physical Therapy

## 2021-10-25 DIAGNOSIS — M6281 Muscle weakness (generalized): Secondary | ICD-10-CM

## 2021-10-25 DIAGNOSIS — R279 Unspecified lack of coordination: Secondary | ICD-10-CM | POA: Diagnosis not present

## 2021-10-25 DIAGNOSIS — M62838 Other muscle spasm: Secondary | ICD-10-CM

## 2021-10-25 DIAGNOSIS — N9419 Other specified dyspareunia: Secondary | ICD-10-CM | POA: Diagnosis not present

## 2021-10-25 DIAGNOSIS — R293 Abnormal posture: Secondary | ICD-10-CM | POA: Diagnosis not present

## 2021-10-25 NOTE — Patient Instructions (Signed)
Moisturizers They are used in the vagina to hydrate the mucous membrane that make up the vaginal canal. Designed to keep a more normal acid balance (ph) Once placed in the vagina, it will last between two to three days.  Use 2-3 times per week at bedtime  Ingredients to avoid is glycerin and fragrance, can increase chance of infection Should not be used just before sex due to causing irritation Most are gels administered either in a tampon-shaped applicator or as a vaginal suppository. They are non-hormonal.   Types of Moisturizers(internal use)  Vitamin E vaginal suppositories- Whole foods, Amazon Moist Again Coconut oil- can break down condoms Julva- (Do no use if on Tamoxifen) amazon Yes moisturizer- amazon NeuEve Silk , NeuEve Silver for menopausal or over 65 (if have severe vaginal atrophy or cancer treatments use NeuEve Silk for  1 month than move to [employer])- [employer], [place].com Olive and Bee intimate cream- [email or web address].com.au Mae vaginal moisturizer- Amazon Aloe    Creams to use externally on the Vulva area Marathon Oil (good for for cancer patients that had radiation to the area)- [place] or [employer].[email or web address] V-magic cream - amazon Julva-amazon Vital "V Wild Yam salve ( help moisturize and help with thinning vulvar area, does have Beeswax MoodMaid Botanical Pro-Meno Wild Yam Cream- Amazon Desert Harvest Gele Cleo by Zane Herald labial moisturizer (Amazon,  Coconut or olive oil aloe   Things to avoid in the vaginal area Do not use things to irritate the vulvar area No lotions just specialized creams for the vulva area- Neogyn, V-magic, No soaps; can use Aveeno or Calendula cleanser if needed. Must be gentle No deodorants No douches Good to sleep without underwear to let the vaginal area to air out No scrubbing: spread the lips to let warm water rinse over labias and pat dry  Pelvic Floor Jolayne Panther  TMT rectal                                                                         Serenity  TMT vaginal                                                     Intimate Rose Pelvic wand with vibration                       Intimate Rose pelvic wand Hot/cold temperature  Intimate Rose Pelvic Wand                                                                  Original Therawand  Deluxe Therawand  These wands can be ordered from [employer].com, [email or web address], [email or web address]

## 2021-10-25 NOTE — Therapy (Signed)
OUTPATIENT PHYSICAL THERAPY FEMALE PELVIC TREATMENT   Patient Name: Carol Welch MRN: [id number] DOB:1976-03-26, 46 y.o., female Today's Date: 10/25/2021   PT End of Session - 10/25/21 1144     Visit Number 2    Number of Visits 10   had 8 from PT previous   Date for PT Re-Evaluation 01/18/22    Authorization Type [employer]    Authorization - Visit Number --    Authorization - Number of Visits 10   27 total (used 8 at other PT)   PT Start Time 1100    PT Stop Time 1144    PT Time Calculation (min) 44 min    Activity Tolerance Patient tolerated treatment well    Behavior During Therapy WFL for tasks assessed/performed              Past Medical History:  Diagnosis Date   Anal fissure    Colitis    Depression    Edema, lower extremity    Fatty liver    Hypothyroidism    Low blood pressure    PONV (postoperative nausea and vomiting)    SVD (spontaneous vaginal delivery) 06/30/2015   Vitamin D deficiency    Past Surgical History:  Procedure Laterality Date   BREAST CYST ASPIRATION Right 02/13/2018   CYSTOSCOPY N/A 12/30/2019   Procedure: CYSTOSCOPY;  Surgeon: Sherian Rein, MD;  Location: [facility];  Service: Gynecology;  Laterality: N/A;   ENDOVENOUS ABLATION SAPHENOUS VEIN W/ LASER Right 10/08/2018   endovenous laser ablation right saphenous vein by Waverly Ferrari MD   LAPAROSCOPIC VAGINAL HYSTERECTOMY WITH SALPINGECTOMY Bilateral 12/30/2019   Procedure: LAPAROSCOPIC ASSISTED VAGINAL HYSTERECTOMY WITH SALPINGECTOMY;  Surgeon: Sherian Rein, MD;  Location: [facility];  Service: Gynecology;  Laterality: Bilateral;   Patient Active Problem List   Diagnosis Date Noted   Class 1 obesity due to excess calories with body mass index (BMI) of 30.0 to 30.9 in adult 08/13/2021   Trapezius muscle strain, right, initial encounter 03/20/2021   Gastroenteritis 10/03/2020   Hand trauma, right, initial encounter 07/18/2020    Encounter for surveillance of abnormal nevi 07/09/2020   Acquired hypothyroidism 07/07/2020   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   Neck pain 01/18/2020   S/P laparoscopic assisted vaginal hysterectomy (LAVH) 12/30/2019   External thrombosed hemorrhoids 09/11/2019   MDD (major depressive disorder) 08/24/2019   Orthostatic hypotension 08/24/2019   Hypothyroidism 07/06/2018   Family history of congenital anomaly 08/27/2016    PCP: Shirlean Mylar, MD  REFERRING PROVIDER: McDiarmid, Leighton Roach, MD  REFERRING DIAG:  [phone] (ICD-10-CM) - Dyspareunia due to medical condition in female  THERAPY DIAG:  Muscle weakness (generalized)  Unspecified lack of coordination  Other muscle spasm  Rationale for Evaluation and Treatment Rehabilitation  ONSET DATE: over a year ago  SUBJECTIVE:  SUBJECTIVE STATEMENT: Pt reports leakage has been a little better, one instance of leakage with cooking and picking up a heavy pot. Pt did have some soreness/cramping after internal last session however reported relief from pain after this and less pain with intercourse. Pain has returned since this relief.  Fluid intake: Yes: 6-7 glasses of water, coffee once per day     PAIN:  Are you having pain? Yes NPRS scale: 4/10  Pain location:  tailbone area and Rt mid glute  Pain type: pinching Pain description: constant with some kind of pain  Aggravating factors: prolonged sitting, biking, prolonged standing, gardening Relieving factors: massage, meds, hot or cold water  PRECAUTIONS: None  WEIGHT BEARING RESTRICTIONS No  FALLS:  Has patient fallen in last 6 months? No  LIVING ENVIRONMENT: Lives with: lives with their family Lives in: House/apartment   OCCUPATION: not currently working  PLOF:  Independent  PATIENT GOALS to have less pain and less leakage  PERTINENT HISTORY:   Colitis, depression, hypothyroidism, vitamin D deficiency, fatty live Sexual abuse: No  BOWEL MOVEMENT Pain with bowel movement: No Type of bowel movement:Type (Bristol Stool Scale) 2-4, Frequency 1-2x per day, and Strain Yes Fully empty rectum: No Leakage: No Pads: No Fiber supplement: Yes: stool softener daily   URINATION Pain with urination: No Fully empty bladder: No Stream: Strong Urgency: Yes:   Frequency: every 2 hours Leakage:  randomly  Pads: No  INTERCOURSE Pain with intercourse: Initial Penetration and During Penetration Ability to have vaginal penetration:  Yes:   Climax: sometimes is able other limited with pain Marinoff Scale: 2/3  PREGNANCY Vaginal deliveries 5 Tearing Yes: did have tearing with the first few and needed stitches, did not have stitches on fourth but had tearing C-section deliveries 0 Currently pregnant No  PROLAPSE None    OBJECTIVE:   DIAGNOSTIC FINDINGS:   PATIENT SURVEYS:    PFIQ-7 62  COGNITION:  Overall cognitive status: Within functional limits for tasks assessed     SENSATION:  Light touch: Deficits pt reports intermittent Rt leg tingling from hip to foot  Proprioception: Appears intact  MUSCLE LENGTH: Bil hamstrings and adductors limited by 25%  LUMBAR SPECIAL TESTS:  SI Compression/distraction test: Positive, FABER test: Positive, and Gaenslen's test: Positive all (+) at Rt for pain at Rt SIJ and glute per pt                POSTURE: rounded shoulders and posterior pelvic tilt  LOWER EXTREMITY ROM:  Bil WFL  LOWER EXTREMITY MMT:  All assessed in sidelying Rt hip abduction 3+/5, extension and adduction 4/5, flexion 4+/5; Lt hip grossly 4/5    PALPATION:   General  TTP at Rt iliopsoas, fascial restrictions throughout abdomen and mild TTP over bladder at suprapubic region, TTP at Rt thoracic and lumbar paraspinals, Rt SIJ,  Rt piriformis, coccyx at midline and at tip with muscle tension noted at Rt side                External Perineal Exam TTP at Rt bulbocavernosus, dryness noted at vulva                              Internal Pelvic Floor TTP at bil bulbocavernosus, Rt pubococcygeus, bil obturator internus and iliococcygeus with trigger points throughout pelvic floor at both deep and superficial layers.   Patient confirms identification and approves PT to assess internal pelvic floor and treatment Yes  PELVIC MMT:   MMT eval  Vaginal 4/5; 9s isometric; 7 reps  Internal Anal Sphincter   External Anal Sphincter   Puborectalis   Diastasis Recti   (Blank rows = not tested)        TONE: Increased throughout   PROLAPSE: Possible grade 1 anterior wall laxity in hooklying with cough   TODAY'S TREATMENT  10/25/21: Manual work: pt consented to internal vaginal treatment for soft tissue massage and trigger point release at superficial muscle layer initially, trigger points and increased noted bil but worse at right side where pt reports most of her pain is. Though one trigger point at Lt bulbocavernosus released. Pt tolerated well, diaphragmatic breathing cued throughout and moist heat applied at lower abdomen at pt request to improve relaxation.  Manual externally, after pt dressed and indicated her pain at coccyx as well. Manual for tissue mobility and decreased tension with x10 sacral springing completed, P>A coccyx mob x10 and Rt side soft tissue mobility completed for improved pain levels and decreased tension. Pt reported she felt better at end of session.  Pt also educated on feminine moisturizers and pelvic wand, handouts given.   10/18/2021 EVAL Examination completed, findings reviewed, pt educated on POC, HEP. Pt motivated to participate in PT and agreeable to attempt recommendations.    PATIENT EDUCATION:  Education details: [id number] Person educated: Patient Education method: Explanation,  Demonstration, Tactile cues, Verbal cues, and Handouts Education comprehension: verbalized understanding and returned demonstration   HOME EXERCISE PROGRAM: [id number]  ASSESSMENT:  CLINICAL IMPRESSION: Patient reports she had relief a couple days after eval with internal work completed during assessment. Pt report intercourse was not as painful as well. Pt session focused on manual work and education, details above. Pt tolerated well, does demonstrate internal tension and trigger points with TTP at superficial and deep pelvic floor muscle layers. Pt would benefit from additional PT to further address deficits.     OBJECTIVE IMPAIRMENTS decreased activity tolerance, decreased coordination, decreased endurance, decreased mobility, decreased strength, increased fascial restrictions, increased muscle spasms, impaired flexibility, impaired tone, improper body mechanics, postural dysfunction, and pain.   ACTIVITY LIMITATIONS carrying, lifting, sitting, standing, squatting, continence, and intercourse  PARTICIPATION LIMITATIONS:  cleaning, laundry, interpersonal relationship, driving, shopping, community activity, and yard work PERSONAL FACTORS Fitness, Past/current experiences, Time since onset of injury/illness/exacerbation, and 1 comorbidity: x5 vaginal births with tearing, multiple symptoms  are also affecting patient's functional outcome.   REHAB POTENTIAL: Good  CLINICAL DECISION MAKING: Evolving/moderate complexity  EVALUATION COMPLEXITY: Moderate   GOALS: Goals reviewed with patient? Yes  SHORT TERM GOALS: Target date: 11/15/2021  Pt to be I with HEP.  Baseline: Goal status: INITIAL  2.  Pt will have 25% less urgency due to bladder retraining and proper voiding mechanics and pelvic floor relaxation techniques.   Baseline:  Goal status: INITIAL  3. Pt will report her BMs are complete due to improved bowel habits and evacuation techniques.  Baseline:  Goal status:  INITIAL   LONG TERM GOALS: Target date: 01/18/2022   Pt to be I with advanced HEP.  Baseline:  Goal status: INITIAL  2.  Pt to demonstrate at least 5/5 bil hip strength for improved pelvic stability and functional squats without leakage.  Baseline:  Goal status: INITIAL  3.  Pt will have 50% less urgency due to bladder retraining and proper voiding mechanics and pelvic floor relaxation techniques.  Baseline:  Goal status: INITIAL  4.  Pt to demonstrate improved coordination of pelvic floor  and breathing mechanics to allow full range of movement of pelvic floor with contraction/ relax/ bulge due to decreased tension and pain at pelvic floor.  Baseline:  Goal status: INITIAL  5.  Pt to report no more than urinary leakage instance per week due to improved symptoms.  Baseline:  Goal status: INITIAL  6.  Pt to demonstrated at least 4/5 strength of pelvic floor without pain and full relaxation without pain at pelvic floor to deceased tension and improved tolerance to vaginal penetration.  Baseline:  Goal status: INITIAL  PLAN: PT FREQUENCY: 1x/week  PT DURATION:  10 sessions  PLANNED INTERVENTIONS: Therapeutic exercises, Therapeutic activity, Neuromuscular re-education, Patient/Family education, Joint mobilization, Aquatic Therapy, Dry Needling, Spinal mobilization, Cryotherapy, Moist heat, Manual lymph drainage, scar mobilization, Taping, Biofeedback, and Manual therapy  PLAN FOR NEXT SESSION: relaxation techniques, moisturizers/lubricants handouts, manual at spine/hips/abdomen, voiding mechanics, possible internal if pt consents and this is appropriate   Otelia Sergeant, PT, DPT 10/26/2310:27 PM

## 2021-10-26 DIAGNOSIS — F331 Major depressive disorder, recurrent, moderate: Secondary | ICD-10-CM | POA: Diagnosis not present

## 2021-10-29 ENCOUNTER — Ambulatory Visit: Payer: Medicaid Other | Admitting: Physical Therapy

## 2021-10-29 DIAGNOSIS — R279 Unspecified lack of coordination: Secondary | ICD-10-CM

## 2021-10-29 DIAGNOSIS — R293 Abnormal posture: Secondary | ICD-10-CM | POA: Diagnosis not present

## 2021-10-29 DIAGNOSIS — M62838 Other muscle spasm: Secondary | ICD-10-CM | POA: Diagnosis not present

## 2021-10-29 DIAGNOSIS — N9419 Other specified dyspareunia: Secondary | ICD-10-CM | POA: Diagnosis not present

## 2021-10-29 DIAGNOSIS — M6281 Muscle weakness (generalized): Secondary | ICD-10-CM

## 2021-11-01 ENCOUNTER — Encounter: Payer: Self-pay | Admitting: Family Medicine

## 2021-11-01 ENCOUNTER — Ambulatory Visit (INDEPENDENT_AMBULATORY_CARE_PROVIDER_SITE_OTHER): Payer: Medicaid Other | Admitting: Family Medicine

## 2021-11-01 VITALS — BP 98/58 | HR 68 | Temp 98.7°F | Wt 159.0 lb

## 2021-11-01 DIAGNOSIS — R110 Nausea: Secondary | ICD-10-CM

## 2021-11-01 DIAGNOSIS — R1011 Right upper quadrant pain: Secondary | ICD-10-CM | POA: Diagnosis not present

## 2021-11-01 DIAGNOSIS — R87612 Low grade squamous intraepithelial lesion on cytologic smear of cervix (LGSIL): Secondary | ICD-10-CM

## 2021-11-01 LAB — CBC WITH DIFFERENTIAL/PLATELET
Basophils Absolute: 0 10*3/uL (ref 0.0–0.2)
Basos: 1 %
EOS (ABSOLUTE): 0.2 10*3/uL (ref 0.0–0.4)
Eos: 3 %
Hematocrit: 38.1 % (ref 34.0–46.6)
Hemoglobin: 12.9 g/dL (ref 11.1–15.9)
Lymphocytes Absolute: 2 10*3/uL (ref 0.7–3.1)
Lymphs: 34 %
MCH: 30.2 pg (ref 26.6–33.0)
MCHC: 33.9 g/dL (ref 31.5–35.7)
MCV: 89 fL (ref 79–97)
Monocytes Absolute: 0.5 10*3/uL (ref 0.1–0.9)
Monocytes: 8 %
Neutrophils Absolute: 3.2 10*3/uL (ref 1.4–7.0)
Neutrophils: 54 %
Platelets: 223 10*3/uL (ref 150–450)
RBC: 4.27 x10E6/uL (ref 3.77–5.28)
RDW: 13.5 % (ref 11.7–15.4)
WBC: 5.8 10*3/uL (ref 3.4–10.8)

## 2021-11-01 LAB — LIPASE: Lipase: 31 U/L (ref 14–72)

## 2021-11-01 MED ORDER — ONDANSETRON HCL 4 MG PO TABS: 4.0000 mg | ORAL_TABLET | Freq: Three times a day (TID) | ORAL | 0 refills | Status: DC | PRN

## 2021-11-01 MED ORDER — OXYCODONE-ACETAMINOPHEN 10-325 MG PO TABS: 1.0000 | ORAL_TABLET | Freq: Three times a day (TID) | ORAL | 0 refills | Status: AC | PRN

## 2021-11-01 MED ORDER — CEFTRIAXONE SODIUM 1 G IJ SOLR
1.0000 g | Freq: Once | INTRAMUSCULAR | Status: AC
  Administered 2021-11-01: 1 g via INTRAMUSCULAR

## 2021-11-01 NOTE — Progress Notes (Signed)
    SUBJECTIVE:   CHIEF COMPLAINT / HPI:   RUQ pain: Patient reports that she had 1 episode of a 9 out of 10 right upper quadrant pain last night.  She took oxycodone for this which helped.  She still not been able to eat solid food, she is able to take fluids by mouth.  Her fever decrease last night, was 100.3 F, she has been taking Tylenol and ibuprofen around-the-clock for fever.  CBC and lipase were within normal limits from yesterday.  HIDA scan scheduled for Monday morning.  CMP misordered and not done, will order today stat.  PERTINENT  PMH / PSH: non-contributory  OBJECTIVE:   BP 123/79   Pulse 62   Ht 5\' 2"  (1.575 m)   Wt 158 lb 6.4 oz (71.8 kg)   LMP 12/19/2019   SpO2 100%   BMI 28.97 kg/m   Nursing note and vitals reviewed GEN: Age-appropriate, Latina woman, resting comfortably in chair, NAD, WNWD, tired appearing Cardiac: Regular rate and rhythm. Normal S1/S2. No murmurs, rubs, or gallops appreciated. 2+ radial pulses. Lungs: Clear bilaterally to ascultation. No increased WOB, no accessory muscle usage. No w/r/r. Abdomen: Soft, tender in right upper quadrant, nondistended, no HSM, normal bowel sounds, positive Murphy sign, negative psoas sign Neuro: AOx3  Ext: no edema Psych: Pleasant and appropriate ASSESSMENT/PLAN:   Right upper quadrant pain Patient appears reassuringly well on exam, afebrile in clinic today.  Decreased fever curve.  Appears well-hydrated, able to take p.o. fluids.  Stat CMP reassuring with a normal, LFTs normal.  With CBC with no signs of infection and lipase normal, this decreases risk of ascending cholangitis, appendicitis, pancreatitis.  Gallbladder disease still likely diagnosis.  Imaging scheduled for Monday.  Discussed return precautions and supportive care over the weekend.  Follow-up next week to discuss imaging.     Tuesday, MD [facility]

## 2021-11-01 NOTE — Patient Instructions (Addendum)
It was a pleasure to see you today!  I am concerned with the fever that you may have an infected gall bladder. We will get stat labs today and I am sending you over for a HIDA scan as soon as possible. We will schedule this and let you know the time. We will give you an antibiotic injection today to cover you for possible infection Follow up tomorrow at 9:30 AM We will give you zofran for nausea and oxycodone for severe pain. You can also take ibuprofen for pain and fever Please stay well hydrated with small sips every 15 minutes If you have severe pain, cannot tolerate taking oral fluids, or severely high fever again, please go to the emergency department or call our office [phone]  Be Well,  Dr. Leary Roca

## 2021-11-01 NOTE — Progress Notes (Signed)
    SUBJECTIVE:   CHIEF COMPLAINT / HPI:   RUQ pain: 46 year old woman presents today with 2 days of severe right upper quadrant pain.  She has been not been able to eat much by mouth, able to drink fluids normally.  She reports her pain is severe 9 out of 10.  She has also had fever for the last 2 days, Tmax 102.9, this morning 101 F.  She reports some nausea, no vomiting, no diarrhea, no rash, no chest pain, SOB, cough, runny nose, sore throat.  Patient has a history of intermittent abdominal pains most of them minor to moderate, and so far mostly associated with somatizations from depression and caregiver burnout.  However, he has not had other concerning findings such as febrile illness, and her PHQ-9 is actually improved from several months ago.  She reports no particular worsening stressors at home.  She reports no other people in her household have similar symptoms.  She still has her appendix and gallbladder.     11/02/2021   10:47 AM 11/01/2021    8:59 AM 08/08/2021    7:50 AM  PHQ9 SCORE ONLY  PHQ-9 Total Score 10 9 19     PERTINENT  PMH / PSH: S/p hysterectomy, MDD, hypothyroid  OBJECTIVE:   BP (!) 98/58   Pulse 68   Temp 98.7 F (37.1 C)   Wt 159 lb (72.1 kg)   LMP 12/19/2019   SpO2 99%   BMI 29.08 kg/m   Nursing note and vitals reviewed GEN: Age-appropriate, Latina woman resting comfortably in chair, NAD, WNWD, appears generally well when entering the room HEENT: NCAT. PERRLA. Sclera without injection or icterus. MMM.  Cardiac: Regular rate and rhythm. Normal S1/S2. No murmurs, rubs, or gallops appreciated. 2+ radial pulses. Lungs: Clear bilaterally to ascultation. No increased WOB, no accessory muscle usage. No w/r/r. Abdomen: Soft, nondistended, moderately tender in right upper quadrant and epigastric region only, normal bowel sounds, positive Murphy sign, no voluntary guarding, no rebound. Neuro: AOx3  Ext: no edema Psych: Pleasant and appropriate    ASSESSMENT/PLAN:   Right upper quadrant pain Patient with concerning findings of significant fever, nausea, positive Murphy sign with right upper quadrant pain x2 days.  She has had a right upper quadrant ultrasound in 2023 that was unremarkable.  Control given this level of pain and fever is cholecystitis with possible evolving cholangitis, pancreatitis, appendicitis, enteritis.  Reassuringly patient appears quite well on exam, does not look acutely ill, is not in any distress.  Gave her strict return precautions if she is unable to tolerate any fluids by mouth, has excruciating pain, has worsening fever, to go to the emergency department tonight. -Obtain stat CMP, CBC, lipase -Obtain HIDA scan ASAP -Treat with 1 g ceftriaxone IM -Zofran for nausea - Oxycodone-acetaminophen 5-325 mg every 6 hours for pain x3 days -Follow-up tomorrow morning     2024, MD [facility]

## 2021-11-02 ENCOUNTER — Ambulatory Visit (INDEPENDENT_AMBULATORY_CARE_PROVIDER_SITE_OTHER): Payer: Medicaid Other | Admitting: Family Medicine

## 2021-11-02 ENCOUNTER — Encounter: Payer: Self-pay | Admitting: Family Medicine

## 2021-11-02 VITALS — BP 123/79 | HR 62 | Ht 62.0 in | Wt 158.4 lb

## 2021-11-02 DIAGNOSIS — R1011 Right upper quadrant pain: Secondary | ICD-10-CM | POA: Insufficient documentation

## 2021-11-02 LAB — COMPREHENSIVE METABOLIC PANEL
ALT: 13 IU/L (ref 0–32)
AST: 31 IU/L (ref 0–40)
Albumin/Globulin Ratio: 1.9 (ref 1.2–2.2)
Albumin: 4.3 g/dL (ref 3.8–4.8)
Alkaline Phosphatase: 46 IU/L (ref 44–121)
BUN/Creatinine Ratio: 26 — ABNORMAL HIGH (ref 9–23)
BUN: 18 mg/dL (ref 6–24)
Bilirubin Total: 0.2 mg/dL (ref 0.0–1.2)
CO2: 24 mmol/L (ref 20–29)
Calcium: 8.9 mg/dL (ref 8.7–10.2)
Chloride: 105 mmol/L (ref 96–106)
Creatinine, Ser: 0.7 mg/dL (ref 0.57–1.00)
Globulin, Total: 2.3 g/dL (ref 1.5–4.5)
Glucose: 90 mg/dL (ref 70–99)
Potassium: 4.3 mmol/L (ref 3.5–5.2)
Sodium: 139 mmol/L (ref 134–144)
Total Protein: 6.6 g/dL (ref 6.0–8.5)
eGFR: 109 mL/min/{1.73_m2} (ref >59)

## 2021-11-02 MED ORDER — CEFTRIAXONE SODIUM 1 G IJ SOLR
1.0000 g | Freq: Once | INTRAMUSCULAR | Status: AC
  Administered 2021-11-02: 1 g via INTRAMUSCULAR

## 2021-11-02 NOTE — Patient Instructions (Addendum)
It was a pleasure to see you today!  You received another injection of antibiotics today which is good for 24 hours If you have intolerable pain that doesn't get better with ibuprofen and oxycodone, you cannot drink anything by mouth for 12-18 hours, please go to the emergency department over the weekend Follow up on Wednesday, July 5th at 9:50 AM to recheck you after the HIDA scan    Be Well,  Dr. Leary Roca

## 2021-11-02 NOTE — Assessment & Plan Note (Signed)
Patient with concerning findings of significant fever, nausea, positive Murphy sign with right upper quadrant pain x2 days.  She has had a right upper quadrant ultrasound in 2023 that was unremarkable.  Control given this level of pain and fever is cholecystitis with possible evolving cholangitis, pancreatitis, appendicitis, enteritis.  Reassuringly patient appears quite well on exam, does not look acutely ill, is not in any distress.  Gave her strict return precautions if she is unable to tolerate any fluids by mouth, has excruciating pain, has worsening fever, to go to the emergency department tonight. -Obtain stat CMP, CBC, lipase -Obtain HIDA scan ASAP -Treat with 1 g ceftriaxone IM -Zofran for nausea - Oxycodone-acetaminophen 5-325 mg every 6 hours for pain x3 days -Follow-up tomorrow morning

## 2021-11-03 DIAGNOSIS — F331 Major depressive disorder, recurrent, moderate: Secondary | ICD-10-CM | POA: Diagnosis not present

## 2021-11-03 DIAGNOSIS — Z419 Encounter for procedure for purposes other than remedying health state, unspecified: Secondary | ICD-10-CM | POA: Diagnosis not present

## 2021-11-04 NOTE — Assessment & Plan Note (Signed)
Patient appears reassuringly well on exam, afebrile in clinic today.  Decreased fever curve.  Appears well-hydrated, able to take p.o. fluids.  Stat CMP reassuring with a normal, LFTs normal.  With CBC with no signs of infection and lipase normal, this decreases risk of ascending cholangitis, appendicitis, pancreatitis.  Gallbladder disease still likely diagnosis.  Imaging scheduled for Monday.  Discussed return precautions and supportive care over the weekend.  Follow-up next week to discuss imaging.

## 2021-11-05 ENCOUNTER — Encounter (INDEPENDENT_AMBULATORY_CARE_PROVIDER_SITE_OTHER): Payer: Self-pay | Admitting: Bariatrics

## 2021-11-05 ENCOUNTER — Ambulatory Visit (HOSPITAL_COMMUNITY)
Admission: RE | Admit: 2021-11-05 | Discharge: 2021-11-05 | Disposition: A | Discharge status: Home / Self Care | Payer: Medicaid Other | Source: Ambulatory Visit | Attending: Family Medicine | Admitting: Family Medicine

## 2021-11-05 ENCOUNTER — Ambulatory Visit (INDEPENDENT_AMBULATORY_CARE_PROVIDER_SITE_OTHER): Payer: Medicaid Other | Admitting: Bariatrics

## 2021-11-05 ENCOUNTER — Ambulatory Visit: Payer: Medicaid Other | Admitting: Physical Therapy

## 2021-11-05 VITALS — BP 103/73 | HR 69 | Temp 97.9°F | Ht 62.0 in | Wt 153.0 lb

## 2021-11-05 DIAGNOSIS — E038 Other specified hypothyroidism: Secondary | ICD-10-CM | POA: Diagnosis not present

## 2021-11-05 DIAGNOSIS — R1011 Right upper quadrant pain: Secondary | ICD-10-CM | POA: Insufficient documentation

## 2021-11-05 DIAGNOSIS — E559 Vitamin D deficiency, unspecified: Secondary | ICD-10-CM | POA: Diagnosis not present

## 2021-11-05 DIAGNOSIS — E6609 Other obesity due to excess calories: Secondary | ICD-10-CM

## 2021-11-05 DIAGNOSIS — Z6828 Body mass index (BMI) 28.0-28.9, adult: Secondary | ICD-10-CM | POA: Diagnosis not present

## 2021-11-05 DIAGNOSIS — E669 Obesity, unspecified: Secondary | ICD-10-CM

## 2021-11-05 MED ORDER — VITAMIN D (ERGOCALCIFEROL) 1.25 MG (50000 UNIT) PO CAPS: 50000.0000 [IU] | ORAL_CAPSULE | ORAL | 0 refills | Status: DC

## 2021-11-05 MED ORDER — TECHNETIUM TC 99M MEBROFENIN IV KIT
5.2000 | PACK | Freq: Once | INTRAVENOUS | Status: AC | PRN
Start: 2021-11-05 — End: 2021-11-05
  Administered 2021-11-05: 5.2 via INTRAVENOUS

## 2021-11-06 NOTE — Progress Notes (Unsigned)
    SUBJECTIVE:   CHIEF COMPLAINT / HPI:   RUQ pain: patient had normal CBC, lipase, Cmp.  HIDA scan was normal with a normal ejection fraction.  Today she reports that she has started to feel a little bit better, her nausea is still present, but she is able to eat a little bit more.  Fevers have stopped.  Her pain is not as intense as it was.  She did have several episodes of diarrhea that looked to have which she thinks were small gallstones, see picture.  Overall patient believes she is improving.  PERTINENT  PMH / PSH: Noncontributory  OBJECTIVE:   BP 99/69   Pulse 84   Ht 5\' 2"  (1.575 m)   Wt 158 lb 6 oz (71.8 kg)   LMP 12/19/2019   SpO2 100%   BMI 28.97 kg/m   Nursing note and vitals reviewed GEN: Age-appropriate, Latina woman, resting comfortably in chair, NAD, WNWD HEENT: Afebrile, NCAT. PERRLA. Sclera without injection or icterus. MMM.  Abdomen: Soft, mildly tender in right upper quadrant, negative Murphy sign, nondistended, normal active bowel sounds, negative psoas sign Neuro: AOx3  Ext: no edema Psych: Pleasant and appropriate  ASSESSMENT/PLAN:   Right upper quadrant pain Reassuringly, patient has improved.  Fevers are gone, her pain has improved, all of the lab work was normal and reassuring, imaging was normal.  While I cannot exactly state with certainty the diagnosis of this episode, certainly viral gastroenteritis is at the top of the list.  Recommend patient continue with tincture of time.  If pain and similar symptoms with fever return, she will likely need a CT A/P.  Discussed supportive care, return precautions.  Follow-up as needed.     12/21/2019, MD [facility]

## 2021-11-06 NOTE — Progress Notes (Unsigned)
Chief Complaint:   OBESITY Carol Welch is here to discuss her progress with her obesity treatment plan along with follow-up of her obesity related diagnoses. Carol Welch is on keeping a food journal and adhering to recommended goals of 1000-1100 calories and 75 grams of protein daily and states she is following her eating plan approximately 90% of the time. [name] states she is walking /running for 20-30 minutes 5-6 times per week.  Today's visit was #: 7 Starting weight: 167 lbs Starting date: 08/08/2021 Today's weight: 153 lbs Today's date: 11/05/2021 Total lbs lost to date: 14 Total lbs lost since last in-office visit: 6  Interim History: [name] is down another 6 pounds since her last visit.  She is drinking more water.  She states that she is sticking to 1,000 calories.  Subjective:   1. Vitamin D insufficiency Carol Welch is taking prescription vitamin D once weekly.  2. Other specified hypothyroidism Carol Welch is taking Synthroid, and she notes fatigue.   Assessment/Plan:   1. Vitamin D insufficiency We will refill prescription vitamin D 50,000 units once weekly for 1 month.  - Vitamin D, Ergocalciferol, (DRISDOL) 1.25 MG (50000 UNIT) CAPS capsule; Take 1 capsule (50,000 Units total) by mouth every 7 (seven) days.  Dispense: 5 capsule; Refill: 0  2. Other specified hypothyroidism Carol Welch will continue her medications as directed.   3. Obesity, Current BMI 28.1 Carol Welch is currently in the action stage of change. As such, her goal is to continue with weight loss efforts. She has agreed to keeping a food journal and adhering to recommended goals of 1000-1100 calories and 75 grams of protein daily.   Meal planning was discussed.  She will continue to adhere closely to the plan 80 to 90%.  Snack sheet was given today.  Exercise goals: As is.   Behavioral modification strategies: increasing lean protein intake, decreasing simple carbohydrates, increasing vegetables, increasing water  intake, decreasing eating out, no skipping meals, meal planning and cooking strategies, keeping healthy foods in the home, and planning for success.  Carol Welch has agreed to follow-up with our clinic in 3 weeks. She was informed of the importance of frequent follow-up visits to maximize her success with intensive lifestyle modifications for her multiple health conditions.   Objective:   Blood pressure 103/73, pulse 69, temperature 97.9 F (36.6 C), height 5\' 2"  (1.575 m), weight 153 lb (69.4 kg), last menstrual period 12/19/2019, SpO2 99 %. Body mass index is 27.98 kg/m.  General: Cooperative, alert, well developed, in no acute distress. HEENT: Conjunctivae and lids unremarkable. Cardiovascular: Regular rhythm.  Lungs: Normal work of breathing. Neurologic: No focal deficits.   Lab Results  Component Value Date   CREATININE 0.70 11/02/2021   BUN 18 11/02/2021   NA 139 11/02/2021   K 4.3 11/02/2021   CL 105 11/02/2021   CO2 24 11/02/2021   Lab Results  Component Value Date   ALT 13 11/02/2021   AST 31 11/02/2021   ALKPHOS 46 11/02/2021   BILITOT 0.2 11/02/2021   Lab Results  Component Value Date   HGBA1C 5.6 08/08/2021   Lab Results  Component Value Date   INSULIN 5.7 08/08/2021   Lab Results  Component Value Date   TSH 1.35 03/20/2021   No results found for: "CHOL", "HDL", "LDLCALC", "LDLDIRECT", "TRIG", "CHOLHDL" Lab Results  Component Value Date   VD25OH 37.3 08/08/2021   VD25OH 41.65 12/05/2020   Lab Results  Component Value Date   WBC 5.8 11/01/2021   HGB 12.9  11/01/2021   HCT 38.1 11/01/2021   MCV 89 11/01/2021   PLT 223 11/01/2021   No results found for: "IRON", "TIBC", "FERRITIN"  Attestation Statements:   Reviewed by clinician on day of visit: allergies, medications, problem list, medical history, surgical history, family history, social history, and previous encounter notes.   Trude Mcburney, am acting as Energy manager for [employer],  DO.  I have reviewed the above documentation for accuracy and completeness, and I agree with the above. Corinna Capra, DO

## 2021-11-07 ENCOUNTER — Ambulatory Visit (INDEPENDENT_AMBULATORY_CARE_PROVIDER_SITE_OTHER): Payer: Medicaid Other | Admitting: Family Medicine

## 2021-11-07 ENCOUNTER — Encounter: Payer: Self-pay | Admitting: Family Medicine

## 2021-11-07 DIAGNOSIS — K219 Gastro-esophageal reflux disease without esophagitis: Secondary | ICD-10-CM

## 2021-11-07 DIAGNOSIS — R1011 Right upper quadrant pain: Secondary | ICD-10-CM | POA: Diagnosis not present

## 2021-11-07 MED ORDER — FAMOTIDINE 20 MG PO TABS: 20.0000 mg | ORAL_TABLET | Freq: Two times a day (BID) | ORAL | 3 refills | Status: DC

## 2021-11-07 NOTE — Assessment & Plan Note (Signed)
Reassuringly, patient has improved.  Fevers are gone, her pain has improved, all of the lab work was normal and reassuring, imaging was normal.  While I cannot exactly state with certainty the diagnosis of this episode, certainly viral gastroenteritis is at the top of the list.  Recommend patient continue with tincture of time.  If pain and similar symptoms with fever return, she will likely need a CT A/P.  Discussed supportive care, return precautions.  Follow-up as needed.

## 2021-11-07 NOTE — Patient Instructions (Addendum)
It was a pleasure to see you today!  Good news, you are getting better! While I am not exactly sure what caused your illness, I am reassured by normal labs, normal HIDA scan, no fever, and your improving pain that you are getting better. If the pain does not improve all the way, or if this returns, please come back for a re-evaluation Also for stomach protection with nausea, you can take famotidine 30 mg once a day   Be Well,  Dr. Leary Roca

## 2021-11-08 ENCOUNTER — Encounter (INDEPENDENT_AMBULATORY_CARE_PROVIDER_SITE_OTHER): Payer: Self-pay | Admitting: Bariatrics

## 2021-11-13 ENCOUNTER — Ambulatory Visit: Payer: Medicaid Other | Attending: Family Medicine | Admitting: Physical Therapy

## 2021-11-13 ENCOUNTER — Encounter: Payer: Self-pay | Admitting: Physical Therapy

## 2021-11-13 DIAGNOSIS — R279 Unspecified lack of coordination: Secondary | ICD-10-CM | POA: Insufficient documentation

## 2021-11-13 DIAGNOSIS — M6281 Muscle weakness (generalized): Secondary | ICD-10-CM | POA: Insufficient documentation

## 2021-11-13 DIAGNOSIS — M62838 Other muscle spasm: Secondary | ICD-10-CM | POA: Diagnosis not present

## 2021-11-13 NOTE — Therapy (Signed)
OUTPATIENT PHYSICAL THERAPY FEMALE PELVIC TREATMENT   Patient Name: Carol Welch MRN: [id number] DOB:07-27-1975, 46 y.o., female Today's Date: 11/13/2021   PT End of Session - 11/13/21 0915     Visit Number 4    Number of Visits 10   had 8 from PT previous   Date for PT Re-Evaluation 01/18/22    Authorization Type [employer]    Authorization - Visit Number 4    Authorization - Number of Visits 10   27 total (used 8 at other PT)   PT Start Time 0905   pt arrival time   PT Stop Time 0930    PT Time Calculation (min) 25 min    Activity Tolerance Patient tolerated treatment well    Behavior During Therapy [facility] for tasks assessed/performed               Past Medical History:  Diagnosis Date   Anal fissure    Colitis    Depression    Edema, lower extremity    Fatty liver    Hypothyroidism    Low blood pressure    PONV (postoperative nausea and vomiting)    SVD (spontaneous vaginal delivery) 06/30/2015   Vitamin D deficiency    Past Surgical History:  Procedure Laterality Date   BREAST CYST ASPIRATION Right 02/13/2018   CYSTOSCOPY N/A 12/30/2019   Procedure: CYSTOSCOPY;  Surgeon: Janyth Contes, MD;  Location: [place];  Service: Gynecology;  Laterality: N/A;   ENDOVENOUS ABLATION SAPHENOUS VEIN W/ LASER Right 10/08/2018   endovenous laser ablation right saphenous vein by Deitra Mayo MD   LAPAROSCOPIC VAGINAL HYSTERECTOMY WITH SALPINGECTOMY Bilateral 12/30/2019   Procedure: LAPAROSCOPIC ASSISTED VAGINAL HYSTERECTOMY WITH SALPINGECTOMY;  Surgeon: Janyth Contes, MD;  Location: [place];  Service: Gynecology;  Laterality: Bilateral;   Patient Active Problem List   Diagnosis Date Noted   Vitamin D insufficiency 11/05/2021   Right upper quadrant pain 11/02/2021   Class 1 obesity due to excess calories with body mass index (BMI) of 30.0 to 30.9 in adult 08/13/2021   Trapezius muscle strain, right, initial encounter  03/20/2021   Encounter for surveillance of abnormal nevi 07/09/2020   Acquired hypothyroidism 07/07/2020   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   Neck pain 01/18/2020   S/P laparoscopic assisted vaginal hysterectomy (LAVH) 12/30/2019   External thrombosed hemorrhoids 09/11/2019   MDD (major depressive disorder) 08/24/2019   Orthostatic hypotension 08/24/2019   Hypothyroidism 07/06/2018   Family history of congenital anomaly 08/27/2016    PCP: Gladys Damme, MD  REFERRING PROVIDER: McDiarmid, Blane Ohara, MD  REFERRING DIAG:  [phone] (ICD-10-CM) - Dyspareunia due to medical condition in female  THERAPY DIAG:  Muscle weakness (generalized)  Unspecified lack of coordination  Other muscle spasm  Rationale for Evaluation and Treatment Rehabilitation  ONSET DATE: over a year ago  SUBJECTIVE:  SUBJECTIVE STATEMENT: Pt reports internal pain has been much better, she has been sick the last week but feeling better now. Pt reports her pain is more local to lower Rt back and hip today and would like to work here.   Fluid intake: Yes: 6-7 glasses of water, coffee once per day     PAIN:  Are you having pain? Yes NPRS scale: 5/10  Pain location:  Rt gluteal and low back  Pain type: pinching Pain description: constant  Aggravating factors: prolonged sitting, biking, prolonged standing, gardening Relieving factors: massage, meds, hot or cold water  PRECAUTIONS: None  WEIGHT BEARING RESTRICTIONS No  FALLS:  Has patient fallen in last 6 months? No  LIVING ENVIRONMENT: Lives with: lives with their family Lives in: House/apartment   OCCUPATION: not currently working  PLOF: Independent  PATIENT GOALS to have less pain and less leakage  PERTINENT HISTORY:   Colitis,  depression, hypothyroidism, vitamin D deficiency, fatty live Sexual abuse: No  BOWEL MOVEMENT Pain with bowel movement: No Type of bowel movement:Type (Bristol Stool Scale) 2-4, Frequency 1-2x per day, and Strain Yes Fully empty rectum: No Leakage: No Pads: No Fiber supplement: Yes: stool softener daily   URINATION Pain with urination: No Fully empty bladder: No Stream: Strong Urgency: Yes:   Frequency: every 2 hours Leakage:  randomly  Pads: No  INTERCOURSE Pain with intercourse: Initial Penetration and During Penetration Ability to have vaginal penetration:  Yes:   Climax: sometimes is able other limited with pain Marinoff Scale: 2/3  PREGNANCY Vaginal deliveries 5 Tearing Yes: did have tearing with the first few and needed stitches, did not have stitches on fourth but had tearing C-section deliveries 0 Currently pregnant No  PROLAPSE None    OBJECTIVE:   DIAGNOSTIC FINDINGS:   PATIENT SURVEYS:    [id number]  COGNITION:  Overall cognitive status: Within functional limits for tasks assessed     SENSATION:  Light touch: Deficits pt reports intermittent Rt leg tingling from hip to foot  Proprioception: Appears intact  MUSCLE LENGTH: Bil hamstrings and adductors limited by 25%  LUMBAR SPECIAL TESTS:  SI Compression/distraction test: Positive, FABER test: Positive, and Gaenslen's test: Positive all (+) at Rt for pain at Rt SIJ and glute per pt                POSTURE: rounded shoulders and posterior pelvic tilt  LOWER EXTREMITY ROM:  Bil WFL  LOWER EXTREMITY MMT:  All assessed in sidelying Rt hip abduction 3+/5, extension and adduction 4/5, flexion 4+/5; Lt hip grossly 4/5    PALPATION:   General  TTP at Rt iliopsoas, fascial restrictions throughout abdomen and mild TTP over bladder at suprapubic region, TTP at Rt thoracic and lumbar paraspinals, Rt SIJ, Rt piriformis, coccyx at midline and at tip with muscle tension noted at Rt side                 External Perineal Exam TTP at Rt bulbocavernosus, dryness noted at vulva                              Internal Pelvic Floor TTP at bil bulbocavernosus, Rt pubococcygeus, bil obturator internus and iliococcygeus with trigger points throughout pelvic floor at both deep and superficial layers.   Patient confirms identification and approves PT to assess internal pelvic floor and treatment Yes  PELVIC MMT:   MMT eval  Vaginal 4/5; 9s isometric; 7 reps  Internal Anal Sphincter   External Anal Sphincter   Puborectalis   Diastasis Recti   (Blank rows = not tested)        TONE: Increased throughout   PROLAPSE: Possible grade 1 anterior wall laxity in hooklying with cough   TODAY'S TREATMENT   11/13/21: There ex: Bil piriformis stretch 2x30s Bil hamstring with strap stretch 2x30s Bil lower lumbar trunk rotation with both knees bent x10 Moist heat applied with x5 layers of cloth between skin and pad during stretches for pt comfort and relaxation. Pt reports this helps pain levels.  NMRE: Quad - ant/post rocking with manual at anococcygeal ligament to elongate and decrease pain with forward/back and diagonals Tail wags x10 Deep squat 2x30s  All with cues for pelvic relaxation and diaphragmatic breathing   10/29/21: Forward hip hinge with bil hip IR 5x10s Hip shifting x10 5s Quad - ant/post rocking x5 with hip IR and x5 with ER both felt stretching at coccyx per pt 10s each  Manual internal rectal treatment - pt consented reporting after stretching today and where her pain she feels this would help her more than vaginal manual work, pt educated on difference with muscle orientation and pt agreed. Pt found to have large trigger point at 12-1 on clock face at pubococcygeus and iliococcygeus and TTP at distal end of coccyx. Gentle over pressure provided in these areas for decreased tension and improved mobility and pain control. Trigger point did not fully release but pt did verbalize this  replicated her pain felt at Rt hip and back. "That is the spot that causes my pain", per pt. PT also provided A>P coccyx mobs which pt reported improved pain slightly. Pt reports she could feel release internally and very motivated to continue stretching and HEP at home to see if this helped.   10/25/21: Manual work: pt consented to internal vaginal treatment for soft tissue massage and trigger point release at superficial muscle layer initially, trigger points and increased noted bil but worse at right side where pt reports most of her pain is. Though one trigger point at Lt bulbocavernosus released. Pt tolerated well, diaphragmatic breathing cued throughout and moist heat applied at lower abdomen at pt request to improve relaxation.  Manual externally, after pt dressed and indicated her pain at coccyx as well. Manual for tissue mobility and decreased tension with x10 sacral springing completed, P>A coccyx mob x10 and Rt side soft tissue mobility completed for improved pain levels and decreased tension. Pt reported she felt better at end of session.  Pt also educated on feminine moisturizers and pelvic wand, handouts given.    PATIENT EDUCATION:  Education details: [id number] Person educated: Patient Education method: Explanation, Demonstration, Tactile cues, Verbal cues, and Handouts Education comprehension: verbalized understanding and returned demonstration   HOME EXERCISE PROGRAM: [id number]  ASSESSMENT:  CLINICAL IMPRESSION: Patient has been sick this last week but feeling better, pt reports she has noticed improvement with pain from internal region but pain more at lower rt back/hip today. Pt session focused on stretching with moist heat applied during stretches for relaxation and decreased pain. Pt tolerated well but did express pain intermittently with mobility. Pt reports at home she feels sore after stretches but then feels better and a relief from pain. Pt would benefit from additional  PT to further address deficits.     OBJECTIVE IMPAIRMENTS decreased activity tolerance, decreased coordination, decreased endurance, decreased mobility, decreased strength, increased fascial restrictions, increased muscle spasms, impaired flexibility, impaired tone, improper  body mechanics, postural dysfunction, and pain.   ACTIVITY LIMITATIONS carrying, lifting, sitting, standing, squatting, continence, and intercourse  PARTICIPATION LIMITATIONS:  cleaning, laundry, interpersonal relationship, driving, shopping, community activity, and yard work [place], Past/current experiences, Time since onset of injury/illness/exacerbation, and 1 comorbidity: x5 vaginal births with tearing, multiple symptoms  are also affecting patient's functional outcome.   REHAB POTENTIAL: Good  CLINICAL DECISION MAKING: Evolving/moderate complexity  EVALUATION COMPLEXITY: Moderate   GOALS: Goals reviewed with patient? Yes  SHORT TERM GOALS: Target date: 11/15/2021  Pt to be I with HEP.  Baseline: Goal status: MET 11/13/21   2.  Pt will have 25% less urgency due to bladder retraining and proper voiding mechanics and pelvic floor relaxation techniques.   Baseline:  Goal status: MET 11/13/21   3. Pt will report her BMs are complete due to improved bowel habits and evacuation techniques.  Baseline:  Goal status: MET 11/13/21    LONG TERM GOALS: Target date: 01/18/2022   Pt to be I with advanced HEP.  Baseline:  Goal status: INITIAL  2.  Pt to demonstrate at least 5/5 bil hip strength for improved pelvic stability and functional squats without leakage.  Baseline:  Goal status: INITIAL  3.  Pt will have 50% less urgency due to bladder retraining and proper voiding mechanics and pelvic floor relaxation techniques.  Baseline:  Goal status: INITIAL  4.  Pt to demonstrate improved coordination of pelvic floor and breathing mechanics to allow full range of movement of pelvic floor with  contraction/ relax/ bulge due to decreased tension and pain at pelvic floor.  Baseline:  Goal status: INITIAL  5.  Pt to report no more than urinary leakage instance per week due to improved symptoms.  Baseline:  Goal status: INITIAL  6.  Pt to demonstrated at least 4/5 strength of pelvic floor without pain and full relaxation without pain at pelvic floor to deceased tension and improved tolerance to vaginal penetration.  Baseline:  Goal status: INITIAL  PLAN: PT FREQUENCY: 1x/week  PT DURATION:  10 sessions  PLANNED INTERVENTIONS: Therapeutic exercises, Therapeutic activity, Neuromuscular re-education, Patient/Family education, Joint mobilization, Aquatic Therapy, Dry Needling, Spinal mobilization, Cryotherapy, Moist heat, Manual lymph drainage, scar mobilization, Taping, Biofeedback, and Manual therapy  PLAN FOR NEXT SESSION: relaxation techniques, moisturizers/lubricants handouts, manual at spine/hips/abdomen, voiding mechanics, possible internal if pt consents and this is appropriate   Stacy Gardner, PT, DPT 07/11/239:34 AM

## 2021-11-15 NOTE — Progress Notes (Signed)
TeleHealth Visit:  This visit was completed with telemedicine (audio/video) technology. Carol Welch has verbally consented to this TeleHealth visit. The patient is located at home, the provider is located at home. The participants in this visit include the listed provider and patient. The visit was conducted today via MyChart video.  OBESITY Carol Welch is here to discuss her progress with her obesity treatment plan along with follow-up of her obesity related diagnoses.   Today's visit was # 8 Starting weight: 167 lbs Starting date: 08/08/2021 Weight at last in office visit: 153 lbs on 11/05/21 Total weight loss: 14 lbs at last in office visit on 11/05/21. Today's reported weight: 154 lbs   Nutrition Plan: keeping a food journal and adhering to recommended goals of 1000-1100 calories and 75 gms protein.  Hunger is well controlled.  Current exercise:  walking 30 minutes per day 4-5 times per week.   Interim History: She is doing an excellent job with the meal plan.  She is journaling every day, meets calorie goals 100% of the time, meets protein goals 80 to 90% of the time.  Sometimes she supplements with a protein shake.  She snacks on raw vegetables.  She cooks for her family every day at dinner and they are eating what she eats most of the time.  She is nearly at her weight goal of 145 pounds-26 BMI.  Assessment/Plan:  1. Insulin Resistance A1c is normal and fasting insulin is very mildly elevated. Polyphagia well controlled with metformin. Medication(s): Metformin 500 mg daily with breakfast. Lab Results  Component Value Date   HGBA1C 5.6 08/08/2021   Lab Results  Component Value Date   INSULIN 5.7 08/08/2021    Plan Continue metformin 500 mg daily with breakfast.  2. Obesity: Current BMI 27.9 Carol Welch is currently in the action stage of change. As such, her goal is to continue with weight loss efforts.  She has agreed to keeping a food journal and adhering to recommended  goals of 1000-1100 calories and 75 gms protein. Marland Kitchen   Exercise goals: as is.  Behavioral modification strategies: increasing lean protein intake and decreasing simple carbohydrates.  Carol Welch has agreed to follow-up with our clinic in 2 weeks.   No orders of the defined types were placed in this encounter.   There are no discontinued medications.   No orders of the defined types were placed in this encounter.     Objective:   VITALS: Per patient if applicable, see vitals. GENERAL: Alert and in no acute distress. CARDIOPULMONARY: No increased WOB. Speaking in clear sentences.  PSYCH: Pleasant and cooperative. Speech normal rate and rhythm. Affect is appropriate. Insight and judgement are appropriate. Attention is focused, linear, and appropriate.  NEURO: Oriented as arrived to appointment on time with no prompting.   Lab Results  Component Value Date   CREATININE 0.70 11/02/2021   BUN 18 11/02/2021   NA 139 11/02/2021   K 4.3 11/02/2021   CL 105 11/02/2021   CO2 24 11/02/2021   Lab Results  Component Value Date   ALT 13 11/02/2021   AST 31 11/02/2021   ALKPHOS 46 11/02/2021   BILITOT 0.2 11/02/2021   Lab Results  Component Value Date   HGBA1C 5.6 08/08/2021   Lab Results  Component Value Date   INSULIN 5.7 08/08/2021   Lab Results  Component Value Date   TSH 1.35 03/20/2021   No results found for: "CHOL", "HDL", "LDLCALC", "LDLDIRECT", "TRIG", "CHOLHDL" Lab Results  Component Value Date   WBC  5.8 11/01/2021   HGB 12.9 11/01/2021   HCT 38.1 11/01/2021   MCV 89 11/01/2021   PLT 223 11/01/2021   No results found for: "IRON", "TIBC", "FERRITIN" Lab Results  Component Value Date   VD25OH 37.3 08/08/2021   VD25OH 41.65 12/05/2020    Attestation Statements:   Reviewed by clinician on day of visit: allergies, medications, problem list, medical history, surgical history, family history, social history, and previous encounter notes.  Time spent on visit  including the items listed below was 32 minutes.  -preparing to see the patient (e.g., review of tests, history, previous notes) -obtaining and/or reviewing separately obtained history -counseling and educating the patient/family/caregiver -documenting clinical information in the electronic or other health record

## 2021-11-17 DIAGNOSIS — F331 Major depressive disorder, recurrent, moderate: Secondary | ICD-10-CM | POA: Diagnosis not present

## 2021-11-20 ENCOUNTER — Ambulatory Visit: Payer: Medicaid Other | Admitting: Physical Therapy

## 2021-11-20 ENCOUNTER — Encounter (INDEPENDENT_AMBULATORY_CARE_PROVIDER_SITE_OTHER): Payer: Self-pay | Admitting: Family Medicine

## 2021-11-20 ENCOUNTER — Telehealth (INDEPENDENT_AMBULATORY_CARE_PROVIDER_SITE_OTHER): Payer: Medicaid Other | Admitting: Family Medicine

## 2021-11-20 DIAGNOSIS — E8881 Metabolic syndrome: Secondary | ICD-10-CM

## 2021-11-20 DIAGNOSIS — E6609 Other obesity due to excess calories: Secondary | ICD-10-CM

## 2021-11-20 DIAGNOSIS — E669 Obesity, unspecified: Secondary | ICD-10-CM

## 2021-11-20 DIAGNOSIS — Z6827 Body mass index (BMI) 27.0-27.9, adult: Secondary | ICD-10-CM | POA: Diagnosis not present

## 2021-11-20 DIAGNOSIS — E88819 Insulin resistance, unspecified: Secondary | ICD-10-CM

## 2021-11-21 DIAGNOSIS — F331 Major depressive disorder, recurrent, moderate: Secondary | ICD-10-CM | POA: Diagnosis not present

## 2021-11-22 ENCOUNTER — Ambulatory Visit: Payer: Medicaid Other | Admitting: Physical Therapy

## 2021-11-27 ENCOUNTER — Telehealth: Payer: Self-pay | Admitting: Physical Therapy

## 2021-11-27 ENCOUNTER — Ambulatory Visit: Payer: Medicaid Other | Admitting: Physical Therapy

## 2021-11-27 NOTE — Telephone Encounter (Signed)
PT called pt about this morning's appointment at 0800. Pt did not answer, voicemail left.    Otelia Sergeant, PT, DPT 07/25/239:12 AM

## 2021-11-29 ENCOUNTER — Other Ambulatory Visit (INDEPENDENT_AMBULATORY_CARE_PROVIDER_SITE_OTHER): Payer: Self-pay | Admitting: Bariatrics

## 2021-11-29 DIAGNOSIS — E88819 Insulin resistance, unspecified: Secondary | ICD-10-CM

## 2021-11-29 DIAGNOSIS — E8881 Metabolic syndrome: Secondary | ICD-10-CM

## 2021-12-03 ENCOUNTER — Ambulatory Visit (INDEPENDENT_AMBULATORY_CARE_PROVIDER_SITE_OTHER): Payer: Medicaid Other | Admitting: Bariatrics

## 2021-12-04 ENCOUNTER — Ambulatory Visit: Payer: Medicaid Other | Admitting: Physical Therapy

## 2021-12-04 DIAGNOSIS — Z419 Encounter for procedure for purposes other than remedying health state, unspecified: Secondary | ICD-10-CM | POA: Diagnosis not present

## 2021-12-11 ENCOUNTER — Other Ambulatory Visit (INDEPENDENT_AMBULATORY_CARE_PROVIDER_SITE_OTHER): Payer: Self-pay | Admitting: Bariatrics

## 2021-12-11 ENCOUNTER — Encounter (INDEPENDENT_AMBULATORY_CARE_PROVIDER_SITE_OTHER): Payer: Self-pay | Admitting: Bariatrics

## 2021-12-11 ENCOUNTER — Ambulatory Visit: Payer: Medicaid Other | Attending: Family Medicine | Admitting: Physical Therapy

## 2021-12-11 ENCOUNTER — Ambulatory Visit (INDEPENDENT_AMBULATORY_CARE_PROVIDER_SITE_OTHER): Payer: Medicaid Other | Admitting: Bariatrics

## 2021-12-11 ENCOUNTER — Telehealth: Payer: Self-pay | Admitting: Physical Therapy

## 2021-12-11 VITALS — BP 107/65 | HR 60 | Temp 97.9°F | Ht 62.0 in | Wt 152.0 lb

## 2021-12-11 DIAGNOSIS — E8881 Metabolic syndrome: Secondary | ICD-10-CM | POA: Diagnosis not present

## 2021-12-11 DIAGNOSIS — Z7984 Long term (current) use of oral hypoglycemic drugs: Secondary | ICD-10-CM | POA: Diagnosis not present

## 2021-12-11 DIAGNOSIS — M62838 Other muscle spasm: Secondary | ICD-10-CM | POA: Insufficient documentation

## 2021-12-11 DIAGNOSIS — E669 Obesity, unspecified: Secondary | ICD-10-CM

## 2021-12-11 DIAGNOSIS — R279 Unspecified lack of coordination: Secondary | ICD-10-CM | POA: Insufficient documentation

## 2021-12-11 DIAGNOSIS — E559 Vitamin D deficiency, unspecified: Secondary | ICD-10-CM | POA: Diagnosis not present

## 2021-12-11 DIAGNOSIS — Z6827 Body mass index (BMI) 27.0-27.9, adult: Secondary | ICD-10-CM

## 2021-12-11 DIAGNOSIS — M6281 Muscle weakness (generalized): Secondary | ICD-10-CM | POA: Insufficient documentation

## 2021-12-11 DIAGNOSIS — R293 Abnormal posture: Secondary | ICD-10-CM | POA: Insufficient documentation

## 2021-12-11 DIAGNOSIS — E6609 Other obesity due to excess calories: Secondary | ICD-10-CM | POA: Insufficient documentation

## 2021-12-11 MED ORDER — METFORMIN HCL 500 MG PO TABS: 500.0000 mg | ORAL_TABLET | Freq: Every day | ORAL | 0 refills | Status: DC

## 2021-12-11 MED ORDER — VITAMIN D (ERGOCALCIFEROL) 1.25 MG (50000 UNIT) PO CAPS: 50000.0000 [IU] | ORAL_CAPSULE | ORAL | 0 refills | Status: DC

## 2021-12-11 NOTE — Telephone Encounter (Signed)
PT called pt about this morning's appointment at 845. Pt did not answer, voicemail left.   This is pt's second missed appointment a row, message left also explaining attendance policy.   Otelia Sergeant, PT, DPT 12/12/2310:39 PM

## 2021-12-12 ENCOUNTER — Encounter (INDEPENDENT_AMBULATORY_CARE_PROVIDER_SITE_OTHER): Payer: Self-pay

## 2021-12-14 DIAGNOSIS — F331 Major depressive disorder, recurrent, moderate: Secondary | ICD-10-CM | POA: Diagnosis not present

## 2021-12-17 ENCOUNTER — Telehealth (INDEPENDENT_AMBULATORY_CARE_PROVIDER_SITE_OTHER): Payer: Medicaid Other | Admitting: Family Medicine

## 2021-12-18 ENCOUNTER — Ambulatory Visit: Payer: Medicaid Other | Admitting: Physical Therapy

## 2021-12-18 DIAGNOSIS — R293 Abnormal posture: Secondary | ICD-10-CM

## 2021-12-18 DIAGNOSIS — M6281 Muscle weakness (generalized): Secondary | ICD-10-CM

## 2021-12-18 DIAGNOSIS — M62838 Other muscle spasm: Secondary | ICD-10-CM | POA: Diagnosis not present

## 2021-12-18 DIAGNOSIS — R279 Unspecified lack of coordination: Secondary | ICD-10-CM

## 2021-12-18 NOTE — Progress Notes (Unsigned)
     Chief Complaint:   OBESITY Zipporah is here to discuss her progress with her obesity treatment plan along with follow-up of her obesity related diagnoses. Trinidi is on {MWMwtlossportion/plan2:23431} and states she is following her eating plan approximately ***% of the time. Katricia states she is *** *** minutes *** times per week.  Today's visit was #: *** Starting weight: *** Starting date: *** Today's weight: *** Today's date: 12/11/2021 Total lbs lost to date: *** Total lbs lost since last in-office visit: ***  Interim History: ***  Subjective:   1. Insulin resistance ***  2. Vitamin D insufficiency ***  Assessment/Plan:   1. Insulin resistance *** - metFORMIN (GLUCOPHAGE) 500 MG tablet; Take 1 tablet (500 mg total) by mouth daily with breakfast.  Dispense: 30 tablet; Refill: 0  2. Vitamin D insufficiency *** - Vitamin D, Ergocalciferol, (DRISDOL) 1.25 MG (50000 UNIT) CAPS capsule; Take 1 capsule (50,000 Units total) by mouth every 7 (seven) days.  Dispense: 5 capsule; Refill: 0  3. Obesity, Current BMI 27.8 [place] {CHL AMB IS/IS NOT:210130109} currently in the action stage of change. As such, her goal is to {MWMwtloss#1:210800005}. She has agreed to {MWMwtlossportion/plan2:23431}.   Exercise goals: {MWM EXERCISE RECS:23473}  Behavioral modification strategies: {MWMwtlossdietstrategies3:23432}.  Dorthea has agreed to follow-up with our clinic in {NUMBER 1-10:22536} weeks. She was informed of the importance of frequent follow-up visits to maximize her success with intensive lifestyle modifications for her multiple health conditions.   Objective:   Blood pressure 107/65, pulse 60, temperature 97.9 F (36.6 C), height 5\' 2"  (1.575 m), weight 152 lb (68.9 kg), last menstrual period 12/19/2019, SpO2 98 %. Body mass index is 27.8 kg/m.  General: Cooperative, alert, well developed, in no acute distress. HEENT: Conjunctivae and lids unremarkable. Cardiovascular:  Regular rhythm.  Lungs: Normal work of breathing. Neurologic: No focal deficits.   Lab Results  Component Value Date   CREATININE 0.70 11/02/2021   BUN 18 11/02/2021   NA 139 11/02/2021   K 4.3 11/02/2021   CL 105 11/02/2021   CO2 24 11/02/2021   Lab Results  Component Value Date   ALT 13 11/02/2021   AST 31 11/02/2021   ALKPHOS 46 11/02/2021   BILITOT 0.2 11/02/2021   Lab Results  Component Value Date   HGBA1C 5.6 08/08/2021   Lab Results  Component Value Date   INSULIN 5.7 08/08/2021   Lab Results  Component Value Date   TSH 1.35 03/20/2021   No results found for: "CHOL", "HDL", "LDLCALC", "LDLDIRECT", "TRIG", "CHOLHDL" Lab Results  Component Value Date   VD25OH 37.3 08/08/2021   VD25OH 41.65 12/05/2020   Lab Results  Component Value Date   WBC 5.8 11/01/2021   HGB 12.9 11/01/2021   HCT 38.1 11/01/2021   MCV 89 11/01/2021   PLT 223 11/01/2021   No results found for: "IRON", "TIBC", "FERRITIN"  Attestation Statements:   Reviewed by clinician on day of visit: allergies, medications, problem list, medical history, surgical history, family history, social history, and previous encounter notes.   11/03/2021, am acting as Trude Mcburney for Energy manager, DO.  I have reviewed the above documentation for accuracy and completeness, and I agree with the above. -  ***

## 2021-12-18 NOTE — Therapy (Signed)
OUTPATIENT PHYSICAL THERAPY FEMALE PELVIC TREATMENT   Patient Name: Carol Welch MRN: [id number] DOB:11-Sep-1975, 46 y.o., female Today's Date: 12/18/2021   PT End of Session - 12/18/21 0852     Visit Number 5    Number of Visits 10   had 8 from PT previous   Date for PT Re-Evaluation 01/18/22    Authorization Type [employer]    Authorization - Visit Number 4    Authorization - Number of Visits 10   27 total (used 8 at other PT)   PT Start Time 0850    PT Stop Time 0928    PT Time Calculation (min) 38 min    Activity Tolerance Patient tolerated treatment well    Behavior During Therapy [facility] for tasks assessed/performed               Past Medical History:  Diagnosis Date   Anal fissure    Colitis    Depression    Edema, lower extremity    Fatty liver    Hypothyroidism    Low blood pressure    PONV (postoperative nausea and vomiting)    SVD (spontaneous vaginal delivery) 06/30/2015   Vitamin D deficiency    Past Surgical History:  Procedure Laterality Date   BREAST CYST ASPIRATION Right 02/13/2018   CYSTOSCOPY N/A 12/30/2019   Procedure: CYSTOSCOPY;  Surgeon: Janyth Contes, MD;  Location: [place];  Service: Gynecology;  Laterality: N/A;   ENDOVENOUS ABLATION SAPHENOUS VEIN W/ LASER Right 10/08/2018   endovenous laser ablation right saphenous vein by Deitra Mayo MD   LAPAROSCOPIC VAGINAL HYSTERECTOMY WITH SALPINGECTOMY Bilateral 12/30/2019   Procedure: LAPAROSCOPIC ASSISTED VAGINAL HYSTERECTOMY WITH SALPINGECTOMY;  Surgeon: Janyth Contes, MD;  Location: [place];  Service: Gynecology;  Laterality: Bilateral;   Patient Active Problem List   Diagnosis Date Noted   Insulin resistance 12/11/2021   Obesity, Current BMI 27.8 12/11/2021   Vitamin D insufficiency 11/05/2021   Right upper quadrant pain 11/02/2021   Class 1 obesity due to excess calories with body mass index (BMI) of 30.0 to 30.9 in adult 08/13/2021    Trapezius muscle strain, right, initial encounter 03/20/2021   Encounter for surveillance of abnormal nevi 07/09/2020   Acquired hypothyroidism 07/07/2020   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   Neck pain 01/18/2020   S/P laparoscopic assisted vaginal hysterectomy (LAVH) 12/30/2019   External thrombosed hemorrhoids 09/11/2019   MDD (major depressive disorder) 08/24/2019   Orthostatic hypotension 08/24/2019   Hypothyroidism 07/06/2018   Family history of congenital anomaly 08/27/2016    PCP: Gladys Damme, MD  REFERRING PROVIDER: McDiarmid, Blane Ohara, MD  REFERRING DIAG:  [phone] (ICD-10-CM) - Dyspareunia due to medical condition in female  THERAPY DIAG:  Muscle weakness (generalized)  Other muscle spasm  Abnormal posture  Unspecified lack of coordination  Rationale for Evaluation and Treatment Rehabilitation  ONSET DATE: over a year ago  SUBJECTIVE:  SUBJECTIVE STATEMENT: Pt reports she does have days where pain is high (7/10) with her being more active at home or with kids and others without pain. When she is having pain HEP does help to lower it.   Fluid intake: Yes: 6-7 glasses of water, coffee once per day     PAIN:  Are you having pain? Yes NPRS scale: 4/10  Pain location:  Rt gluteal and low back  Pain type: tightness Pain description: constant  Aggravating factors: prolonged sitting, biking, prolonged standing, gardening Relieving factors: massage, meds, hot or cold water, HEP  PRECAUTIONS: None  WEIGHT BEARING RESTRICTIONS No  FALLS:  Has patient fallen in last 6 months? No  LIVING ENVIRONMENT: Lives with: lives with their family Lives in: House/apartment   OCCUPATION: not currently working  PLOF: Independent  PATIENT GOALS to have less  pain and less leakage  PERTINENT HISTORY:   Colitis, depression, hypothyroidism, vitamin D deficiency, fatty live Sexual abuse: No  BOWEL MOVEMENT Pain with bowel movement: No Type of bowel movement:Type (Bristol Stool Scale) 2-4, Frequency 1-2x per day, and Strain Yes Fully empty rectum: No Leakage: No Pads: No Fiber supplement: Yes: stool softener daily   URINATION Pain with urination: No Fully empty bladder: No Stream: Strong Urgency: Yes:   Frequency: every 2 hours Leakage:  randomly  Pads: No  INTERCOURSE Pain with intercourse: Initial Penetration and During Penetration Ability to have vaginal penetration:  Yes:   [place]: sometimes is able other limited with pain Marinoff Scale: 2/3  PREGNANCY Vaginal deliveries 5 Tearing Yes: did have tearing with the first few and needed stitches, did not have stitches on fourth but had tearing C-section deliveries 0 Currently pregnant No  PROLAPSE None    OBJECTIVE:   DIAGNOSTIC FINDINGS:   PATIENT SURVEYS:    [id number] [id number]  COGNITION:  Overall cognitive status: Within functional limits for tasks assessed     SENSATION:  Light touch: Deficits pt reports intermittent Rt leg tingling from hip to foot  Proprioception: Appears intact  MUSCLE LENGTH: Bil hamstrings and adductors limited by 25%  LUMBAR SPECIAL TESTS:  SI Compression/distraction test: Positive, FABER test: Positive, and Gaenslen's test: Positive all (+) at Rt for pain at Rt SIJ and glute per pt                POSTURE: rounded shoulders and posterior pelvic tilt  LOWER EXTREMITY ROM:  Bil WFL  LOWER EXTREMITY MMT:  All assessed in sidelying Rt hip abduction 3+/5, extension and adduction 4/5, flexion 4+/5; Lt hip grossly 4/5    PALPATION:   General  TTP at Rt iliopsoas, fascial restrictions throughout abdomen and mild TTP over bladder at suprapubic region, TTP at Rt thoracic and lumbar paraspinals, Rt SIJ, Rt piriformis, coccyx at midline and at  tip with muscle tension noted at Rt side                External Perineal Exam TTP at Rt bulbocavernosus, dryness noted at vulva                              Internal Pelvic Floor TTP at bil bulbocavernosus, Rt pubococcygeus, bil obturator internus and iliococcygeus with trigger points throughout pelvic floor at both deep and superficial layers.   Patient confirms identification and approves PT to assess internal pelvic floor and treatment Yes  PELVIC MMT:   MMT eval  Vaginal 4/5; 9s isometric; 7 reps  Internal  Anal Sphincter   External Anal Sphincter   Puborectalis   Diastasis Recti   (Blank rows = not tested)        TONE: Increased throughout   PROLAPSE: Possible grade 1 anterior wall laxity in hooklying with cough   TODAY'S TREATMENT   12/18/2021: There ex: Bil piriformis stretch 2x30s Bil hamstring with strap stretch 2x30s Unilateral hip IR stretch 2x30s Moist heat applied with x5 layers of cloth between skin and pad during stretches for pt comfort and relaxation. Pt reports this helps pain levels. Manual: Sacral springing with pt in prone Manual tissue release at rt piriformis and gluteal  External P>A coccyx grade 1-2 mobs  11/13/21: There ex: Bil piriformis stretch 2x30s Bil hamstring with strap stretch 2x30s Bil lower lumbar trunk rotation with both knees bent x10 Moist heat applied with x5 layers of cloth between skin and pad during stretches for pt comfort and relaxation. Pt reports this helps pain levels.  NMRE: Quad - ant/post rocking with manual at anococcygeal ligament to elongate and decrease pain with forward/back and diagonals Tail wags x10 Deep squat 2x30s  All with cues for pelvic relaxation and diaphragmatic breathing     PATIENT EDUCATION:  Education details: [id number] Person educated: Patient Education method: Explanation, Demonstration, Tactile cues, Verbal cues, and Handouts Education comprehension: verbalized understanding and returned  demonstration   HOME EXERCISE PROGRAM: [id number]  ASSESSMENT:  CLINICAL IMPRESSION: Patient reports overall she can tell she is improving does have days with highest pain 7/10 but is also having days without pain at all, depending on activity levels. Pt session focused on stretching for hip and posterior pelvic floor mobility and manual at coccyx. Pt had son with her and declined internal work today, planning for next session. Pt reports at home she feels sore after stretches but then feels better and a relief from pain. Pt would benefit from additional PT to further address deficits.     OBJECTIVE IMPAIRMENTS decreased activity tolerance, decreased coordination, decreased endurance, decreased mobility, decreased strength, increased fascial restrictions, increased muscle spasms, impaired flexibility, impaired tone, improper body mechanics, postural dysfunction, and pain.   ACTIVITY LIMITATIONS carrying, lifting, sitting, standing, squatting, continence, and intercourse  PARTICIPATION LIMITATIONS:  cleaning, laundry, interpersonal relationship, driving, shopping, community activity, and yard work [place], Past/current experiences, Time since onset of injury/illness/exacerbation, and 1 comorbidity: x5 vaginal births with tearing, multiple symptoms  are also affecting patient's functional outcome.   REHAB POTENTIAL: Good  CLINICAL DECISION MAKING: Evolving/moderate complexity  EVALUATION COMPLEXITY: Moderate   GOALS: Goals reviewed with patient? Yes  SHORT TERM GOALS: Target date: 11/15/2021  Pt to be I with HEP.  Baseline: Goal status: MET 11/13/21   2.  Pt will have 25% less urgency due to bladder retraining and proper voiding mechanics and pelvic floor relaxation techniques.   Baseline:  Goal status: MET 11/13/21   3. Pt will report her BMs are complete due to improved bowel habits and evacuation techniques.  Baseline:  Goal status: MET 11/13/21    LONG  TERM GOALS: Target date: 01/18/2022   Pt to be I with advanced HEP.  Baseline:  Goal status: INITIAL  2.  Pt to demonstrate at least 5/5 bil hip strength for improved pelvic stability and functional squats without leakage.  Baseline:  Goal status: INITIAL  3.  Pt will have 50% less urgency due to bladder retraining and proper voiding mechanics and pelvic floor relaxation techniques.  Baseline:  Goal status: INITIAL  4.  Pt  to demonstrate improved coordination of pelvic floor and breathing mechanics to allow full range of movement of pelvic floor with contraction/ relax/ bulge due to decreased tension and pain at pelvic floor.  Baseline:  Goal status: INITIAL  5.  Pt to report no more than urinary leakage instance per week due to improved symptoms.  Baseline:  Goal status: INITIAL  6.  Pt to demonstrated at least 4/5 strength of pelvic floor without pain and full relaxation without pain at pelvic floor to deceased tension and improved tolerance to vaginal penetration.  Baseline:  Goal status: INITIAL  PLAN: PT FREQUENCY: 1x/week  PT DURATION:  10 sessions  PLANNED INTERVENTIONS: Therapeutic exercises, Therapeutic activity, Neuromuscular re-education, Patient/Family education, Joint mobilization, Aquatic Therapy, Dry Needling, Spinal mobilization, Cryotherapy, Moist heat, Manual lymph drainage, scar mobilization, Taping, Biofeedback, and Manual therapy  PLAN FOR NEXT SESSION: relaxation techniques, manual at spine/hips/abdomen, voiding mechanics, possible internal if pt consents and this is appropriate   Stacy Gardner, PT, DPT 08/15/239:32 AM

## 2021-12-20 ENCOUNTER — Encounter (INDEPENDENT_AMBULATORY_CARE_PROVIDER_SITE_OTHER): Payer: Self-pay | Admitting: Bariatrics

## 2021-12-25 ENCOUNTER — Ambulatory Visit: Payer: Medicaid Other | Admitting: Physical Therapy

## 2021-12-25 DIAGNOSIS — M6281 Muscle weakness (generalized): Secondary | ICD-10-CM

## 2021-12-25 DIAGNOSIS — R293 Abnormal posture: Secondary | ICD-10-CM | POA: Diagnosis not present

## 2021-12-25 DIAGNOSIS — M62838 Other muscle spasm: Secondary | ICD-10-CM | POA: Diagnosis not present

## 2021-12-25 DIAGNOSIS — R279 Unspecified lack of coordination: Secondary | ICD-10-CM

## 2021-12-25 NOTE — Patient Instructions (Signed)

## 2021-12-25 NOTE — Therapy (Signed)
OUTPATIENT PHYSICAL THERAPY FEMALE PELVIC TREATMENT   Patient Name: Reygan Heagle MRN: [id number] DOB:1975-08-01, 46 y.o., female Today's Date: 12/25/2021   PT End of Session - 12/25/21 1304     Visit Number 6    Number of Visits 10   had 8 from PT previous   Date for PT Re-Evaluation 01/18/22    Authorization Type [employer]    Authorization - Visit Number 6    Authorization - Number of Visits 10   27 total (used 8 at other PT)   PT Start Time 1236   pt arrival time   PT Stop Time 1314    PT Time Calculation (min) 38 min    Activity Tolerance Patient tolerated treatment well    Behavior During Therapy WFL for tasks assessed/performed                Past Medical History:  Diagnosis Date   Anal fissure    Colitis    Depression    Edema, lower extremity    Fatty liver    Hypothyroidism    Low blood pressure    PONV (postoperative nausea and vomiting)    SVD (spontaneous vaginal delivery) 06/30/2015   Vitamin D deficiency    Past Surgical History:  Procedure Laterality Date   BREAST CYST ASPIRATION Right 02/13/2018   CYSTOSCOPY N/A 12/30/2019   Procedure: CYSTOSCOPY;  Surgeon: Janyth Contes, MD;  Location: [place];  Service: Gynecology;  Laterality: N/A;   ENDOVENOUS ABLATION SAPHENOUS VEIN W/ LASER Right 10/08/2018   endovenous laser ablation right saphenous vein by Deitra Mayo MD   LAPAROSCOPIC VAGINAL HYSTERECTOMY WITH SALPINGECTOMY Bilateral 12/30/2019   Procedure: LAPAROSCOPIC ASSISTED VAGINAL HYSTERECTOMY WITH SALPINGECTOMY;  Surgeon: Janyth Contes, MD;  Location: [place];  Service: Gynecology;  Laterality: Bilateral;   Patient Active Problem List   Diagnosis Date Noted   Insulin resistance 12/11/2021   Obesity, Current BMI 27.8 12/11/2021   Vitamin D insufficiency 11/05/2021   Right upper quadrant pain 11/02/2021   Class 1 obesity due to excess calories with body mass index (BMI) of 30.0 to 30.9  in adult 08/13/2021   Trapezius muscle strain, right, initial encounter 03/20/2021   Encounter for surveillance of abnormal nevi 07/09/2020   Acquired hypothyroidism 07/07/2020   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   Neck pain 01/18/2020   S/P laparoscopic assisted vaginal hysterectomy (LAVH) 12/30/2019   External thrombosed hemorrhoids 09/11/2019   MDD (major depressive disorder) 08/24/2019   Orthostatic hypotension 08/24/2019   Hypothyroidism 07/06/2018   Family history of congenital anomaly 08/27/2016    PCP: Gladys Damme, MD  REFERRING PROVIDER: McDiarmid, Blane Ohara, MD  REFERRING DIAG:  [phone] (ICD-10-CM) - Dyspareunia due to medical condition in female  THERAPY DIAG:  Muscle weakness (generalized)  Other muscle spasm  Unspecified lack of coordination  Rationale for Evaluation and Treatment Rehabilitation  ONSET DATE: over a year ago  SUBJECTIVE:  SUBJECTIVE STATEMENT: Pt reports "I do the stretches and they really help". Pt does still have tightness feeling that causes pain around the tailbone area.   Fluid intake: Yes: 6-7 glasses of water, coffee once per day     PAIN:  Are you having pain? Yes NPRS scale: 7/10  Pain location:  Rt gluteal and low back  Pain type: tightness Pain description: constant  Aggravating factors: prolonged sitting, biking, prolonged standing, gardening Relieving factors: massage, meds, hot or cold water, HEP  PRECAUTIONS: None  WEIGHT BEARING RESTRICTIONS No  FALLS:  Has patient fallen in last 6 months? No  LIVING ENVIRONMENT: Lives with: lives with their family Lives in: House/apartment   OCCUPATION: not currently working  PLOF: Independent  PATIENT GOALS to have less pain and less leakage  PERTINENT HISTORY:    Colitis, depression, hypothyroidism, vitamin D deficiency, fatty live Sexual abuse: No  BOWEL MOVEMENT Pain with bowel movement: No Type of bowel movement:Type (Bristol Stool Scale) 2-4, Frequency 1-2x per day, and Strain Yes Fully empty rectum: No Leakage: No Pads: No Fiber supplement: Yes: stool softener daily   URINATION Pain with urination: No Fully empty bladder: No Stream: Strong Urgency: Yes:   Frequency: every 2 hours Leakage:  randomly  Pads: No  INTERCOURSE Pain with intercourse: Initial Penetration and During Penetration Ability to have vaginal penetration:  Yes:   Climax: sometimes is able other limited with pain Marinoff Scale: 2/3  PREGNANCY Vaginal deliveries 5 Tearing Yes: did have tearing with the first few and needed stitches, did not have stitches on fourth but had tearing C-section deliveries 0 Currently pregnant No  PROLAPSE None    OBJECTIVE:   DIAGNOSTIC FINDINGS:   PATIENT SURVEYS:    PFIQ-7 62  COGNITION:  Overall cognitive status: Within functional limits for tasks assessed     SENSATION:  Light touch: Deficits pt reports intermittent Rt leg tingling from hip to foot  Proprioception: Appears intact  MUSCLE LENGTH: Bil hamstrings and adductors limited by 25%  LUMBAR SPECIAL TESTS:  SI Compression/distraction test: Positive, FABER test: Positive, and Gaenslen's test: Positive all (+) at Rt for pain at Rt SIJ and glute per pt                POSTURE: rounded shoulders and posterior pelvic tilt  LOWER EXTREMITY ROM:  Bil WFL  LOWER EXTREMITY MMT:  All assessed in sidelying Rt hip abduction 3+/5, extension and adduction 4/5, flexion 4+/5; Lt hip grossly 4/5    PALPATION:   General  TTP at Rt iliopsoas, fascial restrictions throughout abdomen and mild TTP over bladder at suprapubic region, TTP at Rt thoracic and lumbar paraspinals, Rt SIJ, Rt piriformis, coccyx at midline and at tip with muscle tension noted at Rt side                 External Perineal Exam TTP at Rt bulbocavernosus, dryness noted at vulva                              Internal Pelvic Floor TTP at bil bulbocavernosus, Rt pubococcygeus, bil obturator internus and iliococcygeus with trigger points throughout pelvic floor at both deep and superficial layers.   Patient confirms identification and approves PT to assess internal pelvic floor and treatment Yes  PELVIC MMT:   MMT eval  Vaginal 4/5; 9s isometric; 7 reps  Internal Anal Sphincter   External Anal Sphincter   Puborectalis   Diastasis  Recti   (Blank rows = not tested)        TONE: Increased throughout   PROLAPSE: Possible grade 1 anterior wall laxity in hooklying with cough   TODAY'S TREATMENT   12/25/2021:  Manual: pt consented to internal rectal treatment today.  Initially pt reported 7/10 pain at palpation of 12-2 on clock face in direction of coccyx with internal palpation.  Pt does endorse that this is the pain she feels when having pain and does have same radiating pain toward low back. Regardless of pressure of palpation pt reported 7/10 pain and unable to fully relax pelvic floor muscles due pain. Internal treatment halted and focused externally.  Palpation of levator ani at Rt side externally pt reported same pain she felt internally in location but more tolerable with less pain, also pt does endorse this is still her same pain felt at home when having pain. Treatment focused on tissue release work and pt cued for diaphragmatic breathing and relaxation techniques.  2x10 Rt clamps completed with pressure at Rt levator with pt reported tension felt but then release and pain immediately decreased to 4/10. Moist heat used throughout session over clothing at Rt low back and hip and pt reported she felt much better at end of session.   12/18/2021: There ex: Bil piriformis stretch 2x30s Bil hamstring with strap stretch 2x30s Unilateral hip IR stretch 2x30s Moist heat applied with x5  layers of cloth between skin and pad during stretches for pt comfort and relaxation. Pt reports this helps pain levels. Manual: Sacral springing with pt in prone Manual tissue release at rt piriformis and gluteal  External P>A coccyx grade 1-2 mobs    PATIENT EDUCATION:  Education details: [id number] Person educated: Patient Education method: Consulting civil engineer, Demonstration, Tactile cues, Verbal cues, and Handouts Education comprehension: verbalized understanding and returned demonstration   HOME EXERCISE PROGRAM: [id number]  ASSESSMENT:  CLINICAL IMPRESSION: Patient reports she continues to see improvement overall and states she has longer periods with much lower pain levels in general and will still have pain up to 7/10 but less frequently. HEP continues to help pt at home per her today. Session focused on on manual work internally and externally, focusing on externally as this was more tolerable for pt. Pt reported immediately release during treatment with much lower pain levels. Pt tolerated well with external release work at coccyx, Rt glute, Rt low back. Pt would benefit from additional PT to further address deficits.     OBJECTIVE IMPAIRMENTS decreased activity tolerance, decreased coordination, decreased endurance, decreased mobility, decreased strength, increased fascial restrictions, increased muscle spasms, impaired flexibility, impaired tone, improper body mechanics, postural dysfunction, and pain.   ACTIVITY LIMITATIONS carrying, lifting, sitting, standing, squatting, continence, and intercourse  PARTICIPATION LIMITATIONS:  cleaning, laundry, interpersonal relationship, driving, shopping, community activity, and yard work [place], Past/current experiences, Time since onset of injury/illness/exacerbation, and 1 comorbidity: x5 vaginal births with tearing, multiple symptoms  are also affecting patient's functional outcome.   REHAB POTENTIAL: Good  CLINICAL  DECISION MAKING: Evolving/moderate complexity  EVALUATION COMPLEXITY: Moderate   GOALS: Goals reviewed with patient? Yes  SHORT TERM GOALS: Target date: 11/15/2021  Pt to be I with HEP.  Baseline: Goal status: MET 11/13/21   2.  Pt will have 25% less urgency due to bladder retraining and proper voiding mechanics and pelvic floor relaxation techniques.   Baseline:  Goal status: MET 11/13/21   3. Pt will report her BMs are complete due to improved bowel habits  and evacuation techniques.  Baseline:  Goal status: MET 11/13/21    LONG TERM GOALS: Target date: 01/18/2022   Pt to be I with advanced HEP.  Baseline:  Goal status: INITIAL  2.  Pt to demonstrate at least 5/5 bil hip strength for improved pelvic stability and functional squats without leakage.  Baseline:  Goal status: INITIAL  3.  Pt will have 50% less urgency due to bladder retraining and proper voiding mechanics and pelvic floor relaxation techniques.  Baseline:  Goal status: INITIAL  4.  Pt to demonstrate improved coordination of pelvic floor and breathing mechanics to allow full range of movement of pelvic floor with contraction/ relax/ bulge due to decreased tension and pain at pelvic floor.  Baseline:  Goal status: INITIAL  5.  Pt to report no more than urinary leakage instance per week due to improved symptoms.  Baseline:  Goal status: INITIAL  6.  Pt to demonstrated at least 4/5 strength of pelvic floor without pain and full relaxation without pain at pelvic floor to deceased tension and improved tolerance to vaginal penetration.  Baseline:  Goal status: INITIAL  PLAN: PT FREQUENCY: 1x/week  PT DURATION:  10 sessions  PLANNED INTERVENTIONS: Therapeutic exercises, Therapeutic activity, Neuromuscular re-education, Patient/Family education, Joint mobilization, Aquatic Therapy, Dry Needling, Spinal mobilization, Cryotherapy, Moist heat, Manual lymph drainage, scar mobilization, Taping, Biofeedback, and  Manual therapy  PLAN FOR NEXT SESSION: dry needling for pain at Rt coccyx, glute radiating into low rt back.   Stacy Gardner, PT, DPT 08/22/232:46 PM

## 2021-12-30 NOTE — Therapy (Addendum)
OUTPATIENT PHYSICAL THERAPY FEMALE PELVIC TREATMENT   Patient Name: Carol Welch MRN: [id number] DOB:21-Aug-1975, 46 y.o., female Today's Date: 12/31/2021   PT End of Session - 12/31/21 1454     Visit Number 7    Number of Visits 10    Date for PT Re-Evaluation 01/18/22    Authorization Type [employer]    PT Start Time 1452    PT Stop Time 1530    PT Time Calculation (min) 38 min    Activity Tolerance Patient tolerated treatment well    Behavior During Therapy [facility] for tasks assessed/performed                 Past Medical History:  Diagnosis Date   Anal fissure    Colitis    Depression    Edema, lower extremity    Fatty liver    Hypothyroidism    Low blood pressure    PONV (postoperative nausea and vomiting)    SVD (spontaneous vaginal delivery) 06/30/2015   Vitamin D deficiency    Past Surgical History:  Procedure Laterality Date   BREAST CYST ASPIRATION Right 02/13/2018   CYSTOSCOPY N/A 12/30/2019   Procedure: CYSTOSCOPY;  Surgeon: Janyth Contes, MD;  Location: [place];  Service: Gynecology;  Laterality: N/A;   ENDOVENOUS ABLATION SAPHENOUS VEIN W/ LASER Right 10/08/2018   endovenous laser ablation right saphenous vein by Deitra Mayo MD   LAPAROSCOPIC VAGINAL HYSTERECTOMY WITH SALPINGECTOMY Bilateral 12/30/2019   Procedure: LAPAROSCOPIC ASSISTED VAGINAL HYSTERECTOMY WITH SALPINGECTOMY;  Surgeon: Janyth Contes, MD;  Location: [place];  Service: Gynecology;  Laterality: Bilateral;   Patient Active Problem List   Diagnosis Date Noted   Insulin resistance 12/11/2021   Obesity, Current BMI 27.8 12/11/2021   Vitamin D insufficiency 11/05/2021   Right upper quadrant pain 11/02/2021   Class 1 obesity due to excess calories with body mass index (BMI) of 30.0 to 30.9 in adult 08/13/2021   Trapezius muscle strain, right, initial encounter 03/20/2021   Encounter for surveillance of abnormal nevi 07/09/2020    Acquired hypothyroidism 07/07/2020   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   Neck pain 01/18/2020   S/P laparoscopic assisted vaginal hysterectomy (LAVH) 12/30/2019   External thrombosed hemorrhoids 09/11/2019   MDD (major depressive disorder) 08/24/2019   Orthostatic hypotension 08/24/2019   Hypothyroidism 07/06/2018   Family history of congenital anomaly 08/27/2016    PCP: Gladys Damme, MD  REFERRING PROVIDER: McDiarmid, Blane Ohara, MD  REFERRING DIAG:  [phone] (ICD-10-CM) - Dyspareunia due to medical condition in female  THERAPY DIAG:  Muscle weakness (generalized)  Other muscle spasm  Unspecified lack of coordination  Rationale for Evaluation and Treatment Rehabilitation  ONSET DATE: over a year ago  SUBJECTIVE:  SUBJECTIVE STATEMENT: Pt is still having pain and stretching and internal soft tissue all helping.   Fluid intake: Yes: 6-7 glasses of water, coffee once per day     PAIN:  Are you having pain? Yes NPRS scale: 6/10  Pain location:  Rt gluteal and low back  Pain type: tightness Pain description: constant  Aggravating factors: prolonged sitting, biking, prolonged standing, gardening Relieving factors: massage, meds, hot or cold water, HEP  PRECAUTIONS: None  WEIGHT BEARING RESTRICTIONS No  FALLS:  Has patient fallen in last 6 months? No  LIVING ENVIRONMENT: Lives with: lives with their family Lives in: House/apartment   OCCUPATION: not currently working  PLOF: Independent  PATIENT GOALS to have less pain and less leakage  PERTINENT HISTORY:   Colitis, depression, hypothyroidism, vitamin D deficiency, fatty live Sexual abuse: No  BOWEL MOVEMENT Pain with bowel movement: No Type of bowel movement:Type (Bristol Stool Scale) 2-4, Frequency  1-2x per day, and Strain Yes Fully empty rectum: No Leakage: No Pads: No Fiber supplement: Yes: stool softener daily   URINATION Pain with urination: No Fully empty bladder: No Stream: Strong Urgency: Yes:   Frequency: every 2 hours Leakage:  randomly  Pads: No  INTERCOURSE Pain with intercourse: Initial Penetration and During Penetration Ability to have vaginal penetration:  Yes:   Climax: sometimes is able other limited with pain Marinoff Scale: 2/3  PREGNANCY Vaginal deliveries 5 Tearing Yes: did have tearing with the first few and needed stitches, did not have stitches on fourth but had tearing C-section deliveries 0 Currently pregnant No  PROLAPSE None    OBJECTIVE:   DIAGNOSTIC FINDINGS:   PATIENT SURVEYS:    PFIQ-7 62  COGNITION:  Overall cognitive status: Within functional limits for tasks assessed     SENSATION:  Light touch: Deficits pt reports intermittent Rt leg tingling from hip to foot  Proprioception: Appears intact  MUSCLE LENGTH: Bil hamstrings and adductors limited by 25%  LUMBAR SPECIAL TESTS:  SI Compression/distraction test: Positive, FABER test: Positive, and Gaenslen's test: Positive all (+) at Rt for pain at Rt SIJ and glute per pt                POSTURE: rounded shoulders and posterior pelvic tilt  LOWER EXTREMITY ROM:  Bil WFL  LOWER EXTREMITY MMT:  All assessed in sidelying Rt hip abduction 3+/5, extension and adduction 4/5, flexion 4+/5; Lt hip grossly 4/5    PALPATION:   General  TTP at Rt iliopsoas, fascial restrictions throughout abdomen and mild TTP over bladder at suprapubic region, TTP at Rt thoracic and lumbar paraspinals, Rt SIJ, Rt piriformis, coccyx at midline and at tip with muscle tension noted at Rt side                External Perineal Exam TTP at Rt bulbocavernosus, dryness noted at vulva                              Internal Pelvic Floor TTP at bil bulbocavernosus, Rt pubococcygeus, bil obturator  internus and iliococcygeus with trigger points throughout pelvic floor at both deep and superficial layers.   Patient confirms identification and approves PT to assess internal pelvic floor and treatment Yes  PELVIC MMT:   MMT eval  Vaginal 4/5; 9s isometric; 7 reps  Internal Anal Sphincter   External Anal Sphincter   Puborectalis   Diastasis Recti   (Blank rows = not tested)  TONE: Increased throughout   PROLAPSE: Possible grade 1 anterior wall laxity in hooklying with cough   TODAY'S TREATMENT  12/31/21  Manual: Patient confirms identification and approves physical therapist to perform internal soft tissue work  Internal to bil coccygeus performed vaginally with LE movements - more release on the left side; right side more inflamed and sore Trigger Point Dry-Needling  Treatment instructions: Expect mild to moderate muscle soreness. S/S of pneumothorax if dry needled over a lung field, and to seek immediate medical attention should they occur. Patient verbalized understanding of these instructions and education.  Patient Consent Given: Yes Education handout provided: Yes Muscles treated: lumbar multifidi and bil glute med, max, piriformis Electrical stimulation performed: No Parameters: N/A Treatment response/outcome: increased soft tissue length and decreased tenderness to palpation  12/25/2021:  Manual: pt consented to internal rectal treatment today.  Initially pt reported 7/10 pain at palpation of 12-2 on clock face in direction of coccyx with internal palpation.  Pt does endorse that this is the pain she feels when having pain and does have same radiating pain toward low back. Regardless of pressure of palpation pt reported 7/10 pain and unable to fully relax pelvic floor muscles due pain. Internal treatment halted and focused externally.  Palpation of levator ani at Rt side externally pt reported same pain she felt internally in location but more tolerable with less  pain, also pt does endorse this is still her same pain felt at home when having pain. Treatment focused on tissue release work and pt cued for diaphragmatic breathing and relaxation techniques.  2x10 Rt clamps completed with pressure at Rt levator with pt reported tension felt but then release and pain immediately decreased to 4/10. Moist heat used throughout session over clothing at Rt low back and hip and pt reported she felt much better at end of session.   12/18/2021: There ex: Bil piriformis stretch 2x30s Bil hamstring with strap stretch 2x30s Unilateral hip IR stretch 2x30s Moist heat applied with x5 layers of cloth between skin and pad during stretches for pt comfort and relaxation. Pt reports this helps pain levels. Manual: Sacral springing with pt in prone Manual tissue release at rt piriformis and gluteal  External P>A coccyx grade 1-2 mobs    PATIENT EDUCATION:  Education details: [id number] Person educated: Patient Education method: Explanation, Demonstration, Tactile cues, Verbal cues, and Handouts Education comprehension: verbalized understanding and returned demonstration   HOME EXERCISE PROGRAM: [id number]  ASSESSMENT:  CLINICAL IMPRESSION: Patient did well with new treatment today.  Pt started at 6/10 and at the end the session reports no pain just muscle soreness.  Pt has improved soft tissue length with both manual and dry needling.  Pt was advised to continue with stretches in order to maintain improved soft tissue achieved during today's session. Pt would benefit from additional PT to further address deficits.     OBJECTIVE IMPAIRMENTS decreased activity tolerance, decreased coordination, decreased endurance, decreased mobility, decreased strength, increased fascial restrictions, increased muscle spasms, impaired flexibility, impaired tone, improper body mechanics, postural dysfunction, and pain.   ACTIVITY LIMITATIONS carrying, lifting, sitting, standing, squatting,  continence, and intercourse  PARTICIPATION LIMITATIONS:  cleaning, laundry, interpersonal relationship, driving, shopping, community activity, and yard work [place], Past/current experiences, Time since onset of injury/illness/exacerbation, and 1 comorbidity: x5 vaginal births with tearing, multiple symptoms  are also affecting patient's functional outcome.   REHAB POTENTIAL: Good  CLINICAL DECISION MAKING: Evolving/moderate complexity  EVALUATION COMPLEXITY: Moderate   GOALS: Goals reviewed  with patient? Yes  SHORT TERM GOALS: Target date: 11/15/2021  Pt to be I with HEP.  Baseline: Goal status: MET 11/13/21   2.  Pt will have 25% less urgency due to bladder retraining and proper voiding mechanics and pelvic floor relaxation techniques.   Baseline:  Goal status: MET 11/13/21   3. Pt will report her BMs are complete due to improved bowel habits and evacuation techniques.  Baseline:  Goal status: MET 11/13/21    LONG TERM GOALS: Target date: 01/18/2022   Pt to be I with advanced HEP.  Baseline:  Goal status: INITIAL  2.  Pt to demonstrate at least 5/5 bil hip strength for improved pelvic stability and functional squats without leakage.  Baseline:  Goal status: INITIAL  3.  Pt will have 50% less urgency due to bladder retraining and proper voiding mechanics and pelvic floor relaxation techniques.  Baseline:  Goal status: INITIAL  4.  Pt to demonstrate improved coordination of pelvic floor and breathing mechanics to allow full range of movement of pelvic floor with contraction/ relax/ bulge due to decreased tension and pain at pelvic floor.  Baseline:  Goal status: INITIAL  5.  Pt to report no more than urinary leakage instance per week due to improved symptoms.  Baseline:  Goal status: INITIAL  6.  Pt to demonstrated at least 4/5 strength of pelvic floor without pain and full relaxation without pain at pelvic floor to deceased tension and improved  tolerance to vaginal penetration.  Baseline:  Goal status: INITIAL  PLAN: PT FREQUENCY: 1x/week  PT DURATION:  10 sessions  PLANNED INTERVENTIONS: Therapeutic exercises, Therapeutic activity, Neuromuscular re-education, Patient/Family education, Joint mobilization, Aquatic Therapy, Dry Needling, Spinal mobilization, Cryotherapy, Moist heat, Manual lymph drainage, scar mobilization, Taping, Biofeedback, and Manual therapy  PLAN FOR NEXT SESSION: f/u on dry needling for pain at Rt coccyx, glute radiating into low rt back.   Gustavus Bryant, PT, DPT 08/28/233:24 PM   PHYSICAL THERAPY DISCHARGE SUMMARY  Visits from Start of Care: 7  Current functional level related to goals / functional outcomes: See above goals   Remaining deficits: See above details   Education / Equipment: HEP   Patient agrees to discharge. Patient goals were not met. Patient is being discharged due to not returning since the last visit.  Gustavus Bryant, PT 02/15/22 9:12 AM

## 2021-12-31 ENCOUNTER — Encounter: Payer: Self-pay | Admitting: Physical Therapy

## 2021-12-31 ENCOUNTER — Ambulatory Visit: Payer: Medicaid Other | Admitting: Physical Therapy

## 2021-12-31 DIAGNOSIS — M62838 Other muscle spasm: Secondary | ICD-10-CM | POA: Diagnosis not present

## 2021-12-31 DIAGNOSIS — R279 Unspecified lack of coordination: Secondary | ICD-10-CM

## 2021-12-31 DIAGNOSIS — R293 Abnormal posture: Secondary | ICD-10-CM | POA: Diagnosis not present

## 2021-12-31 DIAGNOSIS — M6281 Muscle weakness (generalized): Secondary | ICD-10-CM | POA: Diagnosis not present

## 2022-01-01 ENCOUNTER — Ambulatory Visit: Payer: Medicaid Other | Admitting: Physical Therapy

## 2022-01-02 DIAGNOSIS — F331 Major depressive disorder, recurrent, moderate: Secondary | ICD-10-CM | POA: Diagnosis not present

## 2022-01-04 DIAGNOSIS — Z419 Encounter for procedure for purposes other than remedying health state, unspecified: Secondary | ICD-10-CM | POA: Diagnosis not present

## 2022-01-07 ENCOUNTER — Other Ambulatory Visit: Payer: Self-pay | Admitting: Internal Medicine

## 2022-01-07 ENCOUNTER — Other Ambulatory Visit (INDEPENDENT_AMBULATORY_CARE_PROVIDER_SITE_OTHER): Payer: Self-pay | Admitting: Bariatrics

## 2022-01-07 DIAGNOSIS — J060 Acute laryngopharyngitis: Secondary | ICD-10-CM | POA: Diagnosis not present

## 2022-01-07 DIAGNOSIS — J029 Acute pharyngitis, unspecified: Secondary | ICD-10-CM | POA: Diagnosis not present

## 2022-01-07 DIAGNOSIS — E8881 Metabolic syndrome: Secondary | ICD-10-CM

## 2022-01-08 ENCOUNTER — Ambulatory Visit (INDEPENDENT_AMBULATORY_CARE_PROVIDER_SITE_OTHER): Payer: Medicaid Other | Admitting: Bariatrics

## 2022-01-08 ENCOUNTER — Other Ambulatory Visit: Payer: Self-pay | Admitting: Internal Medicine

## 2022-01-09 DIAGNOSIS — F331 Major depressive disorder, recurrent, moderate: Secondary | ICD-10-CM | POA: Diagnosis not present

## 2022-01-10 ENCOUNTER — Ambulatory Visit: Payer: Medicaid Other | Admitting: Physical Therapy

## 2022-01-14 ENCOUNTER — Encounter (INDEPENDENT_AMBULATORY_CARE_PROVIDER_SITE_OTHER): Payer: Self-pay | Admitting: Nurse Practitioner

## 2022-01-14 ENCOUNTER — Ambulatory Visit (INDEPENDENT_AMBULATORY_CARE_PROVIDER_SITE_OTHER): Payer: Medicaid Other | Admitting: Nurse Practitioner

## 2022-01-14 ENCOUNTER — Ambulatory Visit: Payer: Medicaid Other | Attending: Family Medicine | Admitting: Physical Therapy

## 2022-01-14 VITALS — BP 101/68 | HR 57 | Temp 98.8°F | Ht 62.0 in | Wt 148.0 lb

## 2022-01-14 DIAGNOSIS — Z6827 Body mass index (BMI) 27.0-27.9, adult: Secondary | ICD-10-CM

## 2022-01-14 DIAGNOSIS — E8881 Metabolic syndrome: Secondary | ICD-10-CM | POA: Diagnosis not present

## 2022-01-14 DIAGNOSIS — E6609 Other obesity due to excess calories: Secondary | ICD-10-CM

## 2022-01-14 DIAGNOSIS — E559 Vitamin D deficiency, unspecified: Secondary | ICD-10-CM

## 2022-01-14 DIAGNOSIS — R293 Abnormal posture: Secondary | ICD-10-CM | POA: Insufficient documentation

## 2022-01-14 DIAGNOSIS — M62838 Other muscle spasm: Secondary | ICD-10-CM | POA: Insufficient documentation

## 2022-01-14 DIAGNOSIS — M6281 Muscle weakness (generalized): Secondary | ICD-10-CM | POA: Insufficient documentation

## 2022-01-14 DIAGNOSIS — E669 Obesity, unspecified: Secondary | ICD-10-CM | POA: Diagnosis not present

## 2022-01-14 DIAGNOSIS — R279 Unspecified lack of coordination: Secondary | ICD-10-CM | POA: Insufficient documentation

## 2022-01-14 MED ORDER — VITAMIN D (ERGOCALCIFEROL) 1.25 MG (50000 UNIT) PO CAPS: 50000.0000 [IU] | ORAL_CAPSULE | ORAL | 0 refills | Status: DC

## 2022-01-16 NOTE — Progress Notes (Signed)
Chief Complaint:   OBESITY Carol Welch is here to discuss her progress with her obesity treatment plan along with follow-up of her obesity related diagnoses. Carol Welch is on the Category 1 Plan and states she is following her eating plan approximately 98% of the time. Carol Welch states she is walking, running, and some resistance training 30 minutes 6 times per week.  Today's visit was #: 10 Starting weight: 167 lbs Starting date: 08/08/2021 Today's weight: 148 lbs Today's date: 01/14/2022 Total lbs lost to date: 19 Total lbs lost since last in-office visit: 4  Interim History: Carol Welch has done well with weight loss since her last visit. She is doing well with meal planning, limiting fried foods and is baking more. Her calorie intake is 1000 and protein is 48 grams daily. She is drinking water daily. She has reached her goal weight and would like to maintain her current weight.  Subjective:   1. Insulin resistance Carol Welch is taking Metformin 500 mg 1/2 tablet, and she denies side effects on current dose. She reduced her dose due to side effects of headache and GI upset. She denies polyphagia and cravings.   2. Vitamin D insufficiency She is currently taking prescription vitamin D 50,000 IU each week. She denies nausea, vomiting or muscle weakness.  Assessment/Plan:   1. Insulin resistance Carol Welch will continue Metformin 500 mg 1/2 tablet daily.   2. Vitamin D insufficiency Low Vitamin D level contributes to fatigue and are associated with obesity, breast, and colon cancer. Carol Welch agrees to continue to take prescription Vitamin D 50,000 IU every week, and will refill for 1 month. She will follow-up for routine testing of Vitamin D, at least 2-3 times per year to avoid over-replacement.  - Vitamin D, Ergocalciferol, (DRISDOL) 1.25 MG (50000 UNIT) CAPS capsule; Take 1 capsule (50,000 Units total) by mouth every 7 (seven) days.  Dispense: 5 capsule; Refill: 0  3. Obesity, Current BMI  27.2 Carol Welch is currently in the action stage of change. As such, her goal is to maintain weight for now. She has agreed to keeping a food journal and adhering to recommended goals of 1200 calories and 75+ grams of protein.   Exercise goals: As is.  Behavioral modification strategies: increasing lean protein intake, increasing vegetables, increasing water intake, and planning for success.  Carol Welch has agreed to follow-up with our clinic in 4 weeks. She was informed of the importance of frequent follow-up visits to maximize her success with intensive lifestyle modifications for her multiple health conditions.   Carol Welch was informed we would discuss her lab results at her next visit unless there is a critical issue that needs to be addressed sooner. Carol Welch agreed to keep her next visit at the agreed upon time to discuss these results.  Objective:   Blood pressure 101/68, pulse (!) 57, temperature 98.8 F (37.1 C), height 5\' 2"  (1.575 m), weight 148 lb (67.1 kg), last menstrual period 12/19/2019, SpO2 99 %. Body mass index is 27.07 kg/m.  General: Cooperative, alert, well developed, in no acute distress. HEENT: Conjunctivae and lids unremarkable. Cardiovascular: Regular rhythm.  Lungs: Normal work of breathing. Neurologic: No focal deficits.   Lab Results  Component Value Date   CREATININE 0.70 11/02/2021   BUN 18 11/02/2021   NA 139 11/02/2021   K 4.3 11/02/2021   CL 105 11/02/2021   CO2 24 11/02/2021   Lab Results  Component Value Date   ALT 13 11/02/2021   AST 31 11/02/2021   ALKPHOS 46  11/02/2021   BILITOT 0.2 11/02/2021   Lab Results  Component Value Date   HGBA1C 5.6 08/08/2021   Lab Results  Component Value Date   INSULIN 5.7 08/08/2021   Lab Results  Component Value Date   TSH 1.35 03/20/2021   No results found for: "CHOL", "HDL", "LDLCALC", "LDLDIRECT", "TRIG", "CHOLHDL" Lab Results  Component Value Date   VD25OH 37.3 08/08/2021   VD25OH 41.65 12/05/2020    Lab Results  Component Value Date   WBC 5.8 11/01/2021   HGB 12.9 11/01/2021   HCT 38.1 11/01/2021   MCV 89 11/01/2021   PLT 223 11/01/2021   No results found for: "IRON", "TIBC", "FERRITIN"  Attestation Statements:   Reviewed by clinician on day of visit: allergies, medications, problem list, medical history, surgical history, family history, social history, and previous encounter notes.  I, Janeece Riggers, am acting as Energy manager for [employer], FNP-C.  I have reviewed the above documentation for accuracy and completeness, and I agree with the above. Irene Limbo, FNP

## 2022-01-17 DIAGNOSIS — F331 Major depressive disorder, recurrent, moderate: Secondary | ICD-10-CM | POA: Diagnosis not present

## 2022-01-30 DIAGNOSIS — F331 Major depressive disorder, recurrent, moderate: Secondary | ICD-10-CM | POA: Diagnosis not present

## 2022-02-03 DIAGNOSIS — Z419 Encounter for procedure for purposes other than remedying health state, unspecified: Secondary | ICD-10-CM | POA: Diagnosis not present

## 2022-02-07 DIAGNOSIS — R300 Dysuria: Secondary | ICD-10-CM | POA: Diagnosis not present

## 2022-02-07 DIAGNOSIS — B3731 Acute candidiasis of vulva and vagina: Secondary | ICD-10-CM | POA: Diagnosis not present

## 2022-02-07 DIAGNOSIS — N898 Other specified noninflammatory disorders of vagina: Secondary | ICD-10-CM | POA: Diagnosis not present

## 2022-02-11 ENCOUNTER — Ambulatory Visit (INDEPENDENT_AMBULATORY_CARE_PROVIDER_SITE_OTHER): Payer: Medicaid Other | Admitting: Family Medicine

## 2022-02-11 ENCOUNTER — Encounter (INDEPENDENT_AMBULATORY_CARE_PROVIDER_SITE_OTHER): Payer: Self-pay | Admitting: Internal Medicine

## 2022-02-11 ENCOUNTER — Ambulatory Visit (INDEPENDENT_AMBULATORY_CARE_PROVIDER_SITE_OTHER): Payer: Medicaid Other | Admitting: Internal Medicine

## 2022-02-11 VITALS — BP 105/72 | HR 60 | Temp 98.6°F | Ht 62.0 in | Wt 150.0 lb

## 2022-02-11 DIAGNOSIS — Z6827 Body mass index (BMI) 27.0-27.9, adult: Secondary | ICD-10-CM | POA: Diagnosis not present

## 2022-02-11 DIAGNOSIS — E88819 Insulin resistance, unspecified: Secondary | ICD-10-CM | POA: Diagnosis not present

## 2022-02-11 DIAGNOSIS — E559 Vitamin D deficiency, unspecified: Secondary | ICD-10-CM

## 2022-02-11 DIAGNOSIS — E669 Obesity, unspecified: Secondary | ICD-10-CM | POA: Diagnosis not present

## 2022-02-11 MED ORDER — METFORMIN HCL 500 MG PO TABS: 500.0000 mg | ORAL_TABLET | Freq: Every day | ORAL | 0 refills | Status: DC

## 2022-02-11 MED ORDER — ERGOCALCIFEROL 1.25 MG (50000 UT) PO CAPS: 50000.0000 [IU] | ORAL_CAPSULE | ORAL | 3 refills | Status: DC

## 2022-02-12 LAB — VITAMIN D 25 HYDROXY (VIT D DEFICIENCY, FRACTURES): Vit D, 25-Hydroxy: 48 ng/mL (ref 30.0–100.0)

## 2022-02-13 DIAGNOSIS — F331 Major depressive disorder, recurrent, moderate: Secondary | ICD-10-CM | POA: Diagnosis not present

## 2022-02-14 DIAGNOSIS — H40013 Open angle with borderline findings, low risk, bilateral: Secondary | ICD-10-CM | POA: Diagnosis not present

## 2022-02-14 DIAGNOSIS — H524 Presbyopia: Secondary | ICD-10-CM | POA: Diagnosis not present

## 2022-02-14 DIAGNOSIS — H16223 Keratoconjunctivitis sicca, not specified as Sjogren's, bilateral: Secondary | ICD-10-CM | POA: Diagnosis not present

## 2022-02-19 NOTE — Progress Notes (Signed)
Chief Complaint:   OBESITY Carol Welch is here to discuss her progress with her obesity treatment plan along with follow-up of her obesity related diagnoses. Carol Welch is on keeping a food journal and adhering to recommended goals of 1200 calories and 75+ grams of protein daily and states she is following her eating plan approximately 98% of the time. Carol Welch states she is walking and lifting weights for 30 minutes 5 times per week.  Today's visit was #: 11 Starting weight: 167 lbs Starting date: 08/08/2021 Today's weight: 150 Today's date: 02/11/2022 Total lbs lost to date: 17 Total lbs lost since last in-office visit: 0  Interim History: Carol Welch presents today doing well. She reports gaining some weight due to increased calorie allowance to maintain weight. She also notes may not be hitting her protein target. She reports adequate sleep, appetite control, and regular physical activity. She is tolerating lower dose of metformin.   Subjective:   1. Insulin resistance Carol Welch notes improved inadequate cravings with her meal plan and metformin. She is no longer having side effects on 1/2 tablet of her medication.   2. Vitamin D insufficiency Carol Welch is asymptomatic, and is associated with adiposity, leptin resistance. She is on Vitamin D supplementation. Last level was 37.3 in 4/23.   Assessment/Plan:   1. Insulin resistance Carol Welch will increase her protein intake, and continue her current meal plan and metformin. We will refill metformin for 1 month.   - metFORMIN (GLUCOPHAGE) 500 MG tablet; Take 1 tablet (500 mg total) by mouth daily with breakfast.  Dispense: 30 tablet; Refill: 0  2. Vitamin D insufficiency We will check labs today. Carol Welch will start Vitamin D 50,000 IU once week, and will adjust as needed for a goal of 50-60%.   - ergocalciferol (VITAMIN D2) 1.25 MG (50000 UT) capsule; Take 1 capsule (50,000 Units total) by mouth once a week.  Dispense: 4 capsule; Refill: 3 -  VITAMIN D 25 Hydroxy (Vit-D Deficiency, Fractures)  3. Obesity with current BMI of 27.4 Carol Welch is currently in the action stage of change. As such, her goal is to continue with weight loss efforts. She has agreed to the Category 2 Plan.   Exercise goals: As is.   Behavioral modification strategies: increasing lean protein intake and increasing water intake.  Carol Welch has agreed to follow-up with our clinic in 4 weeks. She was informed of the importance of frequent follow-up visits to maximize her success with intensive lifestyle modifications for her multiple health conditions.   Objective:   Blood pressure 105/72, pulse 60, temperature 98.6 F (37 C), height 5\' 2"  (1.575 m), weight 150 lb (68 kg), last menstrual period 12/19/2019, SpO2 100 %. Body mass index is 27.44 kg/m.  General: Cooperative, alert, well developed, in no acute distress. HEENT: Conjunctivae and lids unremarkable. Cardiovascular: Regular rhythm.  Lungs: Normal work of breathing. Neurologic: No focal deficits.   Lab Results  Component Value Date   CREATININE 0.70 11/02/2021   BUN 18 11/02/2021   NA 139 11/02/2021   K 4.3 11/02/2021   CL 105 11/02/2021   CO2 24 11/02/2021   Lab Results  Component Value Date   ALT 13 11/02/2021   AST 31 11/02/2021   ALKPHOS 46 11/02/2021   BILITOT 0.2 11/02/2021   Lab Results  Component Value Date   HGBA1C 5.6 08/08/2021   Lab Results  Component Value Date   INSULIN 5.7 08/08/2021   Lab Results  Component Value Date   TSH 1.35 03/20/2021  No results found for: "CHOL", "HDL", "LDLCALC", "LDLDIRECT", "TRIG", "CHOLHDL" Lab Results  Component Value Date   VD25OH 48.0 02/11/2022   VD25OH 37.3 08/08/2021   VD25OH 41.65 12/05/2020   Lab Results  Component Value Date   WBC 5.8 11/01/2021   HGB 12.9 11/01/2021   HCT 38.1 11/01/2021   MCV 89 11/01/2021   PLT 223 11/01/2021   No results found for: "IRON", "TIBC", "FERRITIN"  Attestation Statements:    Reviewed by clinician on day of visit: allergies, medications, problem list, medical history, surgical history, family history, social history, and previous encounter notes.   Trude Mcburney, am acting as transcriptionist for Worthy Rancher, MD.  I have reviewed the above documentation for accuracy and completeness, and I agree with the above. -Worthy Rancher, MD

## 2022-03-01 DIAGNOSIS — F331 Major depressive disorder, recurrent, moderate: Secondary | ICD-10-CM | POA: Diagnosis not present

## 2022-03-04 ENCOUNTER — Other Ambulatory Visit: Payer: Self-pay | Admitting: Internal Medicine

## 2022-03-04 DIAGNOSIS — N898 Other specified noninflammatory disorders of vagina: Secondary | ICD-10-CM | POA: Diagnosis not present

## 2022-03-04 DIAGNOSIS — R300 Dysuria: Secondary | ICD-10-CM | POA: Diagnosis not present

## 2022-03-04 DIAGNOSIS — B3731 Acute candidiasis of vulva and vagina: Secondary | ICD-10-CM | POA: Diagnosis not present

## 2022-03-06 DIAGNOSIS — Z419 Encounter for procedure for purposes other than remedying health state, unspecified: Secondary | ICD-10-CM | POA: Diagnosis not present

## 2022-03-06 DIAGNOSIS — F331 Major depressive disorder, recurrent, moderate: Secondary | ICD-10-CM | POA: Diagnosis not present

## 2022-03-07 ENCOUNTER — Ambulatory Visit (INDEPENDENT_AMBULATORY_CARE_PROVIDER_SITE_OTHER): Payer: Medicaid Other | Admitting: Student

## 2022-03-07 VITALS — BP 114/76 | HR 54 | Wt 151.5 lb

## 2022-03-07 DIAGNOSIS — R1011 Right upper quadrant pain: Secondary | ICD-10-CM

## 2022-03-07 DIAGNOSIS — R110 Nausea: Secondary | ICD-10-CM | POA: Diagnosis not present

## 2022-03-07 LAB — POCT URINALYSIS DIP (MANUAL ENTRY)
Bilirubin, UA: NEGATIVE
Blood, UA: NEGATIVE
Glucose, UA: NEGATIVE mg/dL
Ketones, POC UA: NEGATIVE mg/dL
Leukocytes, UA: NEGATIVE
Nitrite, UA: NEGATIVE
Protein Ur, POC: NEGATIVE mg/dL
Spec Grav, UA: 1.01 (ref 1.010–1.025)
Urobilinogen, UA: 0.2 E.U./dL
pH, UA: 6 (ref 5.0–8.0)

## 2022-03-07 MED ORDER — ONDANSETRON HCL 4 MG PO TABS: 4.0000 mg | ORAL_TABLET | Freq: Three times a day (TID) | ORAL | 0 refills | Status: DC | PRN

## 2022-03-07 NOTE — Patient Instructions (Addendum)
It was great to see you! Thank you for allowing me to participate in your care!   I recommend that you always bring your medications to each appointment as this makes it easy to ensure we are on the correct medications and helps [place] not miss when refills are needed.  Our plans for today:  -Take Zofran for nausea as needed -Continue Tylenol and ibuprofen for pain -Recommend follow-up in 2 weeks -If your pain worsens and you are vomiting/having fevers/unable to keep fluids down please go to the ED for evaluation  We are checking some labs today, I will call you if they are abnormal will send you a MyChart message or a letter if they are normal.  If you do not hear about your labs in the next 2 weeks please let us know.  Take care and seek immediate care sooner if you develop any concerns. Please remember to show up 15 minutes before your scheduled appointment time!  Leslie Dales, DO [facility]

## 2022-03-07 NOTE — Assessment & Plan Note (Signed)
-   ODT Zofran as needed

## 2022-03-07 NOTE — Assessment & Plan Note (Addendum)
History of recurrent right upper quadrant pain, previous testing and imaging unremarkable-do not believe repeating will be useful at this time.  Due to patient reported pain severity, we will order labs.  Physical exam suspicious of gallbladder/pancreas pathology. - CMP - Hepatitis C screen - Lipase - UA - We can consider repeat imaging if lab work is concerning for acute GI pathology

## 2022-03-07 NOTE — Progress Notes (Signed)
    SUBJECTIVE:   CHIEF COMPLAINT / HPI: Right upper quadrant pain  RUQ Pain started 3 days ago after waking, 10/10 pain with nausea and headache.  Headache improves when abdominal pain improves.  She was unable to get up and do daily tasks when it started, pain is 6/10 today and she is more functional.  Pain improves with Tylenol and ibuprofen.  She is not taking medication for nausea.  No vomiting/diarrhea, fever.  No change in bowel habits.  No dysuria, increased frequency or urgency.  She has reduced appetite, only eating soup and toast. She has lost 17 lbs over 6 months, which she is actively trying to do after being diagnosed with fatty liver disease.  She is followed closely by weight loss clinic.  On chart review patient has had 2 right upper quadrant ultrasounds since November 2022-no gallbladder pathology.  Nuclear medicine hepatobiliary imaging in July 2023 which showed normal ejection fraction of gallbladder.  PERTINENT  PMH / PSH: Obesity, MDD, hypothyroid.  Hysterectomy 3 years ago.  OBJECTIVE:   BP 114/76   Pulse (!) 54   Wt 151 lb 8 oz (68.7 kg)   LMP 12/19/2019   SpO2 100%   BMI 27.71 kg/m   General: Not in acute distress, pleasant Cardio: RRR, no MRG Respiratory: CTAB, normal work of breathing on room air GI: Tenderness to palpation right upper quadrant, Murphy sign positive.  Not distended, no rash over abdomen.  Bowel sounds present.  ASSESSMENT/PLAN:   Nausea - ODT Zofran as needed  Right upper quadrant pain History of recurrent right upper quadrant pain, previous testing and imaging unremarkable-do not believe repeating will be useful at this time.  Due to patient reported pain severity today, we will repeat some labs.  Physical exam suspicious of gallbladder pathology. - CMP - Hepatitis C screen - Lipase - UA - We can consider repeat imaging if lab work is concerning for acute GI pathology   Follow-up recommendations for provider: 1.)  Can consider  repeat imaging, would defer if lab work is normal 2.)  If worsened or new symptoms, consider GI consult 3.)  Patient does not endorse heartburn, may be taking famotidine-reassess at next visit and consider starting PPI 4.)  Schedule patient for health maintenance exam  Leslie Dales, [place]

## 2022-03-08 LAB — COMPREHENSIVE METABOLIC PANEL
ALT: 8 IU/L (ref 0–32)
AST: 13 IU/L (ref 0–40)
Albumin/Globulin Ratio: 1.7 (ref 1.2–2.2)
Albumin: 4.2 g/dL (ref 3.9–4.9)
Alkaline Phosphatase: 49 IU/L (ref 44–121)
BUN/Creatinine Ratio: 15 (ref 9–23)
BUN: 10 mg/dL (ref 6–24)
Bilirubin Total: 0.3 mg/dL (ref 0.0–1.2)
CO2: 24 mmol/L (ref 20–29)
Calcium: 8.8 mg/dL (ref 8.7–10.2)
Chloride: 106 mmol/L (ref 96–106)
Creatinine, Ser: 0.65 mg/dL (ref 0.57–1.00)
Globulin, Total: 2.5 g/dL (ref 1.5–4.5)
Glucose: 84 mg/dL (ref 70–99)
Potassium: 3.9 mmol/L (ref 3.5–5.2)
Sodium: 140 mmol/L (ref 134–144)
Total Protein: 6.7 g/dL (ref 6.0–8.5)
eGFR: 111 mL/min/{1.73_m2} (ref >59)

## 2022-03-08 LAB — LIPASE: Lipase: 31 U/L (ref 14–72)

## 2022-03-08 LAB — HEPATITIS C ANTIBODY: Hep C Virus Ab: NONREACTIVE

## 2022-03-11 ENCOUNTER — Ambulatory Visit (INDEPENDENT_AMBULATORY_CARE_PROVIDER_SITE_OTHER): Payer: Medicaid Other | Admitting: Family Medicine

## 2022-03-11 ENCOUNTER — Encounter (INDEPENDENT_AMBULATORY_CARE_PROVIDER_SITE_OTHER): Payer: Self-pay | Admitting: Family Medicine

## 2022-03-11 VITALS — BP 110/65 | HR 66 | Temp 98.1°F | Ht 62.0 in | Wt 146.0 lb

## 2022-03-11 DIAGNOSIS — E88819 Insulin resistance, unspecified: Secondary | ICD-10-CM | POA: Diagnosis not present

## 2022-03-11 DIAGNOSIS — E559 Vitamin D deficiency, unspecified: Secondary | ICD-10-CM | POA: Diagnosis not present

## 2022-03-11 DIAGNOSIS — Z6826 Body mass index (BMI) 26.0-26.9, adult: Secondary | ICD-10-CM

## 2022-03-11 DIAGNOSIS — E669 Obesity, unspecified: Secondary | ICD-10-CM

## 2022-03-11 DIAGNOSIS — E6609 Other obesity due to excess calories: Secondary | ICD-10-CM

## 2022-03-11 MED ORDER — ERGOCALCIFEROL 1.25 MG (50000 UT) PO CAPS: 50000.0000 [IU] | ORAL_CAPSULE | ORAL | 3 refills | Status: DC

## 2022-03-13 ENCOUNTER — Other Ambulatory Visit (INDEPENDENT_AMBULATORY_CARE_PROVIDER_SITE_OTHER): Payer: Self-pay

## 2022-03-13 ENCOUNTER — Other Ambulatory Visit (INDEPENDENT_AMBULATORY_CARE_PROVIDER_SITE_OTHER): Payer: Self-pay | Admitting: Internal Medicine

## 2022-03-13 DIAGNOSIS — E88819 Insulin resistance, unspecified: Secondary | ICD-10-CM

## 2022-03-13 MED ORDER — METFORMIN HCL 500 MG PO TABS: 500.0000 mg | ORAL_TABLET | Freq: Every day | ORAL | 0 refills | Status: DC

## 2022-03-17 ENCOUNTER — Ambulatory Visit (HOSPITAL_COMMUNITY)
Admission: EM | Admit: 2022-03-17 | Discharge: 2022-03-17 | Disposition: A | Discharge status: Home / Self Care | Payer: Medicaid Other | Attending: Physician Assistant | Admitting: Physician Assistant

## 2022-03-17 ENCOUNTER — Encounter (HOSPITAL_COMMUNITY): Payer: Self-pay

## 2022-03-17 DIAGNOSIS — L03211 Cellulitis of face: Secondary | ICD-10-CM

## 2022-03-17 DIAGNOSIS — R519 Headache, unspecified: Secondary | ICD-10-CM | POA: Diagnosis not present

## 2022-03-17 MED ORDER — SULFAMETHOXAZOLE-TRIMETHOPRIM 800-160 MG PO TABS: 1.0000 | ORAL_TABLET | Freq: Two times a day (BID) | ORAL | 0 refills | Status: AC

## 2022-03-17 NOTE — ED Provider Notes (Signed)
[facility]    CSN: [id number] Arrival date & time: 03/17/22  1752      History   Chief Complaint Chief Complaint  Patient presents with   Facial Swelling   Eye Problem    HPI Carol Welch is a 46 y.o. female.   46 year old female with a cyst on the nasal bridge next to the eye.  Patient indicates for the past 3 days she has had a cyst developing on the nasal bridge on the inside of the left eye.  She relates that it has become tender, red, swollen.  Patient indicates that she is getting some swelling underneath the left eye at the upper cheek area with mild redness as well.  She indicates this area is tender and sore also.  Patient also indicates that she is having some mild left eye blurriness which is intermittent due to the cyst pressure.  Patient indicates that she is using warm compresses and taking Aleve with minimal benefit.  She indicates she is not having any fever or chills.   Eye Problem   Past Medical History:  Diagnosis Date   Anal fissure    Colitis    Depression    Edema, lower extremity    Fatty liver    Hypothyroidism    Low blood pressure    PONV (postoperative nausea and vomiting)    SVD (spontaneous vaginal delivery) 06/30/2015   Vitamin D deficiency     Patient Active Problem List   Diagnosis Date Noted   Nausea 03/07/2022   Obesity without serious comorbidity 02/11/2022   Insulin resistance 12/11/2021   Obesity, Current BMI 27.8 12/11/2021   Vitamin D insufficiency 11/05/2021   Right upper quadrant pain 11/02/2021   Class 1 obesity due to excess calories with body mass index (BMI) of 30.0 to 30.9 in adult 08/13/2021   Trapezius muscle strain, right, initial encounter 03/20/2021   Encounter for surveillance of abnormal nevi 07/09/2020   Acquired hypothyroidism 07/07/2020   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   Neck pain 01/18/2020   S/P laparoscopic assisted vaginal hysterectomy (LAVH) 12/30/2019   External  thrombosed hemorrhoids 09/11/2019   MDD (major depressive disorder) 08/24/2019   Orthostatic hypotension 08/24/2019   Hypothyroidism 07/06/2018   Family history of congenital anomaly 08/27/2016    Past Surgical History:  Procedure Laterality Date   BREAST CYST ASPIRATION Right 02/13/2018   CYSTOSCOPY N/A 12/30/2019   Procedure: CYSTOSCOPY;  Surgeon: Sherian Rein, MD;  Location: [facility];  Service: Gynecology;  Laterality: N/A;   ENDOVENOUS ABLATION SAPHENOUS VEIN W/ LASER Right 10/08/2018   endovenous laser ablation right saphenous vein by Waverly Ferrari MD   LAPAROSCOPIC VAGINAL HYSTERECTOMY WITH SALPINGECTOMY Bilateral 12/30/2019   Procedure: LAPAROSCOPIC ASSISTED VAGINAL HYSTERECTOMY WITH SALPINGECTOMY;  Surgeon: Sherian Rein, MD;  Location: [facility];  Service: Gynecology;  Laterality: Bilateral;    OB History     Gravida  6   Para  5   Term  5   Preterm  0   AB  1   Living  5      SAB  1   IAB  0   Ectopic  0   Multiple  0   Live Births  2            Home Medications    Prior to Admission medications   Medication Sig Start Date End Date Taking? Authorizing Provider  levothyroxine (SYNTHROID) 75 MCG tablet TAKE 1 TABLET(75 MCG) BY MOUTH DAILY  01/08/22  Yes Shamleffer, Konrad Dolores, MD  metFORMIN (GLUCOPHAGE) 500 MG tablet TAKE 1 TABLET(500 MG) BY MOUTH DAILY WITH BREAKFAST 03/13/22  Yes Rayburn, Fanny Bien, PA-C  Multiple Vitamins-Minerals (MULTIVITAMIN WITH MINERALS) tablet Take 1 tablet by mouth daily.   Yes [provider]  sulfamethoxazole-trimethoprim (BACTRIM DS) 800-160 MG tablet Take 1 tablet by mouth 2 (two) times daily for 10 days. 03/17/22 03/27/22 Yes Ellsworth Lennox, PA-C  acetaminophen (TYLENOL) 500 MG tablet Take 1,000 mg by mouth every 6 (six) hours as needed for mild pain, moderate pain, fever or headache.    [provider]  cholecalciferol (VITAMIN D3) 25 MCG  (1000 UNIT) tablet Take 1,000 Units by mouth daily.    [provider]  Cyanocobalamin (B-12 PO) Take 1 tablet by mouth daily.    [provider]  ergocalciferol (VITAMIN D2) 1.25 MG (50000 UT) capsule Take 1 capsule (50,000 Units total) by mouth once a week. 03/11/22   Rayburn, Fanny Bien, PA-C  escitalopram (LEXAPRO) 20 MG tablet Take 1 tablet (20 mg total) by mouth daily. 08/24/21   Shirlean Mylar, MD  famotidine (PEPCID) 20 MG tablet Take 1 tablet (20 mg total) by mouth 2 (two) times daily. 11/07/21   Shirlean Mylar, MD  ibuprofen (ADVIL) 200 MG tablet Take 200 mg by mouth every 6 (six) hours as needed for mild pain.    [provider]  Boris Lown Oil (OMEGA-3) 500 MG CAPS Take 500 mg by mouth daily.    [provider]  ondansetron (ZOFRAN) 4 MG tablet Take 1 tablet (4 mg total) by mouth every 8 (eight) hours as needed for nausea or vomiting. 03/07/22   Tiffany Kocher, DO  Psyllium (FIBER) 28.3 % POWD Take 1 Package by mouth 2 (two) times daily. 03/17/20   Leeroy Bock, MD    Family History Family History  Problem Relation Age of Onset   High blood pressure Mother    High Cholesterol Mother    Obesity Mother    Heart disease Father    Breast cancer Maternal Grandmother    Heart disease Paternal Grandfather    Breast cancer Maternal Aunt    Breast cancer Maternal Aunt    Breast cancer Cousin        maternal   Colon cancer Neg Hx    Esophageal cancer Neg Hx    Rectal cancer Neg Hx    Stomach cancer Neg Hx     Social History Social History   Tobacco Use   Smoking status: Never    Passive exposure: Never   Smokeless tobacco: Never  Vaping Use   Vaping Use: Never used  Substance Use Topics   Alcohol use: Yes    Comment: once a year   Drug use: Never     Allergies   Patient has no known allergies.   Review of Systems Review of Systems  Skin:  Positive for rash (cyst left upper nasal bridge).     Physical Exam Triage  Vital Signs ED Triage Vitals  Enc Vitals Group     BP 03/17/22 1854 117/81     Pulse Rate 03/17/22 1854 66     Resp 03/17/22 1854 16     Temp 03/17/22 1854 98.2 F (36.8 C)     Temp Source 03/17/22 1854 Oral     SpO2 03/17/22 1854 99 %     Weight --      Height --      Head Circumference --  Peak Flow --      Pain Score 03/17/22 1853 5     Pain Loc --      Pain Edu? --      Excl. in GC? --    No data found.  Updated Vital Signs BP 117/81 (BP Location: Left Arm)   Pulse 66   Temp 98.2 F (36.8 C) (Oral)   Resp 16   LMP 12/19/2019   SpO2 99%   Visual Acuity Right Eye Distance: 20/13 Left Eye Distance: 20/25 Bilateral Distance: 20/13  Right Eye Near:   Left Eye Near:    Bilateral Near:     Physical Exam Constitutional:      Appearance: Normal appearance.  HENT:     Mouth/Throat:     Mouth: Mucous membranes are moist.     Pharynx: Oropharynx is clear.  Lymphadenopathy:     Cervical: No cervical adenopathy.  Skin:    Comments: Face: There is a 2.25 cm cyst on the upper inside nasal bridge that has redness and pointing without drainage.  There is also mild redness below the left eye in the upper cheek with tenderness on palpation, no swelling.  The rest of the face on the left side appears normal  Neurological:     Mental Status: She is alert.      UC Treatments / Results  Labs (all labs ordered are listed, but only abnormal results are displayed) Labs Reviewed - No data to display  EKG   Radiology No results found.  Procedures Procedures (including critical care time)  Medications Ordered in UC Medications - No data to display  Initial Impression / Assessment and Plan / UC Course  I have reviewed the triage vital signs and the nursing notes.  Pertinent labs & imaging results that were available during my care of the patient were reviewed by me and considered in my medical decision making (see chart for details).    Plan: The facial  cellulitis will be treated with the following: A.  Bactrim DS, 2 initially and then 1 every 12 hours to treat the infection. 2.  The facial pain will be treated with the following: A.  Aleve OTC, 2 tablets every 12 hours to treat the pain. B.  Advised warm compresses frequently 4-5 times throughout the day to help the area drain. 3.  Advised patient to follow-up PCP or return to urgent care if symptoms fail to improve. Final Clinical Impressions(s) / UC Diagnoses   Final diagnoses:  Facial cellulitis  Facial pain     Discharge Instructions      Advised to take ibuprofen or Aleve for pain relief. Advised to use warm compresses frequently at least 4-6 times a day to help reduce swelling and discomfort. Advised take the Bactrim DS, 2 tablets initially and then 1 every 12 hours until completed. The warm compresses will help bring the area to ahead and help it to drain which is beneficial for the area to resolve and return to normal. Advised to follow-up with PCP or return to urgent care if symptoms fail to improve.    ED Prescriptions     Medication Sig Dispense Auth. Provider   sulfamethoxazole-trimethoprim (BACTRIM DS) 800-160 MG tablet Take 1 tablet by mouth 2 (two) times daily for 10 days. 20 tablet Ellsworth Lennox, PA-C      PDMP not reviewed this encounter.   Ellsworth Lennox, PA-C 03/17/22 1916

## 2022-03-17 NOTE — Discharge Instructions (Addendum)
Advised to take ibuprofen or Aleve for pain relief. Advised to use warm compresses frequently at least 4-6 times a day to help reduce swelling and discomfort. Advised take the Bactrim DS, 2 tablets initially and then 1 every 12 hours until completed. The warm compresses will help bring the area to ahead and help it to drain which is beneficial for the area to resolve and return to normal. Advised to follow-up with PCP or return to urgent care if symptoms fail to improve.

## 2022-03-17 NOTE — ED Triage Notes (Signed)
Bump on the left bridge of the nose 3 days ago. She tried to pop it but the area is now red, swollen, and spreading.   Having blurred vision and irritation as well.

## 2022-03-19 NOTE — Progress Notes (Signed)
Chief Complaint:   OBESITY Carol Welch is here to discuss her progress with her obesity treatment plan along with follow-up of her obesity related diagnoses. Carol Welch is on the Category 2 Plan and states she is following her eating plan approximately 95% of the time. Carol Welch states she is walking for 20-30 minutes 5 times per week.  Today's visit was #: 12 Starting weight: 167 lbs Starting date: 08/08/2021 Today's weight: 146 lbs Today's date: 03/11/2022 Total lbs lost to date: 21 Total lbs lost since last in-office visit: 4  Interim History: Carol Welch has done well with weight loss.  She is very anxious about regaining weight.  We discussed continuing to focus on getting an protein and strengthening to maintain muscle mass.  Subjective:   1. Insulin resistance Carol Welch's last A1c was 5.6, and insulin 5.7.  She is taking metformin 500 mg 1/2 tablet daily with no side effects noted with lower dose.  2. Vitamin D insufficiency Carol Welch's vitamin D level was 48 in October 2023.  She is taking vitamin D prescription with no side effects noted.  Assessment/Plan:   1. Insulin resistance Carol Welch will continue metformin at the lower dose.  She will continue with her diet and exercise.  2. Vitamin D insufficiency We will refill prescription Vitamin D for 90 days. She will follow-up for routine testing of Vitamin D in 3-4 months to avoid over-replacement.  - ergocalciferol (VITAMIN D2) 1.25 MG (50000 UT) capsule; Take 1 capsule (50,000 Units total) by mouth once a week.  Dispense: 4 capsule; Refill: 3  3. Obesity, Current BMI 26.8 Carol Welch is currently in the action stage of change. As such, her goal is to continue with weight loss efforts. She has agreed to keeping a food journal and adhering to recommended goals of 1000 calories and 80+ grams of protein daily.   Exercise goals: As is.   Behavioral modification strategies: increasing lean protein intake, decreasing simple carbohydrates, meal  planning and cooking strategies, holiday eating strategies , and planning for success.  Carol Welch has agreed to follow-up with our clinic in 4 weeks. She was informed of the importance of frequent follow-up visits to maximize her success with intensive lifestyle modifications for her multiple health conditions.   Objective:   Blood pressure 110/65, pulse 66, temperature 98.1 F (36.7 C), height 5\' 2"  (1.575 m), weight 146 lb (66.2 kg), last menstrual period 12/19/2019, SpO2 100 %. Body mass index is 26.7 kg/m.  General: Cooperative, alert, well developed, in no acute distress. HEENT: Conjunctivae and lids unremarkable. Cardiovascular: Regular rhythm.  Lungs: Normal work of breathing. Neurologic: No focal deficits.   Lab Results  Component Value Date   CREATININE 0.65 03/07/2022   BUN 10 03/07/2022   NA 140 03/07/2022   K 3.9 03/07/2022   CL 106 03/07/2022   CO2 24 03/07/2022   Lab Results  Component Value Date   ALT 8 03/07/2022   AST 13 03/07/2022   ALKPHOS 49 03/07/2022   BILITOT 0.3 03/07/2022   Lab Results  Component Value Date   HGBA1C 5.6 08/08/2021   Lab Results  Component Value Date   INSULIN 5.7 08/08/2021   Lab Results  Component Value Date   TSH 1.35 03/20/2021   No results found for: "CHOL", "HDL", "LDLCALC", "LDLDIRECT", "TRIG", "CHOLHDL" Lab Results  Component Value Date   VD25OH 48.0 02/11/2022   VD25OH 37.3 08/08/2021   VD25OH 41.65 12/05/2020   Lab Results  Component Value Date   WBC 5.8 11/01/2021  HGB 12.9 11/01/2021   HCT 38.1 11/01/2021   MCV 89 11/01/2021   PLT 223 11/01/2021   No results found for: "IRON", "TIBC", "FERRITIN"  Attestation Statements:   Reviewed by clinician on day of visit: allergies, medications, problem list, medical history, surgical history, family history, social history, and previous encounter notes.   I, Burt Knack, am acting as transcriptionist for Quillian Quince, MD.  I have reviewed the above  documentation for accuracy and completeness, and I agree with the above. -  Quillian Quince, MD

## 2022-03-20 DIAGNOSIS — F331 Major depressive disorder, recurrent, moderate: Secondary | ICD-10-CM | POA: Diagnosis not present

## 2022-03-22 ENCOUNTER — Encounter: Payer: Self-pay | Admitting: Student

## 2022-03-22 ENCOUNTER — Other Ambulatory Visit (HOSPITAL_COMMUNITY)
Admission: RE | Admit: 2022-03-22 | Discharge: 2022-03-22 | Disposition: A | Discharge status: Home / Self Care | Payer: Medicaid Other | Source: Ambulatory Visit | Attending: Family Medicine | Admitting: Family Medicine

## 2022-03-22 ENCOUNTER — Ambulatory Visit (INDEPENDENT_AMBULATORY_CARE_PROVIDER_SITE_OTHER): Payer: Medicaid Other | Admitting: Student

## 2022-03-22 VITALS — BP 108/60 | HR 62 | Ht 62.0 in | Wt 147.2 lb

## 2022-03-22 DIAGNOSIS — R1011 Right upper quadrant pain: Secondary | ICD-10-CM | POA: Diagnosis not present

## 2022-03-22 DIAGNOSIS — N898 Other specified noninflammatory disorders of vagina: Secondary | ICD-10-CM

## 2022-03-22 LAB — POCT WET PREP (WET MOUNT)
Clue Cells Wet Prep Whiff POC: NEGATIVE
Trichomonas Wet Prep HPF POC: ABSENT
WBC, Wet Prep HPF POC: NONE SEEN

## 2022-03-22 NOTE — Assessment & Plan Note (Signed)
Greatly improved from last visit.  She says that there is still lingering pain but it is much better than she had previously had.  She denies any systemic symptoms.  Last set of labs were all within normal limits.  Last hepatobiliary imaging with normal EF this year.  I discussed this may be some sort of muscle irritation or biliary colic that has improved a lot. -Monitor -Consider additional labs/imaging if continuing to have chest pain

## 2022-03-22 NOTE — Patient Instructions (Signed)
It was great to see you! Thank you for allowing me to participate in your care!   Our plans for today:  - I will let you know what your wet prep results and other testing shows - Let us know if this pain in your belly worsens  Take care and seek immediate care sooner if you develop any concerns.  Levin Erp, MD

## 2022-03-22 NOTE — Progress Notes (Signed)
    SUBJECTIVE:   CHIEF COMPLAINT / HPI:   Vaginal Discharge: Patient is a 46 y.o. female presenting with vaginal discharge for several days.  She states she recently had vaginal discharge at the beginning of this month that was treated for a yeast infection and then currently has another bout right now.  She did recently start Bactrim for facial cellulitis that is improved and has a couple more days of this.  The discharge is of white/thick consistency.  She is interested in screening for sexually transmitted infections today. She has contraception with hysterectomy.   RUQ Abdominal Pain: He says that her abdominal pain has improved greatly since last time she was seen here.  She says that it still lingers a bit all the time and sometimes has an intermittent sharp pain.  She denies any fevers/vomiting.  Labs including lipase, Hep C, CMP all wnl on 11/2. Previously had [facility] Hepatobiliary imaging with normal EF earlier this year.   PERTINENT  PMH / PSH: None relevant  OBJECTIVE:   BP 108/60   Pulse 62   Ht 5\' 2"  (1.575 m)   Wt 147 lb 3.2 oz (66.8 kg)   LMP 12/19/2019   SpO2 100%   BMI 26.92 kg/m    General: NAD, pleasant, able to participate in exam Respiratory: Normal effort, no obvious respiratory distress Abdomen: Abdomen nondistended, soft, negative Murphy sign's, mildly tender to deep palpation of right upper quadrant of abdomen, no CVA tenderness or suprapubic tenderness Pelvic: VULVA: normal appearing vulva with no masses, tenderness or lesions, VAGINA: Normal appearing vagina with normal color, no lesions, with white discharge present, CERVIX: No lesions, white discharge present  Chaperone 12/21/2019 CMA present for pelvic exam  ASSESSMENT/PLAN:   Right upper quadrant pain Greatly improved from last visit.  She says that there is still lingering pain but it is much better than she had previously had.  She denies any systemic symptoms.  Last set of labs were all within  normal limits.  Last hepatobiliary imaging with normal EF this year.  I discussed this may be some sort of muscle irritation or biliary colic that has improved a lot. -Monitor -Consider additional labs/imaging if continuing to have chest pain  Vaginal Discharge 46 y.o. female with vaginal discharge for several days. Physical exam significant for whitish discharge.  Wet prep performed today shows no growth. Patient is interested in STI screening.   Plan: -Wet prep as above. If still having vaginal symptoms after antibiotic course can consider diflucan x 1.  -G/C, HIV, RPR -Discussed protection during intercourse -Follow-up as needed  55, MD [facility]

## 2022-03-23 LAB — HIV ANTIBODY (ROUTINE TESTING W REFLEX): HIV Screen 4th Generation wRfx: NONREACTIVE

## 2022-03-23 LAB — RPR: RPR Ser Ql: NONREACTIVE

## 2022-03-25 LAB — CERVICOVAGINAL ANCILLARY ONLY
Chlamydia: NEGATIVE
Comment: NEGATIVE
Comment: NEGATIVE
Comment: NORMAL
Neisseria Gonorrhea: NEGATIVE
Trichomonas: NEGATIVE

## 2022-03-27 DIAGNOSIS — F331 Major depressive disorder, recurrent, moderate: Secondary | ICD-10-CM | POA: Diagnosis not present

## 2022-04-03 ENCOUNTER — Other Ambulatory Visit: Payer: Self-pay | Admitting: Internal Medicine

## 2022-04-03 DIAGNOSIS — F331 Major depressive disorder, recurrent, moderate: Secondary | ICD-10-CM | POA: Diagnosis not present

## 2022-04-05 DIAGNOSIS — Z419 Encounter for procedure for purposes other than remedying health state, unspecified: Secondary | ICD-10-CM | POA: Diagnosis not present

## 2022-04-08 ENCOUNTER — Encounter (INDEPENDENT_AMBULATORY_CARE_PROVIDER_SITE_OTHER): Payer: Self-pay | Admitting: Family Medicine

## 2022-04-08 ENCOUNTER — Ambulatory Visit (INDEPENDENT_AMBULATORY_CARE_PROVIDER_SITE_OTHER): Payer: Medicaid Other | Admitting: Family Medicine

## 2022-04-08 ENCOUNTER — Other Ambulatory Visit (INDEPENDENT_AMBULATORY_CARE_PROVIDER_SITE_OTHER): Payer: Self-pay | Admitting: Family Medicine

## 2022-04-08 VITALS — BP 98/59 | HR 62 | Temp 98.3°F | Ht 62.0 in | Wt 145.0 lb

## 2022-04-08 DIAGNOSIS — Z6826 Body mass index (BMI) 26.0-26.9, adult: Secondary | ICD-10-CM

## 2022-04-08 DIAGNOSIS — E039 Hypothyroidism, unspecified: Secondary | ICD-10-CM | POA: Diagnosis not present

## 2022-04-08 DIAGNOSIS — E669 Obesity, unspecified: Secondary | ICD-10-CM | POA: Diagnosis not present

## 2022-04-08 DIAGNOSIS — E88819 Insulin resistance, unspecified: Secondary | ICD-10-CM

## 2022-04-08 DIAGNOSIS — E559 Vitamin D deficiency, unspecified: Secondary | ICD-10-CM | POA: Diagnosis not present

## 2022-04-08 DIAGNOSIS — E6609 Other obesity due to excess calories: Secondary | ICD-10-CM

## 2022-04-08 MED ORDER — ERGOCALCIFEROL 1.25 MG (50000 UT) PO CAPS: 50000.0000 [IU] | ORAL_CAPSULE | ORAL | 3 refills | Status: DC

## 2022-04-08 MED ORDER — METFORMIN HCL 500 MG PO TABS: ORAL_TABLET | ORAL | 0 refills | Status: DC

## 2022-04-08 MED ORDER — LEVOTHYROXINE SODIUM 75 MCG PO TABS: ORAL_TABLET | ORAL | 0 refills | Status: DC

## 2022-04-10 ENCOUNTER — Other Ambulatory Visit (INDEPENDENT_AMBULATORY_CARE_PROVIDER_SITE_OTHER): Payer: Self-pay | Admitting: Family Medicine

## 2022-04-10 DIAGNOSIS — E88819 Insulin resistance, unspecified: Secondary | ICD-10-CM

## 2022-04-18 NOTE — Progress Notes (Signed)
Chief Complaint:   OBESITY Carol Welch is here to discuss her progress with her obesity treatment plan along with follow-up of her obesity related diagnoses. Carol Welch is on keeping a food journal and adhering to recommended goals of 1000 calories and 80+ grams of protein and states she is following her eating plan approximately 95% of the time. Carol Welch states she is walking for 20 minutes 3 times per week.  Today's visit was #: 13 Starting weight: 167 lbs Starting date: 08/08/2021 Today's weight: 145 lbs Today's date: 04/08/2022 Total lbs lost to date: 22 Total lbs lost since last in-office visit: 1  Interim History: Carol Welch has done well with avoiding holiday weight gain over Thanksgiving. She will have company for Christmas for an extended period of time, and she is concerned about holiday weight gain.   Subjective:   1. Insulin resistance Carol Welch notes increased PM hunger recently even on metformin.   2. Vitamin D insufficiency Carol Welch is on Vitamin D, with no side effects noted.   3. Acquired hypothyroidism Carol Welch is stable on her medications, and she requests an emergency refill as she is waiting to see her PCP.   Assessment/Plan:   1. Insulin resistance Carol Welch agreed to increase metformin to 500 mg BID #60 with no refills.   - metFORMIN (GLUCOPHAGE) 500 MG tablet; TAKE 1 TABLET(500 MG) BY MOUTH TWICE DAILY  Dispense: 60 tablet; Refill: 0  2. Vitamin D insufficiency We will refill prescription Vitamin D for 90 days with no refills. Carol Welch will follow-up for routine testing of Vitamin D, at least 2-3 times per year to avoid over-replacement.  - ergocalciferol (VITAMIN D2) 1.25 MG (50000 UT) capsule; Take 1 capsule (50,000 Units total) by mouth once a week.  Dispense: 4 capsule; Refill: 3  3. Acquired hypothyroidism We will refill levothyroxine for 1 month. Carol Welch will follow-up with her PCP.   - levothyroxine (SYNTHROID) 75 MCG tablet; TAKE 1 TABLET(75 MCG) BY MOUTH  DAILY  Dispense: 30 tablet; Refill: 0  4. Obesity, Current BMI 26.5 Carol Welch is currently in the action stage of change. As such, her goal is to continue with weight loss efforts. She has agreed to keeping a food journal and adhering to recommended goals of 1000 calories and 80+ grams of protein daily.   Exercise goals: As is.   Behavioral modification strategies: increasing lean protein intake, meal planning and cooking strategies, and holiday eating strategies .  Carol Welch has agreed to follow-up with our clinic in 4 weeks. She was informed of the importance of frequent follow-up visits to maximize her success with intensive lifestyle modifications for her multiple health conditions.   Objective:   Blood pressure (!) 98/59, pulse 62, temperature 98.3 F (36.8 C), height 5\' 2"  (1.575 m), weight 145 lb (65.8 kg), last menstrual period 12/19/2019, SpO2 99 %. Body mass index is 26.52 kg/m.  General: Cooperative, alert, well developed, in no acute distress. HEENT: Conjunctivae and lids unremarkable. Cardiovascular: Regular rhythm.  Lungs: Normal work of breathing. Neurologic: No focal deficits.   Lab Results  Component Value Date   CREATININE 0.65 03/07/2022   BUN 10 03/07/2022   NA 140 03/07/2022   K 3.9 03/07/2022   CL 106 03/07/2022   CO2 24 03/07/2022   Lab Results  Component Value Date   ALT 8 03/07/2022   AST 13 03/07/2022   ALKPHOS 49 03/07/2022   BILITOT 0.3 03/07/2022   Lab Results  Component Value Date   HGBA1C 5.6 08/08/2021   Lab  Results  Component Value Date   INSULIN 5.7 08/08/2021   Lab Results  Component Value Date   TSH 1.35 03/20/2021   No results found for: "CHOL", "HDL", "LDLCALC", "LDLDIRECT", "TRIG", "CHOLHDL" Lab Results  Component Value Date   VD25OH 48.0 02/11/2022   VD25OH 37.3 08/08/2021   VD25OH 41.65 12/05/2020   Lab Results  Component Value Date   WBC 5.8 11/01/2021   HGB 12.9 11/01/2021   HCT 38.1 11/01/2021   MCV 89 11/01/2021    PLT 223 11/01/2021   No results found for: "IRON", "TIBC", "FERRITIN"  Attestation Statements:   Reviewed by clinician on day of visit: allergies, medications, problem list, medical history, surgical history, family history, social history, and previous encounter notes.   I, Burt Knack, am acting as transcriptionist for Quillian Quince, MD.  I have reviewed the above documentation for accuracy and completeness, and I agree with the above. -  Quillian Quince, MD

## 2022-04-19 DIAGNOSIS — N644 Mastodynia: Secondary | ICD-10-CM | POA: Diagnosis not present

## 2022-04-22 ENCOUNTER — Other Ambulatory Visit: Payer: Self-pay | Admitting: Obstetrics and Gynecology

## 2022-04-22 DIAGNOSIS — N644 Mastodynia: Secondary | ICD-10-CM

## 2022-04-24 DIAGNOSIS — F331 Major depressive disorder, recurrent, moderate: Secondary | ICD-10-CM | POA: Diagnosis not present

## 2022-05-06 DIAGNOSIS — Z419 Encounter for procedure for purposes other than remedying health state, unspecified: Secondary | ICD-10-CM | POA: Diagnosis not present

## 2022-05-14 ENCOUNTER — Ambulatory Visit (INDEPENDENT_AMBULATORY_CARE_PROVIDER_SITE_OTHER): Payer: Medicaid Other | Admitting: Family Medicine

## 2022-05-15 ENCOUNTER — Ambulatory Visit (INDEPENDENT_AMBULATORY_CARE_PROVIDER_SITE_OTHER): Payer: Medicaid Other | Admitting: Family Medicine

## 2022-05-16 ENCOUNTER — Other Ambulatory Visit (INDEPENDENT_AMBULATORY_CARE_PROVIDER_SITE_OTHER): Payer: Self-pay | Admitting: Family Medicine

## 2022-05-16 DIAGNOSIS — E039 Hypothyroidism, unspecified: Secondary | ICD-10-CM

## 2022-05-22 DIAGNOSIS — F331 Major depressive disorder, recurrent, moderate: Secondary | ICD-10-CM | POA: Diagnosis not present

## 2022-05-24 ENCOUNTER — Other Ambulatory Visit (INDEPENDENT_AMBULATORY_CARE_PROVIDER_SITE_OTHER): Payer: Self-pay | Admitting: Family Medicine

## 2022-05-24 DIAGNOSIS — E88819 Insulin resistance, unspecified: Secondary | ICD-10-CM

## 2022-05-30 DIAGNOSIS — F331 Major depressive disorder, recurrent, moderate: Secondary | ICD-10-CM | POA: Diagnosis not present

## 2022-06-06 DIAGNOSIS — Z419 Encounter for procedure for purposes other than remedying health state, unspecified: Secondary | ICD-10-CM | POA: Diagnosis not present

## 2022-06-12 DIAGNOSIS — F331 Major depressive disorder, recurrent, moderate: Secondary | ICD-10-CM | POA: Diagnosis not present

## 2022-06-13 ENCOUNTER — Encounter (INDEPENDENT_AMBULATORY_CARE_PROVIDER_SITE_OTHER): Payer: Self-pay | Admitting: Family Medicine

## 2022-06-13 ENCOUNTER — Ambulatory Visit (INDEPENDENT_AMBULATORY_CARE_PROVIDER_SITE_OTHER): Payer: Medicaid Other | Admitting: Family Medicine

## 2022-06-13 VITALS — BP 121/79 | HR 58 | Temp 98.1°F | Ht 62.0 in | Wt 145.0 lb

## 2022-06-13 DIAGNOSIS — E669 Obesity, unspecified: Secondary | ICD-10-CM | POA: Diagnosis not present

## 2022-06-13 DIAGNOSIS — E88819 Insulin resistance, unspecified: Secondary | ICD-10-CM | POA: Diagnosis not present

## 2022-06-13 DIAGNOSIS — Z6826 Body mass index (BMI) 26.0-26.9, adult: Secondary | ICD-10-CM | POA: Diagnosis not present

## 2022-06-13 DIAGNOSIS — E559 Vitamin D deficiency, unspecified: Secondary | ICD-10-CM | POA: Diagnosis not present

## 2022-06-13 MED ORDER — METFORMIN HCL 500 MG PO TABS: ORAL_TABLET | ORAL | 0 refills | Status: DC

## 2022-06-13 MED ORDER — ERGOCALCIFEROL 1.25 MG (50000 UT) PO CAPS: 50000.0000 [IU] | ORAL_CAPSULE | ORAL | 3 refills | Status: DC

## 2022-06-27 NOTE — Progress Notes (Signed)
Chief Complaint:   OBESITY Carol Welch is here to discuss her progress with her obesity treatment plan along with follow-up of her obesity related diagnoses. Carol Welch is on keeping a food journal and adhering to recommended goals of 1000 calories and 80+ grams of protein and states she is following her eating plan approximately 50% of the time. Carol Welch states she is doing 0 minutes 0 times per week.  Today's visit was #: 14 Starting weight: 167 lbs Starting date: 08/08/2021 Today's weight: 145 lbs Today's date: 06/13/2022 Total lbs lost to date: 22 Total lbs lost since last in-office visit: 0  Interim History: Carol Welch did well with avoiding holiday weight gain. She did well with portion control and making smarter choices. She has questions about loose skin on her abdomen.   Subjective:   1. Insulin resistance Carol Welch is working on her diet and weight loss, and she is doing well on metformin. She denies nausea or vomiting.   2. Vitamin D insufficiency Carol Welch is on Vitamin D prescription, and her last vit D level was almost at goal.   Assessment/Plan:   1. Insulin resistance Carol Welch will continue metformin, and we will refill for 1 month. We will recheck labs in 1 month.   - metFORMIN (GLUCOPHAGE) 500 MG tablet; TAKE 1 TABLET(500 MG) BY MOUTH DAILY WITH BREAKFAST  Dispense: 30 tablet; Refill: 0  2. Vitamin D insufficiency Carol Welch will continue prescription Vitamin D, and we will refill for 90 days. We will recheck labs in 1 month.   - ergocalciferol (VITAMIN D2) 1.25 MG (50000 UT) capsule; Take 1 capsule (50,000 Units total) by mouth once a week.  Dispense: 4 capsule; Refill: 3  3. BMI 26.0-26.9,adult  4. Obesity, Beginning BMI 30.54 Carol Welch is currently in the action stage of change. As such, her goal is to continue with weight loss efforts. She has agreed to keeping a food journal and adhering to recommended goals of 1000 calories and 80+ grams of protein daily.   Patient was  encouraged to increase protein and use moisture on the looser skin. She may want to consider surgery when she gets to her goal.   Exercise goals: All adults should avoid inactivity. Some physical activity is better than none, and adults who participate in any amount of physical activity gain some health benefits.  Behavioral modification strategies: increasing lean protein intake and increasing water intake.  Carol Welch has agreed to follow-up with our clinic in 4 weeks. She was informed of the importance of frequent follow-up visits to maximize her success with intensive lifestyle modifications for her multiple health conditions.   Objective:   Blood pressure 121/79, pulse (!) 58, temperature 98.1 F (36.7 C), height 5' 2"$  (1.575 m), weight 145 lb (65.8 kg), last menstrual period 12/19/2019, SpO2 99 %. Body mass index is 26.52 kg/m.  General: Cooperative, alert, well developed, in no acute distress. HEENT: Conjunctivae and lids unremarkable. Cardiovascular: Regular rhythm.  Lungs: Normal work of breathing. Neurologic: No focal deficits.   Lab Results  Component Value Date   CREATININE 0.65 03/07/2022   BUN 10 03/07/2022   NA 140 03/07/2022   K 3.9 03/07/2022   CL 106 03/07/2022   CO2 24 03/07/2022   Lab Results  Component Value Date   ALT 8 03/07/2022   AST 13 03/07/2022   ALKPHOS 49 03/07/2022   BILITOT 0.3 03/07/2022   Lab Results  Component Value Date   HGBA1C 5.6 08/08/2021   Lab Results  Component Value Date  INSULIN 5.7 08/08/2021   Lab Results  Component Value Date   TSH 1.35 03/20/2021   No results found for: "CHOL", "HDL", "LDLCALC", "LDLDIRECT", "TRIG", "CHOLHDL" Lab Results  Component Value Date   VD25OH 48.0 02/11/2022   VD25OH 37.3 08/08/2021   VD25OH 41.65 12/05/2020   Lab Results  Component Value Date   WBC 5.8 11/01/2021   HGB 12.9 11/01/2021   HCT 38.1 11/01/2021   MCV 89 11/01/2021   PLT 223 11/01/2021   No results found for: "IRON",  "TIBC", "FERRITIN"  Attestation Statements:   Reviewed by clinician on day of visit: allergies, medications, problem list, medical history, surgical history, family history, social history, and previous encounter notes.   I, Trixie Dredge, am acting as transcriptionist for Dennard Nip, MD.  I have reviewed the above documentation for accuracy and completeness, and I agree with the above. -  Dennard Nip, MD

## 2022-06-28 DIAGNOSIS — F331 Major depressive disorder, recurrent, moderate: Secondary | ICD-10-CM | POA: Diagnosis not present

## 2022-07-03 DIAGNOSIS — F331 Major depressive disorder, recurrent, moderate: Secondary | ICD-10-CM | POA: Diagnosis not present

## 2022-07-05 ENCOUNTER — Ambulatory Visit
Admission: RE | Admit: 2022-07-05 | Discharge: 2022-07-05 | Disposition: A | Discharge status: Home / Self Care | Payer: Medicaid Other | Source: Ambulatory Visit | Attending: Obstetrics and Gynecology | Admitting: Obstetrics and Gynecology

## 2022-07-05 ENCOUNTER — Other Ambulatory Visit: Payer: Self-pay | Admitting: Obstetrics and Gynecology

## 2022-07-05 DIAGNOSIS — N6310 Unspecified lump in the right breast, unspecified quadrant: Secondary | ICD-10-CM

## 2022-07-05 DIAGNOSIS — Z419 Encounter for procedure for purposes other than remedying health state, unspecified: Secondary | ICD-10-CM | POA: Diagnosis not present

## 2022-07-05 DIAGNOSIS — N644 Mastodynia: Secondary | ICD-10-CM

## 2022-07-08 ENCOUNTER — Other Ambulatory Visit: Payer: Self-pay

## 2022-07-08 ENCOUNTER — Encounter: Payer: Self-pay | Admitting: Student

## 2022-07-08 ENCOUNTER — Ambulatory Visit (INDEPENDENT_AMBULATORY_CARE_PROVIDER_SITE_OTHER): Payer: Medicaid Other | Admitting: Student

## 2022-07-08 VITALS — BP 108/45 | HR 67 | Ht 62.0 in | Wt 151.2 lb

## 2022-07-08 DIAGNOSIS — E038 Other specified hypothyroidism: Secondary | ICD-10-CM | POA: Diagnosis not present

## 2022-07-08 DIAGNOSIS — Z1211 Encounter for screening for malignant neoplasm of colon: Secondary | ICD-10-CM

## 2022-07-08 DIAGNOSIS — E039 Hypothyroidism, unspecified: Secondary | ICD-10-CM | POA: Diagnosis not present

## 2022-07-08 MED ORDER — LEVOTHYROXINE SODIUM 75 MCG PO TABS: ORAL_TABLET | ORAL | 0 refills | Status: DC

## 2022-07-08 NOTE — Progress Notes (Signed)
    SUBJECTIVE:   CHIEF COMPLAINT / HPI:   Hypothyroid Patient reports she needs repeat TSH before pharmacy will refill her medications.  Currently on 75 mcg Synthroid.  Has not missed a dose, still has enough to last 1 week.  Last TSH was 1115/2022 and WNL.  Currently without symptoms.  Health maintenance Due for Pap smear in April 2024.  She will make appointment. Due for colon cancer screening.  Interested in colonoscopy.  PERTINENT  PMH / PSH: Hypothyroid, MDD, obesity, vitamin D deficiency  OBJECTIVE:   BP (!) 108/45   Pulse 67   Ht '5\' 2"'$  (1.575 m)   Wt 151 lb 3.2 oz (68.6 kg)   LMP 12/19/2019   SpO2 100%   BMI 27.65 kg/m    General: NAD, pleasant HEENT: Normocephalic, atraumatic head.  EOM intact and normal conjunctiva BL. Normal external nose. Cardio: RRR, no MRG. Respiratory: CTAB, normal wob on RA GI: Abdomen is soft, not tender, not distended. BS present Skin: Warm and dry   ASSESSMENT/PLAN:   Hypothyroidism Refilled 75 mcg Synthroid.  Will check TSH. - TSH  Health maintenance - Patient will schedule Pap smear - Colonoscopy ordered   Leslie Dales, [place]

## 2022-07-08 NOTE — Assessment & Plan Note (Signed)
Refilled 75 mcg Synthroid.  Will check TSH. - TSH

## 2022-07-08 NOTE — Patient Instructions (Signed)
It was great to see you! Thank you for allowing me to participate in your care!   I recommend that you always bring your medications to each appointment as this makes it easy to ensure we are on the correct medications and helps [place] not miss when refills are needed.  Our plans for today:  - You will receive a call to schedule colonoscopy in 2-4 weeks. - Please make appointment for pap-smear in April - I will refill your synthroid and adjust if needed  We are checking some labs today, I will call you if they are abnormal will send you a MyChart message or a letter if they are normal.  If you do not hear about your labs in the next 2 weeks please let us know.  Take care and seek immediate care sooner if you develop any concerns. Please remember to show up 15 minutes before your scheduled appointment time!  Leslie Dales, DO [facility]

## 2022-07-09 ENCOUNTER — Ambulatory Visit
Admission: RE | Admit: 2022-07-09 | Discharge: 2022-07-09 | Disposition: A | Discharge status: Home / Self Care | Payer: Medicaid Other | Source: Ambulatory Visit | Attending: Obstetrics and Gynecology | Admitting: Obstetrics and Gynecology

## 2022-07-09 DIAGNOSIS — D241 Benign neoplasm of right breast: Secondary | ICD-10-CM | POA: Diagnosis not present

## 2022-07-09 DIAGNOSIS — N6310 Unspecified lump in the right breast, unspecified quadrant: Secondary | ICD-10-CM

## 2022-07-09 HISTORY — PX: BREAST BIOPSY: SHX20

## 2022-07-09 LAB — TSH: TSH: 0.499 u[IU]/mL (ref 0.450–4.500)

## 2022-07-10 DIAGNOSIS — F331 Major depressive disorder, recurrent, moderate: Secondary | ICD-10-CM | POA: Diagnosis not present

## 2022-07-17 ENCOUNTER — Encounter (INDEPENDENT_AMBULATORY_CARE_PROVIDER_SITE_OTHER): Payer: Self-pay | Admitting: Family Medicine

## 2022-07-17 ENCOUNTER — Ambulatory Visit (INDEPENDENT_AMBULATORY_CARE_PROVIDER_SITE_OTHER): Payer: Medicaid Other | Admitting: Family Medicine

## 2022-07-17 VITALS — BP 98/64 | HR 68 | Temp 97.8°F | Ht 62.0 in | Wt 145.0 lb

## 2022-07-17 DIAGNOSIS — Z6826 Body mass index (BMI) 26.0-26.9, adult: Secondary | ICD-10-CM

## 2022-07-17 DIAGNOSIS — E88819 Insulin resistance, unspecified: Secondary | ICD-10-CM | POA: Diagnosis not present

## 2022-07-17 DIAGNOSIS — E559 Vitamin D deficiency, unspecified: Secondary | ICD-10-CM

## 2022-07-17 DIAGNOSIS — E669 Obesity, unspecified: Secondary | ICD-10-CM | POA: Diagnosis not present

## 2022-07-17 MED ORDER — METFORMIN HCL 500 MG PO TABS: ORAL_TABLET | ORAL | 0 refills | Status: DC

## 2022-07-17 MED ORDER — ERGOCALCIFEROL 1.25 MG (50000 UT) PO CAPS: 50000.0000 [IU] | ORAL_CAPSULE | ORAL | 3 refills | Status: DC

## 2022-07-18 DIAGNOSIS — F331 Major depressive disorder, recurrent, moderate: Secondary | ICD-10-CM | POA: Diagnosis not present

## 2022-07-18 LAB — CMP14+EGFR
ALT: 10 IU/L (ref 0–32)
AST: 18 IU/L (ref 0–40)
Albumin/Globulin Ratio: 1.5 (ref 1.2–2.2)
Albumin: 4.1 g/dL (ref 3.9–4.9)
Alkaline Phosphatase: 51 IU/L (ref 44–121)
BUN/Creatinine Ratio: 29 — ABNORMAL HIGH (ref 9–23)
BUN: 20 mg/dL (ref 6–24)
Bilirubin Total: 0.3 mg/dL (ref 0.0–1.2)
CO2: 23 mmol/L (ref 20–29)
Calcium: 9.2 mg/dL (ref 8.7–10.2)
Chloride: 105 mmol/L (ref 96–106)
Creatinine, Ser: 0.7 mg/dL (ref 0.57–1.00)
Globulin, Total: 2.7 g/dL (ref 1.5–4.5)
Glucose: 87 mg/dL (ref 70–99)
Potassium: 4.4 mmol/L (ref 3.5–5.2)
Sodium: 142 mmol/L (ref 134–144)
Total Protein: 6.8 g/dL (ref 6.0–8.5)
eGFR: 109 mL/min/{1.73_m2} (ref >59)

## 2022-07-18 LAB — LIPID PANEL WITH LDL/HDL RATIO
Cholesterol, Total: 237 mg/dL — ABNORMAL HIGH (ref 100–199)
HDL: 66 mg/dL (ref >39)
LDL Chol Calc (NIH): 155 mg/dL — ABNORMAL HIGH (ref 0–99)
LDL/HDL Ratio: 2.3 ratio (ref 0.0–3.2)
Triglycerides: 91 mg/dL (ref 0–149)
VLDL Cholesterol Cal: 16 mg/dL (ref 5–40)

## 2022-07-18 LAB — INSULIN, RANDOM: INSULIN: 5.5 u[IU]/mL (ref 2.6–24.9)

## 2022-07-18 LAB — HEMOGLOBIN A1C
Est. average glucose Bld gHb Est-mCnc: 117 mg/dL
Hgb A1c MFr Bld: 5.7 % — ABNORMAL HIGH (ref 4.8–5.6)

## 2022-07-18 LAB — VITAMIN B12: Vitamin B-12: 1057 pg/mL (ref 232–1245)

## 2022-07-18 LAB — VITAMIN D 25 HYDROXY (VIT D DEFICIENCY, FRACTURES): Vit D, 25-Hydroxy: 49.7 ng/mL (ref 30.0–100.0)

## 2022-07-24 NOTE — Progress Notes (Unsigned)
Chief Complaint:   OBESITY Carol Welch is here to discuss her progress with her obesity treatment plan along with follow-up of her obesity related diagnoses. Carol Welch is on keeping a food journal and adhering to recommended goals of 1000 calories and 80+ grams of protein and states she is following her eating plan approximately 90% of the time. Carol Welch states she is walking for 20 minutes 5 times per week.  Today's visit was #: 15 Starting weight: 167 lbs Starting date: 08/08/2021 Today's weight: 145 lbs Today's date: 07/17/2022 Total lbs lost to date: 22 Total lbs lost since last in-office visit: 0  Interim History: Carol Welch has maintained her weight loss over the last month. She has been mindful of her eating but she is not journaling very strictly.   Subjective:   1. Insulin resistance Carol Welch is on metformin and she is increasing her exercise. She continue to increase vegetables and protein.   2. Vitamin D insufficiency Carol Welch is on Vitamin D, and she is due for labs.   Assessment/Plan:   1. Insulin resistance We will check labs today, and we will refill metformin for 1 month. Carol Welch will continue with her diet and exercise.  - metFORMIN (GLUCOPHAGE) 500 MG tablet; TAKE 1 TABLET(500 MG) BY MOUTH DAILY WITH BREAKFAST  Dispense: 30 tablet; Refill: 0 - Hemoglobin A1c - Insulin, random - Lipid Panel With LDL/HDL Ratio - CMP14+EGFR - Vitamin B12  2. Vitamin D insufficiency We will check labs today, and we will refill prescription Vitamin D for 90 days.   - ergocalciferol (VITAMIN D2) 1.25 MG (50000 UT) capsule; Take 1 capsule (50,000 Units total) by mouth once a week.  Dispense: 4 capsule; Refill: 3 - VITAMIN D 25 Hydroxy (Vit-D Deficiency, Fractures)  3. BMI 26.0-26.9,adult  4. Obesity, Beginning BMI 30.54 Carol Welch is currently in the action stage of change. As such, her goal is to continue with weight loss efforts. She has agreed to keeping a food journal and adhering to  recommended goals of 1000-1200 calories and 80+ grams of protein daily.   Exercise goals: As is.   Behavioral modification strategies: increasing lean protein intake and keeping a strict food journal.  Carol Welch has agreed to follow-up with our clinic in 4 weeks. She was informed of the importance of frequent follow-up visits to maximize her success with intensive lifestyle modifications for her multiple health conditions.   Carol Welch was informed we would discuss her lab results at her next visit unless there is a critical issue that needs to be addressed sooner. Carol Welch agreed to keep her next visit at the agreed upon time to discuss these results.  Objective:   Blood pressure 98/64, pulse 68, temperature 97.8 F (36.6 C), height 5\' 2"  (1.575 m), weight 145 lb (65.8 kg), last menstrual period 12/19/2019, SpO2 98 %. Body mass index is 26.52 kg/m.  Lab Results  Component Value Date   CREATININE 0.70 07/17/2022   BUN 20 07/17/2022   NA 142 07/17/2022   K 4.4 07/17/2022   CL 105 07/17/2022   CO2 23 07/17/2022   Lab Results  Component Value Date   ALT 10 07/17/2022   AST 18 07/17/2022   ALKPHOS 51 07/17/2022   BILITOT 0.3 07/17/2022   Lab Results  Component Value Date   HGBA1C 5.7 (H) 07/17/2022   HGBA1C 5.6 08/08/2021   Lab Results  Component Value Date   INSULIN 5.5 07/17/2022   INSULIN 5.7 08/08/2021   Lab Results  Component Value Date  TSH 0.499 07/08/2022   Lab Results  Component Value Date   CHOL 237 (H) 07/17/2022   HDL 66 07/17/2022   LDLCALC 155 (H) 07/17/2022   TRIG 91 07/17/2022   Lab Results  Component Value Date   VD25OH 49.7 07/17/2022   VD25OH 48.0 02/11/2022   VD25OH 37.3 08/08/2021   Lab Results  Component Value Date   WBC 5.8 11/01/2021   HGB 12.9 11/01/2021   HCT 38.1 11/01/2021   MCV 89 11/01/2021   PLT 223 11/01/2021   No results found for: "IRON", "TIBC", "FERRITIN"  Attestation Statements:   Reviewed by clinician on day of  visit: allergies, medications, problem list, medical history, surgical history, family history, social history, and previous encounter notes.   I, Trixie Dredge, am acting as transcriptionist for Dennard Nip, MD.  I have reviewed the above documentation for accuracy and completeness, and I agree with the above. -  Dennard Nip, MD

## 2022-07-26 ENCOUNTER — Other Ambulatory Visit: Payer: Self-pay | Admitting: Obstetrics and Gynecology

## 2022-07-26 DIAGNOSIS — Z1231 Encounter for screening mammogram for malignant neoplasm of breast: Secondary | ICD-10-CM

## 2022-07-26 DIAGNOSIS — F331 Major depressive disorder, recurrent, moderate: Secondary | ICD-10-CM | POA: Diagnosis not present

## 2022-07-31 DIAGNOSIS — F331 Major depressive disorder, recurrent, moderate: Secondary | ICD-10-CM | POA: Diagnosis not present

## 2022-08-01 DIAGNOSIS — N898 Other specified noninflammatory disorders of vagina: Secondary | ICD-10-CM | POA: Diagnosis not present

## 2022-08-01 DIAGNOSIS — Z803 Family history of malignant neoplasm of breast: Secondary | ICD-10-CM | POA: Diagnosis not present

## 2022-08-01 DIAGNOSIS — N9089 Other specified noninflammatory disorders of vulva and perineum: Secondary | ICD-10-CM | POA: Diagnosis not present

## 2022-08-05 DIAGNOSIS — Z419 Encounter for procedure for purposes other than remedying health state, unspecified: Secondary | ICD-10-CM | POA: Diagnosis not present

## 2022-08-09 ENCOUNTER — Telehealth: Payer: Self-pay | Admitting: Gastroenterology

## 2022-08-09 NOTE — Telephone Encounter (Signed)
Patient called to schedule a colonoscopy, not due until 2032. Currently having symptoms of constipation and vomiting blood. Did schedule her to follow up in May 29th, please advise on her symptoms.

## 2022-08-09 NOTE — Telephone Encounter (Signed)
Called and spoke with patient to clarify "vomiting blood". Pt states that sometimes she will get nauseated after eating then she vomits. After vomiting she will cough up minimal amount of BRB. I told pt that it is likely that her esophagus is irritated from the vomiting. Pt has been advised that if she starts to have coffee ground emesis then she will need to go to the ED for evaluation. Pt's appt has been rescheduled to Tuesday, 08/27/22 at 1:30 pm. Pt verbalized understanding and had no concerns at the end of the call.

## 2022-08-14 ENCOUNTER — Ambulatory Visit (INDEPENDENT_AMBULATORY_CARE_PROVIDER_SITE_OTHER): Payer: Medicaid Other | Admitting: Family Medicine

## 2022-08-14 ENCOUNTER — Encounter (INDEPENDENT_AMBULATORY_CARE_PROVIDER_SITE_OTHER): Payer: Self-pay | Admitting: Family Medicine

## 2022-08-14 VITALS — BP 117/70 | HR 79 | Temp 98.2°F | Ht 62.0 in | Wt 143.0 lb

## 2022-08-14 DIAGNOSIS — E559 Vitamin D deficiency, unspecified: Secondary | ICD-10-CM

## 2022-08-14 DIAGNOSIS — E669 Obesity, unspecified: Secondary | ICD-10-CM | POA: Diagnosis not present

## 2022-08-14 DIAGNOSIS — Z6826 Body mass index (BMI) 26.0-26.9, adult: Secondary | ICD-10-CM

## 2022-08-14 DIAGNOSIS — R7303 Prediabetes: Secondary | ICD-10-CM

## 2022-08-14 DIAGNOSIS — F331 Major depressive disorder, recurrent, moderate: Secondary | ICD-10-CM | POA: Diagnosis not present

## 2022-08-14 MED ORDER — METFORMIN HCL 500 MG PO TABS: ORAL_TABLET | ORAL | 0 refills | Status: DC

## 2022-08-14 MED ORDER — ERGOCALCIFEROL 1.25 MG (50000 UT) PO CAPS: 50000.0000 [IU] | ORAL_CAPSULE | ORAL | 0 refills | Status: DC

## 2022-08-19 NOTE — Progress Notes (Unsigned)
Chief Complaint:   OBESITY Carol Welch is here to discuss her progress with her obesity treatment plan along with follow-up of her obesity related diagnoses. Carol Welch is on keeping a food journal and adhering to recommended goals of 1000-1200 calories and 80+ grams of protein and states she is following her eating plan approximately 95% of the time. Carol Welch states she is walking for 20 minutes 4 times per week.  Today's visit was #: 16 Starting weight: 167 lbs Starting date: 08/08/2021 Today's weight: 143 lbs Today's date: 08/14/2022 Total lbs lost to date: 24 Total lbs lost since last in-office visit: 2  Interim History: Carol Welch continues to do well with journaling, and she is doing better with meeting her protein goals.  Her weight goal is 135 pounds.  Subjective:   1. Pre-diabetes Carol Welch's recent A1c was elevated at 5.7.  She is working on increasing her exercise.  I discussed labs with the patient today.  2. Vitamin D insufficiency Carol Welch's vitamin D level is almost at goal on vitamin D prescription and OTC vitamin D.  I discussed labs with the patient today.  Assessment/Plan:   1. Pre-diabetes Carol Welch will continue metformin 500 mg every morning, and we will refill for 1 month.  We will recheck labs in 3 months.  - metFORMIN (GLUCOPHAGE) 500 MG tablet; TAKE 1 TABLET(500 MG) BY MOUTH DAILY WITH BREAKFAST  Dispense: 30 tablet; Refill: 0  2. Vitamin D insufficiency Carol Welch will continue prescription vitamin D, and we will refill for 1 month.  We will recheck labs in 3 months.  - ergocalciferol (VITAMIN D2) 1.25 MG (50000 UT) capsule; Take 1 capsule (50,000 Units total) by mouth once a week.  Dispense: 4 capsule; Refill: 0  3. BMI 26.0-26.9,adult  4. Obesity, Beginning BMI 30.54 Carol Welch is currently in the action stage of change. As such, her goal is to continue with weight loss efforts. She has agreed to keeping a food journal and adhering to recommended goals of 1000-1200  calories and 80+ grams of protein daily.   Exercise goals: As is.   Behavioral modification strategies: increasing lean protein intake and increasing water intake.  Carol Welch has agreed to follow-up with our clinic in 4 weeks. She was informed of the importance of frequent follow-up visits to maximize her success with intensive lifestyle modifications for her multiple health conditions.   Objective:   Blood pressure 117/70, pulse 79, temperature 98.2 F (36.8 C), height 5\' 2"  (1.575 m), weight 143 lb (64.9 kg), last menstrual period 12/19/2019, SpO2 98 %. Body mass index is 26.16 kg/m.  Lab Results  Component Value Date   CREATININE 0.70 07/17/2022   BUN 20 07/17/2022   NA 142 07/17/2022   K 4.4 07/17/2022   CL 105 07/17/2022   CO2 23 07/17/2022   Lab Results  Component Value Date   ALT 10 07/17/2022   AST 18 07/17/2022   ALKPHOS 51 07/17/2022   BILITOT 0.3 07/17/2022   Lab Results  Component Value Date   HGBA1C 5.7 (H) 07/17/2022   HGBA1C 5.6 08/08/2021   Lab Results  Component Value Date   INSULIN 5.5 07/17/2022   INSULIN 5.7 08/08/2021   Lab Results  Component Value Date   TSH 0.499 07/08/2022   Lab Results  Component Value Date   CHOL 237 (H) 07/17/2022   HDL 66 07/17/2022   LDLCALC 155 (H) 07/17/2022   TRIG 91 07/17/2022   Lab Results  Component Value Date   VD25OH 49.7 07/17/2022  VD25OH 48.0 02/11/2022   VD25OH 37.3 08/08/2021   Lab Results  Component Value Date   WBC 5.8 11/01/2021   HGB 12.9 11/01/2021   HCT 38.1 11/01/2021   MCV 89 11/01/2021   PLT 223 11/01/2021   No results found for: "IRON", "TIBC", "FERRITIN"  Attestation Statements:   Reviewed by clinician on day of visit: allergies, medications, problem list, medical history, surgical history, family history, social history, and previous encounter notes.   I, Burt Knack, am acting as transcriptionist for Quillian Quince, MD.  I have reviewed the above documentation for  accuracy and completeness, and I agree with the above. -  Quillian Quince, MD

## 2022-08-21 DIAGNOSIS — F331 Major depressive disorder, recurrent, moderate: Secondary | ICD-10-CM | POA: Diagnosis not present

## 2022-08-23 NOTE — Progress Notes (Unsigned)
08/27/2022 Carol Welch [id number] [id number]  Referring provider: Tiffany Kocher, DO Primary GI doctor: Dr. Adela Lank  ASSESSMENT AND PLAN:   Postprandial epigastric pain with nausea, 2 episodes of hematemesis. Uncertain if this is Mallory-Weiss tear, esophagitis, gastritis, peptic ulcer disease, H. Pylori Added on PPI once daily Will schedule EGD. I discussed risks of EGD with patient today, including risk of sedation, bleeding or perforation.  Patient provides understanding and gave verbal consent to proceed. If this is negative we will consider cross-sectional imaging versus GES Had negative right upper quadrant ultrasound and HIDA  Chronic idiopathic constipation 2022 unremarkable colonoscopy recall 10 years Patient has had 5 kids, status post hysterectomy, some dyspareunia, no incontinence Add on Linzess 72 Will refer to pelvic floor physical therapy Suggest follow-up with GYN  Fatty liver Seen on recent right upper quadrant ultrasound --Continue to work on risk factor modification including diet exercise and control of risk factors including blood sugars. - monitor q 6 months.     Patient Care Team: Tiffany Kocher, DO as PCP - General (Family Medicine)  HISTORY OF PRESENT ILLNESS: 47 y.o. female with a past medical history of prediabetes, vitamin D insufficiency, obesity and others listed below presents for evaluation of constipation, post prandial AB pain, nausea.   08/11/2020 colonoscopy with Dr. Adela Lank for rectal bleeding TI normal internal hemorrhoids small, no polyps recall colonoscopy 10 years. 05/30/2021 AB [place] for AB pain, negative galltones, hepatic steatosis.  11/05/2021 HIDA negative  She has had constipation for years, she was taking as needed laxatives but she is now taking senokot daily and colace.  She has mainly soft stool but never has full BM and feels something is there.  She has to eat small frequent meals or she has AB pain, with  associated nausea. She has had vomiting with small volume bloody emesis this past month, states was moderate amount of BRB.  Denies dysphagia or painful swallowing.  She has excessive burping.  Has taken food diary, feels meats get more bloating and more time to digest or better with liquids.  She has lost some weight but has been trying.  No rashes.  She has some discomfort with intercourse.   She has 46 year old, 46 year old, 67 , 79, and 50 year old kids.   She denies blood thinner use.  She denies NSAID use.  She denies ETOH use.   She denies tobacco use.  She denies drug use.    She  reports that she has never smoked. She has never been exposed to tobacco smoke. She has never used smokeless tobacco. She reports current alcohol use. She reports that she does not use drugs.  RELEVANT LABS AND IMAGING: CBC    Component Value Date/Time   WBC 5.8 11/01/2021 0944   WBC 7.5 03/13/2021 1117   RBC 4.27 11/01/2021 0944   RBC 4.53 03/13/2021 1117   HGB 12.9 11/01/2021 0944   HCT 38.1 11/01/2021 0944   PLT 223 11/01/2021 0944   MCV 89 11/01/2021 0944   MCH 30.2 11/01/2021 0944   MCH 29.4 03/13/2021 1117   MCHC 33.9 11/01/2021 0944   MCHC 33.1 03/13/2021 1117   RDW 13.5 11/01/2021 0944   LYMPHSABS 2.0 11/01/2021 0944   MONOABS 0.7 10/04/2020 1536   EOSABS 0.2 11/01/2021 0944   BASOSABS 0.0 11/01/2021 0944   Recent Labs    11/01/21 0944  HGB 12.9    CMP     Component Value Date/Time   NA  142 07/17/2022 0919   K 4.4 07/17/2022 0919   CL 105 07/17/2022 0919   CO2 23 07/17/2022 0919   GLUCOSE 87 07/17/2022 0919   GLUCOSE 103 (H) 03/13/2021 1117   BUN 20 07/17/2022 0919   CREATININE 0.70 07/17/2022 0919   CALCIUM 9.2 07/17/2022 0919   PROT 6.8 07/17/2022 0919   ALBUMIN 4.1 07/17/2022 0919   AST 18 07/17/2022 0919   ALT 10 07/17/2022 0919   ALKPHOS 51 07/17/2022 0919   BILITOT 0.3 07/17/2022 0919   GFRNONAA >60 03/13/2021 1117   GFRAA >60 01/02/2020 1911       Latest Ref Rng & Units 07/17/2022    9:19 AM 03/07/2022    2:49 PM 11/02/2021   10:26 AM  Hepatic Function  Total Protein 6.0 - 8.5 g/dL 6.8  6.7  6.6   Albumin 3.9 - 4.9 g/dL 4.1  4.2  4.3   AST 0 - 40 IU/L ALT 0 - 32 IU/L Alk Phosphatase 44 - 121 IU/L 51  49  46   Total Bilirubin 0.0 - 1.2 mg/dL 0.3  0.3  0.2       Current Medications:   Current Outpatient Medications (Endocrine & Metabolic):    levothyroxine (SYNTHROID) 75 MCG tablet, TAKE 1 TABLET(75 MCG) BY MOUTH DAILY   metFORMIN (GLUCOPHAGE) 500 MG tablet, TAKE 1 TABLET(500 MG) BY MOUTH DAILY WITH BREAKFAST    Current Outpatient Medications (Analgesics):    acetaminophen (TYLENOL) 500 MG tablet, Take 1,000 mg by mouth every 6 (six) hours as needed for mild pain, moderate pain, fever or headache.   ibuprofen (ADVIL) 200 MG tablet, Take 200 mg by mouth every 6 (six) hours as needed for mild pain.  Current Outpatient Medications (Hematological):    Cyanocobalamin (B-12 PO), Take 1 tablet by mouth daily.  Current Outpatient Medications (Other):    cholecalciferol (VITAMIN D3) 25 MCG (1000 UNIT) tablet, Take 1,000 Units by mouth daily.   ergocalciferol (VITAMIN D2) 1.25 MG (50000 UT) capsule, Take 1 capsule (50,000 Units total) by mouth once a week.   escitalopram (LEXAPRO) 20 MG tablet, Take 1 tablet (20 mg total) by mouth daily.   famotidine (PEPCID) 20 MG tablet, Take 1 tablet (20 mg total) by mouth 2 (two) times daily.   Krill Oil (OMEGA-3) 500 MG CAPS, Take 500 mg by mouth daily.   Multiple Vitamins-Minerals (MULTIVITAMIN WITH MINERALS) tablet, Take 1 tablet by mouth daily.   ondansetron (ZOFRAN) 4 MG tablet, Take 1 tablet (4 mg total) by mouth every 8 (eight) hours as needed for nausea or vomiting.   pantoprazole (PROTONIX) 40 MG tablet, Take 1 tablet (40 mg total) by mouth daily.   Psyllium (FIBER) 28.3 % POWD, Take 1 Package by mouth 2 (two) times daily.  Medical History:  Past Medical History:   Diagnosis Date   Anal fissure    Colitis    Depression    Edema, lower extremity    Fatty liver    Hypothyroidism    Low blood pressure    PONV (postoperative nausea and vomiting)    SVD (spontaneous vaginal delivery) 06/30/2015   Vitamin D deficiency    Allergies: No Known Allergies   Surgical History:  She  has a past surgical history that includes Endovenous ablation saphenous vein w/ laser (Right, 10/08/2018); Breast cyst aspiration (Right, 02/13/2018); Laparoscopic vaginal hysterectomy with salpingectomy (Bilateral, 12/30/2019); Cystoscopy (N/A, 12/30/2019); and Breast biopsy (Right,  07/09/2022). Family History:  Her family history includes Breast cancer in her cousin, maternal aunt, maternal aunt, and maternal grandmother; Heart disease in her father and paternal grandfather; High Cholesterol in her mother; High blood pressure in her mother; Obesity in her mother.  REVIEW OF SYSTEMS  : All other systems reviewed and negative except where noted in the History of Present Illness.  PHYSICAL EXAM: BP 116/64 (BP Location: Left Arm, Patient Position: Sitting, Cuff Size: Normal)   Pulse 65   Ht  (1.575 m)   Wt 148 lb 8 oz (67.4 kg)   LMP 12/19/2019   SpO2 97%   BMI 27.16 kg/m  General Appearance: Well nourished, in no apparent distress. Head:   Normocephalic and atraumatic. Eyes:  sclerae anicteric,conjunctive pink  Respiratory: Respiratory effort normal, BS equal bilaterally without rales, rhonchi, wheezing. Cardio: RRR with no MRGs. Peripheral pulses intact.  Abdomen: Soft,  Obese ,active bowel sounds. mild tenderness in the epigastrium. Without guarding and Without rebound. No masses. Rectal: Not evaluated Musculoskeletal: Full ROM, Normal gait. Without edema. Skin:  Dry and intact without significant lesions or rashes Neuro: Alert and  oriented x4;  No focal deficits. Psych:  Cooperative. Normal mood and affect.    Doree Albee, PA-C 2:49 PM

## 2022-08-27 ENCOUNTER — Ambulatory Visit (INDEPENDENT_AMBULATORY_CARE_PROVIDER_SITE_OTHER)
Admission: RE | Admit: 2022-08-27 | Discharge: 2022-08-27 | Disposition: A | Discharge status: Home / Self Care | Payer: Medicaid Other | Source: Ambulatory Visit | Attending: Physician Assistant | Admitting: Physician Assistant

## 2022-08-27 ENCOUNTER — Encounter: Payer: Self-pay | Admitting: Physician Assistant

## 2022-08-27 ENCOUNTER — Ambulatory Visit (INDEPENDENT_AMBULATORY_CARE_PROVIDER_SITE_OTHER): Payer: Medicaid Other | Admitting: Physician Assistant

## 2022-08-27 VITALS — BP 116/64 | HR 65 | Ht 62.0 in | Wt 148.5 lb

## 2022-08-27 DIAGNOSIS — N941 Unspecified dyspareunia: Secondary | ICD-10-CM

## 2022-08-27 DIAGNOSIS — K920 Hematemesis: Secondary | ICD-10-CM

## 2022-08-27 DIAGNOSIS — K5904 Chronic idiopathic constipation: Secondary | ICD-10-CM | POA: Diagnosis not present

## 2022-08-27 DIAGNOSIS — R1013 Epigastric pain: Secondary | ICD-10-CM

## 2022-08-27 MED ORDER — PANTOPRAZOLE SODIUM 40 MG PO TBEC: 40.0000 mg | DELAYED_RELEASE_TABLET | Freq: Every day | ORAL | 3 refills | Status: DC

## 2022-08-27 NOTE — Patient Instructions (Addendum)
Your provider has requested that you have an abdominal x ray before leaving today. Please go to the basement floor to our Radiology department for the test.  You have been scheduled for an endoscopy. Please follow written instructions given to you at your visit today. If you use inhalers (even only as needed), please bring them with you on the day of your procedure.     Please take your proton pump inhibitor medication, protonix 40 mg once a day for 8 weeks.   Please take this medication 30 minutes to 1 hour before meals- this makes it more effective.  Avoid spicy and acidic foods Avoid fatty foods Limit your intake of coffee, tea, alcohol, and carbonated drinks Work to maintain a healthy weight Keep the head of the bed elevated at least 3 inches with blocks or a wedge pillow if you are having any nighttime symptoms Stay upright for 2 hours after eating Avoid meals and snacks three to four hours before bedtime  Linzess 72 *IBS-C patients may begin to experience relief from belly pain and overall abdominal symptoms (pain, discomfort, and bloating) in about 1 week,  with symptoms typically improving over 12 weeks.  Take at least 30 minutes before the first meal of the day on an empty stomach You can have a loose stool if you eat a high-fat breakfast. Give it at least 7 days, may have more bowel movements during that time.   The diarrhea should go away and you should start having normal, complete, full bowel movements.  It may be helpful to start treatment when you can be near the comfort of your own bathroom, such as a weekend.  After you are out we can send in a prescription if you did well, there is a prescription card  Here some information about pelvic floor dysfunction. This may be contributing to some of your symptoms. We will continue with our evaluation but I do want you to consider adding on fiber supplement with low-dose MiraLAX daily. We could also refer to pelvic floor  physical therapy.   Pelvic Floor Dysfunction, Female Pelvic floor dysfunction (PFD) is a condition that results when the group of muscles and connective tissues that support the organs in the pelvis (pelvic floor muscles) do not work well. These muscles and their connections form a sling that supports the colon and bladder. In women, they also support the uterus. PFD causes pelvic floor muscles to be too weak, too tight, or both. In PFD, muscle movements are not coordinated. This may cause bowel or bladder problems. It may also cause pain. What are the causes? This condition may be caused by an injury to the pelvic area or by a weakening of pelvic muscles. This often results from pregnancy and childbirth or other types of strain. In many cases, the exact cause is not known. What increases the risk? The following factors may make you more likely to develop this condition: Having chronic bladder tissue inflammation (interstitial cystitis). Being an older person. Being overweight. History of radiation treatment for cancer in the pelvic region. Previous pelvic surgery, such as removal of the uterus (hysterectomy). What are the signs or symptoms? Symptoms of this condition vary and may include: Bladder symptoms, such as: Trouble starting urination and emptying the bladder. Frequent urinary tract infections. Leaking urine when coughing, laughing, or exercising (stress incontinence). Having to pass urine urgently or frequently. Pain when passing urine. Bowel symptoms, such as: Constipation. Urgent or frequent bowel movements. Incomplete bowel movements. Painful bowel  movements. Leaking stool or gas. Unexplained genital or rectal pain. Genital or rectal muscle spasms. Low back pain. Other symptoms may include: A heavy, full, or aching feeling in the vagina. A bulge that protrudes into the vagina. Pain during or after sex. How is this diagnosed? This condition may be diagnosed based  on: Your symptoms and medical history. A physical exam. During the exam, your health care provider may check your pelvic muscles for tightness, spasm, pain, or weakness. This may include a rectal exam and a pelvic exam. In some cases, you may have diagnostic tests, such as: Electrical muscle function tests. Urine flow testing. X-ray tests of bowel function. Ultrasound of the pelvic organs. How is this treated? Treatment for this condition depends on the symptoms. Treatment options include: Physical therapy. This may include Kegel exercises to help relax or strengthen the pelvic floor muscles. Biofeedback. This type of therapy provides feedback on how tight your pelvic floor muscles are so that you can learn to control them. Internal or external massage therapy. A treatment that involves electrical stimulation of the pelvic floor muscles to help control pain (transcutaneous electrical nerve stimulation, or TENS). Sound wave therapy (ultrasound) to reduce muscle spasms. Medicines, such as: Muscle relaxants. Bladder control medicines. Surgery to reconstruct or support pelvic floor muscles may be an option if other treatments do not help. Follow these instructions at home: Activity Do your usual activities as told by your health care provider. Ask your health care provider if you should modify any activities. Do pelvic floor strengthening or relaxing exercises at home as told by your physical therapist. Lifestyle Maintain a healthy weight. Eat foods that are high in fiber, such as beans, whole grains, and fresh fruits and vegetables. Limit foods that are high in fat and processed sugars, such as fried or sweet foods. Manage stress with relaxation techniques such as yoga or meditation. General instructions If you have problems with leakage: Use absorbable pads or wear padded underwear. Wash frequently with mild soap. Keep your genital and anal area as clean and dry as possible. Ask your  health care provider if you should try a barrier cream to prevent skin irritation. Take warm baths to relieve pelvic muscle tension or spasms. Take over-the-counter and prescription medicines only as told by your health care provider. Keep all follow-up visits. How is this prevented? The cause of PFD is not always known, but there are a few things you can do to reduce the risk of developing this condition, including: Staying at a healthy weight. Getting regular exercise. Managing stress. Contact a health care provider if: Your symptoms are not improving with home care. You have signs or symptoms of PFD that get worse at home. You develop new signs or symptoms. You have signs of a urinary tract infection, such as: Fever. Chills. Increased urinary frequency. A burning feeling when urinating. You have not had a bowel movement in 3 days (constipation). Summary Pelvic floor dysfunction results when the muscles and connective tissues in your pelvic floor do not work well. These muscles and their connections form a sling that supports your colon and bladder. In women, they also support the uterus. PFD may be caused by an injury to the pelvic area or by a weakening of pelvic muscles. PFD causes pelvic floor muscles to be too weak, too tight, or a combination of both. Symptoms may vary from person to person. In most cases, PFD can be treated with physical therapies and medicines. Surgery may be an option  if other treatments do not help. This information is not intended to replace advice given to you by your health care provider. Make sure you discuss any questions you have with your health care provider. Document Revised: 08/30/2020 Document Reviewed: 08/30/2020 Elsevier Patient Education  2022 [employer]   Gastroparesis Please do small frequent meals like 4-6 meals a day.  Eat and drink liquids at separate times.  Avoid high fiber foods, cook your vegetables, avoid high fat food.   Suggest spreading protein throughout the day (greek yogurt, glucerna, soft meat, milk, eggs) Choose soft foods that you can mash with a fork When you are more symptomatic, change to pureed foods foods and liquids.  Consider reading "Living well with Gastroparesis" by Reuel Derby Gastroparesis is a condition in which food takes longer than normal to empty from the stomach. This condition is also known as delayed gastric emptying. It is usually a long-term (chronic) condition. There is no cure, but there are treatments and things that you can do at home to help relieve symptoms. Treating the underlying condition that causes gastroparesis can also help relieve symptoms What are the causes? In many cases, the cause of this condition is not known. Possible causes include: A hormone (endocrine) disorder, such as hypothyroidism or diabetes. A nervous system disease, such as Parkinson's disease or multiple sclerosis. Cancer, infection, or surgery that affects the stomach or vagus nerve. The vagus nerve runs from your chest, through your neck, and to the lower part of your brain. A connective tissue disorder, such as scleroderma. Certain medicines. What increases the risk? You are more likely to develop this condition if: You have certain disorders or diseases. These may include: An endocrine disorder. An eating disorder. Amyloidosis. Scleroderma. Parkinson's disease. Multiple sclerosis. Cancer or infection of the stomach or the vagus nerve. You have had surgery on your stomach or vagus nerve. You take certain medicines. You are female. What are the signs or symptoms? Symptoms of this condition include: Feeling full after eating very little or a loss of appetite. Nausea, vomiting, or heartburn. Bloating of your abdomen. Inconsistent blood sugar (glucose) levels on blood tests. Unexplained weight loss. Acid from the stomach coming up into the esophagus (gastroesophageal  reflux). Sudden tightening (spasm) of the stomach, which can be painful. Symptoms may come and go. Some people may not notice any symptoms. How is this diagnosed? This condition is diagnosed with tests, such as: Tests that check how long it takes food to move through the stomach and intestines. These tests include: Upper gastrointestinal (GI) series. For this test, you drink a liquid that shows up well on X-rays, and then X-rays are taken of your intestines. Gastric emptying scintigraphy. For this test, you eat food that contains a small amount of radioactive material, and then scans are taken. Wireless capsule GI monitoring system. For this test, you swallow a pill (capsule) that records information about how foods and fluid move through your stomach. Gastric manometry. For this test, a tube is passed down your throat and into your stomach to measure electrical and muscular activity. Endoscopy. For this test, a long, thin tube with a camera and light on the end is passed down your throat and into your stomach to check for problems in your stomach lining. Ultrasound. This test uses sound waves to create images of the inside of your body. This can help rule out gallbladder disease or pancreatitis as a cause of your symptoms. How is this treated? There is no cure for  this condition, but treatment and home care may relieve symptoms. Treatment may include: Treating the underlying cause. Managing your symptoms by making changes to your diet and exercise habits. Taking medicines to control nausea and vomiting and to stimulate stomach muscles. Getting food through a feeding tube in the hospital. This may be done in severe cases. Having surgery to insert a device called a gastric electrical stimulator into your body. This device helps improve stomach emptying and control nausea and vomiting. Follow these instructions at home: Take over-the-counter and prescription medicines only as told by your health  care provider. Follow instructions from your health care provider about eating or drinking restrictions. Your health care provider may recommend that you: Eat smaller meals more often. Eat low-fat foods. Eat low-fiber forms of high-fiber foods. For example, eat cooked vegetables instead of raw vegetables. Have only liquid foods instead of solid foods. Liquid foods are easier to digest. Drink enough fluid to keep your urine pale yellow. Exercise as often as told by your health care provider. Keep all follow-up visits. This is important. Contact a health care provider if you: Notice that your symptoms do not improve with treatment. Have new symptoms. Get help right away if you: Have severe pain in your abdomen that does not improve with treatment. Have nausea that is severe or does not go away. Vomit every time you drink fluids. Summary Gastroparesis is a long-term (chronic) condition in which food takes longer than normal to empty from the stomach. Symptoms include nausea, vomiting, heartburn, bloating of your abdomen, and loss of appetite. Eating smaller portions, low-fat foods, and low-fiber forms of high-fiber foods may help you manage your symptoms. Get help right away if you have severe pain in your abdomen. This information is not intended to replace advice given to you by your health care provider. Make sure you discuss any questions you have with your health care provider. Document Revised: 08/30/2019 Document Reviewed: 08/30/2019 Elsevier Patient Education  2021 [employer].

## 2022-08-27 NOTE — Progress Notes (Signed)
Agree with assessment and plan as outlined.  

## 2022-09-03 ENCOUNTER — Ambulatory Visit
Admission: RE | Admit: 2022-09-03 | Discharge: 2022-09-03 | Disposition: A | Discharge status: Home / Self Care | Payer: Medicaid Other | Source: Ambulatory Visit | Attending: Obstetrics and Gynecology | Admitting: Obstetrics and Gynecology

## 2022-09-03 DIAGNOSIS — Z1231 Encounter for screening mammogram for malignant neoplasm of breast: Secondary | ICD-10-CM | POA: Diagnosis not present

## 2022-09-04 DIAGNOSIS — Z419 Encounter for procedure for purposes other than remedying health state, unspecified: Secondary | ICD-10-CM | POA: Diagnosis not present

## 2022-09-05 ENCOUNTER — Other Ambulatory Visit: Payer: Self-pay | Admitting: Obstetrics and Gynecology

## 2022-09-05 DIAGNOSIS — R1032 Left lower quadrant pain: Secondary | ICD-10-CM | POA: Diagnosis not present

## 2022-09-05 DIAGNOSIS — Z01419 Encounter for gynecological examination (general) (routine) without abnormal findings: Secondary | ICD-10-CM | POA: Diagnosis not present

## 2022-09-05 DIAGNOSIS — L293 Anogenital pruritus, unspecified: Secondary | ICD-10-CM | POA: Diagnosis not present

## 2022-09-05 DIAGNOSIS — R928 Other abnormal and inconclusive findings on diagnostic imaging of breast: Secondary | ICD-10-CM | POA: Diagnosis not present

## 2022-09-05 DIAGNOSIS — N393 Stress incontinence (female) (male): Secondary | ICD-10-CM | POA: Diagnosis not present

## 2022-09-05 DIAGNOSIS — N941 Unspecified dyspareunia: Secondary | ICD-10-CM | POA: Diagnosis not present

## 2022-09-06 DIAGNOSIS — F331 Major depressive disorder, recurrent, moderate: Secondary | ICD-10-CM | POA: Diagnosis not present

## 2022-09-10 ENCOUNTER — Ambulatory Visit (AMBULATORY_SURGERY_CENTER): Payer: Medicaid Other | Admitting: Gastroenterology

## 2022-09-10 ENCOUNTER — Encounter: Payer: Self-pay | Admitting: Gastroenterology

## 2022-09-10 VITALS — BP 104/62 | HR 54 | Temp 97.7°F | Resp 10 | Ht 62.0 in | Wt 148.0 lb

## 2022-09-10 DIAGNOSIS — K920 Hematemesis: Secondary | ICD-10-CM | POA: Diagnosis not present

## 2022-09-10 DIAGNOSIS — B9681 Helicobacter pylori [H. pylori] as the cause of diseases classified elsewhere: Secondary | ICD-10-CM

## 2022-09-10 DIAGNOSIS — K2951 Unspecified chronic gastritis with bleeding: Secondary | ICD-10-CM

## 2022-09-10 DIAGNOSIS — R109 Unspecified abdominal pain: Secondary | ICD-10-CM

## 2022-09-10 MED ORDER — SODIUM CHLORIDE 0.9 % IV SOLN
500.0000 mL | Freq: Once | INTRAVENOUS | Status: DC
Start: 2022-09-10 — End: 2022-09-10

## 2022-09-10 NOTE — Progress Notes (Signed)
Pt's states no medical or surgical changes since previsit or office visit. 

## 2022-09-10 NOTE — Progress Notes (Signed)
History and Physical Interval Note: seen 08/27/22 - no interval changes. On protonix 40mg  / day and pepcid, and Zofran. No further bleeding since office visit. MEdications are helping, she is improved, but not resolved. No prior EGD. She wishes to proceed.     09/10/2022 9:40 AM  Carol Welch  has presented today for endoscopic procedure(s), with the diagnosis of  Encounter Diagnoses  Name Primary?   Abdominal pain, unspecified abdominal location Yes   Hematemesis with nausea   .  The various methods of evaluation and treatment have been discussed with the patient and/or family. After consideration of risks, benefits and other options for treatment, the patient has consented to  the endoscopic procedure(s).   The patient's history has been reviewed, patient examined, no change in status, stable for surgery.  I have reviewed the patient's chart and labs.  Questions were answered to the patient's satisfaction.    Harlin Rain, MD [facility]

## 2022-09-10 NOTE — Progress Notes (Signed)
To pacu, VSS. Report to [email or web address] 

## 2022-09-10 NOTE — Patient Instructions (Addendum)
Continue all medications including protonix and Zofran.  AVOID NSAIDS  YOU HAD AN ENDOSCOPIC PROCEDURE TODAY AT THE [facility]:   Refer to the procedure report that was given to you for any specific questions about what was found during the examination.  If the procedure report does not answer your questions, please call your gastroenterologist to clarify.  If you requested that your care partner not be given the details of your procedure findings, then the procedure report has been included in a sealed envelope for you to review at your convenience later.  YOU SHOULD EXPECT: Some feelings of bloating in the abdomen. Passage of more gas than usual.  Walking can help get rid of the air that was put into your GI tract during the procedure and reduce the bloating. If you had a lower endoscopy (such as a colonoscopy or flexible sigmoidoscopy) you may notice spotting of blood in your stool or on the toilet paper. If you underwent a bowel prep for your procedure, you may not have a normal bowel movement for a few days.  Please Note:  You might notice some irritation and congestion in your nose or some drainage.  This is from the oxygen used during your procedure.  There is no need for concern and it should clear up in a day or so.  SYMPTOMS TO REPORT IMMEDIATELY:  Following upper endoscopy (EGD)  Vomiting of blood or coffee ground material  New chest pain or pain under the shoulder blades  Painful or persistently difficult swallowing  New shortness of breath  Fever of 100F or higher  Black, tarry-looking stools  For urgent or emergent issues, a gastroenterologist can be reached at any hour by calling [phone]. Do not use MyChart messaging for urgent concerns.    DIET:  We do recommend a small meal at first, but then you may proceed to your regular diet.  Drink plenty of fluids but you should avoid alcoholic beverages for 24 hours.  ACTIVITY:  You should plan to take it easy  for the rest of today and you should NOT DRIVE or use heavy machinery until tomorrow (because of the sedation medicines used during the test).    FOLLOW UP: Our staff will call the number listed on your records the next business day following your procedure.  We will call around 7:15- 8:00 am to check on you and address any questions or concerns that you may have regarding the information given to you following your procedure. If we do not reach you, we will leave a message.     If any biopsies were taken you will be contacted by phone or by letter within the next 1-3 weeks.  Please call us at [phone] if you have not heard about the biopsies in 3 weeks.    SIGNATURES/CONFIDENTIALITY: You and/or your care partner have signed paperwork which will be entered into your electronic medical record.  These signatures attest to the fact that that the information above on your After Visit Summary has been reviewed and is understood.  Full responsibility of the confidentiality of this discharge information lies with you and/or your care-partner.

## 2022-09-10 NOTE — Op Note (Signed)
[facility] Patient Name: Carol Welch Procedure Date: 09/10/2022 9:31 AM MRN: [id number] Endoscopist: Viviann Spare P. Adela Lank , MD, [id number] Age: 47 Referring MD:  Date of Birth: [birth year] Gender: Female Account #: [id number] Procedure:                Upper GI endoscopy Indications:              Epigastric abdominal pain, nausea with vomiting,                            history of hematemesis - improved on protonix and                            Zofran but not resolved Medicines:                Monitored Anesthesia Care Procedure:                Pre-Anesthesia Assessment:                           - Prior to the procedure, a History and Physical                            was performed, and patient medications and                            allergies were reviewed. The patient's tolerance of                            previous anesthesia was also reviewed. The risks                            and benefits of the procedure and the sedation                            options and risks were discussed with the patient.                            All questions were answered, and informed consent                            was obtained. Prior Anticoagulants: The patient has                            taken no anticoagulant or antiplatelet agents. ASA                            Grade Assessment: II - A patient with mild systemic                            disease. After reviewing the risks and benefits,                            the patient was deemed in satisfactory condition to  undergo the procedure.                           After obtaining informed consent, the endoscope was                            passed under direct vision. Throughout the                            procedure, the patient's blood pressure, pulse, and                            oxygen saturations were monitored continuously. The                            Olympus Scope [id number]  was introduced through the                            mouth, and advanced to the second part of duodenum.                            The upper GI endoscopy was accomplished without                            difficulty. The patient tolerated the procedure                            well. Scope In: Scope Out: Findings:                 Esophagogastric landmarks were identified: the                            Z-line was found at 37 cm, the gastroesophageal                            junction was found at 37 cm and the upper extent of                            the gastric folds was found at 37 cm from the                            incisors.                           There was a benign gastric inlet patch in the                            proximal esophagus. The exam of the esophagus was                            otherwise normal.                           The entire examined stomach was normal. Biopsies  were taken with a cold forceps for Helicobacter                            pylori testing.                           The examined duodenum was normal. Complications:            No immediate complications. Estimated blood loss:                            Minimal. Estimated Blood Loss:     Estimated blood loss was minimal. Impression:               - Esophagogastric landmarks identified.                           - Benign gastric inlet patch of the proximal                            esophagus.                           - Normal esophagus otherwise.                           - Normal stomach. Biopsied.                           - Normal examined duodenum.                           Bleeding could have been due to Mallory weiss tear                            that has since healed, no concerning pathology on                            this exam. Recommendation:           - Patient has a contact number available for                            emergencies. The signs  and symptoms of potential                            delayed complications were discussed with the                            patient. Return to normal activities tomorrow.                            Written discharge instructions were provided to the                            patient.                           -  Resume previous diet.                           - Continue present medications.                           - Continue protonix and Zofran for now.                           - Avoid NSAIDs                           - Await pathology results. If symptoms persist or                            fail to resolve, please contact us so we can                            further evaluate Viviann Spare P. Jeraline Marcinek, MD 09/10/2022 10:02:35 AM This report has been signed electronically.

## 2022-09-10 NOTE — Progress Notes (Signed)
Called to room to assist during endoscopic procedure.  Patient ID and intended procedure confirmed with present staff. Received instructions for my participation in the procedure from the performing physician.  

## 2022-09-11 ENCOUNTER — Telehealth: Payer: Self-pay

## 2022-09-11 NOTE — Telephone Encounter (Signed)
Attempted f/u call. No answer, left VM. 

## 2022-09-13 DIAGNOSIS — F331 Major depressive disorder, recurrent, moderate: Secondary | ICD-10-CM | POA: Diagnosis not present

## 2022-09-17 ENCOUNTER — Other Ambulatory Visit: Payer: Self-pay

## 2022-09-17 MED ORDER — PANTOPRAZOLE SODIUM 40 MG PO TBEC: 40.0000 mg | DELAYED_RELEASE_TABLET | Freq: Two times a day (BID) | ORAL | 0 refills | Status: DC

## 2022-09-17 MED ORDER — LINACLOTIDE 72 MCG PO CAPS: 72.0000 ug | ORAL_CAPSULE | Freq: Every day | ORAL | 3 refills | Status: DC

## 2022-09-17 MED ORDER — AMOXICILLIN 500 MG PO TABS: 1000.0000 mg | ORAL_TABLET | Freq: Two times a day (BID) | ORAL | 0 refills | Status: AC

## 2022-09-17 MED ORDER — METRONIDAZOLE 500 MG PO TABS: 500.0000 mg | ORAL_TABLET | Freq: Two times a day (BID) | ORAL | 0 refills | Status: AC

## 2022-09-17 MED ORDER — CLARITHROMYCIN 500 MG PO TABS: 500.0000 mg | ORAL_TABLET | Freq: Two times a day (BID) | ORAL | 0 refills | Status: AC

## 2022-09-18 ENCOUNTER — Ambulatory Visit (INDEPENDENT_AMBULATORY_CARE_PROVIDER_SITE_OTHER): Payer: Medicaid Other | Admitting: Family Medicine

## 2022-09-18 ENCOUNTER — Encounter (INDEPENDENT_AMBULATORY_CARE_PROVIDER_SITE_OTHER): Payer: Self-pay | Admitting: Family Medicine

## 2022-09-18 VITALS — BP 109/75 | HR 67 | Temp 98.2°F | Ht 62.0 in | Wt 142.0 lb

## 2022-09-18 DIAGNOSIS — A048 Other specified bacterial intestinal infections: Secondary | ICD-10-CM | POA: Insufficient documentation

## 2022-09-18 DIAGNOSIS — E559 Vitamin D deficiency, unspecified: Secondary | ICD-10-CM

## 2022-09-18 DIAGNOSIS — E669 Obesity, unspecified: Secondary | ICD-10-CM

## 2022-09-18 DIAGNOSIS — B9681 Helicobacter pylori [H. pylori] as the cause of diseases classified elsewhere: Secondary | ICD-10-CM | POA: Diagnosis not present

## 2022-09-18 DIAGNOSIS — Z6825 Body mass index (BMI) 25.0-25.9, adult: Secondary | ICD-10-CM

## 2022-09-18 DIAGNOSIS — Z6826 Body mass index (BMI) 26.0-26.9, adult: Secondary | ICD-10-CM

## 2022-09-18 MED ORDER — ERGOCALCIFEROL 1.25 MG (50000 UT) PO CAPS: 50000.0000 [IU] | ORAL_CAPSULE | ORAL | 0 refills | Status: DC

## 2022-09-23 ENCOUNTER — Inpatient Hospital Stay: Payer: Medicaid Other

## 2022-09-23 ENCOUNTER — Encounter: Payer: Self-pay | Admitting: Genetic Counselor

## 2022-09-23 ENCOUNTER — Other Ambulatory Visit: Payer: Self-pay

## 2022-09-23 ENCOUNTER — Inpatient Hospital Stay: Payer: Medicaid Other | Attending: Genetic Counselor | Admitting: Genetic Counselor

## 2022-09-23 DIAGNOSIS — Z803 Family history of malignant neoplasm of breast: Secondary | ICD-10-CM | POA: Diagnosis not present

## 2022-09-23 NOTE — Progress Notes (Unsigned)
Chief Complaint:   OBESITY Carol Welch is here to discuss her progress with her obesity treatment plan along with follow-up of her obesity related diagnoses. Carol Welch is on keeping a food journal and adhering to recommended goals of 1000-1200 calories and 80+ grams of protein and states she is following her eating plan approximately 96% of the time. Carol Welch states she is walking for 20-30 minutes 3-4 times per week.  Today's visit was #: 17 Starting weight: 167 lbs Starting date: 08/08/2021 Today's weight: 142 lbs Today's date: 09/18/2022 Total lbs lost to date: 25 Total lbs lost since last in-office visit: 1  Interim History: Carol Welch continues to do very well with her weight loss and she is getting closer to her maintenance goal. She is doing well with meeting her protein goals.   Subjective:   1. Vitamin D insufficiency Carol Welch is on Vitamin D, and her level is almost at goal.   2. H. pylori infection Carol Welch has recurrent nausea and vomiting. She was recently diagnosed with H. Pylori and he will be starting her treatment soon.   Assessment/Plan:   1. Vitamin D insufficiency Carol Welch will continue prescription Vitamin D, and we will refill for 1 month.   - ergocalciferol (VITAMIN D2) 1.25 MG (50000 UT) capsule; Take 1 capsule (50,000 Units total) by mouth once a week.  Dispense: 4 capsule; Refill: 0  2. H. pylori infection Carol Welch was encouraged to increase probiotics like yogurt to help antibiotics induced candidiasis.   3. BMI 26.0-26.9,adult  4. Obesity, Beginning BMI 30.54 Carol Welch is currently in the action stage of change. As such, her goal is to continue with weight loss efforts. She has agreed to keeping a food journal and adhering to recommended goals of 1000-1200 calories and 80+ grams of protein daily.   Exercise goals: As is.   Behavioral modification strategies: increasing lean protein intake.  Carol Welch has agreed to follow-up with our clinic in 4 weeks. She was  informed of the importance of frequent follow-up visits to maximize her success with intensive lifestyle modifications for her multiple health conditions.   Objective:   Blood pressure 109/75, pulse 67, temperature 98.2 F (36.8 C), height 5\' 2"  (1.575 m), weight 142 lb (64.4 kg), last menstrual period 12/19/2019, SpO2 100 %. Body mass index is 25.97 kg/m.  Lab Results  Component Value Date   CREATININE 0.70 07/17/2022   BUN 20 07/17/2022   NA 142 07/17/2022   K 4.4 07/17/2022   CL 105 07/17/2022   CO2 23 07/17/2022   Lab Results  Component Value Date   ALT 10 07/17/2022   AST 18 07/17/2022   ALKPHOS 51 07/17/2022   BILITOT 0.3 07/17/2022   Lab Results  Component Value Date   HGBA1C 5.7 (H) 07/17/2022   HGBA1C 5.6 08/08/2021   Lab Results  Component Value Date   INSULIN 5.5 07/17/2022   INSULIN 5.7 08/08/2021   Lab Results  Component Value Date   TSH 0.499 07/08/2022   Lab Results  Component Value Date   CHOL 237 (H) 07/17/2022   HDL 66 07/17/2022   LDLCALC 155 (H) 07/17/2022   TRIG 91 07/17/2022   Lab Results  Component Value Date   VD25OH 49.7 07/17/2022   VD25OH 48.0 02/11/2022   VD25OH 37.3 08/08/2021   Lab Results  Component Value Date   WBC 5.8 11/01/2021   HGB 12.9 11/01/2021   HCT 38.1 11/01/2021   MCV 89 11/01/2021   PLT 223 11/01/2021   No  results found for: "IRON", "TIBC", "FERRITIN"  Attestation Statements:   Reviewed by clinician on day of visit: allergies, medications, problem list, medical history, surgical history, family history, social history, and previous encounter notes.   I, Burt Knack, am acting as transcriptionist for Quillian Quince, MD.  I have reviewed the above documentation for accuracy and completeness, and I agree with the above. -  Quillian Quince, MD

## 2022-09-23 NOTE — Progress Notes (Signed)
REFERRING PROVIDER: Sherian Rein, MD [address] [place],  [place] [address]  PRIMARY PROVIDER:  Tiffany Kocher, DO  PRIMARY REASON FOR VISIT:  Encounter Diagnosis  Name Primary?   Family history of breast cancer Yes    HISTORY OF PRESENT ILLNESS:   Ms. Carol Welch, a 47 y.o. female, was seen for a [place] cancer genetics consultation at the request of Dr. Ellyn Hack due to a family history of breast cancer.  Ms. Espeland presents to clinic today to discuss the possibility of a hereditary predisposition to cancer, to discuss genetic testing, and to further clarify her future cancer risks, as well as potential cancer risks for family members.    Ms. Schlies is a 47 y.o. female with no personal history of cancer.    CANCER HISTORY:  Oncology History   No history exists.    RISK FACTORS:  Mammogram within the last year: yes; category c density  Number of breast biopsies: R fibroadoma.  Colonoscopy: yes;  2022; f/u in 10 years . Hysterectomy: yes.  Ovaries intact: yes.  Menarche was at age 48.  First live birth at age 67.  OCP use for approximately 5 years.  HRT use: 0 years. Dermatology screening: no  Past Medical History:  Diagnosis Date   Anal fissure    Colitis    Depression    Edema, lower extremity    Fatty liver    Hypothyroidism    Low blood pressure    PONV (postoperative nausea and vomiting)    SVD (spontaneous vaginal delivery) 06/30/2015   Vitamin D deficiency       FAMILY HISTORY:  We obtained a detailed, 4-generation family history.  Significant diagnoses are listed below: Family History  Problem Relation Age of Onset   Breast cancer Maternal Aunt        dx 70s   Breast cancer Maternal Aunt        d. 30s   Esophageal cancer Paternal Aunt        d. 12   Stomach cancer Paternal Aunt        d.74   Breast cancer Maternal Grandmother        d. 44   Breast cancer Cousin        four maternal female cousins w/ breast ca before age 40       Ms. Freeze stated that a maternal cousin with a breast cancer history may have had genetic testing previously.  She is unsure of what the result was. Ms. Ryerson is unaware of other previous family history of genetic testing for hereditary cancer risks.  Other relatives are unavailable for genetic testing at this time. There is no reported Ashkenazi Jewish ancestry. There is no known consanguinity.  GENETIC COUNSELING ASSESSMENT: Ms. Huso is a 47 y.o. female with a family history of breast which is somewhat suggestive of a hereditary cancer syndrome and predisposition to cancer given the presence of premenopausal breast cancer in multiple generations. We, therefore, discussed and recommended the following at today's visit.   DISCUSSION: We discussed that 5 - 10% of cancer is hereditary, with most cases of hereditary breast cancer associated with mutations in BRCA1/2.  There are other genes that can be associated with hereditary breast cancer syndromes.  We discussed that testing is beneficial for several reasons, including knowing about other cancer risks, identifying potential screening and risk-reduction options that may be appropriate, and to understanding if other family members could be at risk for cancer and  allowing them to undergo genetic testing.  We reviewed the characteristics, features and inheritance patterns of hereditary cancer syndromes. We also discussed genetic testing, including the appropriate family members to test, the process of testing, insurance coverage and turn-around-time for results. We discussed the implications of a negative, positive, and variant of uncertain significant result. We discussed that negative results would be uninformative given that Ms. Panciera does not have a personal history of cancer. We recommended Ms. Selles pursue genetic testing for a panel that contains genes associated with breast cancer and other cancers.  The Multi-Cancer + RNA Panel offered by [facility]  includes sequencing and/or deletion/duplication analysis of the following 70 genes:  AIP*, ALK, APC*, ATM*, AXIN2*, BAP1*, BARD1*, BLM*, BMPR1A*, BRCA1*, BRCA2*, BRIP1*, CDC73*, CDH1*, CDK4, CDKN1B*, CDKN2A, CHEK2*, CTNNA1*, DICER1*, EPCAM (del/dup only), EGFR, FH*, FLCN*, GREM1 (promoter dup only), HOXB13, KIT, LZTR1, MAX*, MBD4, MEN1*, MET, MITF, MLH1*, MSH2*, MSH3*, MSH6*, MUTYH*, NF1*, NF2*, NTHL1*, PALB2*, PDGFRA, PMS2*, POLD1*, POLE*, POT1*, PRKAR1A*, PTCH1*, PTEN*, RAD51C*, RAD51D*, RB1*, RET, SDHA* (sequencing only), SDHAF2*, SDHB*, SDHC*, SDHD*, SMAD4*, SMARCA4*, SMARCB1*, SMARCE1*, STK11*, SUFU*, TMEM127*, TP53*, TSC1*, TSC2*, VHL*. RNA analysis is performed for * genes.  Based on Ms. Kowaleski's family history of breast cancer, she meets medical criteria for genetic testing.   We discussed the Genetic Information Non-Discrimination Act (GINA) of 2008, which helps protect individuals against genetic discrimination based on their genetic test results.  It impacts both health insurance and employment.  With health insurance, it protects against genetic test results being used for increased premiums or policy termination. For employment, it protects against hiring, firing and promoting decisions based on genetic test results.  GINA does not apply to those in the [employer], those who work for companies with less than 15 employees, and new life insurance or long-term disability insurance policies.  Health status due to a cancer diagnosis is not protected under GINA.  PLAN: After considering the risks, benefits, and limitations, Ms. Carrico provided informed consent to pursue genetic testing and the blood sample was sent to [facility] for analysis of the Multi-Cancer +RNA Panel. Results should be available within approximately 3 weeks' time, at which point they will be disclosed by telephone to Ms. Musleh, as will any additional recommendations warranted by these results. Ms. Billett will receive a summary of  her genetic counseling visit and a copy of her results once available. This information will also be available in Epic.    Ms. Wisener's questions were answered to her satisfaction today. Our contact information was provided should additional questions or concerns arise. Thank you for the referral and allowing [place] to share in the care of your patient.   Jawaun Celmer M. Rennie Plowman, MS, [facility] Genetic Counselor [email or web address] (P) [phone]   The patient was seen for a total of 30 minutes in face-to-face genetic counseling.  The patient was seen alone.  Drs. Pamelia Hoit and/or Mosetta Putt were available to discuss this case as needed.  _______________________________________________________________________ For Office Staff:  Number of people involved in session: 1 Was an Intern/ student involved with case: no

## 2022-09-24 ENCOUNTER — Ambulatory Visit
Admission: RE | Admit: 2022-09-24 | Discharge: 2022-09-24 | Disposition: A | Discharge status: Home / Self Care | Payer: Medicaid Other | Source: Ambulatory Visit | Attending: Obstetrics and Gynecology | Admitting: Obstetrics and Gynecology

## 2022-09-24 ENCOUNTER — Ambulatory Visit: Admission: RE | Admit: 2022-09-24 | Payer: Medicaid Other | Source: Ambulatory Visit

## 2022-09-24 DIAGNOSIS — R922 Inconclusive mammogram: Secondary | ICD-10-CM | POA: Diagnosis not present

## 2022-09-24 DIAGNOSIS — N63 Unspecified lump in unspecified breast: Secondary | ICD-10-CM | POA: Diagnosis not present

## 2022-09-24 DIAGNOSIS — R92333 Mammographic heterogeneous density, bilateral breasts: Secondary | ICD-10-CM | POA: Diagnosis not present

## 2022-09-24 DIAGNOSIS — R928 Other abnormal and inconclusive findings on diagnostic imaging of breast: Secondary | ICD-10-CM

## 2022-09-24 DIAGNOSIS — N6313 Unspecified lump in the right breast, lower outer quadrant: Secondary | ICD-10-CM | POA: Diagnosis not present

## 2022-10-01 ENCOUNTER — Other Ambulatory Visit (HOSPITAL_COMMUNITY)
Admission: RE | Admit: 2022-10-01 | Discharge: 2022-10-01 | Disposition: A | Discharge status: Home / Self Care | Payer: Medicaid Other | Source: Ambulatory Visit | Attending: Family Medicine | Admitting: Family Medicine

## 2022-10-01 ENCOUNTER — Ambulatory Visit (INDEPENDENT_AMBULATORY_CARE_PROVIDER_SITE_OTHER): Payer: Medicaid Other | Admitting: Student

## 2022-10-01 VITALS — BP 112/64 | HR 71 | Ht 62.0 in | Wt 148.2 lb

## 2022-10-01 DIAGNOSIS — N898 Other specified noninflammatory disorders of vagina: Secondary | ICD-10-CM

## 2022-10-01 DIAGNOSIS — R300 Dysuria: Secondary | ICD-10-CM

## 2022-10-01 DIAGNOSIS — E039 Hypothyroidism, unspecified: Secondary | ICD-10-CM | POA: Diagnosis not present

## 2022-10-01 DIAGNOSIS — I8393 Asymptomatic varicose veins of bilateral lower extremities: Secondary | ICD-10-CM

## 2022-10-01 LAB — POCT URINALYSIS DIP (MANUAL ENTRY)
Bilirubin, UA: NEGATIVE
Blood, UA: NEGATIVE
Glucose, UA: NEGATIVE mg/dL
Ketones, POC UA: NEGATIVE mg/dL
Nitrite, UA: NEGATIVE
Protein Ur, POC: NEGATIVE mg/dL
Spec Grav, UA: >=1.03 — AB (ref 1.010–1.025)
Urobilinogen, UA: 0.2 E.U./dL
pH, UA: 5.5 (ref 5.0–8.0)

## 2022-10-01 LAB — POCT WET PREP (WET MOUNT)
Clue Cells Wet Prep Whiff POC: NEGATIVE
Trichomonas Wet Prep HPF POC: ABSENT

## 2022-10-01 LAB — POCT UA - MICROSCOPIC ONLY
Epithelial cells, urine per micros: >20
RBC, Urine, Miroscopic: NONE SEEN (ref 0–2)
WBC, Ur, HPF, POC: NONE SEEN (ref 0–5)

## 2022-10-01 MED ORDER — FLUCONAZOLE 150 MG PO TABS
150.0000 mg | ORAL_TABLET | Freq: Once | ORAL | 0 refills | Status: AC
Start: 2022-10-01 — End: 2022-10-01

## 2022-10-01 MED ORDER — LEVOTHYROXINE SODIUM 75 MCG PO TABS
ORAL_TABLET | ORAL | 3 refills | Status: DC
Start: 2022-10-01 — End: 2023-05-08

## 2022-10-01 NOTE — Progress Notes (Signed)
  SUBJECTIVE:   CHIEF COMPLAINT / HPI:   Burning, itching of the outside of the vagina, dysuria, urinary frequency x 4 days. She has been on antibiotics for the last 10 days from GI for H. Pylori.  Requesting STD testing as well as she is having some vaginal discharge.  Additionally she is requesting refills of her thyroid medication and referral back to vascular surgeon for her spider veins that have been treated in the past.  PERTINENT  PMH / PSH: MDD, hypothyroidism, vaginal hysterectomy  Patient Care Team: Tiffany Kocher, DO as PCP - General (Family Medicine) OBJECTIVE:  BP 112/64   Pulse 71   Ht 5\' 2"  (1.575 m)   Wt 148 lb 3.2 oz (67.2 kg)   LMP 12/19/2019   SpO2 99%   BMI 27.11 kg/m  Pelvic exam: VULVA: normal appearing vulva with no masses, tenderness or lesions, VAGINA: vaginal discharge - white and creamy, DNA probe for chlamydia and GC obtained, exam chaperoned by Georges Lynch.   ASSESSMENT/PLAN:  Vaginal itching Assessment & Plan: Point-of-care UA dipstick unremarkable, wet prep with moderate yeast, UA with few yeast.  Orders: -     POCT Wet Prep Mellody [place]) -     Cervicovaginal ancillary only -     Fluconazole; Take 1 tablet (150 mg total) by mouth once for 1 dose.  Dispense: 1 tablet; Refill: 0  Dysuria -     POCT urinalysis dipstick -     POCT UA - Microscopic Only  Acquired hypothyroidism -     Levothyroxine Sodium; TAKE 1 TABLET(75 MCG) BY MOUTH DAILY  Dispense: 90 tablet; Refill: 3  Spider veins of both lower extremities -     Ambulatory referral to Vascular Surgery  Return if symptoms worsen or fail to improve. Shelby Mattocks, DO 10/01/2022, 2:04 PM PGY-2, [facility]

## 2022-10-01 NOTE — Patient Instructions (Addendum)
It was great to see you today! Thank you for choosing [facility] for your primary care. Carol Welch was seen for burning and itching.  Today we addressed: Your urine looks clean although little dehydrated.  I will contact you regarding the results of your swab.  Given you have had several yeast infections in the last year, I would recommend planning a visit with your PCP to discuss chronic management of this.  I recommend avoiding fragranced soaps as that may irritate skin more often.  If you haven't already, sign up for My Chart to have easy access to your labs results, and communication with your primary care physician.  We are checking some labs today. If they are abnormal, I will call you. If they are normal, I will send you a MyChart message (if it is active) or a letter in the mail. If you do not hear about your labs in the next 2 weeks, please call the office.  Please arrive 15 minutes before your appointment to ensure smooth check in process.  We appreciate your efforts in making this happen.  Thank you for allowing me to participate in your care, Shelby Mattocks, DO 10/01/2022, 9:28 AM PGY-2, [facility]

## 2022-10-01 NOTE — Assessment & Plan Note (Signed)
Point-of-care UA dipstick unremarkable, wet prep with moderate yeast, UA with few yeast.

## 2022-10-02 ENCOUNTER — Telehealth: Payer: Self-pay | Admitting: Genetic Counselor

## 2022-10-02 ENCOUNTER — Ambulatory Visit: Payer: Medicaid Other | Admitting: Physician Assistant

## 2022-10-02 ENCOUNTER — Encounter: Payer: Self-pay | Admitting: Genetic Counselor

## 2022-10-02 DIAGNOSIS — Z1379 Encounter for other screening for genetic and chromosomal anomalies: Secondary | ICD-10-CM | POA: Insufficient documentation

## 2022-10-02 DIAGNOSIS — Z1501 Genetic susceptibility to malignant neoplasm of breast: Secondary | ICD-10-CM | POA: Insufficient documentation

## 2022-10-02 DIAGNOSIS — F331 Major depressive disorder, recurrent, moderate: Secondary | ICD-10-CM | POA: Diagnosis not present

## 2022-10-02 LAB — CERVICOVAGINAL ANCILLARY ONLY
Chlamydia: NEGATIVE
Comment: NEGATIVE
Comment: NEGATIVE
Comment: NORMAL
Neisseria Gonorrhea: NEGATIVE
Trichomonas: NEGATIVE

## 2022-10-02 NOTE — Telephone Encounter (Signed)
Contacted patient in attempt to disclose results of genetic testing.  LVM with contact information requesting a call back.  

## 2022-10-04 DIAGNOSIS — R1032 Left lower quadrant pain: Secondary | ICD-10-CM | POA: Diagnosis not present

## 2022-10-04 NOTE — Telephone Encounter (Signed)
Contacted patient in attempt to disclose results of genetic testing.  LVM with contact information requesting a call back.  Second attempt.  

## 2022-10-05 DIAGNOSIS — Z419 Encounter for procedure for purposes other than remedying health state, unspecified: Secondary | ICD-10-CM | POA: Diagnosis not present

## 2022-10-07 ENCOUNTER — Telehealth: Payer: Self-pay | Admitting: Genetic Counselor

## 2022-10-07 NOTE — Telephone Encounter (Signed)
Discussed positive CDKN2A result.  Increased risk for pancreatic cancer and melanoma.  Briefly discussed management and family implications.  F/u appt scheduled 6/5 at 1pm.

## 2022-10-09 ENCOUNTER — Telehealth: Payer: Medicaid Other | Admitting: Family

## 2022-10-09 ENCOUNTER — Encounter (INDEPENDENT_AMBULATORY_CARE_PROVIDER_SITE_OTHER): Payer: Self-pay | Admitting: Family Medicine

## 2022-10-09 ENCOUNTER — Other Ambulatory Visit: Payer: Self-pay

## 2022-10-09 ENCOUNTER — Encounter: Payer: Self-pay | Admitting: Genetic Counselor

## 2022-10-09 ENCOUNTER — Ambulatory Visit (INDEPENDENT_AMBULATORY_CARE_PROVIDER_SITE_OTHER): Payer: Medicaid Other | Admitting: Family Medicine

## 2022-10-09 ENCOUNTER — Inpatient Hospital Stay: Payer: Medicaid Other | Attending: Genetic Counselor | Admitting: Genetic Counselor

## 2022-10-09 VITALS — BP 106/70 | HR 66 | Temp 98.6°F | Ht 62.0 in | Wt 142.0 lb

## 2022-10-09 DIAGNOSIS — Z6825 Body mass index (BMI) 25.0-25.9, adult: Secondary | ICD-10-CM | POA: Diagnosis not present

## 2022-10-09 DIAGNOSIS — E559 Vitamin D deficiency, unspecified: Secondary | ICD-10-CM

## 2022-10-09 DIAGNOSIS — Z803 Family history of malignant neoplasm of breast: Secondary | ICD-10-CM

## 2022-10-09 DIAGNOSIS — Z1509 Genetic susceptibility to other malignant neoplasm: Secondary | ICD-10-CM

## 2022-10-09 DIAGNOSIS — Z711 Person with feared health complaint in whom no diagnosis is made: Secondary | ICD-10-CM | POA: Diagnosis not present

## 2022-10-09 DIAGNOSIS — E669 Obesity, unspecified: Secondary | ICD-10-CM | POA: Diagnosis not present

## 2022-10-09 DIAGNOSIS — B370 Candidal stomatitis: Secondary | ICD-10-CM | POA: Diagnosis not present

## 2022-10-09 DIAGNOSIS — Z6826 Body mass index (BMI) 26.0-26.9, adult: Secondary | ICD-10-CM

## 2022-10-09 DIAGNOSIS — Z1379 Encounter for other screening for genetic and chromosomal anomalies: Secondary | ICD-10-CM

## 2022-10-09 MED ORDER — ERGOCALCIFEROL 1.25 MG (50000 UT) PO CAPS: 50000.0000 [IU] | ORAL_CAPSULE | ORAL | 0 refills | Status: DC

## 2022-10-09 MED ORDER — NYSTATIN 100000 UNIT/ML MT SUSP: 5.0000 mL | Freq: Four times a day (QID) | OROMUCOSAL | 0 refills | Status: DC

## 2022-10-09 NOTE — Progress Notes (Signed)
GENETIC TEST RESULTS  Patient Name: Carol Welch Patient Age: 47 y.o. Encounter Date: 10/09/2022  Referring Provider: Sherian Rein, MD  Ms. Pline was seen in the Cancer Genetics clinic on Sep 23, 2022 due to a family history of breast cancer and concern regarding a hereditary predisposition to cancer in the family. Please refer to the prior Genetics clinic note for more information regarding Ms. Due's medical and family histories and our assessment at the time.   FAMILY HISTORY:  We obtained a detailed, 4-generation family history.  Significant diagnoses are listed below:      Family History  Problem Relation Age of Onset   Breast cancer Maternal Aunt          dx 11s   Breast cancer Maternal Aunt          d. 30s   Esophageal cancer Paternal Aunt          d. 58   Stomach cancer Paternal Aunt          d.74   Breast cancer Maternal Grandmother          d. 29   Breast cancer Cousin          four maternal female cousins w/ breast ca before age 62         Ms. Ashman stated that a maternal cousin with a breast cancer history may have had genetic testing previously.  She is unsure of what the result was. Ms. Romig is unaware of other previous family history of genetic testing for hereditary cancer risks.  Other relatives are unavailable for genetic testing at this time. There is no reported Ashkenazi Jewish ancestry. There is no known consanguinity.   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  No other deleterious variants were detected in the [facility] Multi-Cancer +RNA Panel.  The Multi-Cancer + RNA Panel offered by [facility] includes sequencing and/or deletion/duplication analysis of the following 70 genes:  AIP*, ALK, APC*, ATM*, AXIN2*, BAP1*, BARD1*, BLM*, BMPR1A*, BRCA1*, BRCA2*, BRIP1*, CDC73*, CDH1*, CDK4, CDKN1B*, CDKN2A, CHEK2*, CTNNA1*, DICER1*, EPCAM (del/dup only),  EGFR, FH*, FLCN*, GREM1 (promoter dup only), HOXB13, KIT, LZTR1, MAX*, MBD4, MEN1*, MET, MITF, MLH1*, MSH2*, MSH3*, MSH6*, MUTYH*, NF1*, NF2*, NTHL1*, PALB2*, PDGFRA, PMS2*, POLD1*, POLE*, POT1*, PRKAR1A*, PTCH1*, PTEN*, RAD51C*, RAD51D*, RB1*, RET, SDHA* (sequencing only), SDHAF2*, SDHB*, SDHC*, SDHD*, SMAD4*, SMARCA4*, SMARCB1*, SMARCE1*, STK11*, SUFU*, TMEM127*, TP53*, TSC1*, TSC2*, VHL*. RNA analysis is performed for * genes.   The test report has been scanned into EPIC and is located under the Molecular Pathology section of the Results Review tab.  A portion of the result report is included below for reference. Genetic testing reported out on Oct 02, 2022.     Breast cancer risk:  Of note, Ms. Griner's genetic test results do not explain her family  history of breast cancer. We discussed with Ms. Kriley that because current genetic testing is not perfect, it is possible there may be a gene mutation in one of these genes that current testing cannot detect, but that chance is small. We also discussed that there could be another gene that has not yet been discovered, or that we have not yet tested, that is responsible for the cancer diagnoses in the family. It is also possible there is a hereditary cause for the cancer in the family that she did not inherit and therefore was not identified in her testing. Therefore,  it is important to remain in touch with cancer genetics in the future so that we can continue to offer Ms. Lykken the most up to date genetic testing.  We discussed that maternal relatives with a history of breast cancer should be offered genetic counseling/testing as they are more informative relatives to test for breast cancer risk.  Her lifetime risk for breast cancer based on Tyrer-Cuzick risk model and reported personal/family history is 10.3%, which is very similar to that of the lifetime population risk for women her age (10.5%). This risk estimate can change over time and may be repeated to  reflect new information in her personal or family history in the future.  She should continue to follow breast screening guidelines as recommended by her GYN/PCP.           The cancers associated with CDKN2A include: Melanoma, 28-67% Pancreatic cancer, 17% risk    CDKN2A Management Recommendations:   Skin Cancer Screening and Risk Reduction: Regular skin self-examinations Individuals should notify their physicians of any changes to moles such as increasing in size, darkening in color, or other change in appearance. Skin examinations by a dermatologist every 6 months Follow sun-safety recommendations such as: Using UVA and UVB 30 SPF or higher sunscreen Avoiding sunburns Wearing protective clothing and sunglasses Avoid using tanning beds  Pancreatic Cancer Screening/Risk Reduction: Avoid smoking, heavy alcohol use, and obesity. Consider pancreatic cancer screening beginning at age 40 years (or 10 years younger than the earliest exocrine pancreatic cancer diagnosis in the family, whichever is earlier). Recommended screening could include annual endoscopic ultrasound and/or MRI of the pancreas.    This information is based on current understanding of the gene and may change in the future.   Implications for Family Members: Hereditary predisposition to cancer due to pathogenic variants in the CDKN2A gene has autosomal dominant inheritance. This means that first degree relatives (parents, children, full siblings) have a 50% chance of having the same mutation in CDKN2A.  More distant relatives also have an increased risk of having the CDKN2A mutation. Identification of a pathogenic variant allows for the recognition of at-risk relatives who can pursue testing for the familial variant.   Family members are encouraged to consider genetic testing for this familial pathogenic variant. They may contact our office at [phone] for more information or to schedule an appointment.   Complimentary testing for the familial variant is available for 150 days from her report date through [employer].  Family members who live outside of the area are encouraged to find a genetic counselor in their area by visiting: [email or web address].   Resources: FORCE (Facing Our Risk of Cancer Empowered) is a resource for those with a hereditary predisposition to develop cancer. FORCE provides information about risk reduction, advocacy, legislation, and clinical trials.  Additionally, FORCE provides a platform for collaboration and support; which includes: peer navigation, message boards, local support groups, a toll-free helpline, research registry and recruitment, advocate training, published medical research, webinars, brochures, mastectomy photos, and more.  For more information, visit www.facingourrisk.org  Melanoma Know More is a resource that provides educational support for melanoma prevention and early detection. For more information, visit www.melanomaknowmore.com   PLAN: Consideration for pancreatic cancer screening.  Patient established with Dr. Adela Lank at [facility].  Message sent to Dr. Adela Lank and Dr. Carloyn Manner so that pancreatic cancer screening consultation can be arranged.  Dermatology referral and skin cancer screening is recommended.  Message sent to PCP.  Family letter provided to encourage family testing.  Patton Swisher M. Rennie Plowman, MS, [facility] Genetic Counselor [email or web address] (P) [phone]

## 2022-10-09 NOTE — Progress Notes (Signed)
  E-Visit for Sore Throat  We are sorry that you are not feeling well.  Here is how we plan to help!  Your symptoms indicate a oral thrush. I have sent in a prescription of nystatin mouthwash that you will swish and swallow four times a day.   Home Care: Only take medications as instructed by your medical team. Do not drink alcohol while taking these medications. A steam or ultrasonic humidifier can help congestion.  You can place a towel over your head and breathe in the steam from hot water coming from a faucet. Avoid close contacts especially the very young and the elderly. Cover your mouth when you cough or sneeze. Always remember to wash your hands.  Get Help Right Away If: You develop worsening fever or throat pain. You develop a severe head ache or visual changes. Your symptoms persist after you have completed your treatment plan.  Make sure you Understand these instructions. Will watch your condition. Will get help right away if you are not doing well or get worse.   Thank you for choosing an e-visit.  Your e-visit answers were reviewed by a board certified advanced clinical practitioner to complete your personal care plan. Depending upon the condition, your plan could have included both over the counter or prescription medications.  Please review your pharmacy choice. Make sure the pharmacy is open so you can pick up prescription now. If there is a problem, you may contact your provider through [employer] and have the prescription routed to another pharmacy.  Your safety is important to [place]. If you have drug allergies check your prescription carefully.   For the next 24 hours you can use MyChart to ask questions about today's visit, request a non-urgent call back, or ask for a work or school excuse. You will get an email in the next two days asking about your experience. I hope that your e-visit has been valuable and will speed your recovery.   Approximately 5  minutes was spent documenting and reviewing patient's chart.

## 2022-10-10 ENCOUNTER — Ambulatory Visit: Payer: Medicaid Other | Admitting: Student

## 2022-10-10 NOTE — Progress Notes (Deleted)
Patient scheduled for appointment in Sept.  MyChart message sent to patient with appointment information

## 2022-10-10 NOTE — Telephone Encounter (Signed)
-----   Message from Benancio Deeds, MD sent at 10/09/2022  5:59 PM EDT ----- Sure.  We can take care of that and get her in the office.  Jan can you please make a follow-up visit for this patient with me in the clinic?  Thanks ----- Message ----- From: Christiane Ha Sent: 10/09/2022   2:33 PM EDT To: Benancio Deeds, MD  Hi Dr. 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  Could you have your team set up an appointment with someone in your office for a pancreatic cancer screening consultation?  Thank you!

## 2022-10-10 NOTE — Telephone Encounter (Signed)
Patient scheduled for appointment with Dr. Adela Lank in Sept. MyChart message to patient with appointment infomation

## 2022-10-14 NOTE — Progress Notes (Signed)
Chief Complaint:   OBESITY Carol Welch is here to discuss her progress with her obesity treatment plan along with follow-up of her obesity related diagnoses. Carol Welch is on keeping a food journal and adhering to recommended goals of 1000-1200 calories and 80+ grams of protein and states she is following her eating plan approximately 80% of the time. Carol Welch states she is walking for 20 minutes 3-4 times per week.  Today's visit was #: 18 Starting weight: 167 lbs Starting date: 08/08/2021 Today's weight: 142 lbs Today's date: 10/09/2022 Total lbs lost to date: 25 Total lbs lost since last in-office visit: 0  Interim History: Patient has done well with maintaining her weight loss.  She is working on following her category 1 plan.  When she gets her protein intake in the morning she notes her hunger is better controlled.  Subjective:   1. Vitamin D insufficiency Patient is on vitamin D prescription, and she is due to have labs done soon.  2. Worried well Patient has a family history of both pancreatitis and pancreatic cancer.  She is interested in knowing what her risk is and how to decrease risk of getting these.  Patient denies abdominal pain.  Assessment/Plan:   1. Vitamin D insufficiency Patient will continue prescription vitamin D, and we will refill for 1 month.  We will recheck labs in 1 to 2 months. - ergocalciferol (VITAMIN D2) 1.25 MG (50000 UT) capsule; Take 1 capsule (50,000 Units total) by mouth once a week.  Dispense: 4 capsule; Refill: 0  2. Worried well Patient was educated about the nature of pancreatic disease, risk factors were discussed, and medications were reviewed.  She was instructed to continue her healthy lifestyle and maintain her weight loss, and minimize EtOH.  3. BMI 26.0-26.9,adult  4. Obesity, Beginning BMI 30.54 [name] is currently in the action stage of change. As such, her goal is to continue with weight loss efforts. She has agreed to the Category 1  Plan.   Exercise goals: As is.   Behavioral modification strategies: increasing lean protein intake.  [name] has agreed to follow-up with our clinic in 4 weeks. She was informed of the importance of frequent follow-up visits to maximize her success with intensive lifestyle modifications for her multiple health conditions.   Objective:   Blood pressure 106/70, pulse 66, temperature 98.6 F (37 C), height 5\' 2"  (1.575 m), weight 142 lb (64.4 kg), last menstrual period 12/19/2019, SpO2 99 %. Body mass index is 25.97 kg/m.  Lab Results  Component Value Date   CREATININE 0.70 07/17/2022   BUN 20 07/17/2022   NA 142 07/17/2022   K 4.4 07/17/2022   CL 105 07/17/2022   CO2 23 07/17/2022   Lab Results  Component Value Date   ALT 10 07/17/2022   AST 18 07/17/2022   ALKPHOS 51 07/17/2022   BILITOT 0.3 07/17/2022   Lab Results  Component Value Date   HGBA1C 5.7 (H) 07/17/2022   HGBA1C 5.6 08/08/2021   Lab Results  Component Value Date   INSULIN 5.5 07/17/2022   INSULIN 5.7 08/08/2021   Lab Results  Component Value Date   TSH 0.499 07/08/2022   Lab Results  Component Value Date   CHOL 237 (H) 07/17/2022   HDL 66 07/17/2022   LDLCALC 155 (H) 07/17/2022   TRIG 91 07/17/2022   Lab Results  Component Value Date   VD25OH 49.7 07/17/2022   VD25OH 48.0 02/11/2022   VD25OH 37.3 08/08/2021   Lab  Results  Component Value Date   WBC 5.8 11/01/2021   HGB 12.9 11/01/2021   HCT 38.1 11/01/2021   MCV 89 11/01/2021   PLT 223 11/01/2021   No results found for: "IRON", "TIBC", "FERRITIN"  Attestation Statements:   Reviewed by clinician on day of visit: allergies, medications, problem list, medical history, surgical history, family history, social history, and previous encounter notes.   I, Burt Knack, am acting as transcriptionist for Quillian Quince, MD.  I have reviewed the above documentation for accuracy and completeness, and I agree with the above. -  Quillian Quince,  MD

## 2022-10-16 DIAGNOSIS — F331 Major depressive disorder, recurrent, moderate: Secondary | ICD-10-CM | POA: Diagnosis not present

## 2022-10-31 ENCOUNTER — Telehealth: Payer: Self-pay

## 2022-10-31 DIAGNOSIS — A048 Other specified bacterial intestinal infections: Secondary | ICD-10-CM

## 2022-10-31 NOTE — Telephone Encounter (Signed)
-----   Message from Missy Sabins, RN sent at 09/17/2022  9:52 AM EDT ----- Regarding: H pylori diatherix H pylori [employer] - need to order

## 2022-11-01 NOTE — Telephone Encounter (Signed)
Waiting for Dr. Lanetta Inch signature on [employer] order.

## 2022-11-04 DIAGNOSIS — Z419 Encounter for procedure for purposes other than remedying health state, unspecified: Secondary | ICD-10-CM | POA: Diagnosis not present

## 2022-11-04 NOTE — Telephone Encounter (Signed)
Patient states that she did complete all therapy for H Pylori as prescribed. Advised that she is due for stool testing to check for H Pylori eradication. Advised that I have placed testing kit at the 3rd floor front desk for her to pick up. Specific collection instructions have been included in the kit and should be returned as soon as possible. Patient verbalizes understanding of this information and says she will pick up the kit around 7/13 as she is currently out of town on vacation.

## 2022-11-11 ENCOUNTER — Other Ambulatory Visit: Payer: Self-pay | Admitting: *Deleted

## 2022-11-11 DIAGNOSIS — M79604 Pain in right leg: Secondary | ICD-10-CM

## 2022-11-13 ENCOUNTER — Encounter: Payer: Self-pay | Admitting: Student

## 2022-11-18 NOTE — Therapy (Deleted)
OUTPATIENT PHYSICAL THERAPY FEMALE PELVIC EVALUATION   Patient Name: Carol Welch MRN: [id number] DOB:October 21, 1975, 47 y.o., female Today's Date: 11/18/2022  END OF SESSION:   Past Medical History:  Diagnosis Date   Anal fissure    Colitis    Depression    Edema, lower extremity    Fatty liver    Hypothyroidism    Low blood pressure    PONV (postoperative nausea and vomiting)    SVD (spontaneous vaginal delivery) 06/30/2015   Vitamin D deficiency    Past Surgical History:  Procedure Laterality Date   BREAST BIOPSY Right 07/09/2022   [place] RT BREAST BX W LOC DEV 1ST LESION IMG BX SPEC US GUIDE 07/09/2022 [facility]   BREAST CYST ASPIRATION Right 02/13/2018   CYSTOSCOPY N/A 12/30/2019   Procedure: CYSTOSCOPY;  Surgeon: Sherian Rein, MD;  Location: [facility];  Service: Gynecology;  Laterality: N/A;   ENDOVENOUS ABLATION SAPHENOUS VEIN W/ LASER Right 10/08/2018   endovenous laser ablation right saphenous vein by Waverly Ferrari MD   LAPAROSCOPIC VAGINAL HYSTERECTOMY WITH SALPINGECTOMY Bilateral 12/30/2019   Procedure: LAPAROSCOPIC ASSISTED VAGINAL HYSTERECTOMY WITH SALPINGECTOMY;  Surgeon: Sherian Rein, MD;  Location: [facility];  Service: Gynecology;  Laterality: Bilateral;   Patient Active Problem List   Diagnosis Date Noted   Worried well 10/09/2022   Obesity, Beginning BMI 30.54 10/09/2022   Genetic testing 10/02/2022   Monoallelic mutation of CDKN2A gene 10/02/2022   Vaginal itching 10/01/2022   H. 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  Rationale for Evaluation and Treatment: Rehabilitation  ONSET DATE: ***  SUBJECTIVE:                                                                                                                                                                                           SUBJECTIVE STATEMENT: *** Fluid intake: {Yes/No:304960894}   PAIN:  Are you having pain? {yes/no:20286} NPRS scale: ***/10 Pain location: {pelvic pain location:27098}  Pain type: {type:313116} Pain description: {PAIN DESCRIPTION:21022940}   Aggravating factors: *** Relieving factors: ***  PRECAUTIONS: {Therapy precautions:24002}  RED FLAGS: {PT Red Flags:29287}   WEIGHT BEARING RESTRICTIONS: No  FALLS:  Has patient fallen in last 6 months? {fallsyesno:27318}  LIVING ENVIRONMENT: Lives with: {OPRC lives with:25569::"lives with their family"} Lives in: {Lives in:25570} Stairs: {opstairs:27293} Has following equipment at home: {Assistive devices:23999}  OCCUPATION: ***  PLOF: {PLOF:24004}  PATIENT GOALS: ***  PERTINENT HISTORY:  Laparoscopic vaginal hysterectomy with salpingectomy 12/30/19; Hypothyroidism; Anal fissure Sexual abuse: {Yes/No:304960894}  BOWEL MOVEMENT: Pain with bowel movement: {yes/no:20286} Type of bowel movement:{PT BM type:27100} Fully empty rectum: {Yes/No:304960894} Leakage: {Yes/No:304960894} Pads: {Yes/No:304960894} Fiber supplement: {Yes/No:304960894}  URINATION: Pain with urination: {yes/no:20286} Fully empty bladder: {Yes/No:304960894} Stream: {PT urination:27102} Urgency:  {Yes/No:304960894} Frequency: *** Leakage: {PT leakage:27103} Pads: {Yes/No:304960894}  INTERCOURSE: Pain with intercourse: {pain with intercourse PA:27099} Ability to have vaginal penetration:  {Yes/No:304960894} Climax: *** Marinoff Scale: ***/3  PREGNANCY: Vaginal deliveries *** Tearing {Yes***/No:304960894} C-section deliveries *** Currently pregnant {Yes***/No:304960894}  PROLAPSE: {PT prolapse:27101}   OBJECTIVE:   DIAGNOSTIC FINDINGS:  ***  PATIENT SURVEYS:  {rehab surveys:24030}  PFIQ-7 ***  COGNITION: Overall cognitive status: {cognition:24006}     SENSATION: Light touch: {intact/deficits:24005} Proprioception: {intact/deficits:24005}  MUSCLE LENGTH: Hamstrings: Right *** deg; Left *** deg Thomas test: Right *** deg; Left *** deg  LUMBAR SPECIAL TESTS:  {lumbar special test:25242}  FUNCTIONAL TESTS:  {Functional tests:24029}  GAIT: Distance walked: *** Assistive device utilized: {Assistive devices:23999} Level of assistance: {Levels of assistance:24026} Comments: ***  POSTURE: {posture:25561}  PELVIC ALIGNMENT:  LUMBARAROM/PROM:  A/PROM A/PROM  eval  Flexion   Extension   Right lateral flexion   Left lateral flexion   Right rotation   Left rotation    (Blank rows = not tested)  LOWER EXTREMITY ROM:  {AROM/PROM:27142} ROM Right eval Left eval  Hip flexion    Hip extension    Hip abduction    Hip adduction    Hip internal rotation    Hip external rotation    Knee flexion    Knee extension    Ankle dorsiflexion    Ankle plantarflexion    Ankle inversion    Ankle eversion     (Blank rows = not tested)  LOWER EXTREMITY MMT:  MMT Right eval Left eval  Hip flexion    Hip extension    Hip abduction    Hip adduction    Hip internal rotation    Hip external rotation    Knee flexion    Knee extension    Ankle dorsiflexion    Ankle plantarflexion    Ankle inversion    Ankle eversion     PALPATION:   General   ***                External Perineal Exam ***                             Internal Pelvic Floor ***  Patient confirms identification and approves PT to assess internal pelvic floor and treatment {yes/no:20286}  PELVIC MMT:   MMT eval  Vaginal   Internal Anal Sphincter   External Anal Sphincter   Puborectalis   Diastasis Recti   (Blank rows = not tested)        TONE: ***  PROLAPSE: ***  TODAY'S TREATMENT:  DATE: ***  EVAL ***   PATIENT EDUCATION:  Education details: *** Person educated: {Person educated:25204} Education method: {Education Method:25205} Education comprehension: {Education Comprehension:25206}  HOME EXERCISE PROGRAM: ***  ASSESSMENT:  CLINICAL IMPRESSION: Patient is a *** y.o. *** who was seen today for physical therapy evaluation and treatment for ***.   OBJECTIVE IMPAIRMENTS: {opptimpairments:25111}.   ACTIVITY LIMITATIONS: {activitylimitations:27494}  PARTICIPATION LIMITATIONS: {participationrestrictions:25113}  PERSONAL FACTORS: {Personal factors:25162} are also affecting patient's functional outcome.   REHAB POTENTIAL: {rehabpotential:25112}  CLINICAL DECISION MAKING: {clinical decision making:25114}  EVALUATION COMPLEXITY: {Evaluation complexity:25115}   GOALS: Goals reviewed with patient? {yes/no:20286}  SHORT TERM GOALS: Target date: ***  *** Baseline: Goal status: INITIAL  2.  *** Baseline:  Goal status: INITIAL  3.  *** Baseline:  Goal status: INITIAL  4.  *** Baseline:  Goal status: INITIAL  5.  *** Baseline:  Goal status: INITIAL  6.  *** Baseline:  Goal status: INITIAL  LONG TERM GOALS: Target date: ***  *** Baseline:  Goal status: INITIAL  2.  *** Baseline:  Goal status: INITIAL  3.  *** Baseline:  Goal status: INITIAL  4.  *** Baseline:  Goal status: INITIAL  5.   *** Baseline:  Goal status: INITIAL  6.  *** Baseline:  Goal status: INITIAL  PLAN:  PT FREQUENCY: {rehab frequency:25116}  PT DURATION: {rehab duration:25117}  PLANNED INTERVENTIONS: {rehab planned interventions:25118::"Therapeutic exercises","Therapeutic activity","Neuromuscular re-education","Balance training","Gait training","Patient/Family education","Self Care","Joint mobilization"}  PLAN FOR NEXT SESSION: ***   Phinehas Grounds, PT 11/18/2022, 8:49 AM

## 2022-11-19 ENCOUNTER — Encounter: Payer: Medicaid Other | Admitting: Physical Therapy

## 2022-11-22 ENCOUNTER — Other Ambulatory Visit: Payer: Self-pay | Admitting: Obstetrics and Gynecology

## 2022-11-22 DIAGNOSIS — N6452 Nipple discharge: Secondary | ICD-10-CM

## 2022-11-26 ENCOUNTER — Encounter: Payer: Medicaid Other | Admitting: Physical Therapy

## 2022-11-27 ENCOUNTER — Ambulatory Visit (HOSPITAL_COMMUNITY): Payer: Medicaid Other

## 2022-11-27 ENCOUNTER — Encounter: Payer: Medicaid Other | Admitting: Vascular Surgery

## 2022-11-27 ENCOUNTER — Ambulatory Visit (INDEPENDENT_AMBULATORY_CARE_PROVIDER_SITE_OTHER): Payer: Medicaid Other | Admitting: Family Medicine

## 2022-12-02 NOTE — Therapy (Unsigned)
OUTPATIENT PHYSICAL THERAPY FEMALE PELVIC EVALUATION   Patient Name: Carol Welch MRN: [id number] DOB:November 06, 1975, 47 y.o., female Today's Date: 12/03/2022  END OF SESSION:  PT End of Session - 12/03/22 1007     Visit Number 1    Date for PT Re-Evaluation 02/25/23    Authorization Type [employer]    PT Start Time 1007    PT Stop Time 1030    PT Time Calculation (min) 23 min    Activity Tolerance Patient tolerated treatment well    Behavior During Therapy WFL for tasks assessed/performed             Past Medical History:  Diagnosis Date   Anal fissure    Colitis    Depression    Edema, lower extremity    Fatty liver    Hypothyroidism    Low blood pressure    PONV (postoperative nausea and vomiting)    SVD (spontaneous vaginal delivery) 06/30/2015   Vitamin D deficiency    Past Surgical History:  Procedure Laterality Date   BREAST BIOPSY Right 07/09/2022   [place] RT BREAST BX W LOC DEV 1ST LESION IMG BX SPEC US GUIDE 07/09/2022 [facility]   BREAST CYST ASPIRATION Right 02/13/2018   CYSTOSCOPY N/A 12/30/2019   Procedure: CYSTOSCOPY;  Surgeon: Sherian Rein, MD;  Location: [facility];  Service: Gynecology;  Laterality: N/A;   ENDOVENOUS ABLATION SAPHENOUS VEIN W/ LASER Right 10/08/2018   endovenous laser ablation right saphenous vein by Waverly Ferrari MD   LAPAROSCOPIC VAGINAL HYSTERECTOMY WITH SALPINGECTOMY Bilateral 12/30/2019   Procedure: LAPAROSCOPIC ASSISTED VAGINAL HYSTERECTOMY WITH SALPINGECTOMY;  Surgeon: Sherian Rein, MD;  Location: [facility];  Service: Gynecology;  Laterality: Bilateral;   Patient Active Problem List   Diagnosis Date Noted   Worried well 10/09/2022   Obesity, Beginning BMI 30.54 10/09/2022   Genetic testing 10/02/2022   Monoallelic mutation of CDKN2A gene 10/02/2022   Vaginal itching 10/01/2022   H. 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   Neck pain 01/18/2020   S/P laparoscopic assisted vaginal hysterectomy (LAVH) 12/30/2019   External thrombosed hemorrhoids 09/11/2019   MDD (major depressive disorder) 08/24/2019   Orthostatic hypotension 08/24/2019   Hypothyroidism 07/06/2018   Family history of congenital anomaly 08/27/2016    PCP: Tiffany Kocher, DO  REFERRING PROVIDER: Benancio Deeds, MD   REFERRING DIAG:  R10.13 (ICD-10-CM) - Postprandial epigastric pain  K92.0 (ICD-10-CM) - Hematemesis with nausea  K59.04 (ICD-10-CM) - Chronic idiopathic constipation  N94.10 (ICD-10-CM) - Dyspareunia in female    THERAPY DIAG:  Other muscle spasm  Pelvic pain  Rationale for Evaluation and Treatment: Rehabilitation  ONSET DATE: 2022  SUBJECTIVE:  SUBJECTIVE STATEMENT: Patient reports she has had issues with stomach and constipation and is getting worse. Patient feels a heaviness. Patient tries to run but feels heaviness and pain in lower abdomen.  Fluid intake: Yes: water or green tea    PAIN:  Are you having pain? Yes NPRS scale: 6/10 Pain location:  lower abdomen  Pain type: bloating, heaviness, bloating Pain description: intermittent , happens 80% of the time  Aggravating factors: running, not having a bowel movement,  Relieving factors: lay down and stroke stomach upward  PRECAUTIONS: None  RED FLAGS: None   WEIGHT BEARING RESTRICTIONS: No  FALLS:  Has patient fallen in last 6 months? No  LIVING ENVIRONMENT: Lives with: lives with their  family  OCCUPATION: walking, weights  PLOF: Independent  PATIENT GOALS: reduce discomfort, easier to have a bowel movement  PERTINENT HISTORY:  Anal fissure; Colitis; Hypothyroidism;   BOWEL MOVEMENT: Pain with bowel movement: Yes, intermittent 5/10 Type of bowel movement:Type (Bristol Stool Scale) type 4, Frequency daily, Strain Yes, and Splinting none Fully empty rectum: No Leakage: No Fiber supplement: Yes: Metamucil  URINATION: Pain with urination: No Fully empty bladder: Yes: but at times feels she has more urine left and sit back down to empty bladder further Stream: Strong Urgency: No Frequency: average Leakage:  none Pads: No  INTERCOURSE: Pain with intercourse: Initial Penetration and During Penetration, Patient feels a little spot hurts that is upward to the left Ability to have vaginal penetration:  Yes:   Climax: sometimes, struggle since hysterectectomy Marinoff Scale: 2/3  PREGNANCY: Vaginal deliveries 5, last delivery 03/2018 Tearing did have tearing with the first few and needed stitches, did not have stitches on fourth but had tearing   PROLAPSE: None   OBJECTIVE:   DIAGNOSTIC FINDINGS:  none  PATIENT SURVEYS:  PFIQ-7 133; CRAIQ-7: 71; POPIQ-7 62  COGNITION: Overall cognitive status: Within functional limits for tasks assessed      POSTURE: No Significant postural limitations  PELVIC ALIGNMENT: right ilium anteriorly rotated  LUMBARAROM/PROM:  A/PROM A/PROM  eval  Flexion Decreased by 25%  Extension Decreased by 50%  Right lateral flexion Decreased by 25%  Left lateral flexion full  Right rotation Decreased by 25%  Left rotation Decreased by 25%   (Blank rows = not tested)  LOWER EXTREMITY ROM:  Passive ROM Right eval Left eval  Hip external rotation 40 40   (Blank rows = not tested)  LOWER EXTREMITY MMT:  MMT Right eval  Hip flexion 5/5  Hip extension 4/5  Hip abduction 3/5  Hip adduction 4/5   PALPATION:    General  throughout the abdomen, decreased diaphragm mobility and lower rib cage mobility                External Perineal Exam along the posterior 1/3 of the external rectum; not able to do internal through the rectum due to pain and tightness, tenderness located on bil. Ischiocavernosus and bulbocavernosus                             Internal Pelvic Floor tenderness located in bilateral obturator internist, levator ani, and above the bladder on the anterior wall  Patient confirms identification and approves PT to assess internal pelvic floor and treatment Yes  PELVIC MMT:   MMT eval  Vaginal 2/5  Internal Anal Sphincter   External Anal Sphincter   Puborectalis   Diastasis Recti   (Blank rows = not tested)  TONE: increased  PROLAPSE: Anterior wall weakness just above the introitus, posterior wall weakness just above the introitus  TODAY'S TREATMENT:                                                                                                                              DATE: 12/03/22  EVAL completed the evaluation   PATIENT EDUCATION:  Education details: educated patient on what will be done during treatment and reviewed her goals.  Person educated: Patient Education method: Explanation Education comprehension: verbalized understanding  HOME EXERCISE PROGRAM: See above.   ASSESSMENT:  CLINICAL IMPRESSION: Patient is a 47 y.o. female who was seen today for physical therapy evaluation and treatment for dyspareunia and constipation. Patient has to strain to have a type 4 bowel movement daily. She takes Metamucil to assist with bowel movements. She does not feel like she fully empties her rectum. Therapist was not able to perform an internal rectal exam to assess muscles due to pain and the inability to place her finger into the rectum because of tightness. Patient does not fully empty her bladder and has to sit a second time to empty her bladder. Patient reports pain  with penile penetration vaginally especially in the left upper side. Her pelvic floor strength is 2/5. She has tenderness located in bilateral levator ani, obturator internist, and superior to the bladder. She has decreased lumbar ROM and pelvis is rotated anteriorly on the right. She has weakness in the right hip. Patient will benefit from skilled therapy to relax her pelvic floor muscles to improve defecation and vaginal penetration.   OBJECTIVE IMPAIRMENTS: decreased coordination, decreased strength, increased fascial restrictions, increased muscle spasms, and pain.   ACTIVITY LIMITATIONS: toileting  PARTICIPATION LIMITATIONS: interpersonal relationship  PERSONAL FACTORS: 1 comorbidity: Anal fissure; Colitis; Hypothyroidism;  are also affecting patient's functional outcome.   REHAB POTENTIAL: Good  CLINICAL DECISION MAKING: Stable/uncomplicated  EVALUATION COMPLEXITY: Low   GOALS: Goals reviewed with patient? Yes  SHORT TERM GOALS: Target date: 12/30/22  Patient is independent with initial HEP.  Baseline:Not educated yet Goal status: INITIAL   LONG TERM GOALS: Target date: 02/25/23  Patient is independent with advanced HEP for stretches and core strength.  Baseline: not educated yet Goal status: INITIAL  2.  Patient is independent with abdominal massage to improve defecation.  Baseline: Not educated yet Goal status: INITIAL  3.  Patient is abel to defecate with pain level </= 1/10 and straining reduced >/= 75% due to improved lengthening of the muscles.  Baseline: pain level 5/10 and has to strain.  Goal status: INITIAL  4.  Patient is able to have penile penetration vaginally with pain level </= 1-2/10.  Baseline: Pain level 5/10 Goal status: INITIAL  5.  Patient is able to fully empty her bladder due to the ability to relax her pelvic floor.  Baseline: Has to sit 2 times Goal status: INITIAL    PLAN:  PT FREQUENCY: 1x/week  PT DURATION: 12 weeks  PLANNED  INTERVENTIONS: Therapeutic exercises, Therapeutic activity, Neuromuscular re-education, Patient/Family education, Joint mobilization, Aquatic Therapy, Electrical stimulation, Spinal mobilization, Cryotherapy, Moist heat, Ultrasound, Biofeedback, and Manual therapy  PLAN FOR NEXT SESSION: diaphragmatic breathing with pelvic drop, manual work to pelvic floor, stretches to relax the pelvic floor   Eulis Foster, PT 12/03/22 2:06 PM

## 2022-12-03 ENCOUNTER — Encounter: Payer: Medicaid Other | Attending: Physician Assistant | Admitting: Physical Therapy

## 2022-12-03 ENCOUNTER — Other Ambulatory Visit: Payer: Self-pay

## 2022-12-03 ENCOUNTER — Encounter: Payer: Self-pay | Admitting: Physical Therapy

## 2022-12-03 DIAGNOSIS — M62838 Other muscle spasm: Secondary | ICD-10-CM | POA: Diagnosis not present

## 2022-12-03 DIAGNOSIS — R102 Pelvic and perineal pain: Secondary | ICD-10-CM | POA: Insufficient documentation

## 2022-12-03 DIAGNOSIS — N6452 Nipple discharge: Secondary | ICD-10-CM | POA: Diagnosis not present

## 2022-12-04 ENCOUNTER — Ambulatory Visit (HOSPITAL_COMMUNITY)
Admission: RE | Admit: 2022-12-04 | Discharge: 2022-12-04 | Disposition: A | Discharge status: Home / Self Care | Payer: Medicaid Other | Source: Ambulatory Visit | Attending: Vascular Surgery | Admitting: Vascular Surgery

## 2022-12-04 ENCOUNTER — Ambulatory Visit
Admission: RE | Admit: 2022-12-04 | Discharge: 2022-12-04 | Disposition: A | Discharge status: Home / Self Care | Payer: Medicaid Other | Source: Ambulatory Visit | Attending: Obstetrics and Gynecology | Admitting: Obstetrics and Gynecology

## 2022-12-04 ENCOUNTER — Ambulatory Visit (INDEPENDENT_AMBULATORY_CARE_PROVIDER_SITE_OTHER): Payer: Medicaid Other | Admitting: Vascular Surgery

## 2022-12-04 ENCOUNTER — Ambulatory Visit: Admission: RE | Admit: 2022-12-04 | Payer: Medicaid Other | Source: Ambulatory Visit

## 2022-12-04 ENCOUNTER — Encounter: Payer: Self-pay | Admitting: Vascular Surgery

## 2022-12-04 ENCOUNTER — Other Ambulatory Visit: Payer: Medicaid Other

## 2022-12-04 VITALS — BP 94/63 | HR 62 | Temp 98.3°F | Resp 14 | Ht 62.0 in | Wt 155.0 lb

## 2022-12-04 DIAGNOSIS — M79604 Pain in right leg: Secondary | ICD-10-CM | POA: Insufficient documentation

## 2022-12-04 DIAGNOSIS — M79605 Pain in left leg: Secondary | ICD-10-CM | POA: Diagnosis not present

## 2022-12-04 DIAGNOSIS — I872 Venous insufficiency (chronic) (peripheral): Secondary | ICD-10-CM | POA: Diagnosis not present

## 2022-12-04 DIAGNOSIS — N6011 Diffuse cystic mastopathy of right breast: Secondary | ICD-10-CM | POA: Diagnosis not present

## 2022-12-04 DIAGNOSIS — I8393 Asymptomatic varicose veins of bilateral lower extremities: Secondary | ICD-10-CM

## 2022-12-04 DIAGNOSIS — N6452 Nipple discharge: Secondary | ICD-10-CM

## 2022-12-04 DIAGNOSIS — N6315 Unspecified lump in the right breast, overlapping quadrants: Secondary | ICD-10-CM | POA: Diagnosis not present

## 2022-12-04 DIAGNOSIS — K2970 Gastritis, unspecified, without bleeding: Secondary | ICD-10-CM | POA: Diagnosis not present

## 2022-12-04 DIAGNOSIS — B9681 Helicobacter pylori [H. pylori] as the cause of diseases classified elsewhere: Secondary | ICD-10-CM | POA: Diagnosis not present

## 2022-12-04 NOTE — Progress Notes (Signed)
ASSESSMENT & PLAN   CHRONIC VENOUS INSUFFICIENCY: This patient has CEAP C3 venous disease.  She has symptoms of venous hypertension.  I have encouraged her to avoid prolonged sitting and standing.  We discussed importance of exercise specifically walking and water aerobics.  We have fitted her for some new thigh-high compression stockings with a gradient of 20 to 30 mmHg.  We discussed the importance of leg elevation and the proper positioning for this.  Fortunately she has a healthy weight.  I will bring her back in 3 months to see one of my partners to see if her symptoms have improved.  If she is continuing to have significant problems with swelling and symptoms from venous hypertension, I think she would be a good candidate for laser ablation of the left great saphenous vein.   REASON FOR CONSULT:    Chronic venous disease.  The consult is requested by Dr. Denny Levy  HPI:   Carol Welch is a 47 y.o. female who underwent laser ablation of the right great saphenous vein for C3 venous disease in June 2020.  She has done well from that standpoint.  She returns now with similar symptoms in the left leg.  She describes aching pain and heaviness in the left leg associated with prolonged sitting and standing.  This is relieved with elevation.  She has been wearing her knee-high stockings and also the thigh-high stockings from previously.  These do help her symptoms some.  She is also been elevating her legs and exercising.  She denies any previous history of DVT.  She has noted some swelling more so in the left leg.  I do not get any history of claudication or rest pain.  Past Medical History:  Diagnosis Date   Anal fissure    Colitis    Depression    Edema, lower extremity    Fatty liver    Hypothyroidism    Low blood pressure    PONV (postoperative nausea and vomiting)    SVD (spontaneous vaginal delivery) 06/30/2015   Vitamin D deficiency     Family History  Problem Relation Age of  Onset   High blood pressure Mother    High Cholesterol Mother    Obesity Mother    Heart disease Father    Breast cancer Maternal Aunt        dx 48s   Breast cancer Maternal Aunt        d. 30s   Esophageal cancer Paternal Aunt        d. 74   Stomach cancer Paternal Aunt        d.74   Breast cancer Maternal Grandmother        d. 85   Heart disease Paternal Grandfather    Breast cancer Cousin        four maternal female cousins w/ breast ca before age 53   Colon cancer Neg Hx    Rectal cancer Neg Hx     SOCIAL HISTORY: Social History   Tobacco Use   Smoking status: Never    Passive exposure: Never   Smokeless tobacco: Never  Substance Use Topics   Alcohol use: Yes    Comment: once a year    No Known Allergies  Current Outpatient Medications  Medication Sig Dispense Refill   acetaminophen (TYLENOL) 500 MG tablet Take 1,000 mg by mouth every 6 (six) hours as needed for mild pain, moderate pain, fever or headache.     cholecalciferol (VITAMIN D3)  25 MCG (1000 UNIT) tablet Take 1,000 Units by mouth daily.     Cyanocobalamin (B-12 PO) Take 1 tablet by mouth daily.     ergocalciferol (VITAMIN D2) 1.25 MG (50000 UT) capsule Take 1 capsule (50,000 Units total) by mouth once a week. 4 capsule 0   escitalopram (LEXAPRO) 20 MG tablet Take 1 tablet (20 mg total) by mouth daily. 90 tablet 2   famotidine (PEPCID) 20 MG tablet Take 1 tablet (20 mg total) by mouth 2 (two) times daily. 30 tablet 3   ibuprofen (ADVIL) 200 MG tablet Take 200 mg by mouth every 6 (six) hours as needed for mild pain.     Krill Oil (OMEGA-3) 500 MG CAPS Take 500 mg by mouth daily.     levothyroxine (SYNTHROID) 75 MCG tablet TAKE 1 TABLET(75 MCG) BY MOUTH DAILY 90 tablet 3   linaclotide (LINZESS) 72 MCG capsule Take 1 capsule (72 mcg total) by mouth daily before breakfast. 90 capsule 3   metFORMIN (GLUCOPHAGE) 500 MG tablet      Multiple Vitamins-Minerals (MULTIVITAMIN WITH MINERALS) tablet Take 1 tablet by  mouth daily.     nystatin (MYCOSTATIN) 100000 UNIT/ML suspension Use as directed 5 mLs (500,000 Units total) in the mouth or throat 4 (four) times daily. 473 mL 0   ondansetron (ZOFRAN) 4 MG tablet Take 1 tablet (4 mg total) by mouth every 8 (eight) hours as needed for nausea or vomiting. 20 tablet 0   pantoprazole (PROTONIX) 40 MG tablet Take 1 tablet (40 mg total) by mouth daily. 30 tablet 3   Psyllium (FIBER) 28.3 % POWD Take 1 Package by mouth 2 (two) times daily. 368 g 0   pantoprazole (PROTONIX) 40 MG tablet Take 1 tablet (40 mg total) by mouth 2 (two) times daily for 14 days. 28 tablet 0   No current facility-administered medications for this visit.    REVIEW OF SYSTEMS:  [X]  denotes positive finding, [ ]  denotes negative finding Cardiac  Comments:  Chest pain or chest pressure:    Shortness of breath upon exertion:    Short of breath when lying flat:    Irregular heart rhythm:        Vascular    Pain in calf, thigh, or hip brought on by ambulation:    Pain in feet at night that wakes you up from your sleep:     Blood clot in your veins:    Leg swelling:  x       Pulmonary    Oxygen at home:    Productive cough:     Wheezing:         Neurologic    Sudden weakness in arms or legs:     Sudden numbness in arms or legs:     Sudden onset of difficulty speaking or slurred speech:    Temporary loss of vision in one eye:     Problems with dizziness:         Gastrointestinal    Blood in stool:     Vomited blood:         Genitourinary    Burning when urinating:     Blood in urine:        Psychiatric    Major depression:         Hematologic    Bleeding problems:    Problems with blood clotting too easily:        Skin    Rashes or ulcers:  Constitutional    Fever or chills:    -  PHYSICAL EXAM:   Vitals:   12/04/22 1341  BP: 94/63  Pulse: 62  Resp: 14  Temp: 98.3 F (36.8 C)  TempSrc: Temporal  SpO2: 99%  Weight: 155 lb (70.3 kg)  Height: 5\' 2"   (1.575 m)   Body mass index is 28.35 kg/m. GENERAL: The patient is a well-nourished female, in no acute distress. The vital signs are documented above. CARDIAC: There is a regular rate and rhythm.  VASCULAR: I do not detect carotid bruits. I could not palpate pedal pulses. On the right side she had a biphasic dorsalis pedis and posterior tibial signal. On the left side she had a biphasic anterior tibial and posterior tibial signal. She has reticular veins and spider veins bilaterally.  She has mild bilateral lower extremity swelling.  She does not have any significant hyperpigmentation. I did look at her left great saphenous vein myself with the SonoSite.  She has reflux down to the distal thigh.  The vein becomes smaller in the distal thigh and I would likely cannulate the vein at the junction of the mid and distal thigh. PULMONARY: There is good air exchange bilaterally without wheezing or rales. ABDOMEN: Soft and non-tender with normal pitched bowel sounds.  MUSCULOSKELETAL: There are no major deformities. NEUROLOGIC: No focal weakness or paresthesias are detected. SKIN: There are no ulcers or rashes noted. PSYCHIATRIC: The patient has a normal affect.  DATA:    VENOUS DUPLEX: I have independently interpreted her venous duplex scan today.  This was of the left lower extremity only.  There was no DVT.  There was deep venous reflux in the common femoral vein.  There was superficial venous reflux in the left great saphenous vein from the saphenofemoral junction to the proximal calf.  Diameters of the vein ranged from 3.7-5.7 mm.  The results of the study are summarized in the diagram below.     Waverly Ferrari [facility]

## 2022-12-05 DIAGNOSIS — Z419 Encounter for procedure for purposes other than remedying health state, unspecified: Secondary | ICD-10-CM | POA: Diagnosis not present

## 2022-12-06 ENCOUNTER — Telehealth: Payer: Self-pay | Admitting: Gastroenterology

## 2022-12-06 DIAGNOSIS — A048 Other specified bacterial intestinal infections: Secondary | ICD-10-CM

## 2022-12-06 NOTE — Telephone Encounter (Signed)
H. pylori stool swab returned from 12/04/2022 and it is positive.  Recall she had an EGD that was positive for H. pylori on May 7. She was treated with the following regimen for that: Amoxicillin 1gm BID Clarithromycin 500mg  BID Flagyl 500mg  BID Continue protonix 40mg  BID   Carol Welch can you relay the following:  Looks like this regimen did not eradicate the H pylori, she remains positive. Recommending the following regimen at this point to treat the H pylori.  Omeprazole protonix 40mg  BID  x 14 d  Pepto Bismol 2 tabs (262 mg each) 4 times a day x 14 d  Metronidazole 250 mg 4 times a day x 14 d  Tetracycline 500mg   4 times a day x 14 d   Can you please order this if no signfiicant interactions noted. Once done with therapy, the patient should wait one month and perform a stool study for H pylori antigen to ensure negative. The patient should avoid alcohol while taking Flagyl.    Thanks.

## 2022-12-09 MED ORDER — METRONIDAZOLE 250 MG PO TABS: 250.0000 mg | ORAL_TABLET | Freq: Four times a day (QID) | ORAL | 0 refills | Status: AC

## 2022-12-09 MED ORDER — BISMUTH SUBSALICYLATE 262 MG PO TABS: 2.0000 | ORAL_TABLET | Freq: Four times a day (QID) | ORAL | 0 refills | Status: AC

## 2022-12-09 MED ORDER — TETRACYCLINE HCL 500 MG PO CAPS: 500.0000 mg | ORAL_CAPSULE | Freq: Four times a day (QID) | ORAL | 0 refills | Status: AC

## 2022-12-09 NOTE — Telephone Encounter (Signed)
Called and spoke with patient regarding results. Patient has been advised that she will need retreatment for H. Pylori. Pt requested that prescriptions be sent to pharmacy on file, pt knows to increase her Protonix 40 mg to twice a day during treatment. Avoid alcohol. Patient is aware that I will remind her about repeat stool test in about 6 weeks. Patient verbalized understanding and had no concerns at the end of the call.  Repeat H. Pylori stool test due around 01/20/23 - Lab reminder and order in epic.  Prescriptions sent to pharmacy on file per pt request\.

## 2022-12-09 NOTE — Addendum Note (Signed)
Addended by: Missy Sabins on: 12/09/2022 09:50 AM   Modules accepted: Orders

## 2022-12-10 ENCOUNTER — Encounter: Payer: Self-pay | Admitting: Obstetrics and Gynecology

## 2022-12-11 ENCOUNTER — Other Ambulatory Visit: Payer: Self-pay | Admitting: Obstetrics and Gynecology

## 2022-12-11 DIAGNOSIS — N6452 Nipple discharge: Secondary | ICD-10-CM

## 2022-12-11 DIAGNOSIS — F331 Major depressive disorder, recurrent, moderate: Secondary | ICD-10-CM | POA: Diagnosis not present

## 2022-12-12 DIAGNOSIS — R1032 Left lower quadrant pain: Secondary | ICD-10-CM | POA: Diagnosis not present

## 2022-12-12 DIAGNOSIS — N3289 Other specified disorders of bladder: Secondary | ICD-10-CM | POA: Diagnosis not present

## 2022-12-16 ENCOUNTER — Other Ambulatory Visit: Payer: Self-pay | Admitting: Obstetrics and Gynecology

## 2022-12-16 ENCOUNTER — Other Ambulatory Visit (HOSPITAL_COMMUNITY): Payer: Self-pay

## 2022-12-16 ENCOUNTER — Telehealth: Payer: Self-pay | Admitting: Pharmacy Technician

## 2022-12-16 DIAGNOSIS — N6452 Nipple discharge: Secondary | ICD-10-CM

## 2022-12-16 NOTE — Telephone Encounter (Signed)
Pharmacy Patient Advocate Encounter   Received notification from CoverMyMeds that prior authorization for TETRACYCLINE 500MG  is required/requested.   Insurance verification completed.   The patient is insured through [facility] .   Per test claim: PA required; PA submitted to [facility] via CoverMyMeds Key/confirmation #/EOC [id number] Status is pending

## 2022-12-17 ENCOUNTER — Ambulatory Visit (INDEPENDENT_AMBULATORY_CARE_PROVIDER_SITE_OTHER): Payer: Medicaid Other | Admitting: Family Medicine

## 2022-12-17 ENCOUNTER — Encounter: Payer: Medicaid Other | Attending: Physician Assistant | Admitting: Physical Therapy

## 2022-12-17 ENCOUNTER — Encounter: Payer: Self-pay | Admitting: Physical Therapy

## 2022-12-17 DIAGNOSIS — R102 Pelvic and perineal pain: Secondary | ICD-10-CM | POA: Diagnosis not present

## 2022-12-17 DIAGNOSIS — R350 Frequency of micturition: Secondary | ICD-10-CM | POA: Diagnosis not present

## 2022-12-17 DIAGNOSIS — R279 Unspecified lack of coordination: Secondary | ICD-10-CM

## 2022-12-17 DIAGNOSIS — M62838 Other muscle spasm: Secondary | ICD-10-CM | POA: Diagnosis not present

## 2022-12-17 DIAGNOSIS — M6281 Muscle weakness (generalized): Secondary | ICD-10-CM | POA: Diagnosis not present

## 2022-12-17 DIAGNOSIS — N898 Other specified noninflammatory disorders of vagina: Secondary | ICD-10-CM | POA: Diagnosis not present

## 2022-12-17 NOTE — Therapy (Signed)
OUTPATIENT PHYSICAL THERAPY FEMALE PELVIC EVALUATION   Patient Name: Carol Welch MRN: [id number] DOB:[birth year], 47 y.o., female Today's Date: 12/17/2022  END OF SESSION:  PT End of Session - 12/17/22 0933     Visit Number 2    Date for PT Re-Evaluation 02/25/23    Authorization Type [employer]    Authorization Time Period 12/03/2022-02/01/2023    Authorization - Visit Number 1    Authorization - Number of Visits 10    PT Start Time 0930    PT Stop Time 1015    PT Time Calculation (min) 45 min    Activity Tolerance Patient tolerated treatment well    Behavior During Therapy WFL for tasks assessed/performed             Past Medical History:  Diagnosis Date   Anal fissure    Colitis    Depression    Edema, lower extremity    Fatty liver    Hypothyroidism    Low blood pressure    PONV (postoperative nausea and vomiting)    SVD (spontaneous vaginal delivery) 06/30/2015   Vitamin D deficiency    Past Surgical History:  Procedure Laterality Date   BREAST BIOPSY Right 07/09/2022   [place] RT BREAST BX W LOC DEV 1ST LESION IMG BX SPEC US GUIDE 07/09/2022 [facility]   BREAST CYST ASPIRATION Right 02/13/2018   CYSTOSCOPY N/A 12/30/2019   Procedure: CYSTOSCOPY;  Surgeon: Sherian Rein, MD;  Location: [facility];  Service: Gynecology;  Laterality: N/A;   ENDOVENOUS ABLATION SAPHENOUS VEIN W/ LASER Right 10/08/2018   endovenous laser ablation right saphenous vein by Waverly Ferrari MD   LAPAROSCOPIC VAGINAL HYSTERECTOMY WITH SALPINGECTOMY Bilateral 12/30/2019   Procedure: LAPAROSCOPIC ASSISTED VAGINAL HYSTERECTOMY WITH SALPINGECTOMY;  Surgeon: Sherian Rein, MD;  Location: [facility];  Service: Gynecology;  Laterality: Bilateral;   Patient Active Problem List   Diagnosis Date Noted   Worried well 10/09/2022   Obesity, Beginning BMI 30.54 10/09/2022   Genetic testing 10/02/2022   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   Neck pain 01/18/2020   S/P laparoscopic assisted vaginal hysterectomy (LAVH) 12/30/2019   External thrombosed hemorrhoids 09/11/2019   MDD (major depressive disorder) 08/24/2019   Orthostatic hypotension 08/24/2019   Hypothyroidism 07/06/2018   Family history of congenital anomaly 08/27/2016    PCP: Tiffany Kocher, DO  REFERRING PROVIDER: Benancio Deeds, MD   REFERRING DIAG:  R10.13 (ICD-10-CM) - Postprandial epigastric pain  K92.0 (ICD-10-CM) - Hematemesis with nausea  K59.04 (ICD-10-CM) - Chronic idiopathic constipation  N94.10 (ICD-10-CM) - Dyspareunia in female    THERAPY DIAG:  Other muscle spasm  Pelvic pain  Muscle weakness (generalized)  Unspecified lack of coordination  Rationale for Evaluation and Treatment: Rehabilitation  ONSET DATE: 2022  SUBJECTIVE:  SUBJECTIVE STATEMENT: No questions.   PAIN:  Are you having pain? Yes NPRS scale: 6/10 Pain location:  lower abdomen  Pain type: bloating, heaviness, bloating Pain description: intermittent , happens 80% of the time  Aggravating factors: running, not having a bowel movement,  Relieving factors: lay down and stroke stomach upward  PRECAUTIONS: None  RED FLAGS: None   WEIGHT BEARING RESTRICTIONS: No  FALLS:  Has patient fallen in last 6 months? No  LIVING ENVIRONMENT: Lives with: lives with their  family  OCCUPATION: walking, weights  PLOF: Independent  PATIENT GOALS: reduce discomfort, easier to have a bowel movement  PERTINENT HISTORY:  Anal fissure; Colitis; Hypothyroidism;   BOWEL MOVEMENT: Pain with bowel movement: Yes, intermittent 5/10 Type of bowel movement:Type (Bristol Stool Scale) type 4, Frequency daily, Strain Yes, and Splinting none Fully empty rectum: No Leakage: No Fiber supplement: Yes: Metamucil  URINATION: Pain with urination: No Fully empty bladder: Yes: but at times feels she has more urine left and sit back down to empty bladder further Stream: Strong Urgency: No Frequency: average Leakage:  none Pads: No  INTERCOURSE: Pain with intercourse: Initial Penetration and During Penetration, Patient feels a little spot hurts that is upward to the left Ability to have vaginal penetration:  Yes:   Climax: sometimes, struggle since hysterectectomy Marinoff Scale: 2/3  PREGNANCY: Vaginal deliveries 5, last delivery 03/2018 Tearing did have tearing with the first few and needed stitches, did not have stitches on fourth but had tearing   PROLAPSE: None   OBJECTIVE:   DIAGNOSTIC FINDINGS:  none  PATIENT SURVEYS:  PFIQ-7 133; CRAIQ-7: 71; POPIQ-7 62  COGNITION: Overall cognitive status: Within functional limits for tasks assessed      POSTURE: No Significant postural limitations  PELVIC ALIGNMENT: right ilium anteriorly rotated  LUMBARAROM/PROM:  A/PROM A/PROM  eval  Flexion Decreased by 25%  Extension Decreased by 50%  Right lateral flexion Decreased by 25%  Left lateral flexion full  Right rotation Decreased by 25%  Left rotation Decreased by 25%   (Blank rows = not tested)  LOWER EXTREMITY ROM:  Passive ROM Right eval Left eval  Hip external rotation 40 40   (Blank rows = not tested)  LOWER EXTREMITY MMT:  MMT Right eval  Hip flexion 5/5  Hip extension 4/5  Hip abduction 3/5  Hip adduction 4/5   PALPATION:    General  throughout the abdomen, decreased diaphragm mobility and lower rib cage mobility                External Perineal Exam along the posterior 1/3 of the external rectum; not able to do internal through the rectum due to pain and tightness, tenderness located on bil. Ischiocavernosus and bulbocavernosus                             Internal Pelvic Floor tenderness located in bilateral obturator internist, levator ani, and above the bladder on the anterior wall  Patient confirms identification and approves PT to assess internal pelvic floor and treatment Yes  PELVIC MMT:   MMT eval  Vaginal 2/5  Internal Anal Sphincter   External Anal Sphincter   Puborectalis   Diastasis Recti   (Blank rows = not tested)        TONE: increased  PROLAPSE: Anterior wall weakness just above the introitus, posterior wall weakness just above the introitus  TODAY'S TREATMENT:  12/17/22 Manual: Soft tissue mobilization: Manual work to  the abdomen, diaphragm, obliques, and rectus Educated patient on tissue rolling of abdomen, pulling up on the lower abdomen, and pulling from the back to the front Myofascial release: Release of the lower abdomen to reduce the restrictions Release of the mesenteric root, mid abdominal area Tissue rolling of the abdomen Exercises: Stretches/mobility: Bilateral hamstring stretch holding 30 sec supine Supine pull knee across the trunk for 30 bil.  Happy Baby holding 30 sec  Cat cow 15 x  Childs pose holding 30 sec Prone press up 5 times Quadruped thread the needle with full thoracic rotation                                                                                                                                DATE: 12/03/22  EVAL completed the evaluation  PATIENT EDUCATION: 12/17/22 Education details: Access Code: [email or web address] Person educated: Patient Education method: Explanation, Demonstration, Tactile cues, Verbal cues, and Handouts Education  comprehension: verbalized understanding, returned demonstration, verbal cues required, tactile cues required, and needs further education    HOME EXERCISE PROGRAM: 12/17/22 Access Code: [email or web address] URL: [email or web address] Date: 12/17/2022 Prepared by: Eulis Foster  Exercises - Supine Piriformis Stretch with Leg Straight  - 1 x daily - 7 x weekly - 1 sets - 2 reps - 30 sec hold - Supine Hamstring Stretch  - 1 x daily - 7 x weekly - 1 sets - 2 reps - 30 sec hold - Happy Baby with Pelvic Floor Lengthening  - 1 x daily - 7 x weekly - 1 sets - 1 reps - 30 sec hold - Cat Cow  - 1 x daily - 7 x weekly - 1 sets - 10 reps - Child's Pose Stretch  - 1 x daily - 7 x weekly - 1 sets - 1 reps - 30 sec hold - Quadruped Full Range Thoracic Rotation with Reach  - 1 x daily - 7 x weekly - 2 reps - 5 sec hold - Prone Press Up  - 1 x daily - 7 x weekly - 1 sets - 10 reps  ASSESSMENT:  CLINICAL IMPRESSION: Patient is a 47 y.o. female who was seen today for physical therapy  treatment for dyspareunia and constipation. Patient has learned stretches to elongate tissue. She had reduction of abdominal swelling after the manual work. Patient understands how to perform manual work to the abdomen. She has weakness in the right hip. Patient will benefit from skilled therapy to relax her pelvic floor muscles to improve defecation and vaginal penetration.   OBJECTIVE IMPAIRMENTS: decreased coordination, decreased strength, increased fascial restrictions, increased muscle spasms, and pain.   ACTIVITY LIMITATIONS: toileting  PARTICIPATION LIMITATIONS: interpersonal relationship  PERSONAL FACTORS: 1 comorbidity: Anal fissure; Colitis; Hypothyroidism;  are also affecting patient's functional outcome.   REHAB POTENTIAL: Good  CLINICAL DECISION MAKING: Stable/uncomplicated  EVALUATION COMPLEXITY: Low   GOALS: Goals reviewed with patient? Yes  SHORT TERM GOALS: Target date: 12/30/22  Patient  is  independent with initial HEP.  Baseline:Not educated yet Goal status: INITIAL   LONG TERM GOALS: Target date: 02/25/23  Patient is independent with advanced HEP for stretches and core strength.  Baseline: not educated yet Goal status: INITIAL  2.  Patient is independent with abdominal massage to improve defecation.  Baseline: Not educated yet Goal status: INITIAL  3.  Patient is abel to defecate with pain level </= 1/10 and straining reduced >/= 75% due to improved lengthening of the muscles.  Baseline: pain level 5/10 and has to strain.  Goal status: INITIAL  4.  Patient is able to have penile penetration vaginally with pain level </= 1-2/10.  Baseline: Pain level 5/10 Goal status: INITIAL  5.  Patient is able to fully empty her bladder due to the ability to relax her pelvic floor.  Baseline: Has to sit 2 times Goal status: INITIAL    PLAN:  PT FREQUENCY: 1x/week  PT DURATION: 12 weeks  PLANNED INTERVENTIONS: Therapeutic exercises, Therapeutic activity, Neuromuscular re-education, Patient/Family education, Joint mobilization, Aquatic Therapy, Electrical stimulation, Spinal mobilization, Cryotherapy, Moist heat, Ultrasound, Biofeedback, and Manual therapy  PLAN FOR NEXT SESSION: diaphragmatic breathing with pelvic drop, manual work to pelvic floor, dry needling to lumbar and manual work , quadruped pulling the tissue from the back to the abdomen, release of the abdomen and suprapubic and diaphragm   Eulis Foster, PT 12/17/22 10:25 AM

## 2022-12-18 ENCOUNTER — Encounter (INDEPENDENT_AMBULATORY_CARE_PROVIDER_SITE_OTHER): Payer: Self-pay | Admitting: Physician Assistant

## 2022-12-18 ENCOUNTER — Ambulatory Visit (INDEPENDENT_AMBULATORY_CARE_PROVIDER_SITE_OTHER): Payer: Medicaid Other | Admitting: Physician Assistant

## 2022-12-18 VITALS — BP 90/63 | HR 60 | Temp 97.6°F | Ht 62.0 in | Wt 144.0 lb

## 2022-12-18 DIAGNOSIS — E039 Hypothyroidism, unspecified: Secondary | ICD-10-CM | POA: Diagnosis not present

## 2022-12-18 DIAGNOSIS — A048 Other specified bacterial intestinal infections: Secondary | ICD-10-CM | POA: Diagnosis not present

## 2022-12-18 DIAGNOSIS — Z6826 Body mass index (BMI) 26.0-26.9, adult: Secondary | ICD-10-CM | POA: Diagnosis not present

## 2022-12-18 DIAGNOSIS — E559 Vitamin D deficiency, unspecified: Secondary | ICD-10-CM

## 2022-12-18 DIAGNOSIS — R7303 Prediabetes: Secondary | ICD-10-CM

## 2022-12-18 DIAGNOSIS — E669 Obesity, unspecified: Secondary | ICD-10-CM | POA: Diagnosis not present

## 2022-12-18 DIAGNOSIS — R5383 Other fatigue: Secondary | ICD-10-CM | POA: Diagnosis not present

## 2022-12-18 MED ORDER — METFORMIN HCL 500 MG PO TABS
500.0000 mg | ORAL_TABLET | Freq: Every day | ORAL | 2 refills | Status: DC
Start: 2022-12-18 — End: 2023-03-10

## 2022-12-18 NOTE — Progress Notes (Signed)
.smr  Office: [phone]  /  Fax: [phone]  WEIGHT SUMMARY AND BIOMETRICS  Vitals Temp: 97.6 F (36.4 C) BP: 90/63 Pulse Rate: 60 SpO2: 100 %   Anthropometric Measurements Height: 5\' 2"  (1.575 m) Weight: 144 lb (65.3 kg) BMI (Calculated): 26.33 Weight at Last Visit: 142lb Weight Lost Since Last Visit: 0 Weight Gained Since Last Visit: 2lb Starting Weight: 167lb Total Weight Loss (lbs): 23 lb (10.4 kg)   Body Composition  Body Fat %: 33.8 % Fat Mass (lbs): 49 lbs Muscle Mass (lbs): 91 lbs Total Body Water (lbs): 63.4 lbs Visceral Fat Rating : 6   Other Clinical Data Fasting: yes Labs: yes Today's Visit #: 19 Starting Date: 08/08/21     HPI  Chief Complaint: OBESITY  Carol Welch is here to discuss her progress with her obesity treatment plan. She is on the the Category 1 Plan and keeping a food journal and adhering to recommended goals of 1000-1200 calories and 80+ grams of protein and states she is following her eating plan approximately 80 % of the time. She states she is exercising walking 30-40 minutes 2 times per week.   Interval History:  Since last office visit she is up 2 lbs.  Trying to focus on getting adequate protein.  She traveled to [place] recently and brought her mom who is 64 to live with her family as Mom is not doing well. She is getting back on her nutrition plan and exercise plan.  Hunger/appetite-Some increased hunger with antibiotics she is taking for H. Pylori which she was recently diagnosed with again.  Cravings- not excessive Stress- Some increased stress with mom's illness/move. Mom in law also lives with family.   Exercise-Getting back on track with weight lifting and walking.  Hydration-working on increasing water intake.    Fasting labs obtained today.  She was informed we would discuss her lab results at her next visit unless there is a critical issue that needs to be addressed sooner. She agreed to keep her next visit at the  agreed upon time to discuss these results.   Pharmacotherapy: metformin   TREATMENT PLAN FOR OBESITY: Total weight loss of 23 lbs.  TBW loss of 13.8% since 08/08/2021 !   Recommended Dietary Goals  [name] is currently in the action stage of change. As such, her goal is to continue weight management plan. She has agreed to the Category 1 Plan.  Behavioral Intervention  We discussed the following Behavioral Modification Strategies today: increasing lean protein intake, decreasing simple carbohydrates , increasing vegetables, increasing lower glycemic fruits, increasing fiber rich foods, increasing water intake, emotional eating strategies and understanding the difference between hunger signals and cravings, work on managing stress, creating time for self-care and relaxation measures, continue to practice mindfulness when eating, and planning for success.  Additional resources provided today: Category 1 plan and grocery list  Recommended Physical Activity Goals  Tulani has been advised to work up to 150 minutes of moderate intensity aerobic activity a week and strengthening exercises 2-3 times per week for cardiovascular health, weight loss maintenance and preservation of muscle mass.   She has agreed to Continue current level of physical activity    Pharmacotherapy We discussed various medication options to help Federica with her weight loss efforts and we both agreed to continue metformin for prediabetes.    Return in about 4 weeks (around 01/15/2023).Marland Kitchen She was informed of the importance of frequent follow up visits to maximize her success with intensive lifestyle modifications for her  multiple health conditions.  PHYSICAL EXAM:  Blood pressure 90/63, pulse 60, temperature 97.6 F (36.4 C), height 5\' 2"  (1.575 m), weight 144 lb (65.3 kg), last menstrual period 12/19/2019, SpO2 100%. Body mass index is 26.34 kg/m.  General: She is overweight, cooperative, alert, well developed,  and in no acute distress. PSYCH: Has normal mood, affect and thought process.   Cardiovascular: HR 60's regular, BP 90/63- normal for patient following weight loss.  Lungs: Normal breathing effort, no conversational dyspnea. Neuro: no focal deficits  DIAGNOSTIC DATA REVIEWED:  BMET    Component Value Date/Time   NA 142 07/17/2022 0919   K 4.4 07/17/2022 0919   CL 105 07/17/2022 0919   CO2 23 07/17/2022 0919   GLUCOSE 87 07/17/2022 0919   GLUCOSE 103 (H) 03/13/2021 1117   BUN 20 07/17/2022 0919   CREATININE 0.70 07/17/2022 0919   CALCIUM 9.2 07/17/2022 0919   GFRNONAA >60 03/13/2021 1117   GFRAA >60 01/02/2020 1911   Lab Results  Component Value Date   HGBA1C 5.7 (H) 07/17/2022   HGBA1C 5.6 08/08/2021   Lab Results  Component Value Date   INSULIN 5.5 07/17/2022   INSULIN 5.7 08/08/2021   Lab Results  Component Value Date   TSH 0.499 07/08/2022   CBC    Component Value Date/Time   WBC 5.8 11/01/2021 0944   WBC 7.5 03/13/2021 1117   RBC 4.27 11/01/2021 0944   RBC 4.53 03/13/2021 1117   HGB 12.9 11/01/2021 0944   HCT 38.1 11/01/2021 0944   PLT 223 11/01/2021 0944   MCV 89 11/01/2021 0944   MCH 30.2 11/01/2021 0944   MCH 29.4 03/13/2021 1117   MCHC 33.9 11/01/2021 0944   MCHC 33.1 03/13/2021 1117   RDW 13.5 11/01/2021 0944   Iron Studies No results found for: "IRON", "TIBC", "FERRITIN", "IRONPCTSAT" Lipid Panel     Component Value Date/Time   CHOL 237 (H) 07/17/2022 0919   TRIG 91 07/17/2022 0919   HDL 66 07/17/2022 0919   LDLCALC 155 (H) 07/17/2022 0919   Hepatic Function Panel     Component Value Date/Time   PROT 6.8 07/17/2022 0919   ALBUMIN 4.1 07/17/2022 0919   AST 18 07/17/2022 0919   ALT 10 07/17/2022 0919   ALKPHOS 51 07/17/2022 0919   BILITOT 0.3 07/17/2022 0919   BILIDIR <0.10 05/29/2021 1035   IBILI NOT CALCULATED 03/13/2021 1415      Component Value Date/Time   TSH 0.499 07/08/2022 1636   Nutritional Lab Results  Component Value  Date   VD25OH 49.7 07/17/2022   VD25OH 48.0 02/11/2022   VD25OH 37.3 08/08/2021    ASSOCIATED CONDITIONS ADDRESSED TODAY  ASSESSMENT AND PLAN  Problem List Items Addressed This Visit     Hypothyroidism   Relevant Orders   TSH   Vitamin D insufficiency - Primary   Relevant Orders   VITAMIN D 25 Hydroxy (Vit-D Deficiency, Fractures)   BMI 26.0-26.9,adult   H. pylori infection   Obesity, Beginning BMI 30.54   Relevant Medications   metFORMIN (GLUCOPHAGE) 500 MG tablet   Pre-diabetes   Relevant Medications   metFORMIN (GLUCOPHAGE) 500 MG tablet   Other Relevant Orders   CMP14+EGFR   Hemoglobin A1c   Insulin, random   Lipid Panel With LDL/HDL Ratio   Other fatigue   Relevant Orders   Vitamin B12   CBC with Differential/Platelet  Vitamin D Deficiency Vitamin D is at goal of 50.  Most recent vitamin D level was 49.7  nearly at goal when taking prescription and OTC medication. She is on OTC vitamin D3 1000 IU daily. Lab Results  Component Value Date   VD25OH 49.7 07/17/2022   VD25OH 48.0 02/11/2022   VD25OH 37.3 08/08/2021    Plan: Continue OTC vitamin D3 1000 IU daily Recheck vitamin D level today.  May require prescription again based on results. Low vitamin D levels can be associated with adiposity and may result in leptin resistance and weight gain. Also associated with fatigue. Currently on vitamin D supplementation without any adverse effects.   Hypothyroidism Stable.  Does not report symptoms associated with uncontrolled hypothyroidism. Medication(s): Levothyroxine 75 mcg daily. No side effects with medication, but notes fatigue.  Lab Results  Component Value Date   TSH 0.499 07/08/2022    Plan: Continue levothyroxine at current dose. Recheck TSH level today.   Prediabetes Last A1c was 5.7- not at goal. Insulin 5.5 near goal.   Medication(s): Metformin 500 mg once daily breakfast Polyphagia:No She is working on nutrition plan to decrease simple  carbohydrates, increase lean proteins and exercise to promote weight loss, improve glycemic control and prevent progression to Type 2 diabetes.   Lab Results  Component Value Date   HGBA1C 5.7 (H) 07/17/2022   HGBA1C 5.6 08/08/2021   Lab Results  Component Value Date   INSULIN 5.5 07/17/2022   INSULIN 5.7 08/08/2021    Plan: Continue and refill Metformin 500 mg once daily breakfast Continue working on nutrition plan to decrease simple carbohydrates, increase lean proteins and exercise to promote weight loss, improve glycemic control and prevent progression to Type 2 diabetes.  Recheck fasting labs today.   Other fatigue;  Endorses fatigue. Has been on vitamin D and B 12. Plan: Recheck labs today and supplement accordingly.    H Pylori infection: Patient is undergoing another round of antibiotics and notes some increased hunger with this treatment.  Plan: Continue h pylori treatments as prescribed and monitor for resolution.   ATTESTASTION STATEMENTS:  Reviewed by clinician on day of visit: allergies, medications, problem list, medical history, surgical history, family history, social history, and previous encounter notes.   I have personally spent 38 minutes total time today in preparation, patient care, nutritional counseling and documentation for this visit, including the following: review of clinical lab tests; review of medical tests/procedures/services.      Nereyda Bowler, PA-C

## 2022-12-20 LAB — CMP14+EGFR
ALT: 7 IU/L (ref 0–32)
AST: 13 IU/L (ref 0–40)
Albumin: 4.3 g/dL (ref 3.9–4.9)
Alkaline Phosphatase: 73 IU/L (ref 44–121)
BUN/Creatinine Ratio: 19 (ref 9–23)
BUN: 14 mg/dL (ref 6–24)
Bilirubin Total: 0.3 mg/dL (ref 0.0–1.2)
CO2: 24 mmol/L (ref 20–29)
Calcium: 9.3 mg/dL (ref 8.7–10.2)
Chloride: 102 mmol/L (ref 96–106)
Creatinine, Ser: 0.72 mg/dL (ref 0.57–1.00)
Globulin, Total: 3 g/dL (ref 1.5–4.5)
Glucose: 83 mg/dL (ref 70–99)
Potassium: 4.5 mmol/L (ref 3.5–5.2)
Sodium: 138 mmol/L (ref 134–144)
Total Protein: 7.3 g/dL (ref 6.0–8.5)
eGFR: 105 mL/min/{1.73_m2} (ref >59)

## 2022-12-20 LAB — CBC WITH DIFFERENTIAL/PLATELET
Basophils Absolute: 0 10*3/uL (ref 0.0–0.2)
Basos: 1 %
EOS (ABSOLUTE): 0.1 10*3/uL (ref 0.0–0.4)
Eos: 1 %
Hematocrit: 39.2 % (ref 34.0–46.6)
Hemoglobin: 13.1 g/dL (ref 11.1–15.9)
Immature Grans (Abs): 0 10*3/uL (ref 0.0–0.1)
Immature Granulocytes: 0 %
Lymphocytes Absolute: 2.9 10*3/uL (ref 0.7–3.1)
Lymphs: 46 %
MCH: 29.6 pg (ref 26.6–33.0)
MCHC: 33.4 g/dL (ref 31.5–35.7)
MCV: 89 fL (ref 79–97)
Monocytes Absolute: 0.4 10*3/uL (ref 0.1–0.9)
Monocytes: 6 %
Neutrophils Absolute: 2.8 10*3/uL (ref 1.4–7.0)
Neutrophils: 46 %
Platelets: 280 10*3/uL (ref 150–450)
RBC: 4.42 x10E6/uL (ref 3.77–5.28)
RDW: 13 % (ref 11.7–15.4)
WBC: 6.2 10*3/uL (ref 3.4–10.8)

## 2022-12-20 LAB — LIPID PANEL WITH LDL/HDL RATIO
Cholesterol, Total: 251 mg/dL — ABNORMAL HIGH (ref 100–199)
HDL: 70 mg/dL (ref >39)
LDL Chol Calc (NIH): 166 mg/dL — ABNORMAL HIGH (ref 0–99)
LDL/HDL Ratio: 2.4 ratio (ref 0.0–3.2)
Triglycerides: 89 mg/dL (ref 0–149)
VLDL Cholesterol Cal: 15 mg/dL (ref 5–40)

## 2022-12-20 LAB — HEMOGLOBIN A1C
Est. average glucose Bld gHb Est-mCnc: 120 mg/dL
Hgb A1c MFr Bld: 5.8 % — ABNORMAL HIGH (ref 4.8–5.6)

## 2022-12-20 LAB — INSULIN, RANDOM: INSULIN: 2.7 u[IU]/mL (ref 2.6–24.9)

## 2022-12-20 LAB — TSH: TSH: 3.44 u[IU]/mL (ref 0.450–4.500)

## 2022-12-20 LAB — VITAMIN B12: Vitamin B-12: 1155 pg/mL (ref 232–1245)

## 2022-12-20 LAB — VITAMIN D 25 HYDROXY (VIT D DEFICIENCY, FRACTURES): Vit D, 25-Hydroxy: 41.4 ng/mL (ref 30.0–100.0)

## 2022-12-23 DIAGNOSIS — N6452 Nipple discharge: Secondary | ICD-10-CM | POA: Diagnosis not present

## 2022-12-24 ENCOUNTER — Encounter: Payer: Self-pay | Admitting: Physical Therapy

## 2022-12-24 ENCOUNTER — Encounter: Payer: Medicaid Other | Admitting: Physical Therapy

## 2022-12-24 ENCOUNTER — Other Ambulatory Visit (HOSPITAL_COMMUNITY): Payer: Self-pay

## 2022-12-24 DIAGNOSIS — M6281 Muscle weakness (generalized): Secondary | ICD-10-CM | POA: Diagnosis not present

## 2022-12-24 DIAGNOSIS — R279 Unspecified lack of coordination: Secondary | ICD-10-CM | POA: Diagnosis not present

## 2022-12-24 DIAGNOSIS — M62838 Other muscle spasm: Secondary | ICD-10-CM

## 2022-12-24 DIAGNOSIS — R102 Pelvic and perineal pain: Secondary | ICD-10-CM

## 2022-12-24 NOTE — Telephone Encounter (Signed)
Called and let patient know she can get the tetracycline from pharmacy. She confirmed understanding

## 2022-12-24 NOTE — Therapy (Signed)
OUTPATIENT PHYSICAL THERAPY FEMALE PELVIC EVALUATION   Patient Name: Carol Welch MRN: [id number] DOB:Jul 21, 1975, 47 y.o., female Today's Date: 12/24/2022  END OF SESSION:  PT End of Session - 12/24/22 1407     Visit Number 3    Date for PT Re-Evaluation 02/25/23    Authorization Type [employer]    Authorization Time Period 12/03/2022-02/01/2023    Authorization - Visit Number 2    Authorization - Number of Visits 10    PT Start Time 1400    PT Stop Time 1445    PT Time Calculation (min) 45 min    Activity Tolerance Patient tolerated treatment well    Behavior During Therapy WFL for tasks assessed/performed             Past Medical History:  Diagnosis Date   Anal fissure    Colitis    Depression    Edema, lower extremity    Fatty liver    Hypothyroidism    Low blood pressure    PONV (postoperative nausea and vomiting)    SVD (spontaneous vaginal delivery) 06/30/2015   Vitamin D deficiency    Past Surgical History:  Procedure Laterality Date   BREAST BIOPSY Right 07/09/2022   [place] RT BREAST BX W LOC DEV 1ST LESION IMG BX SPEC US GUIDE 07/09/2022 [facility]   BREAST CYST ASPIRATION Right 02/13/2018   CYSTOSCOPY N/A 12/30/2019   Procedure: CYSTOSCOPY;  Surgeon: Sherian Rein, MD;  Location: [facility];  Service: Gynecology;  Laterality: N/A;   ENDOVENOUS ABLATION SAPHENOUS VEIN W/ LASER Right 10/08/2018   endovenous laser ablation right saphenous vein by Waverly Ferrari MD   LAPAROSCOPIC VAGINAL HYSTERECTOMY WITH SALPINGECTOMY Bilateral 12/30/2019   Procedure: LAPAROSCOPIC ASSISTED VAGINAL HYSTERECTOMY WITH SALPINGECTOMY;  Surgeon: Sherian Rein, MD;  Location: [facility];  Service: Gynecology;  Laterality: Bilateral;   Patient Active Problem List   Diagnosis Date Noted   Pre-diabetes 12/18/2022   Other fatigue 12/18/2022   Worried well 10/09/2022   Obesity, Beginning BMI 30.54 10/09/2022   Genetic testing  10/02/2022   Monoallelic mutation of CDKN2A gene 10/02/2022   Vaginal itching 10/01/2022   H. 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   Neck pain 01/18/2020   S/P laparoscopic assisted vaginal hysterectomy (LAVH) 12/30/2019   External thrombosed hemorrhoids 09/11/2019   MDD (major depressive disorder) 08/24/2019   Orthostatic hypotension 08/24/2019   Hypothyroidism 07/06/2018   Family history of congenital anomaly 08/27/2016    PCP: Tiffany Kocher, DO  REFERRING PROVIDER: Benancio Deeds, MD   REFERRING DIAG:  R10.13 (ICD-10-CM) - Postprandial epigastric pain  K92.0 (ICD-10-CM) - Hematemesis with nausea  K59.04 (ICD-10-CM) - Chronic idiopathic constipation  N94.10 (ICD-10-CM) - Dyspareunia in female    THERAPY DIAG:  Other muscle spasm  Pelvic pain  Muscle weakness (generalized)  Rationale for Evaluation and Treatment: Rehabilitation  ONSET DATE: 2022  SUBJECTIVE:  SUBJECTIVE STATEMENT: I urinate 4-5 times at night but now 2-3 times since last visit. I can hold my urine longer.  No questions.   PAIN:  Are you having pain? Yes NPRS scale: 6/10 Pain location:  lower abdomen  Pain type: bloating, heaviness, bloating Pain description: intermittent , happens 80% of the time  Aggravating factors: running, not having a bowel movement,  Relieving factors: lay down and stroke stomach upward  PRECAUTIONS: None  RED FLAGS: None   WEIGHT BEARING RESTRICTIONS:  No  FALLS:  Has patient fallen in last 6 months? No  LIVING ENVIRONMENT: Lives with: lives with their family  OCCUPATION: walking, weights  PLOF: Independent  PATIENT GOALS: reduce discomfort, easier to have a bowel movement  PERTINENT HISTORY:  Anal fissure; Colitis; Hypothyroidism;   BOWEL MOVEMENT: Pain with bowel movement: Yes, intermittent 5/10 Type of bowel movement:Type (Bristol Stool Scale) type 4, Frequency daily, Strain Yes, and Splinting none Fully empty rectum: No Leakage: No Fiber supplement: Yes: Metamucil  URINATION: Pain with urination: No Fully empty bladder: Yes: but at times feels she has more urine left and sit back down to empty bladder further Stream: Strong Urgency: No Frequency: average Leakage:  none Pads: No  INTERCOURSE: Pain with intercourse: Initial Penetration and During Penetration, Patient feels a little spot hurts that is upward to the left Ability to have vaginal penetration:  Yes:   Climax: sometimes, struggle since hysterectectomy Marinoff Scale: 2/3  PREGNANCY: Vaginal deliveries 5, last delivery 03/2018 Tearing did have tearing with the first few and needed stitches, did not have stitches on fourth but had tearing   PROLAPSE: None   OBJECTIVE:   DIAGNOSTIC FINDINGS:  none  PATIENT SURVEYS:  PFIQ-7 133; CRAIQ-7: 71; POPIQ-7 62  COGNITION: Overall cognitive status: Within functional limits for tasks assessed      POSTURE: No Significant postural limitations  PELVIC ALIGNMENT: right ilium anteriorly rotated  LUMBARAROM/PROM:  A/PROM A/PROM  eval  Flexion Decreased by 25%  Extension Decreased by 50%  Right lateral flexion Decreased by 25%  Left lateral flexion full  Right rotation Decreased by 25%  Left rotation Decreased by 25%   (Blank rows = not tested)  LOWER EXTREMITY ROM:  Passive ROM Right eval Left eval  Hip external rotation 40 40   (Blank rows = not tested)  LOWER EXTREMITY MMT:  MMT  Right eval  Hip flexion 5/5  Hip extension 4/5  Hip abduction 3/5  Hip adduction 4/5   PALPATION:   General  throughout the abdomen, decreased diaphragm mobility and lower rib cage mobility                External Perineal Exam along the posterior 1/3 of the external rectum; not able to do internal through the rectum due to pain and tightness, tenderness located on bil. Ischiocavernosus and bulbocavernosus                             Internal Pelvic Floor tenderness located in bilateral obturator internist, levator ani, and above the bladder on the anterior wall  Patient confirms identification and approves PT to assess internal pelvic floor and treatment Yes  PELVIC MMT:   MMT eval  Vaginal 2/5  Internal Anal Sphincter   External Anal Sphincter   Puborectalis   Diastasis Recti   (Blank rows = not tested)        TONE: increased  PROLAPSE: Anterior wall weakness just above the  introitus, posterior wall weakness just above the introitus  TODAY'S TREATMENT:  12/24/22 Manual: Soft tissue mobilization: To assess for dry needling  Manual work to the lumbar paraspinals, quadratus, along the obliques Myofascial release: Quadruped therapist pulling the posterior tissue forward then pull the abdominal forward to take the organs off the back wall Quadruped pulling up from the pubic bone and patient going forward and back then diagonals Trigger Point Dry-Needling  Treatment instructions: Expect mild to moderate muscle soreness. S/S of pneumothorax if dry needled over a lung field, and to seek immediate medical attention should they occur. Patient verbalized understanding of these instructions and education.  Patient Consent Given: Yes Education handout provided: Yes Muscles treated: lumbar multifidi, Quadratus lumborum Electrical stimulation performed: No Parameters: N/A Treatment response/outcome: elongation of muscle and trigger point  response Exercises: Stretches/mobility: Double knee to chest holding 30 sec Trunk rotation pulling the knee across the body Educated patient on abdominal massage.     12/17/22 Manual: Soft tissue mobilization: Manual work to the abdomen, diaphragm, obliques, and rectus Educated patient on tissue rolling of abdomen, pulling up on the lower abdomen, and pulling from the back to the front Myofascial release: Release of the lower abdomen to reduce the restrictions Release of the mesenteric root, mid abdominal area Tissue rolling of the abdomen Exercises: Stretches/mobility: Bilateral hamstring stretch holding 30 sec supine Supine pull knee across the trunk for 30 bil.  Happy Baby holding 30 sec  Cat cow 15 x  Childs pose holding 30 sec Prone press up 5 times Quadruped thread the needle with full thoracic rotation                                                                                                                                DATE: 12/03/22  EVAL completed the evaluation  PATIENT EDUCATION: 12/24/22 Education details: Access Code: [email or web address], information on dry needling Person educated: Patient Education method: Explanation, Demonstration, Tactile cues, Verbal cues, and Handouts Education comprehension: verbalized understanding, returned demonstration, verbal cues required, tactile cues required, and needs further education    HOME EXERCISE PROGRAM: 12/17/22 Access Code: [email or web address] URL: [email or web address] Date: 12/17/2022 Prepared by: Eulis Foster  Exercises - Supine Piriformis Stretch with Leg Straight  - 1 x daily - 7 x weekly - 1 sets - 2 reps - 30 sec hold - Supine Hamstring Stretch  - 1 x daily - 7 x weekly - 1 sets - 2 reps - 30 sec hold - Happy Baby with Pelvic Floor Lengthening  - 1 x daily - 7 x weekly - 1 sets - 1 reps - 30 sec hold - Cat Cow  - 1 x daily - 7 x weekly - 1 sets - 10 reps - Child's Pose Stretch  - 1 x daily - 7 x weekly - 1  sets - 1 reps - 30 sec hold - Quadruped Full Range Thoracic Rotation with Reach  - 1 x daily -  7 x weekly - 2 reps - 5 sec hold - Prone Press Up  - 1 x daily - 7 x weekly - 1 sets - 10 reps  ASSESSMENT:  CLINICAL IMPRESSION: Patient is a 47 y.o. female who was seen today for physical therapy  treatment for dyspareunia and constipation.  Patient used to urinate 4-5 times at night but now 2-3 times at night since last visit. She is able to hold her urine longer. Today her abdomen was softer and reduction of fascial restrictions. She had increased mobility of her lumbar spine after manual work. Patient will benefit from skilled therapy to relax her pelvic floor muscles to improve defecation and vaginal penetration.   OBJECTIVE IMPAIRMENTS: decreased coordination, decreased strength, increased fascial restrictions, increased muscle spasms, and pain.   ACTIVITY LIMITATIONS: toileting  PARTICIPATION LIMITATIONS: interpersonal relationship  PERSONAL FACTORS: 1 comorbidity: Anal fissure; Colitis; Hypothyroidism;  are also affecting patient's functional outcome.   REHAB POTENTIAL: Good  CLINICAL DECISION MAKING: Stable/uncomplicated  EVALUATION COMPLEXITY: Low   GOALS: Goals reviewed with patient? Yes  SHORT TERM GOALS: Target date: 12/30/22  Patient is independent with initial HEP.  Baseline:Not educated yet Goal status: Met 12/24/22   LONG TERM GOALS: Target date: 02/25/23  Patient is independent with advanced HEP for stretches and core strength.  Baseline: not educated yet Goal status: INITIAL  2.  Patient is independent with abdominal massage to improve defecation.  Baseline: Not educated yet Goal status: INITIAL  3.  Patient is abel to defecate with pain level </= 1/10 and straining reduced >/= 75% due to improved lengthening of the muscles.  Baseline: pain level 5/10 and has to strain.  Goal status: INITIAL  4.  Patient is able to have penile penetration vaginally with pain  level </= 1-2/10.  Baseline: Pain level 5/10 Goal status: INITIAL  5.  Patient is able to fully empty her bladder due to the ability to relax her pelvic floor.  Baseline: Has to sit 2 times Goal status: INITIAL    PLAN:  PT FREQUENCY: 1x/week  PT DURATION: 12 weeks  PLANNED INTERVENTIONS: Therapeutic exercises, Therapeutic activity, Neuromuscular re-education, Patient/Family education, Joint mobilization, Aquatic Therapy, Electrical stimulation, Spinal mobilization, Cryotherapy, Moist heat, Ultrasound, Biofeedback, and Manual therapy  PLAN FOR NEXT SESSION:  manual work to pelvic floor, dry needling to pelvic floor ischiocavernosus, bulbocavernosus, perineal body and levator ani; quadruped lift extremity     Eulis Foster, PT 12/24/22 2:08 PM

## 2022-12-24 NOTE — Telephone Encounter (Signed)
Pharmacy Patient Advocate Encounter  Received notification from [facility] that Prior Authorization for Tetracycline has been APPROVED from 12/16/2022 to 12/30/2022. Ran test claim, Copay is $4.00. This test claim was processed through [facility]- copay amounts may vary at other pharmacies due to pharmacy/plan contracts, or as the patient moves through the different stages of their insurance plan.

## 2022-12-25 DIAGNOSIS — F331 Major depressive disorder, recurrent, moderate: Secondary | ICD-10-CM | POA: Diagnosis not present

## 2022-12-31 ENCOUNTER — Encounter: Payer: Self-pay | Admitting: Physical Therapy

## 2022-12-31 ENCOUNTER — Encounter: Payer: Medicaid Other | Admitting: Physical Therapy

## 2022-12-31 DIAGNOSIS — M62838 Other muscle spasm: Secondary | ICD-10-CM

## 2022-12-31 DIAGNOSIS — R279 Unspecified lack of coordination: Secondary | ICD-10-CM | POA: Diagnosis not present

## 2022-12-31 DIAGNOSIS — R102 Pelvic and perineal pain: Secondary | ICD-10-CM | POA: Diagnosis not present

## 2022-12-31 DIAGNOSIS — M6281 Muscle weakness (generalized): Secondary | ICD-10-CM | POA: Diagnosis not present

## 2022-12-31 NOTE — Therapy (Signed)
OUTPATIENT PHYSICAL THERAPY FEMALE PELVIC EVALUATION   Patient Name: Carol Welch MRN: [id number] DOB:Jan 24, 1976, 47 y.o., female Today's Date: 12/31/2022  END OF SESSION:  PT End of Session - 12/31/22 1309     Visit Number 4    Date for PT Re-Evaluation 02/25/23    Authorization Type [employer]    Authorization Time Period 12/03/2022-02/01/2023    Authorization - Visit Number 4    Authorization - Number of Visits 10    PT Start Time 1408    PT Stop Time 1450    PT Time Calculation (min) 42 min    Activity Tolerance Patient tolerated treatment well    Behavior During Therapy WFL for tasks assessed/performed             Past Medical History:  Diagnosis Date   Anal fissure    Colitis    Depression    Edema, lower extremity    Fatty liver    Hypothyroidism    Low blood pressure    PONV (postoperative nausea and vomiting)    SVD (spontaneous vaginal delivery) 06/30/2015   Vitamin D deficiency    Past Surgical History:  Procedure Laterality Date   BREAST BIOPSY Right 07/09/2022   [place] RT BREAST BX W LOC DEV 1ST LESION IMG BX SPEC US GUIDE 07/09/2022 [facility]   BREAST CYST ASPIRATION Right 02/13/2018   CYSTOSCOPY N/A 12/30/2019   Procedure: CYSTOSCOPY;  Surgeon: Sherian Rein, MD;  Location: [facility];  Service: Gynecology;  Laterality: N/A;   ENDOVENOUS ABLATION SAPHENOUS VEIN W/ LASER Right 10/08/2018   endovenous laser ablation right saphenous vein by Waverly Ferrari MD   LAPAROSCOPIC VAGINAL HYSTERECTOMY WITH SALPINGECTOMY Bilateral 12/30/2019   Procedure: LAPAROSCOPIC ASSISTED VAGINAL HYSTERECTOMY WITH SALPINGECTOMY;  Surgeon: Sherian Rein, MD;  Location: [facility];  Service: Gynecology;  Laterality: Bilateral;   Patient Active Problem List   Diagnosis Date Noted   Pre-diabetes 12/18/2022   Other fatigue 12/18/2022   Worried well 10/09/2022   Obesity, Beginning BMI 30.54 10/09/2022   Genetic testing  10/02/2022   Monoallelic mutation of CDKN2A gene 10/02/2022   Vaginal itching 10/01/2022   H. 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   Neck pain 01/18/2020   S/P laparoscopic assisted vaginal hysterectomy (LAVH) 12/30/2019   External thrombosed hemorrhoids 09/11/2019   MDD (major depressive disorder) 08/24/2019   Orthostatic hypotension 08/24/2019   Hypothyroidism 07/06/2018   Family history of congenital anomaly 08/27/2016    PCP: Tiffany Kocher, DO  REFERRING PROVIDER: Benancio Deeds, MD   REFERRING DIAG:  R10.13 (ICD-10-CM) - Postprandial epigastric pain  K92.0 (ICD-10-CM) - Hematemesis with nausea  K59.04 (ICD-10-CM) - Chronic idiopathic constipation  N94.10 (ICD-10-CM) - Dyspareunia in female    THERAPY DIAG:  Other muscle spasm  Pelvic pain  Rationale for Evaluation and Treatment: Rehabilitation  ONSET DATE: 2022  SUBJECTIVE:  SUBJECTIVE STATEMENT: I have had increased pain the last few days. I felt increased relief in the back after last visit. I was able to garden with less back pain. I do the lower abdominal massage and it helps some.  I urinate 4-5 times at night but now 2-3 times since last visit. I can hold my urine longer.  No questions.   PAIN:  Are you having pain? Yes NPRS scale: 6/10 Pain location:  lower abdomen  Pain type: bloating, heaviness, bloating Pain description: intermittent , happens 80% of the time  Aggravating factors: running, not  having a bowel movement,  Relieving factors: lay down and stroke stomach upward  PRECAUTIONS: None  RED FLAGS: None   WEIGHT BEARING RESTRICTIONS: No  FALLS:  Has patient fallen in last 6 months? No  LIVING ENVIRONMENT: Lives with: lives with their family  OCCUPATION: walking, weights  PLOF: Independent  PATIENT GOALS: reduce discomfort, easier to have a bowel movement  PERTINENT HISTORY:  Anal fissure; Colitis; Hypothyroidism;   BOWEL MOVEMENT: Pain with bowel movement: Yes, intermittent 5/10 Type of bowel movement:Type (Bristol Stool Scale) type 4, Frequency daily, Strain Yes, and Splinting none Fully empty rectum: No Leakage: No Fiber supplement: Yes: Metamucil  URINATION: Pain with urination: No Fully empty bladder: Yes: but at times feels she has more urine left and sit back down to empty bladder further Stream: Strong Urgency: No Frequency: average Leakage:  none Pads: No  INTERCOURSE: Pain with intercourse: Initial Penetration and During Penetration, Patient feels a little spot hurts that is upward to the left Ability to have vaginal penetration:  Yes:   Climax: sometimes, struggle since hysterectectomy Marinoff Scale: 2/3  PREGNANCY: Vaginal deliveries 5, last delivery 03/2018 Tearing did have tearing with the first few and needed stitches, did not have stitches on fourth but had tearing   PROLAPSE: None   OBJECTIVE:   DIAGNOSTIC FINDINGS:  none  PATIENT SURVEYS:  PFIQ-7 133; CRAIQ-7: 71; POPIQ-7 62 12/31/22 PFIQ-7 181; CRAIQ-7: 86; POPIQ-7 95  COGNITION: Overall cognitive status: Within functional limits for tasks assessed      POSTURE: No Significant postural limitations  PELVIC ALIGNMENT: right ilium anteriorly rotated  LUMBARAROM/PROM:  A/PROM A/PROM  eval 12/31/22  Flexion Decreased by 25% full  Extension Decreased by 50% full  Right lateral flexion Decreased by 25% Decreased by 25%  Left lateral flexion full full  Right  rotation Decreased by 25% Decreased by 25%  Left rotation Decreased by 25% Decreased by 25%   (Blank rows = not tested)  LOWER EXTREMITY ROM:  Passive ROM Right eval Left eval  Hip external rotation 40 40   (Blank rows = not tested)  LOWER EXTREMITY MMT:  MMT Right eval Right  12/31/22  Hip flexion 5/5 5/5  Hip extension 4/5 4/5  Hip abduction 3/5 3/5  Hip adduction 4/5 3/5   PALPATION:   General  throughout the abdomen, decreased diaphragm mobility and lower rib cage mobility                External Perineal Exam along the posterior 1/3 of the external rectum; not able to do internal through the rectum due to pain and tightness, tenderness located on bil. Ischiocavernosus and bulbocavernosus                             Internal Pelvic Floor tenderness located in bilateral obturator internist, levator ani, and above the bladder on the anterior  wall  Patient confirms identification and approves PT to assess internal pelvic floor and treatment Yes  PELVIC MMT:   MMT eval  Vaginal 2/5  Internal Anal Sphincter   External Anal Sphincter   Puborectalis   Diastasis Recti   (Blank rows = not tested)        TONE: increased  PROLAPSE: Anterior wall weakness just above the introitus, posterior wall weakness just above the introitus  TODAY'S TREATMENT:  12/31/22 Manual: Myofascial release: Fascial release along the urogenital diaphragm going slowly through the restrictions Release of the obturator internists with one hand above the pubic rami and other along the obturator foreman.  Internal pelvic floor techniques: No emotional/communication barriers or cognitive limitation. Patient is motivated to learn. Patient understands and agrees with treatment goals and plan. PT explains patient will be examined in standing, sitting, and lying down to see how their muscles and joints work. When they are ready, they will be asked to remove their underwear so PT can examine their  perineum. The patient is also given the option of providing their own chaperone as one is not provided in our facility. The patient also has the right and is explained the right to defer or refuse any part of the evaluation or treatment including the internal exam. With the patient's consent, PT will use one gloved finger to gently assess the muscles of the pelvic floor, seeing how well it contracts and relaxes and if there is muscle symmetry. After, the patient will get dressed and PT and patient will discuss exam findings and plan of care. PT and patient discuss plan of care, schedule, attendance policy and HEP activities.  Manual work along the labia majora and ischiocavernosus going slowly and monitoring for pain and relaxation of the area Release along the bulbocavernosus externally    12/24/22 Manual: Soft tissue mobilization: To assess for dry needling  Manual work to the lumbar paraspinals, quadratus, along the obliques Myofascial release: Quadruped therapist pulling the posterior tissue forward then pull the abdominal forward to take the organs off the back wall Quadruped pulling up from the pubic bone and patient going forward and back then diagonals Trigger Point Dry-Needling  Treatment instructions: Expect mild to moderate muscle soreness. S/S of pneumothorax if dry needled over a lung field, and to seek immediate medical attention should they occur. Patient verbalized understanding of these instructions and education.  Patient Consent Given: Yes Education handout provided: Yes Muscles treated: lumbar multifidi, Quadratus lumborum Electrical stimulation performed: No Parameters: N/A Treatment response/outcome: elongation of muscle and trigger point response Exercises: Stretches/mobility: Double knee to chest holding 30 sec Trunk rotation pulling the knee across the body Educated patient on abdominal massage.     12/17/22 Manual: Soft tissue mobilization: Manual work to the  abdomen, diaphragm, obliques, and rectus Educated patient on tissue rolling of abdomen, pulling up on the lower abdomen, and pulling from the back to the front Myofascial release: Release of the lower abdomen to reduce the restrictions Release of the mesenteric root, mid abdominal area Tissue rolling of the abdomen Exercises: Stretches/mobility: Bilateral hamstring stretch holding 30 sec supine Supine pull knee across the trunk for 30 bil.  Happy Baby holding 30 sec  Cat cow 15 x  Childs pose holding 30 sec Prone press up 5 times Quadruped thread the needle with full thoracic rotation  DATE: 12/03/22  EVAL completed the evaluation  PATIENT EDUCATION: 12/24/22 Education details: Access Code: [email or web address], information on dry needling Person educated: Patient Education method: Explanation, Demonstration, Tactile cues, Verbal cues, and Handouts Education comprehension: verbalized understanding, returned demonstration, verbal cues required, tactile cues required, and needs further education    HOME EXERCISE PROGRAM: 12/17/22 Access Code: [email or web address] URL: [email or web address] Date: 12/17/2022 Prepared by: Eulis Foster  Exercises - Supine Piriformis Stretch with Leg Straight  - 1 x daily - 7 x weekly - 1 sets - 2 reps - 30 sec hold - Supine Hamstring Stretch  - 1 x daily - 7 x weekly - 1 sets - 2 reps - 30 sec hold - Happy Baby with Pelvic Floor Lengthening  - 1 x daily - 7 x weekly - 1 sets - 1 reps - 30 sec hold - Cat Cow  - 1 x daily - 7 x weekly - 1 sets - 10 reps - Child's Pose Stretch  - 1 x daily - 7 x weekly - 1 sets - 1 reps - 30 sec hold - Quadruped Full Range Thoracic Rotation with Reach  - 1 x daily - 7 x weekly - 2 reps - 5 sec hold - Prone Press Up  - 1 x daily - 7 x weekly - 1 sets - 10 reps  ASSESSMENT:  CLINICAL IMPRESSION: Patient  is a 47 y.o. female who was seen today for physical therapy  treatment for dyspareunia and constipation.  Patient used to urinate 4-5 times at night but now 2-3 times at night since last visit. She is able to hold her urine longer. She had reduction of pain after manual work. She has increased trigger points in the ischiocavernosus, bulbocavernosus and obturator internist. She is having increased pain this week in her lower abdomen after gardening with bloating. She was able to tolerate gardening with less back pain after the manual work from prior visit.  Patient continues to perform her manual work on the abdomen. She continues to have rectal pain. Patient will benefit from skilled therapy to relax her pelvic floor muscles to improve defecation and vaginal penetration.   OBJECTIVE IMPAIRMENTS: decreased coordination, decreased strength, increased fascial restrictions, increased muscle spasms, and pain.   ACTIVITY LIMITATIONS: toileting  PARTICIPATION LIMITATIONS: interpersonal relationship  PERSONAL FACTORS: 1 comorbidity: Anal fissure; Colitis; Hypothyroidism;  are also affecting patient's functional outcome.   REHAB POTENTIAL: Good  CLINICAL DECISION MAKING: Stable/uncomplicated  EVALUATION COMPLEXITY: Low   GOALS: Goals reviewed with patient? Yes  SHORT TERM GOALS: Target date: 12/30/22  Patient is independent with initial HEP.  Baseline:Not educated yet Goal status: Met 12/24/22   LONG TERM GOALS: Target date: 02/25/23  Patient is independent with advanced HEP for stretches and core strength.  Baseline: not educated yet Goal status: INITIAL  2.  Patient is independent with abdominal massage to improve defecation.  Baseline: Not educated yet Goal status: met 12/31/22  3.  Patient is abel to defecate with pain level </= 1/10 and straining reduced >/= 75% due to improved lengthening of the muscles.  Baseline: pain level 5/10 and has to strain.  Goal status: INITIAL  4.   Patient is able to have penile penetration vaginally with pain level </= 1-2/10.  Baseline: Pain level 5/10 Goal status: INITIAL  5.  Patient is able to fully empty her bladder due to the ability to relax her pelvic floor.  Baseline: Has to sit 2 times Goal status: INITIAL    PLAN:  PT  FREQUENCY: 1x/week  PT DURATION: 12 weeks  PLANNED INTERVENTIONS: Therapeutic exercises, Therapeutic activity, Neuromuscular re-education, Patient/Family education, Joint mobilization, Aquatic Therapy, Electrical stimulation, Spinal mobilization, Cryotherapy, Moist heat, Ultrasound, Biofeedback, and Manual therapy  PLAN FOR NEXT SESSION:  manual work to pelvic floor, dry needling to pelvic floor ischiocavernosus, bulbocavernosus, perineal body and levator ani; quadruped lift extremity     Eulis Foster, PT 12/31/22 2:53 PM

## 2023-01-01 NOTE — Telephone Encounter (Signed)
Brooklyn to clarify, did she take the rest of the regimen for H. pylori with out taking the tetracycline?  If so that would be inadequate to treat H. pylori and I think she unfortunately is going to have to take it all over again.   Recommending she repeat the following regimen at this point to treat the H pylori, she has to take both of the antibiotics with this regimen to achieve eradication.   Omeprazole protonix 40mg  BID  x 14 d  Pepto Bismol 2 tabs (262 mg each) 4 times a day x 14 d  Metronidazole 250 mg 4 times a day x 14 d  Tetracycline 500mg   4 times a day x 14 d   1 month after therapy she should have another H. pylori Diatherix stool swab to check for eradication.  Thanks

## 2023-01-01 NOTE — Telephone Encounter (Signed)
Inbound call from patient stating pharmacy has not received for prescription for medication. Patient requesting a call back to discuss further. Please advise, thank you.

## 2023-01-01 NOTE — Telephone Encounter (Signed)
Called and spoke with patient. I told pt that the Tetracycline was only approved for a short period of time and if claim was attempted after approval period she would not be able to get the medication. Pt states that she went to the pharmacy several times when we called her and kept being told that they did not have authorization from our office. Pt informed me that she already completed other medications that were a part of this quad therapy. Not sure if treatment is effective at this point since she never received Tetracycline. If you still wish for patient to have Tetracycline I will need to send to pharmacy for updated PA. Please advise on plan, thanks.

## 2023-01-01 NOTE — Telephone Encounter (Signed)
PA team, will you please initiate another PA for Tetracycline? Or get an extension on previous approval as patient was not able to obtain during previous approval timeframe? Please keep me updated. Thanks

## 2023-01-02 ENCOUNTER — Telehealth: Payer: Self-pay

## 2023-01-02 ENCOUNTER — Other Ambulatory Visit (HOSPITAL_COMMUNITY): Payer: Self-pay

## 2023-01-02 NOTE — Telephone Encounter (Signed)
It looks as if the pharmacy is the one who requested the prior authorization to start with. Not sure if she was being told that it was on backorder or needed to be ordered and mistook it that they don't have the prescription. I will get started on a new request and a new encounter will be created.

## 2023-01-02 NOTE — Telephone Encounter (Signed)
Pharmacy Patient Advocate Encounter  Received notification from [facility] Medicaid that Prior Authorization for Tetracycline HCl 500MG  capsules has been DENIED.  Full denial letter will be uploaded to the media tab. See denial reason below.  Per the health plan's preferred drug list, at least two preferred drugs must be tried before requesting this drug or tell us why the member cannot try any preferred alternatives. Please send [place] supporting chart notes and lab results. Here is list of preferred alternatives: doxycycline hyclate capsule / tablet (generic for Vibramycin, Vibra-Tab), doxycycline monohydrate 50mg , 100mg  capsule (generic for Monodox), minocycline 50mg , 75mg , 100mg  capsule (generic for Minocin).  Note: Some preferred drug(s) may have quantity limits. Refer to the health plan's preferred drug list for additional details.   PA #/Case ID/Reference #: [id number]  Please be advised we currently do not have a Pharmacist to review denials, therefore you will need to process appeals accordingly as needed. Thanks for your support at this time. Contact for appeals are as follows: Phone: [phone], Fax: [phone]    Last day to appeal is 03-03-2023.  Only the Requesting Provider may call the Medical Director who made this decision at [phone] to schedule a Peer-to-Peer Review.

## 2023-01-02 NOTE — Telephone Encounter (Signed)
Brooklyn can you let the patient know that tetracycline is not covered by insurance, in it's place we will use doxycyline.  Recommend the following regimen:  Protonix 40mg  BID Pepto Bismol 2 tabs (262 mg each) 4 times a day x 14 d  Metronidazole 250 mg 4 times a day x 14 d  doxycycline 100 mg 2 times a day x 14 d   Can you please order this if no signfiicant interactions noted. Once done with therapy, the patient should wait one month and perform a stool study for H pylori antigen to ensure negative (H pylori diatherix swab).  The patient should avoid alcohol while taking Flagyl. Thanks

## 2023-01-02 NOTE — Telephone Encounter (Signed)
Pharmacy Patient Advocate Encounter   Received notification from Pt Calls Messages that prior authorization for Tetracycline HCl 500MG  capsules is required/requested.   Insurance verification completed.   The patient is insured through [facility] [place] .   Per test claim: PA required; PA submitted to [facility] Medicaid via CoverMyMeds Key/confirmation #/[id number] Status is pending

## 2023-01-03 MED ORDER — PEPTO-BISMOL 262 MG PO TABS: 2.0000 | ORAL_TABLET | Freq: Four times a day (QID) | ORAL | 0 refills | Status: AC

## 2023-01-03 MED ORDER — DOXYCYCLINE HYCLATE 100 MG PO CAPS: 100.0000 mg | ORAL_CAPSULE | Freq: Two times a day (BID) | ORAL | 0 refills | Status: AC

## 2023-01-03 MED ORDER — PANTOPRAZOLE SODIUM 40 MG PO TBEC: 40.0000 mg | DELAYED_RELEASE_TABLET | Freq: Two times a day (BID) | ORAL | 0 refills | Status: DC

## 2023-01-03 MED ORDER — METRONIDAZOLE 250 MG PO TABS: 250.0000 mg | ORAL_TABLET | Freq: Four times a day (QID) | ORAL | 0 refills | Status: AC

## 2023-01-03 NOTE — Telephone Encounter (Signed)
 See 12/16/22 telephone encounter.

## 2023-01-03 NOTE — Telephone Encounter (Signed)
Called and spoke with patient regarding recommendations. Pt is aware that she will need to restart H. Pylori treatment with regimen below. Prescriptions have been sent to pharmacy on file. Pt knows to expect a call in about 6 weeks so that she can complete H pylori diatherix swab. Pt verbalized understanding and had no concerns at the end of the call.   6-week H pylori diatherix swab reminder in epic - due on/after 02/14/23

## 2023-01-03 NOTE — Telephone Encounter (Signed)
 [place] can you let the patient know that tetracycline is not covered by insurance, in it's place we will use doxycyline.  Recommend the following regimen:  Protonix 40mg  BID Pepto Bismol 2 tabs (262 mg each) 4 times a day x 14 d  Metronidazole 250 mg 4 times a day x 14 d  doxycycline 100 mg 2 times a day x 14 d   Can you please order this if no signfiicant interactions noted. Once done with therapy, the patient should wait one month and perform a stool study for H pylori antigen to ensure negative (H pylori diatherix swab).  The patient should avoid alcohol while taking Flagyl. Thanks

## 2023-01-03 NOTE — Addendum Note (Signed)
Addended by: Missy Sabins on: 01/03/2023 10:45 AM   Modules accepted: Orders

## 2023-01-05 DIAGNOSIS — Z419 Encounter for procedure for purposes other than remedying health state, unspecified: Secondary | ICD-10-CM | POA: Diagnosis not present

## 2023-01-14 ENCOUNTER — Encounter: Payer: Medicaid Other | Attending: Physician Assistant | Admitting: Physical Therapy

## 2023-01-14 ENCOUNTER — Encounter: Payer: Self-pay | Admitting: Physical Therapy

## 2023-01-14 DIAGNOSIS — M62838 Other muscle spasm: Secondary | ICD-10-CM | POA: Diagnosis not present

## 2023-01-14 DIAGNOSIS — M6281 Muscle weakness (generalized): Secondary | ICD-10-CM | POA: Diagnosis not present

## 2023-01-14 DIAGNOSIS — K5904 Chronic idiopathic constipation: Secondary | ICD-10-CM | POA: Insufficient documentation

## 2023-01-14 DIAGNOSIS — R1013 Epigastric pain: Secondary | ICD-10-CM | POA: Insufficient documentation

## 2023-01-14 DIAGNOSIS — K920 Hematemesis: Secondary | ICD-10-CM | POA: Diagnosis not present

## 2023-01-14 DIAGNOSIS — R102 Pelvic and perineal pain unspecified side: Secondary | ICD-10-CM

## 2023-01-14 DIAGNOSIS — R279 Unspecified lack of coordination: Secondary | ICD-10-CM | POA: Diagnosis not present

## 2023-01-14 DIAGNOSIS — N941 Unspecified dyspareunia: Secondary | ICD-10-CM | POA: Insufficient documentation

## 2023-01-14 NOTE — Therapy (Signed)
OUTPATIENT PHYSICAL THERAPY FEMALE PELVIC EVALUATION   Patient Name: Carol Welch MRN: [id number] DOB:[birth year], 47 y.o., female Today's Date: 01/14/2023  END OF SESSION:  PT End of Session - 01/14/23 1039     Visit Number 5    Date for PT Re-Evaluation 02/25/23    Authorization Type [employer]    Authorization Time Period 12/03/2022-02/01/2023    Authorization - Visit Number 5    Authorization - Number of Visits 10    PT Start Time 1036    PT Stop Time 1120    PT Time Calculation (min) 44 min    Activity Tolerance Patient tolerated treatment well    Behavior During Therapy WFL for tasks assessed/performed             Past Medical History:  Diagnosis Date   Anal fissure    Colitis    Depression    Edema, lower extremity    Fatty liver    Hypothyroidism    Low blood pressure    PONV (postoperative nausea and vomiting)    SVD (spontaneous vaginal delivery) 06/30/2015   Vitamin D deficiency    Past Surgical History:  Procedure Laterality Date   BREAST BIOPSY Right 07/09/2022   [place] RT BREAST BX W LOC DEV 1ST LESION IMG BX SPEC US GUIDE 07/09/2022 [facility]   BREAST CYST ASPIRATION Right 02/13/2018   CYSTOSCOPY N/A 12/30/2019   Procedure: CYSTOSCOPY;  Surgeon: Sherian Rein, MD;  Location: [facility];  Service: Gynecology;  Laterality: N/A;   ENDOVENOUS ABLATION SAPHENOUS VEIN W/ LASER Right 10/08/2018   endovenous laser ablation right saphenous vein by Waverly Ferrari MD   LAPAROSCOPIC VAGINAL HYSTERECTOMY WITH SALPINGECTOMY Bilateral 12/30/2019   Procedure: LAPAROSCOPIC ASSISTED VAGINAL HYSTERECTOMY WITH SALPINGECTOMY;  Surgeon: Sherian Rein, MD;  Location: [facility];  Service: Gynecology;  Laterality: Bilateral;   Patient Active Problem List   Diagnosis Date Noted   Pre-diabetes 12/18/2022   Other fatigue 12/18/2022   Worried well 10/09/2022   Obesity, Beginning BMI 30.54 10/09/2022   Genetic testing  10/02/2022   Monoallelic mutation of CDKN2A gene 10/02/2022   Vaginal itching 10/01/2022   H. 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   Neck pain 01/18/2020   S/P laparoscopic assisted vaginal hysterectomy (LAVH) 12/30/2019   External thrombosed hemorrhoids 09/11/2019   MDD (major depressive disorder) 08/24/2019   Orthostatic hypotension 08/24/2019   Hypothyroidism 07/06/2018   Family history of congenital anomaly 08/27/2016    PCP: Tiffany Kocher, DO  REFERRING PROVIDER: Benancio Deeds, MD   REFERRING DIAG:  R10.13 (ICD-10-CM) - Postprandial epigastric pain  K92.0 (ICD-10-CM) - Hematemesis with nausea  K59.04 (ICD-10-CM) - Chronic idiopathic constipation  N94.10 (ICD-10-CM) - Dyspareunia in female    THERAPY DIAG:  Other muscle spasm  Pelvic pain  Muscle weakness (generalized)  Unspecified lack of coordination  Rationale for Evaluation and Treatment: Rehabilitation  ONSET DATE: 2022  SUBJECTIVE:  SUBJECTIVE STATEMENT: When I sit on the floor my left side of the tailbone bothers me. At first feels like a poking then feels like it is to one side. I tried to start running last week I had pain in the lower abdomen and abdomen looked like I was pregnant. Patient gets up 2 times per night. Patient will reduce her fluid intake around 6 at night. I take [place] for bowel movements. I still have the bacterial infection in the stomach. I am taking a probiotic.    PAIN:  Are  you having pain? Yes NPRS scale: 5/10 Pain location:  lower abdomen  Pain type: bloating, heaviness, bloating Pain description: intermittent , happens 80% of the time  Aggravating factors: running, not having a bowel movement,  Relieving factors: lay down and stroke stomach upward  PRECAUTIONS: None  RED FLAGS: None   WEIGHT BEARING RESTRICTIONS: No  FALLS:  Has patient fallen in last 6 months? No  LIVING ENVIRONMENT: Lives with: lives with their family  OCCUPATION: walking, weights  PLOF: Independent  PATIENT GOALS: reduce discomfort, easier to have a bowel movement  PERTINENT HISTORY:  Anal fissure; Colitis; Hypothyroidism;   BOWEL MOVEMENT: Pain with bowel movement: Yes, intermittent 5/10 Type of bowel movement:Type (Bristol Stool Scale) type 4, Frequency daily, Strain Yes, and Splinting none Fully empty rectum: No Leakage: No Fiber supplement: Yes: Metamucil  URINATION: Pain with urination: No Fully empty bladder: Yes: but at times feels she has more urine left and sit back down to empty bladder further Stream: Strong Urgency: No Frequency: average Leakage:  none Pads: No  INTERCOURSE: Pain with intercourse: Initial Penetration and During Penetration, Patient feels a little spot hurts that is upward to the left Ability to have vaginal penetration:  Yes:   Climax: sometimes, struggle since hysterectectomy Marinoff Scale: 2/3  PREGNANCY: Vaginal deliveries 5, last delivery 03/2018 Tearing did have tearing with the first few and needed stitches, did not have stitches on fourth but had tearing   PROLAPSE: None   OBJECTIVE:   DIAGNOSTIC FINDINGS:  none  PATIENT SURVEYS:  PFIQ-7 133; CRAIQ-7: 71; POPIQ-7 62 12/31/22 PFIQ-7 181; CRAIQ-7: 86; POPIQ-7 95  COGNITION: Overall cognitive status: Within functional limits for tasks assessed      POSTURE: No Significant postural limitations  PELVIC ALIGNMENT: right ilium anteriorly  rotated  LUMBARAROM/PROM:  A/PROM A/PROM  eval 12/31/22  Flexion Decreased by 25% full  Extension Decreased by 50% full  Right lateral flexion Decreased by 25% Decreased by 25%  Left lateral flexion full full  Right rotation Decreased by 25% Decreased by 25%  Left rotation Decreased by 25% Decreased by 25%   (Blank rows = not tested)  LOWER EXTREMITY ROM:  Passive ROM Right eval Left eval  Hip external rotation 40 40   (Blank rows = not tested)  LOWER EXTREMITY MMT:  MMT Right eval Right  12/31/22  Hip flexion 5/5 5/5  Hip extension 4/5 4/5  Hip abduction 3/5 3/5  Hip adduction 4/5 3/5   PALPATION:   General  throughout the abdomen, decreased diaphragm mobility and lower rib cage mobility                External Perineal Exam along the posterior 1/3 of the external rectum; not able to do internal through the rectum due to pain and tightness, tenderness located on bil. Ischiocavernosus and bulbocavernosus  Internal Pelvic Floor tenderness located in bilateral obturator internist, levator ani, and above the bladder on the anterior wall  Patient confirms identification and approves PT to assess internal pelvic floor and treatment Yes  PELVIC MMT:   MMT eval  Vaginal 2/5  Internal Anal Sphincter   External Anal Sphincter   Puborectalis   Diastasis Recti   (Blank rows = not tested)        TONE: increased  PROLAPSE: Anterior wall weakness just above the introitus, posterior wall weakness just above the introitus  TODAY'S TREATMENT:  01/14/23 Manual: Soft tissue mobilization: Patient laying on her side while therapist performs manual work along the obturator internist, perineal body, levator ani, coccygeus to elongate the tissue and work through trigger points Myofascial release: Fascial release around the rectum externally to elongate the tissue Neuromuscular re-education: Down training: Therapist finger at the anus feeling for the  downward glide of the pelvic floor muscles as she performs diaphragmatic breathing and and breathing out as she is having a bowel movement  while laying on her side    12/31/22 Manual: Myofascial release: Fascial release along the urogenital diaphragm going slowly through the restrictions Release of the obturator internists with one hand above the pubic rami and other along the obturator foreman.  Internal pelvic floor techniques: No emotional/communication barriers or cognitive limitation. Patient is motivated to learn. Patient understands and agrees with treatment goals and plan. PT explains patient will be examined in standing, sitting, and lying down to see how their muscles and joints work. When they are ready, they will be asked to remove their underwear so PT can examine their perineum. The patient is also given the option of providing their own chaperone as one is not provided in our facility. The patient also has the right and is explained the right to defer or refuse any part of the evaluation or treatment including the internal exam. With the patient's consent, PT will use one gloved finger to gently assess the muscles of the pelvic floor, seeing how well it contracts and relaxes and if there is muscle symmetry. After, the patient will get dressed and PT and patient will discuss exam findings and plan of care. PT and patient discuss plan of care, schedule, attendance policy and HEP activities.  Manual work along the labia majora and ischiocavernosus going slowly and monitoring for pain and relaxation of the area Release along the bulbocavernosus externally    12/24/22 Manual: Soft tissue mobilization: To assess for dry needling  Manual work to the lumbar paraspinals, quadratus, along the obliques Myofascial release: Quadruped therapist pulling the posterior tissue forward then pull the abdominal forward to take the organs off the back wall Quadruped pulling up from the pubic bone and  patient going forward and back then diagonals Trigger Point Dry-Needling  Treatment instructions: Expect mild to moderate muscle soreness. S/S of pneumothorax if dry needled over a lung field, and to seek immediate medical attention should they occur. Patient verbalized understanding of these instructions and education.  Patient Consent Given: Yes Education handout provided: Yes Muscles treated: lumbar multifidi, Quadratus lumborum Electrical stimulation performed: No Parameters: N/A Treatment response/outcome: elongation of muscle and trigger point response Exercises: Stretches/mobility: Double knee to chest holding 30 sec Trunk rotation pulling the knee across the body Educated patient on abdominal massage.      PATIENT EDUCATION: 12/24/22 Education details: Access Code: [email or web address], information on dry needling Person educated: Patient Education method: Explanation, Demonstration, Tactile cues, Verbal cues, and Handouts Education  comprehension: verbalized understanding, returned demonstration, verbal cues required, tactile cues required, and needs further education    HOME EXERCISE PROGRAM: 12/17/22 Access Code: [email or web address] URL: [email or web address] Date: 12/17/2022 Prepared by: Eulis Foster  Exercises - Supine Piriformis Stretch with Leg Straight  - 1 x daily - 7 x weekly - 1 sets - 2 reps - 30 sec hold - Supine Hamstring Stretch  - 1 x daily - 7 x weekly - 1 sets - 2 reps - 30 sec hold - Happy Baby with Pelvic Floor Lengthening  - 1 x daily - 7 x weekly - 1 sets - 1 reps - 30 sec hold - Cat Cow  - 1 x daily - 7 x weekly - 1 sets - 10 reps - Child's Pose Stretch  - 1 x daily - 7 x weekly - 1 sets - 1 reps - 30 sec hold - Quadruped Full Range Thoracic Rotation with Reach  - 1 x daily - 7 x weekly - 2 reps - 5 sec hold - Prone Press Up  - 1 x daily - 7 x weekly - 1 sets - 10 reps  ASSESSMENT:  CLINICAL IMPRESSION: Patient is a 47 y.o. female who was seen today for  physical therapy  treatment for dyspareunia and constipation.  Patient Patient urinates 2 times at night. She has trigger points in the perineal body, levator ani and obturator internist externally. She was able to bulge her pelvic floor with diaphragmatic breathing. She had trouble keeping her pelvic floor bulged while breathing out to facilitate a bowel movement.  Patient presently has a bacterial infection in her stomach. Her next appointment is in October but she is getting in when we have cancels. She is not abel to fully empty her bladder yet. She has discomfort with her coccyx when sitting on the floor. Patient will benefit from skilled therapy to relax her pelvic floor muscles to improve defecation and vaginal penetration.   OBJECTIVE IMPAIRMENTS: decreased coordination, decreased strength, increased fascial restrictions, increased muscle spasms, and pain.   ACTIVITY LIMITATIONS: toileting  PARTICIPATION LIMITATIONS: interpersonal relationship  PERSONAL FACTORS: 1 comorbidity: Anal fissure; Colitis; Hypothyroidism;  are also affecting patient's functional outcome.   REHAB POTENTIAL: Good  CLINICAL DECISION MAKING: Stable/uncomplicated  EVALUATION COMPLEXITY: Low   GOALS: Goals reviewed with patient? Yes  SHORT TERM GOALS: Target date: 12/30/22  Patient is independent with initial HEP.  Baseline:Not educated yet Goal status: Met 12/24/22   LONG TERM GOALS: Target date: 02/25/23  Patient is independent with advanced HEP for stretches and core strength.  Baseline: not educated yet Goal status: INITIAL  2.  Patient is independent with abdominal massage to improve defecation.  Baseline: Not educated yet Goal status: met 12/31/22  3.  Patient is abel to defecate with pain level </= 1/10 and straining reduced >/= 75% due to improved lengthening of the muscles.  Baseline: pain level 5/10 and has to strain.  Goal status: INITIAL  4.  Patient is able to have penile penetration  vaginally with pain level </= 1-2/10.  Baseline: Pain level 5/10 Goal status: INITIAL  5.  Patient is able to fully empty her bladder due to the ability to relax her pelvic floor.  Baseline: Has to sit 2 times Goal status: INITIAL    PLAN:  PT FREQUENCY: 1x/week  PT DURATION: 12 weeks  PLANNED INTERVENTIONS: Therapeutic exercises, Therapeutic activity, Neuromuscular re-education, Patient/Family education, Joint mobilization, Aquatic Therapy, Electrical stimulation, Spinal mobilization, Cryotherapy, Moist heat, Ultrasound, Biofeedback, and Manual therapy  PLAN FOR NEXT SESSION:  manual work to pelvic floor, dry needling to pelvic floor ischiocavernosus, bulbocavernosus, perineal body and levator ani if she is up to it quadruped lift extremity     Eulis Foster, PT 01/14/23 11:33 AM

## 2023-01-16 DIAGNOSIS — R300 Dysuria: Secondary | ICD-10-CM | POA: Diagnosis not present

## 2023-01-16 DIAGNOSIS — B3731 Acute candidiasis of vulva and vagina: Secondary | ICD-10-CM | POA: Diagnosis not present

## 2023-01-16 DIAGNOSIS — L910 Hypertrophic scar: Secondary | ICD-10-CM | POA: Diagnosis not present

## 2023-01-17 ENCOUNTER — Encounter: Payer: Self-pay | Admitting: Obstetrics and Gynecology

## 2023-01-20 ENCOUNTER — Telehealth: Payer: Self-pay

## 2023-01-20 ENCOUNTER — Ambulatory Visit (INDEPENDENT_AMBULATORY_CARE_PROVIDER_SITE_OTHER): Payer: Medicaid Other | Admitting: Physician Assistant

## 2023-01-20 NOTE — Telephone Encounter (Signed)
-----   Message from Nurse Cool P sent at 12/09/2022  9:46 AM EDT ----- Regarding: H. pylori H. Pylori stool test due around 01/20/23 - order in epic

## 2023-01-20 NOTE — Telephone Encounter (Signed)
Retest will be completed at time of OV.

## 2023-01-24 ENCOUNTER — Other Ambulatory Visit: Payer: Medicaid Other

## 2023-01-24 ENCOUNTER — Ambulatory Visit
Admission: RE | Admit: 2023-01-24 | Discharge: 2023-01-24 | Disposition: A | Discharge status: Home / Self Care | Payer: Medicaid Other | Source: Ambulatory Visit | Attending: Obstetrics and Gynecology | Admitting: Obstetrics and Gynecology

## 2023-01-24 DIAGNOSIS — N6452 Nipple discharge: Secondary | ICD-10-CM

## 2023-01-24 MED ORDER — GADOPICLENOL 0.5 MMOL/ML IV SOLN
6.0000 mL | Freq: Once | INTRAVENOUS | Status: AC | PRN
  Administered 2023-01-24: 6 mL via INTRAVENOUS

## 2023-01-27 ENCOUNTER — Other Ambulatory Visit: Payer: Self-pay | Admitting: Obstetrics and Gynecology

## 2023-01-27 DIAGNOSIS — R928 Other abnormal and inconclusive findings on diagnostic imaging of breast: Secondary | ICD-10-CM

## 2023-01-28 ENCOUNTER — Encounter: Payer: Self-pay | Admitting: Physical Therapy

## 2023-01-28 ENCOUNTER — Encounter: Payer: Medicaid Other | Admitting: Physical Therapy

## 2023-01-28 DIAGNOSIS — K920 Hematemesis: Secondary | ICD-10-CM | POA: Diagnosis not present

## 2023-01-28 DIAGNOSIS — K5904 Chronic idiopathic constipation: Secondary | ICD-10-CM | POA: Diagnosis not present

## 2023-01-28 DIAGNOSIS — R102 Pelvic and perineal pain: Secondary | ICD-10-CM | POA: Diagnosis not present

## 2023-01-28 DIAGNOSIS — M62838 Other muscle spasm: Secondary | ICD-10-CM

## 2023-01-28 DIAGNOSIS — N941 Unspecified dyspareunia: Secondary | ICD-10-CM | POA: Diagnosis not present

## 2023-01-28 DIAGNOSIS — R279 Unspecified lack of coordination: Secondary | ICD-10-CM | POA: Diagnosis not present

## 2023-01-28 DIAGNOSIS — M6281 Muscle weakness (generalized): Secondary | ICD-10-CM | POA: Diagnosis not present

## 2023-01-28 DIAGNOSIS — R1013 Epigastric pain: Secondary | ICD-10-CM | POA: Diagnosis not present

## 2023-01-28 NOTE — Progress Notes (Unsigned)
HPI : seen by Quentin Mulling  47 y.o. female with a past medical history of prediabetes, vitamin D insufficiency, obesity and others listed below presents for evaluation of constipation, post prandial AB pain, nausea.    08/11/2020 colonoscopy with Dr. Adela Lank for rectal bleeding TI normal internal hemorrhoids small, no polyps recall colonoscopy 10 years. 05/30/2021 AB [place] for AB pain, negative galltones, hepatic steatosis.  11/05/2021 HIDA negative   She has had constipation for years, she was taking as needed laxatives but she is now taking senokot daily and colace.  She has mainly soft stool but never has full BM and feels something is there.  She has to eat small frequent meals or she has AB pain, with associated nausea. She has had vomiting with small volume bloody emesis this past month, states was moderate amount of BRB.  Denies dysphagia or painful swallowing.  She has excessive burping.  Has taken food diary, feels meats get more bloating and more time to digest or better with liquids.  She has lost some weight but has been trying.  No rashes.  She has some discomfort with intercourse.    She has 46 year old, 46 year old, 40 , 53, and 58 year old kids.    She denies blood thinner use.  She denies NSAID use.  She denies ETOH use.   She denies tobacco use.  She denies drug use.     She  reports that she has never smoked. She has never been exposed to tobacco smoke. She has never used smokeless tobacco. She reports current alcohol use. She reports that she does not use drugs.   ASSESSMENT AND PLAN:    Postprandial epigastric pain with nausea, 2 episodes of hematemesis. Uncertain if this is Mallory-Weiss tear, esophagitis, gastritis, peptic ulcer disease, H. Pylori Added on PPI once daily Will schedule EGD. I discussed risks of EGD with patient today, including risk of sedation, bleeding or perforation.  Patient provides understanding and gave verbal consent to proceed. If  this is negative we will consider cross-sectional imaging versus GES Had negative right upper quadrant ultrasound and HIDA   Chronic idiopathic constipation 2022 unremarkable colonoscopy recall 10 years Patient has had 5 kids, status post hysterectomy, some dyspareunia, no incontinence Add on Linzess 72 Will refer to pelvic floor physical therapy Suggest follow-up with GYN   Fatty liver Seen on recent right upper quadrant ultrasound --Continue to work on risk factor modification including diet exercise and control of risk factors including blood sugars. - monitor q 6 months.        EGD 09/10/22: - Esophagogastric landmarks were identified: the Z-line was found at 37 cm, the gastroesophageal junction was found at 37 cm and the upper extent of the gastric folds was found at 37 cm from the incisors. Findings: - There was a benign gastric inlet patch in the proximal esophagus. The exam of the esophagus was otherwise normal. - The entire examined stomach was normal. Biopsies were taken with a cold forceps for Helicobacter pylori testing. - The examined duodenum was normal.   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 NEGATIVE FOR DYSPLASIA OR MALIGNANCY.  Recommend the following treatment regimen for 14 days: Amoxicillin 1gm BID Clarithromycin 500mg  BID Flagyl 500mg  BID Continue protonix 40mg  BID      H. pylori stool swab returned from 12/04/2022 and it is positive.   Recall she had an EGD that was positive for  H. pylori on May 7. She was treated with the following regimen for that: Amoxicillin 1gm BID Clarithromycin 500mg  BID Flagyl 500mg  BID Continue protonix 40mg  BID     [place] can you relay the following:   Looks like this regimen did not eradicate the H pylori, she remains positive. Recommending the following regimen at this point to treat the H pylori.   Omeprazole protonix 40mg   BID  x 14 d  Pepto Bismol 2 tabs (262 mg each) 4 times a day x 14 d  Metronidazole 250 mg 4 times a day x 14 d  Tetracycline 500mg   4 times a day x 14 d    Can you please order this if no signfiicant interactions noted. Once done with therapy, the patient should wait one month and perform a stool study for H pylori antigen to ensure negative. The patient should avoid alcohol while taking Flagyl.    [place] can you let the patient know that tetracycline is not covered by insurance, in it's place we will use doxycyline.  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    CT scan abdomen / pelvis 07/25/20: IMPRESSION: No acute findings in the abdomen or pelvis. Moderate stool burden in the colon.   Past Medical History:  Diagnosis Date   Anal fissure    Colitis    Depression    Edema, lower extremity    Fatty liver    Hypothyroidism    Low blood pressure    PONV (postoperative nausea and vomiting)    SVD (spontaneous vaginal delivery) 06/30/2015   Vitamin D deficiency      Past Surgical History:  Procedure Laterality Date   BREAST BIOPSY Right 07/09/2022   [place] RT BREAST BX W LOC DEV 1ST LESION IMG BX SPEC US GUIDE 07/09/2022 [facility]   BREAST CYST ASPIRATION Right 02/13/2018   CYSTOSCOPY N/A 12/30/2019   Procedure: CYSTOSCOPY;  Surgeon: Sherian Rein, MD;  Location: [facility];  Service: Gynecology;  Laterality: N/A;   ENDOVENOUS ABLATION SAPHENOUS VEIN W/ LASER Right 10/08/2018   endovenous laser ablation right saphenous vein by Waverly Ferrari MD   LAPAROSCOPIC VAGINAL HYSTERECTOMY WITH SALPINGECTOMY Bilateral 12/30/2019   Procedure: LAPAROSCOPIC ASSISTED VAGINAL HYSTERECTOMY WITH SALPINGECTOMY;  Surgeon:  Sherian Rein, MD;  Location: [facility];  Service: Gynecology;  Laterality: Bilateral;   Family History  Problem Relation Age of Onset   High blood pressure Mother    High Cholesterol Mother    Obesity Mother    Heart disease Father    Breast cancer Maternal Aunt        dx 33s   Breast cancer Maternal Aunt        d. 30s   Esophageal cancer Paternal Aunt        d. 24   Stomach cancer Paternal Aunt        d.74   Breast cancer Maternal Grandmother        d. 55   Heart disease Paternal Grandfather    Breast cancer Cousin        four maternal female cousins w/ breast ca before age 17   Colon cancer Neg Hx  Rectal cancer Neg Hx    Social History   Tobacco Use   Smoking status: Never    Passive exposure: Never   Smokeless tobacco: Never  Vaping Use   Vaping status: Never Used  Substance Use Topics   Alcohol use: Yes    Comment: once a year   Drug use: Never   Current Outpatient Medications  Medication Sig Dispense Refill   acetaminophen (TYLENOL) 500 MG tablet Take 1,000 mg by mouth every 6 (six) hours as needed for mild pain, moderate pain, fever or headache.     cholecalciferol (VITAMIN D3) 25 MCG (1000 UNIT) tablet Take 1,000 Units by mouth daily.     Cyanocobalamin (B-12 PO) Take 1 tablet by mouth daily.     ergocalciferol (VITAMIN D2) 1.25 MG (50000 UT) capsule Take 1 capsule (50,000 Units total) by mouth once a week. 4 capsule 0   escitalopram (LEXAPRO) 20 MG tablet Take 1 tablet (20 mg total) by mouth daily. 90 tablet 2   famotidine (PEPCID) 20 MG tablet Take 1 tablet (20 mg total) by mouth 2 (two) times daily. 30 tablet 3   ibuprofen (ADVIL) 200 MG tablet Take 200 mg by mouth every 6 (six) hours as needed for mild pain.     Krill Oil (OMEGA-3) 500 MG CAPS Take 500 mg by mouth daily.     levothyroxine (SYNTHROID) 75 MCG tablet TAKE 1 TABLET(75 MCG) BY MOUTH DAILY 90 tablet 3   linaclotide (LINZESS) 72 MCG capsule Take 1 capsule (72 mcg total)  by mouth daily before breakfast. 90 capsule 3   metFORMIN (GLUCOPHAGE) 500 MG tablet Take 1 tablet (500 mg total) by mouth daily with breakfast. 30 tablet 2   Multiple Vitamins-Minerals (MULTIVITAMIN WITH MINERALS) tablet Take 1 tablet by mouth daily.     nystatin (MYCOSTATIN) 100000 UNIT/ML suspension Use as directed 5 mLs (500,000 Units total) in the mouth or throat 4 (four) times daily. 473 mL 0   ondansetron (ZOFRAN) 4 MG tablet Take 1 tablet (4 mg total) by mouth every 8 (eight) hours as needed for nausea or vomiting. 20 tablet 0   pantoprazole (PROTONIX) 40 MG tablet Take 1 tablet (40 mg total) by mouth daily. 30 tablet 3   pantoprazole (PROTONIX) 40 MG tablet Take 1 tablet (40 mg total) by mouth 2 (two) times daily for 14 days. 28 tablet 0   Psyllium (FIBER) 28.3 % POWD Take 1 Package by mouth 2 (two) times daily. 368 g 0   No current facility-administered medications for this visit.   No Known Allergies   Review of Systems: All systems reviewed and negative except where noted in HPI.    MR BREAST BILATERAL W WO CONTRAST INC CAD  Result Date: 01/24/2023 CLINICAL DATA:  47 year old female presenting for MRI to further evaluate symptoms of spontaneous clear right nipple discharge since mid July 2024. She had a diagnostic mammogram and ultrasound 12/04/2022 with normal results. EXAM: BILATERAL BREAST MRI WITH AND WITHOUT CONTRAST TECHNIQUE: Multiplanar, multisequence MR images of both breasts were obtained prior to and following the intravenous administration of 6 ml of Vueway. Three-dimensional MR images were rendered by post-processing of the original MR data on an independent workstation. The three-dimensional MR images were interpreted, and findings are reported in the following complete MRI report for this study. Three dimensional images were evaluated at the independent interpreting workstation using the DynaCAD thin client. COMPARISON:  None available. FINDINGS: Breast composition: c.  Heterogeneous fibroglandular tissue. Background parenchymal enhancement: Marked Right  breast: In the lower slightly inner quadrant of the right breast (series 6, image 85), there is a 0.7 cm oval mass in the mid to posterior depth. No other mass or abnormal enhancement. Left breast: In the lower outer quadrant of the left breast (series 6, image 74) there is an irregular enhancing mass measuring approximately 1.0 cm. No other mass or abnormal enhancement. Lymph nodes: No abnormal appearing lymph nodes. Ancillary findings: There is a 1.4 cm oval circumscribed T2 bright mass in the liver compatible with a benign hepatic cyst. IMPRESSION: 1. There is a 0.7 cm mass in the lower inner quadrant of the right breast. 2. There is a 1.0 cm mass in the lower outer quadrant of the left breast. RECOMMENDATION: MRI directed ultrasound is recommended for further evaluation of the bilateral breast masses. If they are found and not clearly benign, than ultrasound-guided biopsy is recommended. If they cannot be identified, then MRI guided biopsies would be recommended. BI-RADS CATEGORY  4: Suspicious. Electronically Signed   By: Frederico Hamman M.D.   On: 01/24/2023 11:04    Physical Exam: LMP 12/19/2019  Constitutional: Pleasant,well-developed, ***female in no acute distress. HEENT: Normocephalic and atraumatic. Conjunctivae are normal. No scleral icterus. Neck supple.  Cardiovascular: Normal rate, regular rhythm.  Pulmonary/chest: Effort normal and breath sounds normal. No wheezing, rales or rhonchi. Abdominal: Soft, nondistended, nontender. Bowel sounds active throughout. There are no masses palpable. No hepatomegaly. Extremities: no edema Lymphadenopathy: No cervical adenopathy noted. Neurological: Alert and oriented to person place and time. Skin: Skin is warm and dry. No rashes noted. Psychiatric: Normal mood and affect. Behavior is normal.   ASSESSMENT: 47 y.o. female here for assessment of the  following  No diagnosis found.  PLAN:   Tiffany Kocher, DO

## 2023-01-28 NOTE — Therapy (Signed)
OUTPATIENT PHYSICAL THERAPY FEMALE PELVIC EVALUATION   Patient Name: Carol Welch MRN: [id number] DOB:31-Jul-1975, 47 y.o., female Today's Date: 01/28/2023  END OF SESSION:  PT End of Session - 01/28/23 0832     Visit Number 6    Date for PT Re-Evaluation 02/25/23    Authorization Type [employer]    Authorization Time Period 12/03/2022-02/01/2023    Authorization - Visit Number 6    Authorization - Number of Visits 10    PT Start Time 0830    PT Stop Time 0915    PT Time Calculation (min) 45 min    Activity Tolerance Patient tolerated treatment well    Behavior During Therapy WFL for tasks assessed/performed             Past Medical History:  Diagnosis Date   Anal fissure    Colitis    Depression    Edema, lower extremity    Fatty liver    Hypothyroidism    Low blood pressure    PONV (postoperative nausea and vomiting)    SVD (spontaneous vaginal delivery) 06/30/2015   Vitamin D deficiency    Past Surgical History:  Procedure Laterality Date   BREAST BIOPSY Right 07/09/2022   [place] RT BREAST BX W LOC DEV 1ST LESION IMG BX SPEC US GUIDE 07/09/2022 [facility]   BREAST CYST ASPIRATION Right 02/13/2018   CYSTOSCOPY N/A 12/30/2019   Procedure: CYSTOSCOPY;  Surgeon: Sherian Rein, MD;  Location: [facility];  Service: Gynecology;  Laterality: N/A;   ENDOVENOUS ABLATION SAPHENOUS VEIN W/ LASER Right 10/08/2018   endovenous laser ablation right saphenous vein by Waverly Ferrari MD   LAPAROSCOPIC VAGINAL HYSTERECTOMY WITH SALPINGECTOMY Bilateral 12/30/2019   Procedure: LAPAROSCOPIC ASSISTED VAGINAL HYSTERECTOMY WITH SALPINGECTOMY;  Surgeon: Sherian Rein, MD;  Location: [facility];  Service: Gynecology;  Laterality: Bilateral;   Patient Active Problem List   Diagnosis Date Noted   Pre-diabetes 12/18/2022   Other fatigue 12/18/2022   Worried well 10/09/2022   Obesity, Beginning BMI 30.54 10/09/2022   Genetic testing  10/02/2022   Monoallelic mutation of CDKN2A gene 10/02/2022   Vaginal itching 10/01/2022   H. 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   Neck pain 01/18/2020   S/P laparoscopic assisted vaginal hysterectomy (LAVH) 12/30/2019   External thrombosed hemorrhoids 09/11/2019   MDD (major depressive disorder) 08/24/2019   Orthostatic hypotension 08/24/2019   Hypothyroidism 07/06/2018   Family history of congenital anomaly 08/27/2016    PCP: Tiffany Kocher, DO  REFERRING PROVIDER: Benancio Deeds, MD   REFERRING DIAG:  R10.13 (ICD-10-CM) - Postprandial epigastric pain  K92.0 (ICD-10-CM) - Hematemesis with nausea  K59.04 (ICD-10-CM) - Chronic idiopathic constipation  N94.10 (ICD-10-CM) - Dyspareunia in female    THERAPY DIAG:  Other muscle spasm - Plan: PT plan of care cert/re-cert  Pelvic pain - Plan: PT plan of care cert/re-cert  Rationale for Evaluation and Treatment: Rehabilitation  ONSET DATE: 2022  SUBJECTIVE:  SUBJECTIVE STATEMENT: I am still on antibiotic. I still have constipation and headaches. I see the GI doctor tomorrow and biopsy of my breast. I have not done any exercise due to being tired from the infection. Patient has had trouble holding down liquid so her stool is more nuggets and she has to strain more.      PAIN:  Are you having pain? Yes NPRS scale: 5/10 Pain location:  lower abdomen  Pain type: bloating, heaviness, bloating Pain description:  intermittent , happens 80% of the time  Aggravating factors: running, not having a bowel movement,  Relieving factors: lay down and stroke stomach upward  PRECAUTIONS: None  RED FLAGS: None   WEIGHT BEARING RESTRICTIONS: No  FALLS:  Has patient fallen in last 6 months? No  LIVING ENVIRONMENT: Lives with: lives with their family  OCCUPATION: walking, weights  PLOF: Independent  PATIENT GOALS: reduce discomfort, easier to have a bowel movement  PERTINENT HISTORY:  Anal fissure; Colitis; Hypothyroidism;   BOWEL MOVEMENT: Pain with bowel movement: Yes, intermittent 5/10 Type of bowel movement:Type (Bristol Stool Scale) type 4, Frequency daily, Strain Yes, and Splinting none Fully empty rectum: No Leakage: No Fiber supplement: Yes: Metamucil  URINATION: Pain with urination: No Fully empty bladder: Yes: but at times feels she has more urine left and sit back down to empty bladder further Stream: Strong Urgency: No Frequency: average Leakage:  none Pads: No  INTERCOURSE: Pain with intercourse: Initial Penetration and During Penetration, Patient feels a little spot hurts that is upward to the left Ability to have vaginal penetration:  Yes:   Climax: sometimes, struggle since hysterectectomy Marinoff Scale: 2/3  PREGNANCY: Vaginal deliveries 5, last delivery 03/2018 Tearing did have tearing with the first few and needed stitches, did not have stitches on fourth but had tearing   PROLAPSE: None   OBJECTIVE:   DIAGNOSTIC FINDINGS:  none  PATIENT SURVEYS:  PFIQ-7 133; CRAIQ-7: 71; POPIQ-7 62 12/31/22 PFIQ-7 181; CRAIQ-7: 86; POPIQ-7 95   COGNITION: Overall cognitive status: Within functional limits for tasks assessed      POSTURE: No Significant postural limitations  PELVIC ALIGNMENT: right ilium anteriorly rotated  LUMBARAROM/PROM:  A/PROM A/PROM  eval 12/31/22  Flexion Decreased by 25% full  Extension Decreased by 50% full  Right lateral flexion  Decreased by 25% Decreased by 25%  Left lateral flexion full full  Right rotation Decreased by 25% Decreased by 25%  Left rotation Decreased by 25% Decreased by 25%   (Blank rows = not tested)  LOWER EXTREMITY ROM:  Passive ROM Right eval Left eval  Hip external rotation 40 40   (Blank rows = not tested)  LOWER EXTREMITY MMT:  MMT Right eval Right  12/31/22  Hip flexion 5/5 5/5  Hip extension 4/5 4/5  Hip abduction 3/5 3/5  Hip adduction 4/5 3/5   PALPATION:   General  throughout the abdomen, decreased diaphragm mobility and lower rib cage mobility                External Perineal Exam along the posterior 1/3 of the external rectum; not able to do internal through the rectum due to pain and tightness, tenderness located on bil. Ischiocavernosus and bulbocavernosus                             Internal Pelvic Floor tenderness located in bilateral obturator internist, levator ani, and above the bladder on the anterior wall  Patient  confirms identification and approves PT to assess internal pelvic floor and treatment Yes  PELVIC MMT:   MMT eval  Vaginal 2/5  Internal Anal Sphincter   External Anal Sphincter   Puborectalis   Diastasis Recti   (Blank rows = not tested)        TONE: increased  PROLAPSE: Anterior wall weakness just above the introitus, posterior wall weakness just above the introitus  TODAY'S TREATMENT:  01/28/23 Manual: Soft tissue mobilization:  Manual work to bilateral diaphragm Circular massage to abdomen to improve peristalic motion Myofascial release: Fascial release along the upper abdomen going through the different layers Fascial release along the mesenteric root Fascial release along the lower abdomen going through the restrictions.  Quadruped therapist pulling upward pulling of abdomen as patient rocks back and forth and diagonals Exercises: Stretches/mobility: Cat cow 20 x  Thread the needle 4 x Pigeon pose holding 1 min bil.  Long  sit hip adductor stretch holding 1 min V stretch with leaning to one leg holding 30 sec bil.     01/14/23 Manual: Soft tissue mobilization: Patient laying on her side while therapist performs manual work along the obturator internist, perineal body, levator ani, coccygeus to elongate the tissue and work through trigger points Myofascial release: Fascial release around the rectum externally to elongate the tissue Neuromuscular re-education: Down training: Therapist finger at the anus feeling for the downward glide of the pelvic floor muscles as she performs diaphragmatic breathing and and breathing out as she is having a bowel movement  while laying on her side    12/31/22 Manual: Myofascial release: Fascial release along the urogenital diaphragm going slowly through the restrictions Release of the obturator internists with one hand above the pubic rami and other along the obturator foreman.  Internal pelvic floor techniques: No emotional/communication barriers or cognitive limitation. Patient is motivated to learn. Patient understands and agrees with treatment goals and plan. PT explains patient will be examined in standing, sitting, and lying down to see how their muscles and joints work. When they are ready, they will be asked to remove their underwear so PT can examine their perineum. The patient is also given the option of providing their own chaperone as one is not provided in our facility. The patient also has the right and is explained the right to defer or refuse any part of the evaluation or treatment including the internal exam. With the patient's consent, PT will use one gloved finger to gently assess the muscles of the pelvic floor, seeing how well it contracts and relaxes and if there is muscle symmetry. After, the patient will get dressed and PT and patient will discuss exam findings and plan of care. PT and patient discuss plan of care, schedule, attendance policy and HEP  activities.  Manual work along the labia majora and ischiocavernosus going slowly and monitoring for pain and relaxation of the area Release along the bulbocavernosus externally      PATIENT EDUCATION: 12/24/22 Education details: Access Code: [email or web address], information on dry needling Person educated: Patient Education method: Explanation, Demonstration, Tactile cues, Verbal cues, and Handouts Education comprehension: verbalized understanding, returned demonstration, verbal cues required, tactile cues required, and needs further education    HOME EXERCISE PROGRAM: 12/17/22 Access Code: [email or web address] URL: [email or web address] Date: 12/17/2022 Prepared by: Eulis Foster  Exercises - Supine Piriformis Stretch with Leg Straight  - 1 x daily - 7 x weekly - 1 sets - 2 reps - 30 sec hold - Supine Hamstring Stretch  -  1 x daily - 7 x weekly - 1 sets - 2 reps - 30 sec hold - Happy Baby with Pelvic Floor Lengthening  - 1 x daily - 7 x weekly - 1 sets - 1 reps - 30 sec hold - Cat Cow  - 1 x daily - 7 x weekly - 1 sets - 10 reps - Child's Pose Stretch  - 1 x daily - 7 x weekly - 1 sets - 1 reps - 30 sec hold - Quadruped Full Range Thoracic Rotation with Reach  - 1 x daily - 7 x weekly - 2 reps - 5 sec hold - Prone Press Up  - 1 x daily - 7 x weekly - 1 sets - 10 reps  ASSESSMENT:  CLINICAL IMPRESSION: Patient is a 47 y.o. female who was seen today for physical therapy  treatment for dyspareunia and constipation.  Patient Patient urinates 2 times at night instead of 6 times. She has trigger points in the perineal body, levator ani and obturator internist externally. She was able to bulge her pelvic floor with diaphragmatic breathing. She had trouble keeping her pelvic floor bulged while breathing out to facilitate a bowel movement.  Patient presently has a bacterial infection in her stomach and still taking antibiotic. She has not been drinking much liquid sue to not able to hold it down from  the infection so her stool are now nuggets and she has to strain.  Her next appointment is in October but she is getting in when we have cancels. She is not abel to fully empty her bladder yet. She has discomfort with her coccyx when sitting on the floor. Patient pain decreased from 6/10 to 4/10 after the manual work. Patient will benefit from skilled therapy to relax her pelvic floor muscles to improve defecation and vaginal penetration.   OBJECTIVE IMPAIRMENTS: decreased coordination, decreased strength, increased fascial restrictions, increased muscle spasms, and pain.   ACTIVITY LIMITATIONS: toileting  PARTICIPATION LIMITATIONS: interpersonal relationship  PERSONAL FACTORS: 1 comorbidity: Anal fissure; Colitis; Hypothyroidism;  are also affecting patient's functional outcome.   REHAB POTENTIAL: Good  CLINICAL DECISION MAKING: Stable/uncomplicated  EVALUATION COMPLEXITY: Low   GOALS: Goals reviewed with patient? Yes  SHORT TERM GOALS: Target date: 12/30/22  Patient is independent with initial HEP.  Baseline:Not educated yet Goal status: Met 12/24/22   LONG TERM GOALS: Target date: 05/20/23  Patient is independent with advanced HEP for stretches and core strength.  Baseline: not educated yet Goal status: ongoing 01/28/23  2.  Patient is independent with abdominal massage to improve defecation.  Baseline: Not educated yet Goal status: met 12/31/22  3.  Patient is abel to defecate with pain level </= 1/10 and straining reduced >/= 75% due to improved lengthening of the muscles.  Baseline: pain level 6/10 and has to strain.  Goal status: ongoing 01/28/23  4.  Patient is able to have penile penetration vaginally with pain level </= 1-2/10.  Baseline: Pain level 5/10 Goal status: ongoing 01/28/23  5.  Patient is able to fully empty her bladder due to the ability to relax her pelvic floor.  Baseline: Has to sit 2 times Goal status: ongoing 01/28/23    PLAN:  PT FREQUENCY:  1x/week  PT DURATION: 12 weeks  PLANNED INTERVENTIONS: Therapeutic exercises, Therapeutic activity, Neuromuscular re-education, Patient/Family education, Joint mobilization, Aquatic Therapy, Electrical stimulation, Spinal mobilization, Cryotherapy, Moist heat, Ultrasound, Biofeedback, and Manual therapy  PLAN FOR NEXT SESSION:  manual work to pelvic floor, dry needling to  pelvic floor ischiocavernosus, bulbocavernosus, perineal body and levator ani if she is up to it quadruped lift extremity , se how appt. Micah Flesher with MD.     Eulis Foster, PT 01/28/23 9:38 AM

## 2023-01-29 ENCOUNTER — Ambulatory Visit
Admission: RE | Admit: 2023-01-29 | Discharge: 2023-01-29 | Disposition: A | Discharge status: Home / Self Care | Payer: Medicaid Other | Source: Ambulatory Visit | Attending: Obstetrics and Gynecology | Admitting: Obstetrics and Gynecology

## 2023-01-29 ENCOUNTER — Ambulatory Visit (INDEPENDENT_AMBULATORY_CARE_PROVIDER_SITE_OTHER): Payer: Medicaid Other | Admitting: Gastroenterology

## 2023-01-29 ENCOUNTER — Encounter: Payer: Self-pay | Admitting: Gastroenterology

## 2023-01-29 VITALS — BP 118/68 | HR 58 | Ht 62.0 in | Wt 149.0 lb

## 2023-01-29 DIAGNOSIS — R928 Other abnormal and inconclusive findings on diagnostic imaging of breast: Secondary | ICD-10-CM

## 2023-01-29 DIAGNOSIS — A048 Other specified bacterial intestinal infections: Secondary | ICD-10-CM | POA: Diagnosis not present

## 2023-01-29 DIAGNOSIS — N6323 Unspecified lump in the left breast, lower outer quadrant: Secondary | ICD-10-CM | POA: Diagnosis not present

## 2023-01-29 DIAGNOSIS — K5904 Chronic idiopathic constipation: Secondary | ICD-10-CM

## 2023-01-29 DIAGNOSIS — Z1501 Genetic susceptibility to malignant neoplasm of breast: Secondary | ICD-10-CM | POA: Diagnosis not present

## 2023-01-29 DIAGNOSIS — N6452 Nipple discharge: Secondary | ICD-10-CM | POA: Diagnosis not present

## 2023-01-29 DIAGNOSIS — R110 Nausea: Secondary | ICD-10-CM

## 2023-01-29 DIAGNOSIS — Z1509 Genetic susceptibility to other malignant neoplasm: Secondary | ICD-10-CM

## 2023-01-29 MED ORDER — LINACLOTIDE 145 MCG PO CAPS: 145.0000 ug | ORAL_CAPSULE | Freq: Every day | ORAL | Status: DC

## 2023-01-29 MED ORDER — ONDANSETRON 4 MG PO TBDP: 4.0000 mg | ORAL_TABLET | Freq: Three times a day (TID) | ORAL | 1 refills | Status: DC | PRN

## 2023-01-29 NOTE — Patient Instructions (Signed)
You have been scheduled for an MRI/MRCP at [facility], 1st floor, Radiology. Your appointment is scheduled on Friday, 02-07-24 at 4:00am. Please arrive 15 minutes prior to your appointment time for registration purposes. Please make certain not to have anything to eat or drink 4 hours prior to your test. In addition, if you have any metal in your body, have a pacemaker or defibrillator, please be sure to let your ordering physician know. This test typically takes 45 minutes to 1 hour to complete. Should you need to reschedule, please call [phone].   Please go to the lab in the basement of our building to have stool lab work done for H pylori in 1 month, around 02-28-23. Hit "B" for basement when you get on the elevator.  When the doors open the lab is on your left.  We will call you with the results. Thank you.  We have sent the following medications to your pharmacy for you to pick up at your convenience: Zofran 4 mg ODT: orally dissolve 1 tablet every 8 hours as needed  We have given you samples of the following medication to take: Linzess 145 mcg: Take once daily 30 minutes before a meal  We are referring you to [facility].  They will contact you directly to schedule an appointment.  It may take a week or more before you hear from them.  Please feel free to contact us if you have not heard from them within 2 weeks and we will follow up on the referral.   Thank you for entrusting me with your care and for choosing [facility], Dr. Ileene Patrick    If your blood pressure at your visit was 140/90 or greater, please contact your primary care physician to follow up on this. ______________________________________________________  If you are age 22 or older, your body mass index should be between 23-30. Your Body mass index is 27.25 kg/m. If this is out of the aforementioned range listed, please consider follow up with your Primary Care Provider.  If you are age 64  or younger, your body mass index should be between 19-25. Your Body mass index is 27.25 kg/m. If this is out of the aformentioned range listed, please consider follow up with your Primary Care Provider.  ________________________________________________________  The [facility] providers would like to encourage you to use [facility] to communicate with providers for non-urgent requests or questions.  Due to long hold times on the telephone, sending your provider a message by [facility] may be a faster and more efficient way to get a response.  Please allow 48 business hours for a response.  Please remember that this is for non-urgent requests.  _______________________________________________________  Due to recent changes in healthcare laws, you may see the results of your imaging and laboratory studies on MyChart before your provider has had a chance to review them.  We understand that in some cases there may be results that are confusing or concerning to you. Not all laboratory results come back in the same time frame and the provider may be waiting for multiple results in order to interpret others.  Please give [place] 48 hours in order for your provider to thoroughly review all the results before contacting the office for clarification of your results.

## 2023-01-30 ENCOUNTER — Other Ambulatory Visit: Payer: Self-pay | Admitting: Obstetrics and Gynecology

## 2023-01-30 ENCOUNTER — Ambulatory Visit (INDEPENDENT_AMBULATORY_CARE_PROVIDER_SITE_OTHER): Payer: Medicaid Other | Admitting: Family Medicine

## 2023-01-30 ENCOUNTER — Encounter: Payer: Self-pay | Admitting: Obstetrics and Gynecology

## 2023-01-30 VITALS — BP 89/58 | HR 57 | Temp 98.2°F | Ht 62.0 in | Wt 145.0 lb

## 2023-01-30 DIAGNOSIS — E7800 Pure hypercholesterolemia, unspecified: Secondary | ICD-10-CM

## 2023-01-30 DIAGNOSIS — Z6826 Body mass index (BMI) 26.0-26.9, adult: Secondary | ICD-10-CM | POA: Diagnosis not present

## 2023-01-30 DIAGNOSIS — R7303 Prediabetes: Secondary | ICD-10-CM

## 2023-01-30 DIAGNOSIS — E669 Obesity, unspecified: Secondary | ICD-10-CM

## 2023-01-30 DIAGNOSIS — R928 Other abnormal and inconclusive findings on diagnostic imaging of breast: Secondary | ICD-10-CM

## 2023-01-30 DIAGNOSIS — E559 Vitamin D deficiency, unspecified: Secondary | ICD-10-CM

## 2023-01-30 MED ORDER — ERGOCALCIFEROL 1.25 MG (50000 UT) PO CAPS: 50000.0000 [IU] | ORAL_CAPSULE | ORAL | 0 refills | Status: DC

## 2023-01-30 NOTE — Progress Notes (Signed)
.smr  Office: [phone]  /  Fax: [phone]  WEIGHT SUMMARY AND BIOMETRICS  Anthropometric Measurements Height: 5\' 2"  (1.575 m) Weight: 145 lb (65.8 kg) BMI (Calculated): 26.51 Weight at Last Visit: 142 lb Weight Lost Since Last Visit: 4 lb Weight Gained Since Last Visit: 0 Starting Weight: 167 lb Total Weight Loss (lbs): 27 lb (12.2 kg)   Body Composition  Body Fat %: 34.1 % Fat Mass (lbs): 49.6 lbs Muscle Mass (lbs): 91 lbs Total Body Water (lbs): 65.4 lbs Visceral Fat Rating : 6   Other Clinical Data Fasting: no Labs: no Today's Visit #: 20 Starting Date: 08/08/21    Chief Complaint: OBESITY   History of Present Illness   The patient, with a history of obesity, vitamin D deficiency, and prediabetes, presents for a follow-up visit. She reports a weight loss of four pounds over the past five weeks, attributing this to adherence to a category one eating plan approximately 75% of the time. She engages in physical activity, including walking and gardening, for about 30 minutes three times per week.  The patient is currently on prescription vitamin D (50,000 IU per week) and over-the-counter vitamin D (1000 IU per day). She also takes metformin for prediabetes. She reports increased hunger, particularly in the evening, which she attributes to her medication regimen.  The patient recently experienced a cold after being rained on while gardening. She tested negative for COVID-19. She also reports feeling unwell due to medication for H. pylori infection, with symptoms including fatigue, nausea, and headaches.  The patient is at a higher risk for certain cancers, including pancreatic and breast cancer, and is scheduled for an MRI biopsy. She expresses concern about her weight, particularly in light of her recent health issues.  The patient's family is supportive of her efforts to eat healthily, although they note some resistance from her seven-year-old child. Her  35 year old daughter has also lost weight recently and is seeing a nutritionist.  The patient is on metformin, which she takes once daily in the morning. She expresses willingness to increase the dose or add a second pill to manage her hunger issues. She also requests a refill of her vitamin D prescription.          PHYSICAL EXAM:  Blood pressure (!) 89/58, pulse (!) 57, temperature 98.2 F (36.8 C), height 5\' 2"  (1.575 m), weight 145 lb (65.8 kg), last menstrual period 12/19/2019, SpO2 100%. Body mass index is 26.52 kg/m.  DIAGNOSTIC DATA REVIEWED:  BMET    Component Value Date/Time   NA 138 12/18/2022 1427   K 4.5 12/18/2022 1427   CL 102 12/18/2022 1427   CO2 24 12/18/2022 1427   GLUCOSE 83 12/18/2022 1427   GLUCOSE 103 (H) 03/13/2021 1117   BUN 14 12/18/2022 1427   CREATININE 0.72 12/18/2022 1427   CALCIUM 9.3 12/18/2022 1427   GFRNONAA >60 03/13/2021 1117   GFRAA >60 01/02/2020 1911   Lab Results  Component Value Date   HGBA1C 5.8 (H) 12/18/2022   HGBA1C 5.6 08/08/2021   Lab Results  Component Value Date   INSULIN 2.7 12/18/2022   INSULIN 5.7 08/08/2021   Lab Results  Component Value Date   TSH 3.440 12/18/2022   CBC    Component Value Date/Time   WBC 6.2 12/18/2022 1427   WBC 7.5 03/13/2021 1117   RBC 4.42 12/18/2022 1427   RBC 4.53 03/13/2021 1117   HGB 13.1 12/18/2022 1427   HCT 39.2 12/18/2022 1427   PLT 280  12/18/2022 1427   MCV 89 12/18/2022 1427   MCH 29.6 12/18/2022 1427   MCH 29.4 03/13/2021 1117   MCHC 33.4 12/18/2022 1427   MCHC 33.1 03/13/2021 1117   RDW 13.0 12/18/2022 1427   Iron Studies No results found for: "IRON", "TIBC", "FERRITIN", "IRONPCTSAT" Lipid Panel     Component Value Date/Time   CHOL 251 (H) 12/18/2022 1427   TRIG 89 12/18/2022 1427   HDL 70 12/18/2022 1427   LDLCALC 166 (H) 12/18/2022 1427   Hepatic Function Panel     Component Value Date/Time   PROT 7.3 12/18/2022 1427   ALBUMIN 4.3 12/18/2022 1427   AST  13 12/18/2022 1427   ALT 7 12/18/2022 1427   ALKPHOS 73 12/18/2022 1427   BILITOT 0.3 12/18/2022 1427   BILIDIR <0.10 05/29/2021 1035   IBILI NOT CALCULATED 03/13/2021 1415      Component Value Date/Time   TSH 3.440 12/18/2022 1427   Nutritional Lab Results  Component Value Date   VD25OH 41.4 12/18/2022   VD25OH 49.7 07/17/2022   VD25OH 48.0 02/11/2022     Assessment and Plan    Obesity Slight weight gain of 1 pound since last visit. Patient is following a category one eating plan 75% of the time and engaging in physical activity such as walking and gardening. Discussed the importance of adequate nutrition for healing and the benefits of antioxidant-rich foods. Encouraged continued focus on protein intake and strengthening exercises. -Continue current diet and exercise regimen. -Consider increasing physical activity and strengthening exercises.  Vitamin D Deficiency Patient is on prescription vitamin D (50,000 IU per week) and over-the-counter vitamin D (1000 IU per day). Labs show improving levels, but not yet at desired range. -Refill prescription for vitamin D. -Continue current vitamin D regimen.  Prediabetes Patient is on metformin, currently taking one pill a day in the morning. Hemoglobin A1c is stable in the prediabetes range. Discussed the potential benefits of increasing metformin dosage to twice a day to cover the entire day. -Increase metformin to twice a day. -Check Hemoglobin A1c at next visit.  Hypercholesterolemia LDL cholesterol is elevated, suggesting a possible genetic component. Discussed potential future need for medication to reduce liver cholesterol production. -Monitor LDL cholesterol levels. -Consider cholesterol-lowering medication in the future if levels do not improve.  General Health Maintenance -Schedule follow-up visit in 4 weeks. -Continue to monitor weight, vitamin D levels, blood sugar control, and cholesterol levels.         I have  personally spent 40 minutes total time today in preparation, patient care, and documentation for this visit, including the following: review of clinical lab tests; review of medical tests/procedures/services.    She was informed of the importance of frequent follow up visits to maximize her success with intensive lifestyle modifications for her multiple health conditions.    Quillian Quince, MD

## 2023-01-31 ENCOUNTER — Ambulatory Visit
Admission: RE | Admit: 2023-01-31 | Discharge: 2023-01-31 | Disposition: A | Discharge status: Home / Self Care | Payer: Medicaid Other | Source: Ambulatory Visit | Attending: Obstetrics and Gynecology | Admitting: Obstetrics and Gynecology

## 2023-01-31 DIAGNOSIS — N6323 Unspecified lump in the left breast, lower outer quadrant: Secondary | ICD-10-CM | POA: Diagnosis not present

## 2023-01-31 DIAGNOSIS — R928 Other abnormal and inconclusive findings on diagnostic imaging of breast: Secondary | ICD-10-CM

## 2023-01-31 DIAGNOSIS — D242 Benign neoplasm of left breast: Secondary | ICD-10-CM | POA: Diagnosis not present

## 2023-01-31 MED ORDER — GADOPICLENOL 0.5 MMOL/ML IV SOLN
6.0000 mL | Freq: Once | INTRAVENOUS | Status: AC | PRN
  Administered 2023-01-31: 6 mL via INTRAVENOUS

## 2023-02-03 LAB — SURGICAL PATHOLOGY

## 2023-02-04 DIAGNOSIS — Z419 Encounter for procedure for purposes other than remedying health state, unspecified: Secondary | ICD-10-CM | POA: Diagnosis not present

## 2023-02-05 ENCOUNTER — Telehealth: Payer: Self-pay | Admitting: Gastroenterology

## 2023-02-05 DIAGNOSIS — F331 Major depressive disorder, recurrent, moderate: Secondary | ICD-10-CM | POA: Diagnosis not present

## 2023-02-05 NOTE — Telephone Encounter (Signed)
MRI  Clinicals have been faxed and the case is under MD review. I was told by a representative that the provider or any licensed clinician can call to perform a peer to peer review to expedite the process for this case.  Peer to peer: [phone] Opt 2 Opt 1 Enter tracking number [id number] Opt 4 Opt 1 Ask the rep you want to start a peer to peer

## 2023-02-05 NOTE — Telephone Encounter (Signed)
Okay thanks. Did they say how long the review will take? This is not an urgent test, okay to allow their review, I think it will be approved. We can appeal if not. Thanks

## 2023-02-06 NOTE — Telephone Encounter (Signed)
Called and spoke to patient.  She agrees procedure should be pushed out a couple of weeks to allow for peer to peer review to ensure it is covered.  Called [facility] scheduling and rescheduled for October 22 at 9am, arrive at 8:30am, NPO 4 hours . MyChart Message to patient with new appointment info

## 2023-02-06 NOTE — Telephone Encounter (Signed)
They stated this could take 2-3 business days to review but the patient is scheduled for tomorrow 02/07/23.

## 2023-02-06 NOTE — Telephone Encounter (Signed)
Inbound call from patient, calling in regards  to MRI, states she was contacted by the Imaging location, letting her know that they had not received the approval for the test. Is wanting to speak with a nurse in regards to appointment.

## 2023-02-07 ENCOUNTER — Ambulatory Visit (HOSPITAL_COMMUNITY): Payer: Medicaid Other

## 2023-02-12 ENCOUNTER — Other Ambulatory Visit: Payer: Self-pay

## 2023-02-12 MED ORDER — LINACLOTIDE 72 MCG PO CAPS: 72.0000 ug | ORAL_CAPSULE | Freq: Every day | ORAL | 1 refills | Status: DC

## 2023-02-12 MED ORDER — LINACLOTIDE 145 MCG PO CAPS: 145.0000 ug | ORAL_CAPSULE | Freq: Every day | ORAL | 1 refills | Status: DC

## 2023-02-14 ENCOUNTER — Telehealth: Payer: Self-pay | Admitting: Genetic Counselor

## 2023-02-14 ENCOUNTER — Encounter: Payer: Self-pay | Admitting: *Deleted

## 2023-02-14 ENCOUNTER — Telehealth: Payer: Self-pay | Admitting: *Deleted

## 2023-02-14 ENCOUNTER — Other Ambulatory Visit: Payer: Self-pay | Admitting: *Deleted

## 2023-02-14 DIAGNOSIS — A048 Other specified bacterial intestinal infections: Secondary | ICD-10-CM

## 2023-02-14 NOTE — Telephone Encounter (Signed)
Called patient to inform of the H. Pylori stool re-testing involving the [employer] stool test. Patient will need to pick up from the office on the 2nd floor. [employer] order has been placed. Patient has been off PPI medication as noted per chart. Left VM to inform patient to call office at [phone]. No DPR noted In chart. Patient should obtain the test by 02/26/23 from the office, and return to the lab by the week of 03/03/23.

## 2023-02-14 NOTE — Telephone Encounter (Signed)
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 Adela Lank

## 2023-02-14 NOTE — Telephone Encounter (Signed)
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  Returned call and LVM with contact information.

## 2023-02-14 NOTE — Telephone Encounter (Signed)
Wonderful news. thanks

## 2023-02-14 NOTE — Telephone Encounter (Signed)
Was able to contact patient and notify to stay off of her PPI's until after the [employer] test. Patient states she was informed by the doctor she could be retested after the 20th of October. Informed the patient she can come in to pick up the stool test by October 23rd or the week of the 28th. Patient agreed.

## 2023-02-25 ENCOUNTER — Other Ambulatory Visit: Payer: Self-pay | Admitting: Gastroenterology

## 2023-02-25 ENCOUNTER — Ambulatory Visit (HOSPITAL_COMMUNITY)
Admission: RE | Admit: 2023-02-25 | Discharge: 2023-02-25 | Disposition: A | Discharge status: Home / Self Care | Payer: Medicaid Other | Source: Ambulatory Visit | Attending: Gastroenterology | Admitting: Gastroenterology

## 2023-02-25 DIAGNOSIS — Z1501 Genetic susceptibility to malignant neoplasm of breast: Secondary | ICD-10-CM | POA: Diagnosis not present

## 2023-02-25 DIAGNOSIS — Z1289 Encounter for screening for malignant neoplasm of other sites: Secondary | ICD-10-CM | POA: Diagnosis not present

## 2023-02-25 DIAGNOSIS — Z1509 Genetic susceptibility to other malignant neoplasm: Secondary | ICD-10-CM | POA: Diagnosis not present

## 2023-02-25 DIAGNOSIS — Z8481 Family history of carrier of genetic disease: Secondary | ICD-10-CM | POA: Diagnosis not present

## 2023-02-25 DIAGNOSIS — C269 Malignant neoplasm of ill-defined sites within the digestive system: Secondary | ICD-10-CM | POA: Diagnosis not present

## 2023-02-25 MED ORDER — GADOBUTROL 1 MMOL/ML IV SOLN
6.0000 mL | Freq: Once | INTRAVENOUS | Status: AC | PRN
  Administered 2023-02-25: 6 mL via INTRAVENOUS

## 2023-02-26 DIAGNOSIS — F331 Major depressive disorder, recurrent, moderate: Secondary | ICD-10-CM | POA: Diagnosis not present

## 2023-02-27 ENCOUNTER — Telehealth: Payer: Self-pay

## 2023-02-27 ENCOUNTER — Other Ambulatory Visit: Payer: Self-pay

## 2023-02-27 ENCOUNTER — Encounter (HOSPITAL_COMMUNITY): Payer: Self-pay | Admitting: Emergency Medicine

## 2023-02-27 ENCOUNTER — Emergency Department (HOSPITAL_COMMUNITY)
Admission: EM | Admit: 2023-02-27 | Discharge: 2023-02-27 | Disposition: A | Discharge status: Home / Self Care | Payer: Medicaid Other | Attending: Emergency Medicine | Admitting: Emergency Medicine

## 2023-02-27 ENCOUNTER — Emergency Department (HOSPITAL_COMMUNITY): Payer: Medicaid Other

## 2023-02-27 ENCOUNTER — Ambulatory Visit (INDEPENDENT_AMBULATORY_CARE_PROVIDER_SITE_OTHER): Payer: Medicaid Other | Admitting: Family Medicine

## 2023-02-27 DIAGNOSIS — R079 Chest pain, unspecified: Secondary | ICD-10-CM | POA: Diagnosis not present

## 2023-02-27 DIAGNOSIS — R0789 Other chest pain: Secondary | ICD-10-CM | POA: Diagnosis not present

## 2023-02-27 DIAGNOSIS — R110 Nausea: Secondary | ICD-10-CM | POA: Diagnosis not present

## 2023-02-27 DIAGNOSIS — E039 Hypothyroidism, unspecified: Secondary | ICD-10-CM | POA: Insufficient documentation

## 2023-02-27 DIAGNOSIS — M549 Dorsalgia, unspecified: Secondary | ICD-10-CM | POA: Diagnosis not present

## 2023-02-27 LAB — BASIC METABOLIC PANEL
Anion gap: 6 (ref 5–15)
BUN: 17 mg/dL (ref 6–20)
CO2: 25 mmol/L (ref 22–32)
Calcium: 8.6 mg/dL — ABNORMAL LOW (ref 8.9–10.3)
Chloride: 107 mmol/L (ref 98–111)
Creatinine, Ser: 0.61 mg/dL (ref 0.44–1.00)
GFR, Estimated: >60 mL/min (ref >60)
Glucose, Bld: 94 mg/dL (ref 70–99)
Potassium: 4 mmol/L (ref 3.5–5.1)
Sodium: 138 mmol/L (ref 135–145)

## 2023-02-27 LAB — CBC
HCT: 39 % (ref 36.0–46.0)
Hemoglobin: 12.9 g/dL (ref 12.0–15.0)
MCH: 30.2 pg (ref 26.0–34.0)
MCHC: 33.1 g/dL (ref 30.0–36.0)
MCV: 91.3 fL (ref 80.0–100.0)
Platelets: 230 10*3/uL (ref 150–400)
RBC: 4.27 MIL/uL (ref 3.87–5.11)
RDW: 13.8 % (ref 11.5–15.5)
WBC: 4.9 10*3/uL (ref 4.0–10.5)
nRBC: 0 % (ref 0.0–0.2)

## 2023-02-27 LAB — TROPONIN I (HIGH SENSITIVITY)
Troponin I (High Sensitivity): <2 ng/L (ref <18)
Troponin I (High Sensitivity): <2 ng/L (ref <18)

## 2023-02-27 MED ORDER — PANTOPRAZOLE SODIUM 40 MG PO TBEC: 40.0000 mg | DELAYED_RELEASE_TABLET | Freq: Two times a day (BID) | ORAL | 0 refills | Status: DC

## 2023-02-27 MED ORDER — NAPROXEN 375 MG PO TABS: 375.0000 mg | ORAL_TABLET | Freq: Two times a day (BID) | ORAL | 0 refills | Status: DC

## 2023-02-27 MED ORDER — ASPIRIN 81 MG PO CHEW
324.0000 mg | CHEWABLE_TABLET | Freq: Once | ORAL | Status: AC
  Administered 2023-02-27: 324 mg via ORAL
  Filled 2023-02-27: qty 4

## 2023-02-27 NOTE — Telephone Encounter (Signed)
Received notification from scheduling team that patient is calling to schedule appointment for chest pain and not feeling well.   Returned call to patient.   02/26/23: 1800- onset back pain on right side wrapped around to chest. Took ibuprofen with little relief.   02/27/23: reports severe chest pain starting at 0100. Chest pain primarily on the left side and radiates into left arm. She describes pain as crushing and pressure on chest. Denies nausea, vomiting or shortness of breath.   Due to current symptoms, advised ED evaluation as soon as possible. Offered to call ambulance, patient states her daughter is with her and will drive her to the hospital now.   Veronda Prude, RN

## 2023-02-27 NOTE — ED Triage Notes (Signed)
Patient arrives ambulatory by POV c/o left sided chest pain onset of 1 am this morning. States pain radiated into left shoulder and arm. Took tylenol around 7 am that relief pain slightly.

## 2023-02-27 NOTE — Discharge Instructions (Signed)
Continue your antacid medications.  Take the anti-inflammatory medications to help with pain and discomfort.  Follow-up with your doctor to be rechecked.  Return for worsening symptoms, fevers, shortness of breath

## 2023-02-27 NOTE — ED Provider Notes (Signed)
[place] EMERGENCY DEPARTMENT AT [facility] Provider Note   CSN: [id number] Arrival date & time: 02/27/23  1136     History  Chief Complaint  Patient presents with   Chest Pain    Carol Welch is a 47 y.o. female.   Chest Pain Patient has a history of hypothyroidism, depression, H. pylori infection, prediabetes Pt started having pain in her chest yesterday evening.  It was sharp and stabbing on the right side.  Around 1 am she woke up with the pain, pressure more on the left side.  The pain radiates to her arm and felt heavy.  SHe had some nausea.  SHe called her doctor and was told to come to the ED.  NO shortness of breath.  SOme nausea.  Her feet did feel swollen yesterday.    Home Medications Prior to Admission medications   Medication Sig Start Date End Date Taking? Authorizing Provider  naproxen (NAPROSYN) 375 MG tablet Take 1 tablet (375 mg total) by mouth 2 (two) times daily. 02/27/23  Yes Linwood Dibbles, MD  acetaminophen (TYLENOL) 500 MG tablet Take 1,000 mg by mouth every 6 (six) hours as needed for mild pain, moderate pain, fever or headache.    [provider]  cholecalciferol (VITAMIN D3) 25 MCG (1000 UNIT) tablet Take 1,000 Units by mouth daily.    [provider]  Cyanocobalamin (B-12 PO) Take 1 tablet by mouth daily.    [provider]  ergocalciferol (VITAMIN D2) 1.25 MG (50000 UT) capsule Take 1 capsule (50,000 Units total) by mouth once a week. 01/30/23   Quillian Quince D, MD  escitalopram (LEXAPRO) 20 MG tablet Take 1 tablet (20 mg total) by mouth daily. 08/24/21   Shirlean Mylar, MD  famotidine (PEPCID) 20 MG tablet Take 1 tablet (20 mg total) by mouth 2 (two) times daily. 11/07/21   Shirlean Mylar, MD  ibuprofen (ADVIL) 200 MG tablet Take 200 mg by mouth every 6 (six) hours as needed for mild pain.    [provider]  Boris Lown Oil (OMEGA-3) 500 MG CAPS Take 500 mg by mouth daily.    [provider]   levothyroxine (SYNTHROID) 75 MCG tablet TAKE 1 TABLET(75 MCG) BY MOUTH DAILY 10/01/22   Shelby Mattocks, DO  linaclotide [facility]) 145 MCG CAPS capsule Take 1 capsule (145 mcg total) by mouth daily before breakfast. Take 30 minutes before a meal 02/12/23 02/07/24  Armbruster, Willaim Rayas, MD  metFORMIN (GLUCOPHAGE) 500 MG tablet Take 1 tablet (500 mg total) by mouth daily with breakfast. 12/18/22   Rayburn, Fanny Bien, PA-C  Multiple Vitamins-Minerals (MULTIVITAMIN WITH MINERALS) tablet Take 1 tablet by mouth daily.    [provider]  nystatin (MYCOSTATIN) 100000 UNIT/ML suspension Use as directed 5 mLs (500,000 Units total) in the mouth or throat 4 (four) times daily. 10/09/22   Junie Spencer, FNP  ondansetron (ZOFRAN-ODT) 4 MG disintegrating tablet Take 1 tablet (4 mg total) by mouth every 8 (eight) hours as needed for nausea or vomiting. 01/29/23   Armbruster, Willaim Rayas, MD  pantoprazole (PROTONIX) 40 MG tablet Take 1 tablet (40 mg total) by mouth daily. 08/27/22   Doree Albee, PA-C  pantoprazole (PROTONIX) 40 MG tablet Take 1 tablet (40 mg total) by mouth 2 (two) times daily for 14 days. 02/27/23 03/13/23  Linwood Dibbles, MD  Psyllium (FIBER) 28.3 % POWD Take 1 Package by mouth 2 (two) times daily. 03/17/20   Leeroy Bock, MD  Allergies    Patient has no known allergies.    Review of Systems   Review of Systems  Cardiovascular:  Positive for chest pain.    Physical Exam Updated Vital Signs BP (!) 101/56   Pulse (!) 50   Temp 98.2 F (36.8 C) (Oral)   Resp 15   Ht 1.575 m (5\' 2" )   Wt 67.6 kg   LMP 12/19/2019   SpO2 100%   BMI 27.25 kg/m  Physical Exam Vitals and nursing note reviewed.  Constitutional:      General: She is not in acute distress.    Appearance: She is well-developed.  HENT:     Head: Normocephalic and atraumatic.     Right Ear: External ear normal.     Left Ear: External ear normal.  Eyes:     General: No scleral icterus.       Right  eye: No discharge.        Left eye: No discharge.     Conjunctiva/sclera: Conjunctivae normal.  Neck:     Trachea: No tracheal deviation.  Cardiovascular:     Rate and Rhythm: Normal rate and regular rhythm.  Pulmonary:     Effort: Pulmonary effort is normal. No respiratory distress.     Breath sounds: Normal breath sounds. No stridor. No wheezing or rales.  Abdominal:     General: Bowel sounds are normal. There is no distension.     Palpations: Abdomen is soft.     Tenderness: There is no abdominal tenderness. There is no guarding or rebound.  Musculoskeletal:        General: No tenderness or deformity.     Cervical back: Neck supple.  Skin:    General: Skin is warm and dry.     Findings: No rash.  Neurological:     General: No focal deficit present.     Mental Status: She is alert.     Cranial Nerves: No cranial nerve deficit, dysarthria or facial asymmetry.     Sensory: No sensory deficit.     Motor: No abnormal muscle tone or seizure activity.     Coordination: Coordination normal.  Psychiatric:        Mood and Affect: Mood normal.     ED Results / Procedures / Treatments   Labs (all labs ordered are listed, but only abnormal results are displayed) Labs Reviewed  BASIC METABOLIC PANEL - Abnormal; Notable for the following components:      Result Value   Calcium 8.6 (*)    All other components within normal limits  CBC  TROPONIN I (HIGH SENSITIVITY)  TROPONIN I (HIGH SENSITIVITY)    EKG EKG Interpretation Date/Time:  Thursday February 27 2023 11:42:04 EDT Ventricular Rate:  61 PR Interval:  130 QRS Duration:  89 QT Interval:  420 QTC Calculation: 423 R Axis:   75  Text Interpretation: Sinus rhythm Low voltage, precordial leads No significant change since last tracing Confirmed by Linwood Dibbles [phone]) on 02/27/2023 12:03:37 PM  Radiology DG Chest 2 View  Result Date: 02/27/2023 CLINICAL DATA:  Chest pain, back pain EXAM: CHEST - 2 VIEW COMPARISON:   03/13/2021 FINDINGS: Cardiac and mediastinal contours are within normal limits. No focal pulmonary opacity. No pleural effusion or pneumothorax. No acute osseous abnormality. IMPRESSION: No acute cardiopulmonary process. Electronically Signed   By: Wiliam Ke M.D.   On: 02/27/2023 14:33    Procedures Procedures    Medications Ordered in ED Medications  aspirin chewable tablet 324  mg (324 mg Oral Given 02/27/23 1224)    ED Course/ Medical Decision Making/ A&P Clinical Course as of 02/27/23 1514  Thu Feb 27, 2023  1506 CBC normal.  Metabolic panel normal.  Serial troponins normal [JK]  1506 Chest x-ray without acute abnormalities [JK]    Clinical Course User Index [JK] Linwood Dibbles, MD             HEART Score: 2                Geneva (Revised) Score: 0, Geneva Score Interpretation: Low Risk Group: 7-9% incidence of pulmonary embolism from several studies PERC Score: 0, PERC Score Interpretation: No need for further workup, as <2% chance of PE.  If no criteria are positive and clinicians pre-test probability is <15%, PERC Rule criteria are satisfied Medical Decision Making Problems Addressed: Chest pain, unspecified type: acute illness or injury that poses a threat to life or bodily functions  Amount and/or Complexity of Data Reviewed Labs: ordered. Decision-making details documented in ED Course. Radiology: ordered and independent interpretation performed.  Risk OTC drugs. Prescription drug management.   Presented to ED with complaints of pain.  Low risk heart score.  Serial troponins negative.  Doubt ACS.  Pneumonia pneumothorax on chest x-ray.  Low risk for PE, PERC negative, doubt pulmonary embolism.  Possible symptoms could be related to acid reflux.  Patient does have a history of same symptoms not typical for gastroesophageal reflux  Evaluation and diagnostic testing in the emergency department does not suggest an emergent condition requiring admission or immediate  intervention beyond what has been performed at this time.  The patient is safe for discharge and has been instructed to return immediately for worsening symptoms, change in symptoms or any other concerns.         Final Clinical Impression(s) / ED Diagnoses Final diagnoses:  Chest pain, unspecified type    Rx / DC Orders ED Discharge Orders          Ordered    naproxen (NAPROSYN) 375 MG tablet  2 times daily        02/27/23 1511    pantoprazole (PROTONIX) 40 MG tablet  2 times daily        02/27/23 1511              Linwood Dibbles, MD 02/27/23 1515

## 2023-03-03 ENCOUNTER — Telehealth: Payer: Self-pay | Admitting: *Deleted

## 2023-03-03 NOTE — Telephone Encounter (Signed)
-----   Message from Nurse Alona Bene B sent at 02/14/2023  1:34 PM EDT ----- Reminder to call patient to come in to pick up the [employer] stool test. Needs to be picked up by 02/26/23 or 03/03/23.

## 2023-03-03 NOTE — Telephone Encounter (Signed)
Called patient and was informed she had picked up the [employer] stool kit on 02/27/23 and brought back to the lab on 02/28/23. Called the lab and was informed they do not  have the stool sample. Called [employer], the company, and was also informed they do not have the stool sample. Patient called back to see if possible she would come to pick a [employer] stool kit again to test. Patient agreed and stated she would be in today to get the stool kit from the second floor.

## 2023-03-04 ENCOUNTER — Encounter: Payer: Medicaid Other | Attending: Physician Assistant | Admitting: Physical Therapy

## 2023-03-04 ENCOUNTER — Telehealth: Payer: Self-pay | Admitting: *Deleted

## 2023-03-04 ENCOUNTER — Encounter: Payer: Self-pay | Admitting: Physical Therapy

## 2023-03-04 DIAGNOSIS — R102 Pelvic and perineal pain: Secondary | ICD-10-CM | POA: Insufficient documentation

## 2023-03-04 DIAGNOSIS — M62838 Other muscle spasm: Secondary | ICD-10-CM | POA: Diagnosis not present

## 2023-03-04 DIAGNOSIS — M6281 Muscle weakness (generalized): Secondary | ICD-10-CM | POA: Diagnosis not present

## 2023-03-04 DIAGNOSIS — A048 Other specified bacterial intestinal infections: Secondary | ICD-10-CM | POA: Diagnosis not present

## 2023-03-04 NOTE — Telephone Encounter (Signed)
-----   Message from Nurse Alona Bene B sent at 02/14/2023  1:34 PM EDT ----- Reminder to call patient to come in to pick up the [employer] stool test. Needs to be picked up by 02/26/23 or 03/03/23.

## 2023-03-04 NOTE — Telephone Encounter (Signed)
Patient picked up the [employer] stool kit on 03/03/23 from the second floor. Directions in the kit, in both the [place] and the Spanish language.

## 2023-03-04 NOTE — Patient Instructions (Signed)

## 2023-03-04 NOTE — Therapy (Signed)
OUTPATIENT PHYSICAL THERAPY FEMALE PELVIC EVALUATION   Patient Name: Carol Welch MRN: [id number] DOB:1975-10-21, 47 y.o., female Today's Date: 03/04/2023  END OF SESSION:  PT End of Session - 03/04/23 0941     Visit Number 7    Date for PT Re-Evaluation 08/26/23    Authorization Type [employer]    Authorization Time Period 10/22-11/27/2024    Authorization - Visit Number 1    Authorization - Number of Visits 4    PT Start Time 0940    PT Stop Time 1025    PT Time Calculation (min) 45 min    Activity Tolerance Patient tolerated treatment well    Behavior During Therapy WFL for tasks assessed/performed             Past Medical History:  Diagnosis Date   Anal fissure    Colitis    Depression    Edema, lower extremity    Fatty liver    H. pylori infection    Hypothyroidism    Low blood pressure    Monoallelic mutation of CDKN2A gene    PONV (postoperative nausea and vomiting)    SVD (spontaneous vaginal delivery) 06/30/2015   Vitamin D deficiency    Past Surgical History:  Procedure Laterality Date   BREAST BIOPSY Right 07/09/2022   [place] RT BREAST BX W LOC DEV 1ST LESION IMG BX SPEC US GUIDE 07/09/2022 [facility]   BREAST CYST ASPIRATION Right 02/13/2018   CYSTOSCOPY N/A 12/30/2019   Procedure: CYSTOSCOPY;  Surgeon: Sherian Rein, MD;  Location: [facility];  Service: Gynecology;  Laterality: N/A;   ENDOVENOUS ABLATION SAPHENOUS VEIN W/ LASER Right 10/08/2018   endovenous laser ablation right saphenous vein by Waverly Ferrari MD   LAPAROSCOPIC VAGINAL HYSTERECTOMY WITH SALPINGECTOMY Bilateral 12/30/2019   Procedure: LAPAROSCOPIC ASSISTED VAGINAL HYSTERECTOMY WITH SALPINGECTOMY;  Surgeon: Sherian Rein, MD;  Location: [facility];  Service: Gynecology;  Laterality: Bilateral;   Patient Active Problem List   Diagnosis Date Noted   Pre-diabetes 12/18/2022   Other fatigue 12/18/2022   Worried well 10/09/2022    Obesity, Beginning BMI 30.54 10/09/2022   Genetic testing 10/02/2022   Monoallelic mutation of CDKN2A gene 10/02/2022   Vaginal itching 10/01/2022   H. 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   Neck pain 01/18/2020   S/P laparoscopic assisted vaginal hysterectomy (LAVH) 12/30/2019   External thrombosed hemorrhoids 09/11/2019   MDD (major depressive disorder) 08/24/2019   Orthostatic hypotension 08/24/2019   Hypothyroidism 07/06/2018   Family history of congenital anomaly 08/27/2016    PCP: Tiffany Kocher, DO  REFERRING PROVIDER: Benancio Deeds, MD   REFERRING DIAG:  R10.13 (ICD-10-CM) - Postprandial epigastric pain  K92.0 (ICD-10-CM) - Hematemesis with nausea  K59.04 (ICD-10-CM) - Chronic idiopathic constipation  N94.10 (ICD-10-CM) - Dyspareunia in female    THERAPY DIAG:  Other muscle spasm  Pelvic pain  Muscle weakness (generalized)  Rationale for Evaluation and Treatment: Rehabilitation  ONSET DATE: 2022  SUBJECTIVE:  SUBJECTIVE STATEMENT: My tests came back negative. I am taking a medication and it helps. I have massaged around the perineal area and it helps with intercourse but still pain. I feel pain deep and initial penile penetration vaginally. When I eat my stomach my hurt. I had a digestive test that was negative.      PAIN:  Are you having pain? Yes NPRS scale: 5/10 Pain location:  lower abdomen  Pain type: bloating, heaviness, bloating Pain  description: intermittent , happens 80% of the time  Aggravating factors: running, not having a bowel movement,  Relieving factors: lay down and stroke stomach upward  PRECAUTIONS: None  RED FLAGS: None   WEIGHT BEARING RESTRICTIONS: No  FALLS:  Has patient fallen in last 6 months? No  LIVING ENVIRONMENT: Lives with: lives with their family  OCCUPATION: walking, weights  PLOF: Independent  PATIENT GOALS: reduce discomfort, easier to have a bowel movement  PERTINENT HISTORY:  Anal fissure; Colitis; Hypothyroidism;   BOWEL MOVEMENT: Pain with bowel movement: Yes, intermittent 5/10 without medication Type of bowel movement:Type (Bristol Stool Scale) type 4, Frequency daily, Strain Yes, and Splinting none Fully empty rectum: No Leakage: No Fiber supplement: Yes: Metamucil  URINATION: Pain with urination: No Fully empty bladder: Yes: but at times feels she has more urine left and sit back down to empty bladder further Stream: Strong Urgency: No Frequency: average Leakage:  none Pads: No  INTERCOURSE: Pain with intercourse: Initial Penetration and During Penetration, Patient feels a little spot hurts that is upward to the left Ability to have vaginal penetration:  Yes:   Climax: sometimes, struggle since hysterectectomy Marinoff Scale: 2/3  PREGNANCY: Vaginal deliveries 5, last delivery 03/2018 Tearing did have tearing with the first few and needed stitches, did not have stitches on fourth but had tearing   PROLAPSE: None   OBJECTIVE:   DIAGNOSTIC FINDINGS:  none  PATIENT SURVEYS:  PFIQ-7 133; CRAIQ-7: 71; POPIQ-7 62 12/31/22 PFIQ-7 181; CRAIQ-7: 86; POPIQ-7 95 03/04/23: PFIQ-7 148; CRAIQ-7: 71; POPIQ-7 76   COGNITION: Overall cognitive status: Within functional limits for tasks assessed      POSTURE: No Significant postural limitations  PELVIC ALIGNMENT: ASIS are equal  LUMBARAROM/PROM:  A/PROM A/PROM  eval 12/31/22 03/04/23  Flexion Decreased  by 25% full full  Extension Decreased by 50% full full  Right lateral flexion Decreased by 25% Decreased by 25% full  Left lateral flexion full full full  Right rotation Decreased by 25% Decreased by 25% Decreased by 25%  Left rotation Decreased by 25% Decreased by 25% full   (Blank rows = not tested)  LOWER EXTREMITY ROM:  Passive ROM Right eval Left eval Right/Left 03/04/23  Hip external rotation 40 40 45   (Blank rows = not tested)  LOWER EXTREMITY MMT:  MMT Right eval Right  12/31/22 Right 03/04/23  Hip flexion 5/5 5/5 5/5  Hip extension 4/5 4/5 4/5  Hip abduction 3/5 3/5 3+/5  Hip adduction 4/5 3/5 4/5   PALPATION:   General  throughout the abdomen, decreased diaphragm mobility and lower rib cage mobility                External Perineal Exam along the posterior 1/3 of the external rectum; not able to do internal through the rectum due to pain and tightness, tenderness located on bil. Ischiocavernosus and bulbocavernosus  Internal Pelvic Floor tenderness located in bilateral obturator internist, levator ani, and above the bladder on the anterior wall  Patient confirms identification and approves PT to assess internal pelvic floor and treatment Yes  PELVIC MMT:   MMT eval 03/04/23  Vaginal 2/5   Internal Anal Sphincter    External Anal Sphincter    Puborectalis    Diastasis Recti    (Blank rows = not tested)        TONE: increased  PROLAPSE: Anterior wall weakness just above the introitus, posterior wall weakness just above the introitus  TODAY'S TREATMENT:  03/04/23 Manual: Soft tissue mobilization: To assess for dry needling Manual work to the left side of the pelvic floor, perineal body, superior transverse, puborectalis, coccygeus, external anal sphincter, and iliococcygeus, obturator internist to elongate after dry needling and palpate for further tenderness Trigger Point Dry-Needling  Treatment instructions: Expect mild  to moderate muscle soreness. S/S of pneumothorax if dry needled over a lung field, and to seek immediate medical attention should they occur. Patient verbalized understanding of these instructions and education.  Patient Consent Given: Yes Education handout provided: Yes Muscles treated: perineal body, left superior transverse, puborectalis, iliococcygeus, coccygeus, obturator internist Electrical stimulation performed: No Parameters: N/A Treatment response/outcome: trigger point response and elongation of muscle     01/28/23 Manual: Soft tissue mobilization:  Manual work to bilateral diaphragm Circular massage to abdomen to improve peristalic motion Myofascial release: Fascial release along the upper abdomen going through the different layers Fascial release along the mesenteric root Fascial release along the lower abdomen going through the restrictions.  Quadruped therapist pulling upward pulling of abdomen as patient rocks back and forth and diagonals Exercises: Stretches/mobility: Cat cow 20 x  Thread the needle 4 x Pigeon pose holding 1 min bil.  Long sit hip adductor stretch holding 1 min V stretch with leaning to one leg holding 30 sec bil.     01/14/23 Manual: Soft tissue mobilization: Patient laying on her side while therapist performs manual work along the obturator internist, perineal body, levator ani, coccygeus to elongate the tissue and work through trigger points Myofascial release: Fascial release around the rectum externally to elongate the tissue Neuromuscular re-education: Down training: Therapist finger at the anus feeling for the downward glide of the pelvic floor muscles as she performs diaphragmatic breathing and and breathing out as she is having a bowel movement  while laying on her side      PATIENT EDUCATION: 12/24/22 Education details: Access Code: [email or web address], information on dry needling Person educated: Patient Education method: Explanation,  Demonstration, Tactile cues, Verbal cues, and Handouts Education comprehension: verbalized understanding, returned demonstration, verbal cues required, tactile cues required, and needs further education    HOME EXERCISE PROGRAM: 12/17/22 Access Code: [email or web address] URL: [email or web address] Date: 12/17/2022 Prepared by: Eulis Foster  Exercises - Supine Piriformis Stretch with Leg Straight  - 1 x daily - 7 x weekly - 1 sets - 2 reps - 30 sec hold - Supine Hamstring Stretch  - 1 x daily - 7 x weekly - 1 sets - 2 reps - 30 sec hold - Happy Baby with Pelvic Floor Lengthening  - 1 x daily - 7 x weekly - 1 sets - 1 reps - 30 sec hold - Cat Cow  - 1 x daily - 7 x weekly - 1 sets - 10 reps - Child's Pose Stretch  - 1 x daily - 7 x weekly - 1 sets - 1 reps - 30 sec hold -  Quadruped Full Range Thoracic Rotation with Reach  - 1 x daily - 7 x weekly - 2 reps - 5 sec hold - Prone Press Up  - 1 x daily - 7 x weekly - 1 sets - 10 reps  ASSESSMENT:  CLINICAL IMPRESSION: Patient is a 47 y.o. female who was seen today for physical therapy  treatment for dyspareunia and constipation.  Patient Patient urinates 2 times at night instead of 6 times. She has trigger points in the perineal body, levator ani and obturator internist externally. She was able to bulge her pelvic floor with diaphragmatic breathing. She had trouble keeping her pelvic floor bulged while breathing out to facilitate a bowel movement.  Patient PFIQ-7 score went from 181 to 148.she has improved ROM of her lumbar and increased strength of right hip. She has less pain with penile penetration vaginally. The pain happens with initial penetration and anterior left vaginal canal. She will strain to have a bowel movement if she is not taking her medication and the stool is skinny.  Patient will benefit from skilled therapy to relax her pelvic floor muscles to improve defecation and vaginal penetration.   OBJECTIVE IMPAIRMENTS: decreased  coordination, decreased strength, increased fascial restrictions, increased muscle spasms, and pain.   ACTIVITY LIMITATIONS: toileting  PARTICIPATION LIMITATIONS: interpersonal relationship  PERSONAL FACTORS: 1 comorbidity: Anal fissure; Colitis; Hypothyroidism;  are also affecting patient's functional outcome.   REHAB POTENTIAL: Good  CLINICAL DECISION MAKING: Stable/uncomplicated  EVALUATION COMPLEXITY: Low   GOALS: Goals reviewed with patient? Yes  SHORT TERM GOALS: Target date: 12/30/22  Patient is independent with initial HEP.  Baseline:Not educated yet Goal status: Met 12/24/22   LONG TERM GOALS: Target date: 08/26/23  Patient is independent with advanced HEP for stretches and core strength.  Baseline: not educated yet Goal status: ongoing 01/28/23  2.  Patient is independent with abdominal massage to improve defecation.  Baseline: Not educated yet Goal status: met 12/31/22  3.  Patient is abel to defecate with pain level </= 1/10 and straining reduced >/= 75% due to improved lengthening of the muscles.  Baseline: pain level 6/10 and has to strain.  Goal status: ongoing 01/28/23  4.  Patient is able to have penile penetration vaginally with pain level </= 1-2/10.  Baseline: Pain level 5/10 Goal status: ongoing 01/28/23  5.  Patient is able to fully empty her bladder due to the ability to relax her pelvic floor.  Baseline: Has to sit 2 times Goal status: ongoing 01/28/23    PLAN:  PT FREQUENCY: 1x/week  PT DURATION: 6 months  PLANNED INTERVENTIONS: Therapeutic exercises, Therapeutic activity, Neuromuscular re-education, Patient/Family education, Joint mobilization, Aquatic Therapy, Electrical stimulation, Spinal mobilization, Cryotherapy, Moist heat, Ultrasound, Biofeedback, and Manual therapy  PLAN FOR NEXT SESSION:  manual work to pelvic floor, dry needling to pelvic floor ischiocavernosus, bulbocavernosus, perineal body and levator ani if she is up to it,  prone with manual work to the left vaginal wall,  quadruped lift extremity   Eulis Foster, PT 03/04/23 11:30 AM

## 2023-03-05 ENCOUNTER — Telehealth: Payer: Self-pay | Admitting: Gastroenterology

## 2023-03-05 DIAGNOSIS — A048 Other specified bacterial intestinal infections: Secondary | ICD-10-CM

## 2023-03-05 NOTE — Telephone Encounter (Signed)
Results of H. pylori Diatherix swab returned and are positive for persistent H. pylori infection.  She has clarithromycin resistance.  She has unfortunately failed 2 rounds of antibiotic treatment for this at this point with persistent infection.   For salvage therapy recommending a course of Talicia - 1 cap TID for 14 days.  Carol Welch can you let her know and order this for her.  I hope insurance will cover this for her.  1 month after therapy she will need another H. pylori Diatherix swab for eradication  Thanks

## 2023-03-06 MED ORDER — TALICIA 250-12.5-10 MG PO CPDR: 1.0000 | DELAYED_RELEASE_CAPSULE | Freq: Three times a day (TID) | ORAL | 0 refills | Status: AC

## 2023-03-06 NOTE — Telephone Encounter (Signed)
Carol Welch (Key: [id number]) PA Case ID #: [id number] Need Help? Call us at [phone] Outcome Denied today by [facility] Medicaid 2017  Denied. Per the health plan's preferred drug list, at least 1 preferred drugs must be tried before requesting this drug or tell us why the member cannot try any preferred alternatives. Please send [place] supporting chart notes and lab results. Here is list of preferred alternatives: Pylera Capsule. Note: Some preferred drug(s) may have quantity limits. Refer to the health plan's preferred drug list for additional details.  Drug Talicia 250-12.5-10MG  dr capsules ePA cloud logo Form [place] Medicaid of [facility] Electronic Prior Authorization Request Form [phone] NCPDP) Prescriber Instructions  *Advised insurance that patient has tried and failed 2 antibiotic rounds: Amoxicillin 1gm BID x 14days Clarithromycin 500mg  BIDx 14 days Flagyl 500mg  BIDx 14 days Continue protonix 40mg  BIDx 14 days  AND  Protonix 40mg  BIDx 14 days Pepto Bismol 2 tabs (262 mg each) 4 times a day x 14 d  Metronidazole 250 mg 4 times a day x 14 d  doxycycline 100 mg 2 times a day x 14 d  ___________________________________________  Would it be appropriate to try patient on Pylera instead?

## 2023-03-06 NOTE — Telephone Encounter (Signed)
Dr Adela Lank-  It appears that patient completed diatherix two times (02/28/23 and 03/04/23) as lab advised they did not have the specimen. 10/25 specimen showed positive for H Pylori (clarithromycin resistant). The 10/29 specimen was negative for H Pylori. How would you like to proceed?

## 2023-03-06 NOTE — Addendum Note (Signed)
Addended by: Richardson Chiquito on: 03/06/2023 09:48 AM   Modules accepted: Orders

## 2023-03-07 NOTE — Telephone Encounter (Signed)
Thanks for the note Dottie.  This is not straightforward unfortunately - she submitted another sample as the lab advised they did not have the specimen when they apparently did, and now we have conflicting results.  First - lab should be notified this occurred and not charge the patient for one of these tests if possible, not sure who that should be routed so but would submit safety zone portal entry  Can you let the patient know this happened and I think may be best to wait another month and submit another stool study for H pylori diatherix - if she has a repeated negative test we would hold off on therapy.   Of note, the 2 treatments she had previously failed  are the two options that they listed she had to try, so I would think [name] would be approved in this situation if she needed it. Thanks

## 2023-03-10 ENCOUNTER — Other Ambulatory Visit (INDEPENDENT_AMBULATORY_CARE_PROVIDER_SITE_OTHER): Payer: Self-pay | Admitting: Family Medicine

## 2023-03-10 ENCOUNTER — Ambulatory Visit (INDEPENDENT_AMBULATORY_CARE_PROVIDER_SITE_OTHER): Payer: Medicaid Other | Admitting: Family Medicine

## 2023-03-10 ENCOUNTER — Encounter (INDEPENDENT_AMBULATORY_CARE_PROVIDER_SITE_OTHER): Payer: Self-pay | Admitting: Family Medicine

## 2023-03-10 VITALS — BP 102/64 | HR 55 | Temp 97.5°F | Ht 62.0 in | Wt 143.0 lb

## 2023-03-10 DIAGNOSIS — E559 Vitamin D deficiency, unspecified: Secondary | ICD-10-CM | POA: Diagnosis not present

## 2023-03-10 DIAGNOSIS — Z6826 Body mass index (BMI) 26.0-26.9, adult: Secondary | ICD-10-CM

## 2023-03-10 DIAGNOSIS — A048 Other specified bacterial intestinal infections: Secondary | ICD-10-CM | POA: Diagnosis not present

## 2023-03-10 DIAGNOSIS — E669 Obesity, unspecified: Secondary | ICD-10-CM | POA: Diagnosis not present

## 2023-03-10 DIAGNOSIS — R7303 Prediabetes: Secondary | ICD-10-CM | POA: Diagnosis not present

## 2023-03-10 MED ORDER — ERGOCALCIFEROL 1.25 MG (50000 UT) PO CAPS: 50000.0000 [IU] | ORAL_CAPSULE | ORAL | 0 refills | Status: DC

## 2023-03-10 MED ORDER — METFORMIN HCL 500 MG PO TABS: 500.0000 mg | ORAL_TABLET | Freq: Every day | ORAL | 0 refills | Status: DC

## 2023-03-10 NOTE — Progress Notes (Signed)
[facility]LL[facility] AT [place] [address] [place] [address] Dept: [phone] Dept Fax: [phone]  SUBJECTIVE:  Chief Complaint: Obesity  Interim History: Patients first visit with me.  She normally sees Dr. Dalbert Garnet, Ines Bloomer and Dr. Manson Passey.  She ha been very stressed recently and so has struggled to follow plan as consistently as she did previously.  She's been very stressed with some genetic testing that has been done and had a mri biopsy done.  She is still getting full amount of protein in at every meal. For the next few weeks she has some upcoming appointments with follow up for the genetic tests.  She recognizes that the holidays are coming up and she wants to stay focused on plan to ensure she is getting more consistent nutrition in. She has been doing more outside yard work to help with stress release.   Carol Welch is here to discuss her progress with her obesity treatment plan. She is on the Category 1 Plan and states she is following her eating plan approximately 70 % of the time. She states she is eworking in the yard 60 minutes 3 times per week.   OBJECTIVE: Visit Diagnoses: Problem List Items Addressed This Visit       Other   Vitamin D insufficiency    No nausea, vomiting or muscle weakness.  She does feel low energy which she attributes to her low blood pressure. She needs a refill on her vitamin d today. Last level done in August of 2024 and was 41.      Relevant Medications   ergocalciferol (VITAMIN D2) 1.25 MG (50000 UT) capsule   BMI 26.0-26.9,adult   H. pylori infection    Just finished the [place].  This is the third treatment she has received.  She has an upcoming appointment with GI to test to see if the infection has been cleared.      Obesity, Beginning BMI 30.54 - Primary   Relevant Medications   metFORMIN (GLUCOPHAGE) 500 MG tablet   Pre-diabetes    Patient doing well on metformin.  She  has been eating slightly more carbohydrate than she was previously but attributes that to increased stress that she has been experiencing recently. She needs a refill of metformin today.  Not experiencing any cramping, bloating or diarrhea.      Relevant Medications   metFORMIN (GLUCOPHAGE) 500 MG tablet    Vitals Temp: (!) 97.5 F (36.4 C) BP: 102/64 Pulse Rate: (!) 55 SpO2: 100 %   Anthropometric Measurements Height: 5\' 2"  (1.575 m) Weight: 143 lb (64.9 kg) BMI (Calculated): 26.15 Weight at Last Visit: 145 lb Weight Lost Since Last Visit: 2 Weight Gained Since Last Visit: 0 Starting Weight: 167 lb Total Weight Loss (lbs): 24 lb (10.9 kg)   Body Composition  Body Fat %: 33 % Fat Mass (lbs): 47.4 lbs Muscle Mass (lbs): 91.4 lbs Total Body Water (lbs): 62 lbs Visceral Fat Rating : 6   Other Clinical Data Fasting: no Labs: no Today's Visit #: 21 Starting Date: 08/08/21     ASSESSMENT AND PLAN:  Diet: Carol Welch is currently in the action stage of change. As such, her goal is to continue with weight loss efforts. She has agreed to Category 1 Plan.  Exercise: Carol Welch has been instructed that some exercise is better than none for weight loss and overall health benefits.   Behavior Modification:  We discussed the following Behavioral Modification Strategies today:  increasing lean protein intake, increasing vegetables, emotional eating strategies , and holiday eating strategies. We discussed various medication options to help Carol Welch with her weight loss efforts and we both agreed to aim to follow plan 80% of the time, continue with therapy and to start thinking of a plan for exercise to implement in [place] 2025.  No follow-ups on file.Marland Kitchen She was informed of the importance of frequent follow up visits to maximize her success with intensive lifestyle modifications for her multiple health conditions.  Attestation Statements:   Reviewed by clinician on day of visit:  allergies, medications, problem list, medical history, surgical history, family history, social history, and previous encounter notes.   Time spent on visit including pre-visit chart review and post-visit care and charting was 20 minutes.    Reuben Likes, MD

## 2023-03-10 NOTE — Assessment & Plan Note (Signed)
No nausea, vomiting or muscle weakness.  She does feel low energy which she attributes to her low blood pressure. She needs a refill on her vitamin d today. Last level done in August of 2024 and was 41.

## 2023-03-10 NOTE — Assessment & Plan Note (Signed)
Just finished the [place].  This is the third treatment she has received.  She has an upcoming appointment with GI to test to see if the infection has been cleared.

## 2023-03-10 NOTE — Assessment & Plan Note (Signed)
Patient doing well on metformin.  She has been eating slightly more carbohydrate than she was previously but attributes that to increased stress that she has been experiencing recently. She needs a refill of metformin today.  Not experiencing any cramping, bloating or diarrhea.

## 2023-03-12 ENCOUNTER — Ambulatory Visit: Payer: Medicaid Other | Admitting: Vascular Surgery

## 2023-03-12 DIAGNOSIS — F331 Major depressive disorder, recurrent, moderate: Secondary | ICD-10-CM | POA: Diagnosis not present

## 2023-03-12 NOTE — Telephone Encounter (Signed)
I have spoken with patient to advise that unfortunately, we have gotten conflicting reports back regarding H Pylori. One test showed positive for continued H Pylori, while another 2 days later showed negative. Advised that we will have her wait for 1 month and would like to repeat testing at that time. Patient is advised to hold famotidine/pantoprazole 2 weeks prior to lab. She verbalizes understanding.

## 2023-03-12 NOTE — Telephone Encounter (Signed)
Left message with female for patient to call back.

## 2023-03-20 ENCOUNTER — Encounter: Payer: Medicaid Other | Attending: Physician Assistant | Admitting: Physical Therapy

## 2023-03-20 ENCOUNTER — Encounter: Payer: Self-pay | Admitting: Physical Therapy

## 2023-03-20 DIAGNOSIS — K920 Hematemesis: Secondary | ICD-10-CM | POA: Insufficient documentation

## 2023-03-20 DIAGNOSIS — R102 Pelvic and perineal pain: Secondary | ICD-10-CM | POA: Insufficient documentation

## 2023-03-20 DIAGNOSIS — K5904 Chronic idiopathic constipation: Secondary | ICD-10-CM | POA: Insufficient documentation

## 2023-03-20 DIAGNOSIS — M6281 Muscle weakness (generalized): Secondary | ICD-10-CM | POA: Diagnosis not present

## 2023-03-20 DIAGNOSIS — N941 Unspecified dyspareunia: Secondary | ICD-10-CM | POA: Insufficient documentation

## 2023-03-20 DIAGNOSIS — R1013 Epigastric pain: Secondary | ICD-10-CM | POA: Diagnosis not present

## 2023-03-20 DIAGNOSIS — M62838 Other muscle spasm: Secondary | ICD-10-CM | POA: Insufficient documentation

## 2023-03-20 NOTE — Therapy (Signed)
OUTPATIENT PHYSICAL THERAPY FEMALE PELVIC EVALUATION   Patient Name: Shonte Brignac MRN: [id number] DOB:[birth year], 47 y.o., female Today's Date: 03/20/2023  END OF SESSION:  PT End of Session - 03/20/23 1414     Visit Number 8    Date for PT Re-Evaluation 08/26/23    Authorization Type [employer]    Authorization Time Period 10/22-11/27/2024    Authorization - Visit Number 2    Authorization - Number of Visits 4    PT Start Time 1400    PT Stop Time 1445    PT Time Calculation (min) 45 min    Activity Tolerance Patient tolerated treatment well    Behavior During Therapy WFL for tasks assessed/performed             Past Medical History:  Diagnosis Date   Anal fissure    Colitis    Depression    Edema, lower extremity    Fatty liver    H. pylori infection    Hypothyroidism    Low blood pressure    Monoallelic mutation of CDKN2A gene    PONV (postoperative nausea and vomiting)    SVD (spontaneous vaginal delivery) 06/30/2015   Vitamin D deficiency    Past Surgical History:  Procedure Laterality Date   BREAST BIOPSY Right 07/09/2022   [place] RT BREAST BX W LOC DEV 1ST LESION IMG BX SPEC US GUIDE 07/09/2022 [facility]   BREAST CYST ASPIRATION Right 02/13/2018   CYSTOSCOPY N/A 12/30/2019   Procedure: CYSTOSCOPY;  Surgeon: Sherian Rein, MD;  Location: [facility];  Service: Gynecology;  Laterality: N/A;   ENDOVENOUS ABLATION SAPHENOUS VEIN W/ LASER Right 10/08/2018   endovenous laser ablation right saphenous vein by Waverly Ferrari MD   LAPAROSCOPIC VAGINAL HYSTERECTOMY WITH SALPINGECTOMY Bilateral 12/30/2019   Procedure: LAPAROSCOPIC ASSISTED VAGINAL HYSTERECTOMY WITH SALPINGECTOMY;  Surgeon: Sherian Rein, MD;  Location: [facility];  Service: Gynecology;  Laterality: Bilateral;   Patient Active Problem List   Diagnosis Date Noted   Pre-diabetes 12/18/2022   Other fatigue 12/18/2022   Worried well 10/09/2022    Obesity, Beginning BMI 30.54 10/09/2022   Genetic testing 10/02/2022   Monoallelic mutation of CDKN2A gene 10/02/2022   Vaginal itching 10/01/2022   H. 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   Neck pain 01/18/2020   S/P laparoscopic assisted vaginal hysterectomy (LAVH) 12/30/2019   External thrombosed hemorrhoids 09/11/2019   MDD (major depressive disorder) 08/24/2019   Orthostatic hypotension 08/24/2019   Hypothyroidism 07/06/2018   Family history of congenital anomaly 08/27/2016    PCP: Tiffany Kocher, DO  REFERRING PROVIDER: Benancio Deeds, MD   REFERRING DIAG:  R10.13 (ICD-10-CM) - Postprandial epigastric pain  K92.0 (ICD-10-CM) - Hematemesis with nausea  K59.04 (ICD-10-CM) - Chronic idiopathic constipation  N94.10 (ICD-10-CM) - Dyspareunia in female    THERAPY DIAG:  Other muscle spasm  Pelvic pain  Muscle weakness (generalized)  Rationale for Evaluation and Treatment: Rehabilitation  ONSET DATE: 2022  SUBJECTIVE:  SUBJECTIVE STATEMENT: I have been doing my exercises. Yesterday I tripped over a rake  and fell on the floor.    PAIN:  Are you having pain? Yes NPRS scale: 5/10 Pain location:  lower abdomen  Pain type: bloating, heaviness, bloating Pain description: intermittent , happens 80% of the time  Aggravating factors: running, not having a bowel movement,  Relieving factors: lay down and stroke stomach upward  PRECAUTIONS: None  RED  FLAGS: None   WEIGHT BEARING RESTRICTIONS: No  FALLS:  Has patient fallen in last 6 months? No  LIVING ENVIRONMENT: Lives with: lives with their family  OCCUPATION: walking, weights  PLOF: Independent  PATIENT GOALS: reduce discomfort, easier to have a bowel movement  PERTINENT HISTORY:  Anal fissure; Colitis; Hypothyroidism;   BOWEL MOVEMENT: Pain with bowel movement: Yes, intermittent 5/10 without medication Type of bowel movement:Type (Bristol Stool Scale) type 4, Frequency daily, Strain Yes, and Splinting none Fully empty rectum: No Leakage: No Fiber supplement: Yes: Metamucil  URINATION: Pain with urination: No Fully empty bladder: Yes: but at times feels she has more urine left and sit back down to empty bladder further Stream: Strong Urgency: No Frequency: average Leakage:  none Pads: No  INTERCOURSE: Pain with intercourse: Initial Penetration and During Penetration, Patient feels a little spot hurts that is upward to the left Ability to have vaginal penetration:  Yes:   Climax: sometimes, struggle since hysterectectomy Marinoff Scale: 2/3  PREGNANCY: Vaginal deliveries 5, last delivery 03/2018 Tearing did have tearing with the first few and needed stitches, did not have stitches on fourth but had tearing   PROLAPSE: None   OBJECTIVE:   DIAGNOSTIC FINDINGS:  none  PATIENT SURVEYS:  PFIQ-7 133; CRAIQ-7: 71; POPIQ-7 62 12/31/22 PFIQ-7 181; CRAIQ-7: 86; POPIQ-7 95 03/04/23: PFIQ-7 148; CRAIQ-7: 71; POPIQ-7 76   COGNITION: Overall cognitive status: Within functional limits for tasks assessed      POSTURE: No Significant postural limitations  PELVIC ALIGNMENT: ASIS are equal  LUMBARAROM/PROM:  A/PROM A/PROM  eval 12/31/22 03/04/23  Flexion Decreased by 25% full full  Extension Decreased by 50% full full  Right lateral flexion Decreased by 25% Decreased by 25% full  Left lateral flexion full full full  Right rotation Decreased by 25%  Decreased by 25% Decreased by 25%  Left rotation Decreased by 25% Decreased by 25% full   (Blank rows = not tested)  LOWER EXTREMITY ROM:  Passive ROM Right eval Left eval Right/Left 03/04/23  Hip external rotation 40 40 45   (Blank rows = not tested)  LOWER EXTREMITY MMT:  MMT Right eval Right  12/31/22 Right 03/04/23  Hip flexion 5/5 5/5 5/5  Hip extension 4/5 4/5 4/5  Hip abduction 3/5 3/5 3+/5  Hip adduction 4/5 3/5 4/5   PALPATION:   General  throughout the abdomen, decreased diaphragm mobility and lower rib cage mobility                External Perineal Exam along the posterior 1/3 of the external rectum; not able to do internal through the rectum due to pain and tightness, tenderness located on bil. Ischiocavernosus and bulbocavernosus                             Internal Pelvic Floor tenderness located in bilateral obturator internist, levator ani, and above the bladder on the anterior wall  Patient confirms identification and approves PT to assess internal pelvic floor and treatment  Yes  PELVIC MMT:   MMT eval 03/04/23  Vaginal 2/5   Internal Anal Sphincter    External Anal Sphincter    Puborectalis    Diastasis Recti    (Blank rows = not tested)        TONE: increased  PROLAPSE: Anterior wall weakness just above the introitus, posterior wall weakness just above the introitus  TODAY'S TREATMENT:  03/20/23 Manual: Soft tissue mobilization: To assess for dry needling Manual work to the right side of the pelvic floor, perineal body, superior transverse, puborectalis, coccygeus, external anal sphincter, and iliococcygeus, obturator internist to elongate after dry needling and palpate for further tenderness Trigger Point Dry-Needling  Treatment instructions: Expect mild to moderate muscle soreness. S/S of pneumothorax if dry needled over a lung field, and to seek immediate medical attention should they occur. Patient verbalized understanding of these  instructions and education.  Patient Consent Given: Yes Education handout provided: Yes Muscles treated: perineal body, left superior transverse, puborectalis, iliococcygeus, coccygeus, obturator internist Electrical stimulation performed: No Parameters: N/A Treatment response/outcome: trigger point response and elongation of muscle Exercises: Stretches/mobility: Cat cow 20 x  Childs pose holding 30 sec all 3 ways Pigeon pose holding 30 sec bil. V stretch to elongate the hip adductors holding 30 sec    03/04/23 Manual: Soft tissue mobilization: To assess for dry needling Manual work to the left side of the pelvic floor, perineal body, superior transverse, puborectalis, coccygeus, external anal sphincter, and iliococcygeus, obturator internist to elongate after dry needling and palpate for further tenderness Trigger Point Dry-Needling  Treatment instructions: Expect mild to moderate muscle soreness. S/S of pneumothorax if dry needled over a lung field, and to seek immediate medical attention should they occur. Patient verbalized understanding of these instructions and education.  Patient Consent Given: Yes Education handout provided: Yes Muscles treated: perineal body, left superior transverse, puborectalis, iliococcygeus, coccygeus, obturator internist Electrical stimulation performed: No Parameters: N/A Treatment response/outcome: trigger point response and elongation of muscle     01/28/23 Manual: Soft tissue mobilization:  Manual work to bilateral diaphragm Circular massage to abdomen to improve peristalic motion Myofascial release: Fascial release along the upper abdomen going through the different layers Fascial release along the mesenteric root Fascial release along the lower abdomen going through the restrictions.  Quadruped therapist pulling upward pulling of abdomen as patient rocks back and forth and diagonals Exercises: Stretches/mobility: Cat cow 20 x  Thread  the needle 4 x Pigeon pose holding 1 min bil.  Long sit hip adductor stretch holding 1 min V stretch with leaning to one leg holding 30 sec bil.     01/14/23 Manual: Soft tissue mobilization: Patient laying on her side while therapist performs manual work along the obturator internist, perineal body, levator ani, coccygeus to elongate the tissue and work through trigger points Myofascial release: Fascial release around the rectum externally to elongate the tissue Neuromuscular re-education: Down training: Therapist finger at the anus feeling for the downward glide of the pelvic floor muscles as she performs diaphragmatic breathing and and breathing out as she is having a bowel movement  while laying on her side      PATIENT EDUCATION: 12/24/22 Education details: Access Code: [email or web address], information on dry needling Person educated: Patient Education method: Explanation, Demonstration, Tactile cues, Verbal cues, and Handouts Education comprehension: verbalized understanding, returned demonstration, verbal cues required, tactile cues required, and needs further education    HOME EXERCISE PROGRAM: 12/17/22 Access Code: [email or web address] URL: [email or web address] Date: 12/17/2022 Prepared by: Elnita Maxwell  Wallace Cullens  Exercises - Supine Piriformis Stretch with Leg Straight  - 1 x daily - 7 x weekly - 1 sets - 2 reps - 30 sec hold - Supine Hamstring Stretch  - 1 x daily - 7 x weekly - 1 sets - 2 reps - 30 sec hold - Happy Baby with Pelvic Floor Lengthening  - 1 x daily - 7 x weekly - 1 sets - 1 reps - 30 sec hold - Cat Cow  - 1 x daily - 7 x weekly - 1 sets - 10 reps - Child's Pose Stretch  - 1 x daily - 7 x weekly - 1 sets - 1 reps - 30 sec hold - Quadruped Full Range Thoracic Rotation with Reach  - 1 x daily - 7 x weekly - 2 reps - 5 sec hold - Prone Press Up  - 1 x daily - 7 x weekly - 1 sets - 10 reps  ASSESSMENT:  CLINICAL IMPRESSION: Patient is a 47 y.o. female who was seen today  for physical therapy  treatment for dyspareunia and constipation.  Patient urinates 2 times at night instead of 6 times. She has trigger points in the perineal body, levator ani and obturator internist externally. She was able to bulge her pelvic floor with diaphragmatic breathing. She had trouble keeping her pelvic floor bulged while breathing out to facilitate a bowel movement.  Patient PFIQ-7 score went from 181 to 148.she has improved ROM of her lumbar and increased strength of right hip. She has less pain with penile penetration vaginally but mentally is scared due of pain.  The pain happens with initial penetration and anterior left vaginal canal. She will strain 40% less to have a bowel movement . Stool is still skinny.  Patient will benefit from skilled therapy to relax her pelvic floor muscles to improve defecation and vaginal penetration.   OBJECTIVE IMPAIRMENTS: decreased coordination, decreased strength, increased fascial restrictions, increased muscle spasms, and pain.   ACTIVITY LIMITATIONS: toileting  PARTICIPATION LIMITATIONS: interpersonal relationship  PERSONAL FACTORS: 1 comorbidity: Anal fissure; Colitis; Hypothyroidism;  are also affecting patient's functional outcome.   REHAB POTENTIAL: Good  CLINICAL DECISION MAKING: Stable/uncomplicated  EVALUATION COMPLEXITY: Low   GOALS: Goals reviewed with patient? Yes  SHORT TERM GOALS: Target date: 12/30/22  Patient is independent with initial HEP.  Baseline:Not educated yet Goal status: Met 12/24/22   LONG TERM GOALS: Target date: 08/26/23  Patient is independent with advanced HEP for stretches and core strength.  Baseline: not educated yet Goal status: ongoing 01/28/23  2.  Patient is independent with abdominal massage to improve defecation.  Baseline: Not educated yet Goal status: met 12/31/22  3.  Patient is abel to defecate with pain level </= 1/10 and straining reduced >/= 75% due to improved lengthening of the  muscles.  Baseline: pain level 6/10 and has to strain.  Goal status: ongoing 01/28/23  4.  Patient is able to have penile penetration vaginally with pain level </= 1-2/10.  Baseline: Pain level 5/10 Goal status: ongoing 01/28/23  5.  Patient is able to fully empty her bladder due to the ability to relax her pelvic floor.  Baseline: Has to sit 2 times Goal status: ongoing 01/28/23    PLAN:  PT FREQUENCY: 1x/week  PT DURATION: 6 months  PLANNED INTERVENTIONS: Therapeutic exercises, Therapeutic activity, Neuromuscular re-education, Patient/Family education, Joint mobilization, Aquatic Therapy, Electrical stimulation, Spinal mobilization, Cryotherapy, Moist heat, Ultrasound, Biofeedback, and Manual therapy  PLAN FOR NEXT SESSION:  manual  work to pelvic floor, dry needling to pelvic floor ischiocavernosus, bulbocavernosus, perineal body and levator ani if she is up to it, prone with manual work to the left vaginal wall,  quadruped lift extremity   Eulis Foster, PT 03/20/23 2:59 PM

## 2023-03-20 NOTE — Therapy (Signed)
OUTPATIENT PHYSICAL THERAPY FEMALE PELVIC EVALUATION   Patient Name: Carol Welch MRN: [id number] DOB:06/03/1975, 47 y.o., female Today's Date: 03/20/2023  END OF SESSION:  PT End of Session - 03/20/23 1414     Visit Number 8    Date for PT Re-Evaluation 08/26/23    Authorization Type [employer]    Authorization Time Period 10/22-11/27/2024    Authorization - Visit Number 2    Authorization - Number of Visits 4    PT Start Time 1400    PT Stop Time 1445    PT Time Calculation (min) 45 min    Activity Tolerance Patient tolerated treatment well    Behavior During Therapy WFL for tasks assessed/performed             Past Medical History:  Diagnosis Date   Anal fissure    Colitis    Depression    Edema, lower extremity    Fatty liver    H. pylori infection    Hypothyroidism    Low blood pressure    Monoallelic mutation of CDKN2A gene    PONV (postoperative nausea and vomiting)    SVD (spontaneous vaginal delivery) 06/30/2015   Vitamin D deficiency    Past Surgical History:  Procedure Laterality Date   BREAST BIOPSY Right 07/09/2022   [place] RT BREAST BX W LOC DEV 1ST LESION IMG BX SPEC US GUIDE 07/09/2022 [facility]   BREAST CYST ASPIRATION Right 02/13/2018   CYSTOSCOPY N/A 12/30/2019   Procedure: CYSTOSCOPY;  Surgeon: Sherian Rein, MD;  Location: [facility];  Service: Gynecology;  Laterality: N/A;   ENDOVENOUS ABLATION SAPHENOUS VEIN W/ LASER Right 10/08/2018   endovenous laser ablation right saphenous vein by Waverly Ferrari MD   LAPAROSCOPIC VAGINAL HYSTERECTOMY WITH SALPINGECTOMY Bilateral 12/30/2019   Procedure: LAPAROSCOPIC ASSISTED VAGINAL HYSTERECTOMY WITH SALPINGECTOMY;  Surgeon: Sherian Rein, MD;  Location: [facility];  Service: Gynecology;  Laterality: Bilateral;   Patient Active Problem List   Diagnosis Date Noted   Pre-diabetes 12/18/2022   Other fatigue 12/18/2022   Worried well 10/09/2022    Obesity, Beginning BMI 30.54 10/09/2022   Genetic testing 10/02/2022   Monoallelic mutation of CDKN2A gene 10/02/2022   Vaginal itching 10/01/2022   H. 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   Neck pain 01/18/2020   S/P laparoscopic assisted vaginal hysterectomy (LAVH) 12/30/2019   External thrombosed hemorrhoids 09/11/2019   MDD (major depressive disorder) 08/24/2019   Orthostatic hypotension 08/24/2019   Hypothyroidism 07/06/2018   Family history of congenital anomaly 08/27/2016    PCP: Tiffany Kocher, DO  REFERRING PROVIDER: Benancio Deeds, MD   REFERRING DIAG:  R10.13 (ICD-10-CM) - Postprandial epigastric pain  K92.0 (ICD-10-CM) - Hematemesis with nausea  K59.04 (ICD-10-CM) - Chronic idiopathic constipation  N94.10 (ICD-10-CM) - Dyspareunia in female    THERAPY DIAG:  Other muscle spasm  Pelvic pain  Muscle weakness (generalized)  Rationale for Evaluation and Treatment: Rehabilitation  ONSET DATE: 2022  SUBJECTIVE:  SUBJECTIVE STATEMENT: I have been doing my exercises. Yesterday I tripped over a rake  and fell on the floor.    PAIN:  Are you having pain? Yes NPRS scale: 5/10 Pain location:  lower abdomen  Pain type: bloating, heaviness, bloating Pain description: intermittent , happens 80% of the time  Aggravating factors: running, not having a bowel movement,  Relieving factors: lay down and stroke stomach upward  PRECAUTIONS: None  RED  FLAGS: None   WEIGHT BEARING RESTRICTIONS: No  FALLS:  Has patient fallen in last 6 months? No  LIVING ENVIRONMENT: Lives with: lives with their family  OCCUPATION: walking, weights  PLOF: Independent  PATIENT GOALS: reduce discomfort, easier to have a bowel movement  PERTINENT HISTORY:  Anal fissure; Colitis; Hypothyroidism;   BOWEL MOVEMENT: Pain with bowel movement: Yes, intermittent 5/10 without medication Type of bowel movement:Type (Bristol Stool Scale) type 4, Frequency daily, Strain Yes, and Splinting none Fully empty rectum: No Leakage: No Fiber supplement: Yes: Metamucil  URINATION: Pain with urination: No Fully empty bladder: Yes: but at times feels she has more urine left and sit back down to empty bladder further Stream: Strong Urgency: No Frequency: average Leakage:  none Pads: No  INTERCOURSE: Pain with intercourse: Initial Penetration and During Penetration, Patient feels a little spot hurts that is upward to the left Ability to have vaginal penetration:  Yes:   Climax: sometimes, struggle since hysterectectomy Marinoff Scale: 2/3  PREGNANCY: Vaginal deliveries 5, last delivery 03/2018 Tearing did have tearing with the first few and needed stitches, did not have stitches on fourth but had tearing   PROLAPSE: None   OBJECTIVE:   DIAGNOSTIC FINDINGS:  none  PATIENT SURVEYS:  PFIQ-7 133; CRAIQ-7: 71; POPIQ-7 62 12/31/22 PFIQ-7 181; CRAIQ-7: 86; POPIQ-7 95 03/04/23: PFIQ-7 148; CRAIQ-7: 71; POPIQ-7 76   COGNITION: Overall cognitive status: Within functional limits for tasks assessed      POSTURE: No Significant postural limitations  PELVIC ALIGNMENT: ASIS are equal  LUMBARAROM/PROM:  A/PROM A/PROM  eval 12/31/22 03/04/23  Flexion Decreased by 25% full full  Extension Decreased by 50% full full  Right lateral flexion Decreased by 25% Decreased by 25% full  Left lateral flexion full full full  Right rotation Decreased by 25%  Decreased by 25% Decreased by 25%  Left rotation Decreased by 25% Decreased by 25% full   (Blank rows = not tested)  LOWER EXTREMITY ROM:  Passive ROM Right eval Left eval Right/Left 03/04/23  Hip external rotation 40 40 45   (Blank rows = not tested)  LOWER EXTREMITY MMT:  MMT Right eval Right  12/31/22 Right 03/04/23  Hip flexion 5/5 5/5 5/5  Hip extension 4/5 4/5 4/5  Hip abduction 3/5 3/5 3+/5  Hip adduction 4/5 3/5 4/5   PALPATION:   General  throughout the abdomen, decreased diaphragm mobility and lower rib cage mobility                External Perineal Exam along the posterior 1/3 of the external rectum; not able to do internal through the rectum due to pain and tightness, tenderness located on bil. Ischiocavernosus and bulbocavernosus                             Internal Pelvic Floor tenderness located in bilateral obturator internist, levator ani, and above the bladder on the anterior wall  Patient confirms identification and approves PT to assess internal pelvic floor and treatment  Yes  PELVIC MMT:   MMT eval 03/04/23  Vaginal 2/5   Internal Anal Sphincter    External Anal Sphincter    Puborectalis    Diastasis Recti    (Blank rows = not tested)        TONE: increased  PROLAPSE: Anterior wall weakness just above the introitus, posterior wall weakness just above the introitus  TODAY'S TREATMENT:  03/04/23 Manual: Soft tissue mobilization: To assess for dry needling Manual work to the left side of the pelvic floor, perineal body, superior transverse, puborectalis, coccygeus, external anal sphincter, and iliococcygeus, obturator internist to elongate after dry needling and palpate for further tenderness Trigger Point Dry-Needling  Treatment instructions: Expect mild to moderate muscle soreness. S/S of pneumothorax if dry needled over a lung field, and to seek immediate medical attention should they occur. Patient verbalized understanding of these  instructions and education.  Patient Consent Given: Yes Education handout provided: Yes Muscles treated: perineal body, left superior transverse, puborectalis, iliococcygeus, coccygeus, obturator internist Electrical stimulation performed: No Parameters: N/A Treatment response/outcome: trigger point response and elongation of muscle     01/28/23 Manual: Soft tissue mobilization:  Manual work to bilateral diaphragm Circular massage to abdomen to improve peristalic motion Myofascial release: Fascial release along the upper abdomen going through the different layers Fascial release along the mesenteric root Fascial release along the lower abdomen going through the restrictions.  Quadruped therapist pulling upward pulling of abdomen as patient rocks back and forth and diagonals Exercises: Stretches/mobility: Cat cow 20 x  Thread the needle 4 x Pigeon pose holding 1 min bil.  Long sit hip adductor stretch holding 1 min V stretch with leaning to one leg holding 30 sec bil.     01/14/23 Manual: Soft tissue mobilization: Patient laying on her side while therapist performs manual work along the obturator internist, perineal body, levator ani, coccygeus to elongate the tissue and work through trigger points Myofascial release: Fascial release around the rectum externally to elongate the tissue Neuromuscular re-education: Down training: Therapist finger at the anus feeling for the downward glide of the pelvic floor muscles as she performs diaphragmatic breathing and and breathing out as she is having a bowel movement  while laying on her side      PATIENT EDUCATION: 12/24/22 Education details: Access Code: [email or web address], information on dry needling Person educated: Patient Education method: Explanation, Demonstration, Tactile cues, Verbal cues, and Handouts Education comprehension: verbalized understanding, returned demonstration, verbal cues required, tactile cues required, and needs  further education    HOME EXERCISE PROGRAM: 12/17/22 Access Code: [email or web address] URL: [email or web address] Date: 12/17/2022 Prepared by: Eulis Foster  Exercises - Supine Piriformis Stretch with Leg Straight  - 1 x daily - 7 x weekly - 1 sets - 2 reps - 30 sec hold - Supine Hamstring Stretch  - 1 x daily - 7 x weekly - 1 sets - 2 reps - 30 sec hold - Happy Baby with Pelvic Floor Lengthening  - 1 x daily - 7 x weekly - 1 sets - 1 reps - 30 sec hold - Cat Cow  - 1 x daily - 7 x weekly - 1 sets - 10 reps - Child's Pose Stretch  - 1 x daily - 7 x weekly - 1 sets - 1 reps - 30 sec hold - Quadruped Full Range Thoracic Rotation with Reach  - 1 x daily - 7 x weekly - 2 reps - 5 sec hold - Prone Press Up  - 1 x daily -  7 x weekly - 1 sets - 10 reps  ASSESSMENT:  CLINICAL IMPRESSION: Patient is a 47 y.o. female who was seen today for physical therapy  treatment for dyspareunia and constipation.  Patient Patient urinates 2 times at night instead of 6 times. She has trigger points in the perineal body, levator ani and obturator internist externally. She was able to bulge her pelvic floor with diaphragmatic breathing. She had trouble keeping her pelvic floor bulged while breathing out to facilitate a bowel movement.  Patient PFIQ-7 score went from 181 to 148.she has improved ROM of her lumbar and increased strength of right hip. She has less pain with penile penetration vaginally. The pain happens with initial penetration and anterior left vaginal canal. She will strain to have a bowel movement if she is not taking her medication and the stool is skinny.  Patient will benefit from skilled therapy to relax her pelvic floor muscles to improve defecation and vaginal penetration.   OBJECTIVE IMPAIRMENTS: decreased coordination, decreased strength, increased fascial restrictions, increased muscle spasms, and pain.   ACTIVITY LIMITATIONS: toileting  PARTICIPATION LIMITATIONS: interpersonal  relationship  PERSONAL FACTORS: 1 comorbidity: Anal fissure; Colitis; Hypothyroidism;  are also affecting patient's functional outcome.   REHAB POTENTIAL: Good  CLINICAL DECISION MAKING: Stable/uncomplicated  EVALUATION COMPLEXITY: Low   GOALS: Goals reviewed with patient? Yes  SHORT TERM GOALS: Target date: 12/30/22  Patient is independent with initial HEP.  Baseline:Not educated yet Goal status: Met 12/24/22   LONG TERM GOALS: Target date: 08/26/23  Patient is independent with advanced HEP for stretches and core strength.  Baseline: not educated yet Goal status: ongoing 01/28/23  2.  Patient is independent with abdominal massage to improve defecation.  Baseline: Not educated yet Goal status: met 12/31/22  3.  Patient is abel to defecate with pain level </= 1/10 and straining reduced >/= 75% due to improved lengthening of the muscles.  Baseline: pain level 6/10 and has to strain.  Goal status: ongoing 01/28/23  4.  Patient is able to have penile penetration vaginally with pain level </= 1-2/10.  Baseline: Pain level 5/10 Goal status: ongoing 01/28/23  5.  Patient is able to fully empty her bladder due to the ability to relax her pelvic floor.  Baseline: Has to sit 2 times Goal status: ongoing 01/28/23    PLAN:  PT FREQUENCY: 1x/week  PT DURATION: 6 months  PLANNED INTERVENTIONS: Therapeutic exercises, Therapeutic activity, Neuromuscular re-education, Patient/Family education, Joint mobilization, Aquatic Therapy, Electrical stimulation, Spinal mobilization, Cryotherapy, Moist heat, Ultrasound, Biofeedback, and Manual therapy  PLAN FOR NEXT SESSION:  manual work to pelvic floor, dry needling to pelvic floor ischiocavernosus, bulbocavernosus, perineal body and levator ani if she is up to it, prone with manual work to the left vaginal wall,  quadruped lift extremity   Eulis Foster, PT 03/20/23 2:14 PM

## 2023-03-26 DIAGNOSIS — F331 Major depressive disorder, recurrent, moderate: Secondary | ICD-10-CM | POA: Diagnosis not present

## 2023-03-27 ENCOUNTER — Encounter: Payer: Medicaid Other | Admitting: Physical Therapy

## 2023-03-27 ENCOUNTER — Encounter: Payer: Self-pay | Admitting: Physical Therapy

## 2023-03-27 DIAGNOSIS — N941 Unspecified dyspareunia: Secondary | ICD-10-CM | POA: Diagnosis not present

## 2023-03-27 DIAGNOSIS — M62838 Other muscle spasm: Secondary | ICD-10-CM

## 2023-03-27 DIAGNOSIS — M6281 Muscle weakness (generalized): Secondary | ICD-10-CM | POA: Diagnosis not present

## 2023-03-27 DIAGNOSIS — R1013 Epigastric pain: Secondary | ICD-10-CM | POA: Diagnosis not present

## 2023-03-27 DIAGNOSIS — K5904 Chronic idiopathic constipation: Secondary | ICD-10-CM | POA: Diagnosis not present

## 2023-03-27 DIAGNOSIS — K920 Hematemesis: Secondary | ICD-10-CM | POA: Diagnosis not present

## 2023-03-27 DIAGNOSIS — R102 Pelvic and perineal pain: Secondary | ICD-10-CM | POA: Diagnosis not present

## 2023-03-27 NOTE — Therapy (Signed)
OUTPATIENT PHYSICAL THERAPY FEMALE PELVIC EVALUATION   Patient Name: Carol Welch MRN: [id number] DOB:[birth year], 47 y.o., female Today's Date: 03/27/2023  END OF SESSION:  PT End of Session - 03/27/23 0939     Visit Number 9    Date for PT Re-Evaluation 08/26/23    Authorization Type [employer]    Authorization Time Period 10/22-11/27/2024    Authorization - Visit Number 3    Authorization - Number of Visits 4    PT Start Time 0930    PT Stop Time 1015    PT Time Calculation (min) 45 min    Activity Tolerance Patient tolerated treatment well    Behavior During Therapy WFL for tasks assessed/performed             Past Medical History:  Diagnosis Date   Anal fissure    Colitis    Depression    Edema, lower extremity    Fatty liver    H. pylori infection    Hypothyroidism    Low blood pressure    Monoallelic mutation of CDKN2A gene    PONV (postoperative nausea and vomiting)    SVD (spontaneous vaginal delivery) 06/30/2015   Vitamin D deficiency    Past Surgical History:  Procedure Laterality Date   BREAST BIOPSY Right 07/09/2022   [place] RT BREAST BX W LOC DEV 1ST LESION IMG BX SPEC US GUIDE 07/09/2022 [facility]   BREAST CYST ASPIRATION Right 02/13/2018   CYSTOSCOPY N/A 12/30/2019   Procedure: CYSTOSCOPY;  Surgeon: Sherian Rein, MD;  Location: [facility];  Service: Gynecology;  Laterality: N/A;   ENDOVENOUS ABLATION SAPHENOUS VEIN W/ LASER Right 10/08/2018   endovenous laser ablation right saphenous vein by Waverly Ferrari MD   LAPAROSCOPIC VAGINAL HYSTERECTOMY WITH SALPINGECTOMY Bilateral 12/30/2019   Procedure: LAPAROSCOPIC ASSISTED VAGINAL HYSTERECTOMY WITH SALPINGECTOMY;  Surgeon: Sherian Rein, MD;  Location: [facility];  Service: Gynecology;  Laterality: Bilateral;   Patient Active Problem List   Diagnosis Date Noted   Pre-diabetes 12/18/2022   Other fatigue 12/18/2022   Worried well 10/09/2022    Obesity, Beginning BMI 30.54 10/09/2022   Genetic testing 10/02/2022   Monoallelic mutation of CDKN2A gene 10/02/2022   Vaginal itching 10/01/2022   H. 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   Neck pain 01/18/2020   S/P laparoscopic assisted vaginal hysterectomy (LAVH) 12/30/2019   External thrombosed hemorrhoids 09/11/2019   MDD (major depressive disorder) 08/24/2019   Orthostatic hypotension 08/24/2019   Hypothyroidism 07/06/2018   Family history of congenital anomaly 08/27/2016    PCP: Tiffany Kocher, DO  REFERRING PROVIDER: Benancio Deeds, MD   REFERRING DIAG:  R10.13 (ICD-10-CM) - Postprandial epigastric pain  K92.0 (ICD-10-CM) - Hematemesis with nausea  K59.04 (ICD-10-CM) - Chronic idiopathic constipation  N94.10 (ICD-10-CM) - Dyspareunia in female    THERAPY DIAG:  Other muscle spasm  Pelvic pain  Rationale for Evaluation and Treatment: Rehabilitation  ONSET DATE: 2022  SUBJECTIVE:  SUBJECTIVE STATEMENT: I am doing better. The exercises have helped my pelvic and back pain. I am doing my exercises. I am going 2-3 times per night and not 5 times. When I sit for a long period of time I still feel my coccyx bone.    PAIN:  Are you having pain? Yes NPRS scale: 4/10 Pain location:  lower abdomen  Pain type: bloating, heaviness, bloating Pain description: intermittent , happens 80% of the time  Aggravating factors: running, not having a bowel movement, gardening  or a lot of lifting Relieving factors: lay down and stroke stomach upward  PRECAUTIONS: None  RED FLAGS: None   WEIGHT BEARING RESTRICTIONS: No  FALLS:  Has patient fallen in last 6 months? No  LIVING ENVIRONMENT: Lives with: lives with their family  OCCUPATION: walking, weights  PLOF: Independent  PATIENT GOALS: reduce discomfort, easier to have a bowel movement  PERTINENT HISTORY:  Anal fissure; Colitis; Hypothyroidism;   BOWEL MOVEMENT: Pain with bowel movement: Yes, intermittent 5/10 without medication Type of bowel movement:Type (Bristol Stool Scale) type 4, Frequency daily, Strain Yes, and Splinting none Fully empty rectum: No Leakage: No Fiber supplement: Yes: Metamucil  URINATION: Pain with urination: No Fully empty bladder: Yes: but at times feels she has more urine left and sit back down to empty bladder further Stream: Strong Urgency: No Frequency: average Leakage:  none Pads: No  INTERCOURSE: Pain with intercourse: Initial Penetration and During Penetration, Patient feels a little spot hurts that is upward to the left Ability to have vaginal penetration:  Yes:   Climax: sometimes, struggle since hysterectectomy Marinoff Scale: 2/3  PREGNANCY: Vaginal deliveries 5, last delivery 03/2018 Tearing did have tearing with the first few and needed stitches, did not have stitches on fourth but had tearing   PROLAPSE: None   OBJECTIVE:   DIAGNOSTIC FINDINGS:  none  PATIENT SURVEYS:  PFIQ-7 133; CRAIQ-7: 71; POPIQ-7 62 12/31/22 PFIQ-7 181; CRAIQ-7: 86; POPIQ-7 95 03/04/23: PFIQ-7 148; CRAIQ-7: 71; POPIQ-7 76   COGNITION: Overall cognitive status: Within functional limits for tasks assessed      POSTURE: No Significant postural limitations  PELVIC ALIGNMENT: ASIS are equal  LUMBARAROM/PROM:  A/PROM A/PROM  eval 12/31/22 03/04/23  Flexion Decreased by 25% full full  Extension Decreased by 50% full full  Right lateral flexion Decreased by  25% Decreased by 25% full  Left lateral flexion full full full  Right rotation Decreased by 25% Decreased by 25% Decreased by 25%  Left rotation Decreased by 25% Decreased by 25% full   (Blank rows = not tested)  LOWER EXTREMITY ROM:  Passive ROM Right eval Left eval Right/Left 03/04/23  Hip external rotation 40 40 45   (Blank rows = not tested)  LOWER EXTREMITY MMT:  MMT Right eval Right  12/31/22 Right 03/04/23  Hip flexion 5/5 5/5 5/5  Hip extension 4/5 4/5 4/5  Hip abduction 3/5 3/5 3+/5  Hip adduction 4/5 3/5 4/5   PALPATION:   General  throughout the abdomen, decreased diaphragm mobility and lower rib cage mobility                External Perineal Exam along the posterior 1/3 of the external rectum; not able to do internal through the rectum due to pain and tightness, tenderness located on bil. Ischiocavernosus and bulbocavernosus                             Internal Pelvic  Floor tenderness located in bilateral obturator internist, levator ani, and above the bladder on the anterior wall  Patient confirms identification and approves PT to assess internal pelvic floor and treatment Yes  PELVIC MMT:   MMT eval 03/04/23  Vaginal 2/5 2/5  (Blank rows = not tested)        TONE: increased  PROLAPSE: Anterior wall weakness just above the introitus, posterior wall weakness just above the introitus  TODAY'S TREATMENT:  03/27/23 Manual: Internal pelvic floor techniques: No emotional/communication barriers or cognitive limitation. Patient is motivated to learn. Patient understands and agrees with treatment goals and plan. PT explains patient will be examined in standing, sitting, and lying down to see how their muscles and joints work. When they are ready, they will be asked to remove their underwear so PT can examine their perineum. The patient is also given the option of providing their own chaperone as one is not provided in our facility. The patient also has the right  and is explained the right to defer or refuse any part of the evaluation or treatment including the internal exam. With the patient's consent, PT will use one gloved finger to gently assess the muscles of the pelvic floor, seeing how well it contracts and relaxes and if there is muscle symmetry. After, the patient will get dressed and PT and patient will discuss exam findings and plan of care. PT and patient discuss plan of care, schedule, attendance policy and HEP activities.  Manual work externally releasing the ischiocavernosus, bulbocavernosus, around the clitoris, and perineal body monitoring for pain and relaxation of the tissue   03/20/23 Manual: Soft tissue mobilization: To assess for dry needling Manual work to the right side of the pelvic floor, perineal body, superior transverse, puborectalis, coccygeus, external anal sphincter, and iliococcygeus, obturator internist to elongate after dry needling and palpate for further tenderness Trigger Point Dry-Needling  Treatment instructions: Expect mild to moderate muscle soreness. S/S of pneumothorax if dry needled over a lung field, and to seek immediate medical attention should they occur. Patient verbalized understanding of these instructions and education.  Patient Consent Given: Yes Education handout provided: Yes Muscles treated: perineal body, left superior transverse, puborectalis, iliococcygeus, coccygeus, obturator internist Electrical stimulation performed: No Parameters: N/A Treatment response/outcome: trigger point response and elongation of muscle Exercises: Stretches/mobility: Cat cow 20 x  Childs pose holding 30 sec all 3 ways Pigeon pose holding 30 sec bil. V stretch to elongate the hip adductors holding 30 sec    03/04/23 Manual: Soft tissue mobilization: To assess for dry needling Manual work to the left side of the pelvic floor, perineal body, superior transverse, puborectalis, coccygeus, external anal sphincter,  and iliococcygeus, obturator internist to elongate after dry needling and palpate for further tenderness Trigger Point Dry-Needling  Treatment instructions: Expect mild to moderate muscle soreness. S/S of pneumothorax if dry needled over a lung field, and to seek immediate medical attention should they occur. Patient verbalized understanding of these instructions and education.  Patient Consent Given: Yes Education handout provided: Yes Muscles treated: perineal body, left superior transverse, puborectalis, iliococcygeus, coccygeus, obturator internist Electrical stimulation performed: No Parameters: N/A Treatment response/outcome: trigger point response and elongation of muscle     PATIENT EDUCATION: 12/24/22 Education details: Access Code: [email or web address], information on dry needling Person educated: Patient Education method: Explanation, Demonstration, Tactile cues, Verbal cues, and Handouts Education comprehension: verbalized understanding, returned demonstration, verbal cues required, tactile cues required, and needs further education    HOME EXERCISE PROGRAM: 12/17/22  Access Code: [email or web address] URL: [email or web address] Date: 12/17/2022 Prepared by: Eulis Foster  Exercises - Supine Piriformis Stretch with Leg Straight  - 1 x daily - 7 x weekly - 1 sets - 2 reps - 30 sec hold - Supine Hamstring Stretch  - 1 x daily - 7 x weekly - 1 sets - 2 reps - 30 sec hold - Happy Baby with Pelvic Floor Lengthening  - 1 x daily - 7 x weekly - 1 sets - 1 reps - 30 sec hold - Cat Cow  - 1 x daily - 7 x weekly - 1 sets - 10 reps - Child's Pose Stretch  - 1 x daily - 7 x weekly - 1 sets - 1 reps - 30 sec hold - Quadruped Full Range Thoracic Rotation with Reach  - 1 x daily - 7 x weekly - 2 reps - 5 sec hold - Prone Press Up  - 1 x daily - 7 x weekly - 1 sets - 10 reps  ASSESSMENT:  CLINICAL IMPRESSION: Patient is a 47 y.o. female who was seen today for physical therapy  treatment for  dyspareunia and constipation.  Patient urinates 2 times at night instead of 6 times. She has trigger points in the perineal body, levator ani and obturator internist externally. She was able to bulge her pelvic floor with diaphragmatic breathing. She had trouble keeping her pelvic floor bulged while breathing out to facilitate a bowel movement.  Patient PFIQ-7 score went from 181 to 148.she has improved ROM of her lumbar and increased strength of right hip. She has less pain with penile penetration vaginally but mentally is scared due of pain.  The pain happens with initial penetration and anterior left vaginal canal. She will strain 60% less to have a bowel movement. Patient will be talking to someone about her pain with intercourse. Stool is still skinny.  Pain with bowel movements is 4/10. Patient will benefit from skilled therapy to relax her pelvic floor muscles to improve defecation and vaginal penetration.   OBJECTIVE IMPAIRMENTS: decreased coordination, decreased strength, increased fascial restrictions, increased muscle spasms, and pain.   ACTIVITY LIMITATIONS: toileting  PARTICIPATION LIMITATIONS: interpersonal relationship  PERSONAL FACTORS: 1 comorbidity: Anal fissure; Colitis; Hypothyroidism;  are also affecting patient's functional outcome.   REHAB POTENTIAL: Good  CLINICAL DECISION MAKING: Stable/uncomplicated  EVALUATION COMPLEXITY: Low   GOALS: Goals reviewed with patient? Yes  SHORT TERM GOALS: Target date: 12/30/22  Patient is independent with initial HEP.  Baseline:Not educated yet Goal status: Met 12/24/22   LONG TERM GOALS: Target date: 08/26/23  Patient is independent with advanced HEP for stretches and core strength.  Baseline: not educated yet Goal status: ongoing 03/27/23  2.  Patient is independent with abdominal massage to improve defecation.  Baseline: Not educated yet Goal status: met 12/31/22  3.  Patient is abel to defecate with pain level </= 1/10 and  straining reduced >/= 75% due to improved lengthening of the muscles.  Baseline: pain level 4/10 and has to strain. Strains 60% less Goal status: ongoing 01/28/23  4.  Patient is able to have penile penetration vaginally with pain level </= 1-2/10.  Baseline: Pain level 5/10 Goal status: ongoing 03/27/23  5.  Patient is able to fully empty her bladder due to the ability to relax her pelvic floor.  Baseline: Has to sit 2 times Goal status: ongoing 03/27/23    PLAN:  PT FREQUENCY: 1x/week  PT DURATION: 6 months  PLANNED INTERVENTIONS: Therapeutic exercises, Therapeutic  activity, Neuromuscular re-education, Patient/Family education, Joint mobilization, Aquatic Therapy, Electrical stimulation, Spinal mobilization, Cryotherapy, Moist heat, Ultrasound, Biofeedback, and Manual therapy  PLAN FOR NEXT SESSION:  manual work to pelvic floor,, prone with manual work to the left vaginal wall,  quadruped lift extremity   Eulis Foster, PT 03/27/23 10:20 AM

## 2023-04-02 DIAGNOSIS — F331 Major depressive disorder, recurrent, moderate: Secondary | ICD-10-CM | POA: Diagnosis not present

## 2023-04-06 DIAGNOSIS — Z419 Encounter for procedure for purposes other than remedying health state, unspecified: Secondary | ICD-10-CM | POA: Diagnosis not present

## 2023-04-10 ENCOUNTER — Telehealth: Payer: Self-pay | Admitting: *Deleted

## 2023-04-10 NOTE — Telephone Encounter (Signed)
Left message for patient to call back  

## 2023-04-10 NOTE — Telephone Encounter (Signed)
-----   Message from Nurse Deno Etienne sent at 03/12/2023  9:41 AM EST ----- See 03/05/23 phone note: pt needs repeat H Pylori diatherix testing

## 2023-04-11 NOTE — Telephone Encounter (Signed)
Patient reminded to come for h pylori diatherix test kit (already placed at front desk). She does confirm that she has been holding PPI x 2 weeks and will come to pick up stool kit.

## 2023-04-14 ENCOUNTER — Ambulatory Visit (INDEPENDENT_AMBULATORY_CARE_PROVIDER_SITE_OTHER): Payer: Medicaid Other | Admitting: Family Medicine

## 2023-04-16 DIAGNOSIS — F331 Major depressive disorder, recurrent, moderate: Secondary | ICD-10-CM | POA: Diagnosis not present

## 2023-04-18 DIAGNOSIS — A048 Other specified bacterial intestinal infections: Secondary | ICD-10-CM | POA: Diagnosis not present

## 2023-04-24 DIAGNOSIS — F331 Major depressive disorder, recurrent, moderate: Secondary | ICD-10-CM | POA: Diagnosis not present

## 2023-04-28 ENCOUNTER — Telehealth: Payer: Self-pay | Admitting: Gastroenterology

## 2023-04-28 DIAGNOSIS — F331 Major depressive disorder, recurrent, moderate: Secondary | ICD-10-CM | POA: Diagnosis not present

## 2023-04-28 NOTE — Telephone Encounter (Signed)
H pylori testing returned - she is POSITIVE for clarithromycin resistant H pylori on stool test 04/18/23:   She has failed the following regimens: 14-day Amoxicillin 1gm BID Clarithromycin 500mg  BID Flagyl 500mg  BID Continue protonix 40mg  BID  AND    Protonix 40mg  BID Pepto Bismol 2 tabs (262 mg each) 4 times a day x 14 d  Metronidazole 250 mg 4 times a day x 14 d  doxycycline 100 mg 2 times a day x 14 d     Given this persistent infection recommend the following for salvage: Talicia - 1 cap TID for 14 days.   Given she has failed 2 options as above I am hoping she can get approved for this. Can you let me know? Thanks

## 2023-04-29 ENCOUNTER — Encounter: Payer: Medicaid Other | Admitting: Physical Therapy

## 2023-05-01 ENCOUNTER — Encounter: Payer: Medicaid Other | Attending: Physician Assistant | Admitting: Physical Therapy

## 2023-05-01 ENCOUNTER — Telehealth: Payer: Self-pay | Admitting: Physical Therapy

## 2023-05-01 DIAGNOSIS — R102 Pelvic and perineal pain: Secondary | ICD-10-CM | POA: Insufficient documentation

## 2023-05-01 DIAGNOSIS — N941 Unspecified dyspareunia: Secondary | ICD-10-CM | POA: Insufficient documentation

## 2023-05-01 DIAGNOSIS — K5904 Chronic idiopathic constipation: Secondary | ICD-10-CM | POA: Insufficient documentation

## 2023-05-01 DIAGNOSIS — K920 Hematemesis: Secondary | ICD-10-CM | POA: Insufficient documentation

## 2023-05-01 DIAGNOSIS — M6281 Muscle weakness (generalized): Secondary | ICD-10-CM | POA: Insufficient documentation

## 2023-05-01 DIAGNOSIS — M62838 Other muscle spasm: Secondary | ICD-10-CM | POA: Insufficient documentation

## 2023-05-01 DIAGNOSIS — R1013 Epigastric pain: Secondary | ICD-10-CM | POA: Insufficient documentation

## 2023-05-01 NOTE — Telephone Encounter (Signed)
Called patient about her missed appointment today at 8:30. Left a message.  Eulis Foster, PT @12 /26/24@ 9:00 AM

## 2023-05-02 ENCOUNTER — Telehealth: Payer: Self-pay | Admitting: Gastroenterology

## 2023-05-02 MED ORDER — TALICIA 250-12.5-10 MG PO CPDR: 1.0000 | DELAYED_RELEASE_CAPSULE | Freq: Three times a day (TID) | ORAL | 0 refills | Status: DC

## 2023-05-02 NOTE — Telephone Encounter (Signed)
Patient advised of continued positive H Pylori testing. Advised that we have sent another prescription to pharmacy for Talicia as well as a note to our prior authorization team to attempt to get approval through insurance now that she has tried and failed to 2 H Pylori regimens. Patient verbalizes understanding.

## 2023-05-02 NOTE — Addendum Note (Signed)
Addended by: Richardson Chiquito on: 05/02/2023 12:32 PM   Modules accepted: Orders

## 2023-05-02 NOTE — Telephone Encounter (Signed)
PT returning call to discuss PA. Please advise.

## 2023-05-02 NOTE — Telephone Encounter (Addendum)
Carol Welch sent to pharmacy. I have left a message for patient to call back.  [facility] & PA team- Can we see if insurance will approve [name] now that patient has tried and failed 2 different breakdowns of medication for H Pylori eradication?  Thank you!

## 2023-05-02 NOTE — Telephone Encounter (Signed)
See phone note dated 04/28/23 for additional information.

## 2023-05-06 ENCOUNTER — Telehealth: Payer: Self-pay | Admitting: Pharmacy Technician

## 2023-05-06 NOTE — Telephone Encounter (Signed)
 Pharmacy Patient Advocate Encounter   Received notification from Physician's Office that prior authorization for Talicia  is required/requested.   Insurance verification completed.   The patient is insured through [facility] [place] .   Per test claim: PA required; PA submitted to above mentioned insurance via CoverMyMeds Key/confirmation #/EOC Key: [id number] Status is pending

## 2023-05-06 NOTE — Telephone Encounter (Signed)
PA request has been Started. New Encounter created for follow up. For additional info see Pharmacy Prior Auth telephone encounter from 05/06/23.

## 2023-05-06 NOTE — Telephone Encounter (Signed)
Hi! Starting PA. Can we get a copy of 04/18/23 H Pylori testing? Unable to locate in the chart.

## 2023-05-07 DIAGNOSIS — Z419 Encounter for procedure for purposes other than remedying health state, unspecified: Secondary | ICD-10-CM | POA: Diagnosis not present

## 2023-05-08 ENCOUNTER — Other Ambulatory Visit (HOSPITAL_COMMUNITY)
Admission: RE | Admit: 2023-05-08 | Discharge: 2023-05-08 | Disposition: A | Discharge status: Home / Self Care | Payer: Medicaid Other | Source: Ambulatory Visit | Attending: Family Medicine | Admitting: Family Medicine

## 2023-05-08 ENCOUNTER — Ambulatory Visit (INDEPENDENT_AMBULATORY_CARE_PROVIDER_SITE_OTHER): Payer: Medicaid Other | Admitting: Student

## 2023-05-08 VITALS — BP 106/57 | HR 71 | Ht 62.0 in | Wt 152.8 lb

## 2023-05-08 DIAGNOSIS — N898 Other specified noninflammatory disorders of vagina: Secondary | ICD-10-CM | POA: Diagnosis not present

## 2023-05-08 DIAGNOSIS — F331 Major depressive disorder, recurrent, moderate: Secondary | ICD-10-CM

## 2023-05-08 DIAGNOSIS — E039 Hypothyroidism, unspecified: Secondary | ICD-10-CM

## 2023-05-08 MED ORDER — ESCITALOPRAM OXALATE 20 MG PO TABS: 20.0000 mg | ORAL_TABLET | Freq: Every day | ORAL | 2 refills | Status: AC

## 2023-05-08 MED ORDER — LEVOTHYROXINE SODIUM 75 MCG PO TABS: ORAL_TABLET | ORAL | 3 refills | Status: DC

## 2023-05-08 MED ORDER — FLUCONAZOLE 150 MG PO TABS: 150.0000 mg | ORAL_TABLET | Freq: Once | ORAL | 0 refills | Status: AC

## 2023-05-08 NOTE — Progress Notes (Addendum)
    SUBJECTIVE:   CHIEF COMPLAINT / HPI:   Vaginal Discharge: Patient is a 48 y.o. female presenting with vaginal discharge for 5 days.  She states the discharge is of  white, clumpy consistency.  She is  interested in screening for sexually transmitted infections today. She has contraception with   hysterectomy .   Depression: States she is feeling upset and having thoughts of being better off dead, but denies any plans to harm themselves. They attribute these feelings to the numerous health issues they are currently facing, including a newly discovered breast lump, ongoing H. Pylori infection, and potential high risk for stomach cancer. They also mention issues with insurance coverage for their H. Pylori medication.      05/08/2023    2:42 PM 10/01/2022    9:06 AM 07/08/2022    3:39 PM 03/22/2022    9:29 AM 03/07/2022    1:30 PM  Depression screen PHQ 2/9  Decreased Interest 1 1 1  0 1  Down, Depressed, Hopeless 2 1 0 1 1  PHQ - 2 Score 3 2 1 1 2   Altered sleeping 3 2 0 2 1  Tired, decreased energy 2 2 1 2 3   Change in appetite 2 1 0 1 0  Feeling bad or failure about yourself  1 0 0 0 0  Trouble concentrating 1 1 0 1 2  Moving slowly or fidgety/restless 1 1 0 0 1  Suicidal thoughts 2 0 0 0 0  PHQ-9 Score 15 9 2 7 9   Difficult doing work/chores  Somewhat difficult        PERTINENT  PMH / PSH: None relevant  OBJECTIVE:   BP (!) 106/57   Pulse 71   Ht 5' 2 (1.575 m)   Wt 152 lb 12.8 oz (69.3 kg)   LMP 12/19/2019   SpO2 100%   BMI 27.95 kg/m    General: NAD, pleasant, able to participate in exam Respiratory: Normal effort, no obvious respiratory distress Pelvic: VULVA: normal appearing vulva with no masses, tenderness or lesions, VAGINA: Normal appearing vagina with normal color, no lesions, with white discharge present  Chaperone Harlene Reiter CMA present for pelvic exam  ASSESSMENT/PLAN:   Assessment & Plan Vaginal discharge 48 y.o. female with vaginal discharge  for 5 days, as well as itching and white discharge. Patient is interested in STI screening.   Plan: -Cervicovaginal swab (wet prep could not be performed today) Yeast, BV, G/C, Trich -HIV, RPR -Follow-up as needed Moderate episode of recurrent major depressive disorder (HCC) PHQ-9 with suicidal ideation.  Passive ideation during history. Empathetic listening provided during encounter. Safety contracts with me. Protective factors of kids.  -Refilled Lexapro  20 mg -2-week follow-up with PCP -AVS suicide precautions provided -Therapy appointment later this afternoon scheduled  Wendel Lesch, MD [facility]

## 2023-05-08 NOTE — Telephone Encounter (Signed)
 Okay thanks very much for your help with this

## 2023-05-08 NOTE — Telephone Encounter (Signed)
 This is frustrating to hear. The patient has ALREADY FAILED PYLERA - she took the individual components that make up Pylera (She has Protonix  40mg  BID, Pepto Bismol 2 tabs (262 mg each) 4 times a day x 14 d , Metronidazole  250 mg 4 times a day x 14 d , doxycycline  100 mg 2 times a day x 14 d)  Can we appeal this decision? They need to know she has already failed Pylera. Thanks

## 2023-05-08 NOTE — Telephone Encounter (Signed)
 Inbound call from patient requesting a call regarding update of prior auth. Please advise, thank you.

## 2023-05-08 NOTE — Telephone Encounter (Signed)
 Pharmacy Patient Advocate Encounter  Received notification from [facility] Medicaid that Prior Authorization for Talicia  has been DENIED.  Full denial letter will be uploaded to the media tab. See denial reason below.  Plan requires patient to trial a plan preferred agent: Pylera Capsule  PA #/Case ID/Reference #: [id number]  Please advise how you would like to proceed?

## 2023-05-08 NOTE — Patient Instructions (Addendum)
 It was great to see you! Thank you for allowing me to participate in your care!   Our plans for today:  - I will let you know what your swab shows  Take care and seek immediate care sooner if you develop any concerns.  Wendel Lesch, MD   If you are feeling suicidal or depression symptoms worsen please immediately go to:   If you are thinking about harming yourself or having thoughts of suicide, or if you know someone who is, seek help right away. If you are in crisis, make sure you are not left alone.  If someone else is in crisis, make sure he/she/they is not left alone  Call 988 OR 1-800-273-TALK  24 Hour Availability for Walk-IN services  [facility]  [address] [place], [place] [phone] Crisis [phone]    Other crisis resources:  [facility] [employer] (Domestic Violence, Rape & Victim Assistance [phone]  [facility] [employer] [facility]    (ONLY from 8am-4pm)    [phone]  [facility] Mobile Crisis Unit (24/7)   [phone]  [place]  National Suicide Hotline   9191114975 MERRILYN)

## 2023-05-08 NOTE — Assessment & Plan Note (Addendum)
 PHQ-9 with suicidal ideation.  Passive ideation during history. Empathetic listening provided during encounter. Safety contracts with me. Protective factors of kids.  -Refilled Lexapro  20 mg -2-week follow-up with PCP -AVS suicide precautions provided -Therapy appointment later this afternoon scheduled

## 2023-05-09 DIAGNOSIS — F331 Major depressive disorder, recurrent, moderate: Secondary | ICD-10-CM | POA: Diagnosis not present

## 2023-05-09 LAB — HIV ANTIBODY (ROUTINE TESTING W REFLEX): HIV Screen 4th Generation wRfx: NONREACTIVE

## 2023-05-09 LAB — HCV AB W REFLEX TO QUANT PCR: HCV Ab: NONREACTIVE

## 2023-05-09 LAB — HCV INTERPRETATION

## 2023-05-09 LAB — RPR: RPR Ser Ql: NONREACTIVE

## 2023-05-12 LAB — CERVICOVAGINAL ANCILLARY ONLY
Bacterial Vaginitis (gardnerella): NEGATIVE
Candida Glabrata: NEGATIVE
Candida Vaginitis: NEGATIVE
Chlamydia: NEGATIVE
Comment: NEGATIVE
Comment: NEGATIVE
Comment: NEGATIVE
Comment: NEGATIVE
Comment: NEGATIVE
Comment: NORMAL
Neisseria Gonorrhea: NEGATIVE
Trichomonas: NEGATIVE

## 2023-05-12 NOTE — Telephone Encounter (Signed)
 Thanks for your help. Yes in this case I think best to appeal it. I appreciate your review, there is a lot of overlap with the regimen of what I gave her and Pylera, I would think Pylera would be unlikely to provide any significant benefit compared to what she already had. She has failed 2 regimens for H pylori. Thanks

## 2023-05-12 NOTE — Telephone Encounter (Signed)
 Carol Welch was treated with a regimen including bismuth  subcitrate potassium, metronidazole , and doxycycline  in place of tetracycline . Pylera typically contains bismuth  subcitrate potassium, metronidazole , and tetracycline . While doxycycline , has some supporting data as an alternative, tetracycline  remains preferred due to more robust evidence of efficacy. Some clinical guidelines recognize doxycycline  as a potential substitute when tetracycline  is unavailable or contraindicated. The preferred alternative for her insurance is Pylera Capsules. Given the use of doxycycline , would you like to proceed with an appeal to the insurance company, emphasizing doxycycline 's acceptability based on the available clinical guidance and literature? Please advise at your earliest convenience.  Thank you, Devere Pandy, PharmD Clinical Pharmacist  [place]  Direct Dial: [phone]

## 2023-05-13 ENCOUNTER — Encounter: Payer: Medicaid Other | Attending: Physician Assistant | Admitting: Physical Therapy

## 2023-05-13 MED ORDER — BIS SUBCIT-METRONID-TETRACYC 140-125-125 MG PO CAPS: 3.0000 | ORAL_CAPSULE | Freq: Three times a day (TID) | ORAL | 0 refills | Status: DC

## 2023-05-13 MED ORDER — OMEPRAZOLE 20 MG PO CPDR: 20.0000 mg | DELAYED_RELEASE_CAPSULE | Freq: Two times a day (BID) | ORAL | 0 refills | Status: DC

## 2023-05-13 NOTE — Telephone Encounter (Signed)
 Prescriptions sent for Pylera and omeprazole  20 mg for 14 days.  Called and spoke to patient.  She understands how to take the prescriptions and to continue Protonix  BID.  We will do a stool test in 6 weeks which will be one month after she does the 14 course. Reminder placed for Feb. 19th

## 2023-05-13 NOTE — Telephone Encounter (Signed)
 After investigating further, I confirmed with [place] [employer] that they require a trial with brand Pylera before approving Talicia , despite the patient having already taken the separate components. In an effort to avoid any additional delays in the patient's care, we wanted to bring this to your attention.  Please advise!  Thanks, Devere Pandy, PharmD Clinical Pharmacist  [place]  Direct Dial: [phone]

## 2023-05-13 NOTE — Telephone Encounter (Signed)
 Okay, I appreciate your help looking into this.  Given this issue, Jan can you let the patient know we can go ahead with Pylera ( Three capsules (bismuth  subcitrate 420 mg, metronidazole  375 mg, and tetracycline  375 mg) 4 times daily after meals and at bedtime (plus omeprazole  20 mg twice daily) for 14 days).   She should take her Protonix  40 mg twice daily with this regimen.  Recommend once she has completed Pylera, repeat H. pylori stool antigen roughly 1 month after she has completed the course, using Diatherix swab.  Jan can you order for her?  Thanks

## 2023-05-13 NOTE — Telephone Encounter (Signed)
 Patient requesting f/u call to discuss previous notes on PA. Please advise.

## 2023-05-14 DIAGNOSIS — F331 Major depressive disorder, recurrent, moderate: Secondary | ICD-10-CM | POA: Diagnosis not present

## 2023-05-16 ENCOUNTER — Other Ambulatory Visit: Payer: Self-pay | Admitting: Gastroenterology

## 2023-05-19 ENCOUNTER — Ambulatory Visit (INDEPENDENT_AMBULATORY_CARE_PROVIDER_SITE_OTHER): Payer: Medicaid Other | Admitting: Student

## 2023-05-19 VITALS — BP 112/63 | HR 74 | Wt 156.4 lb

## 2023-05-19 DIAGNOSIS — F331 Major depressive disorder, recurrent, moderate: Secondary | ICD-10-CM

## 2023-05-19 DIAGNOSIS — A048 Other specified bacterial intestinal infections: Secondary | ICD-10-CM | POA: Diagnosis not present

## 2023-05-19 NOTE — Progress Notes (Signed)
    SUBJECTIVE:   CHIEF COMPLAINT / HPI:   Depression Patient has follow-up appointment for depression.  Her last visit, she was taking her Lexapro  20 mg and has been self medication for several weeks.  She had increased help anxiety related to her refractory H. pylori.  She was started on 20 mg Lexapro .  Today, she reports significant treatment of her depression.  She has no suicidal or homicidal ideations.  H. pylori Patient is followed with GI for H. pylori.  H. pylori biopsy showed refractory to clarithromycin .  Unfortunately, Medicaid would not cover Telicia, and required failure of brand-name Pylera before approving.  Unfortunately, I have been told by several pharmacies in [place] that brand-name Pylera is no longer in production.  Medicaid has not updated the formulary to include generic Pylera.  Question if GI can push back on prior authorization given patient cannot obtain brand-name Pylera.  In the past, I have given the component separately-however, Medicaid may not cover this either.  Bump on head Patient reports a bump on her posterior right occiput, that is now resolved.  It was present for several days, and pruritic.  OBJECTIVE:   Vitals:   05/19/23 1502  BP: 112/63  Pulse: 74  SpO2: 99%   General: NAD, pleasant, Cardio: RRR, no MRG. Cap Refill <2s. Respiratory: CTAB, normal wob on RA Skin: Warm and dry.  No lesion on scalp identified.   ASSESSMENT/PLAN:   Assessment & Plan Moderate episode of recurrent major depressive disorder (HCC) Improved.  Compliant with 20 mg Lexapro .  Discussed return precautions. - Continue Lexapro  20 mg daily - Follow-up as needed H. pylori infection Will defer to GI at this time.  Question if Talicia  can be covered by Medicaid now that brand-name Pylera is not obtainable.   Gladis Church, DO [facility]

## 2023-05-19 NOTE — Assessment & Plan Note (Signed)
 Improved.  Compliant with 20 mg Lexapro.  Discussed return precautions. - Continue Lexapro 20 mg daily - Follow-up as needed

## 2023-05-19 NOTE — Patient Instructions (Signed)
 It was great to see you! Thank you for allowing me to participate in your care!   I recommend that you always bring your medications to each appointment as this makes it easy to ensure we are on the correct medications and helps us  not miss when refills are needed.  Our plans for today:  - I recommend waiting for Dr. Hassan office to contact you regarding H. Pylori treatment. -Continue taking Lexapro  20 mg daily  Take care and seek immediate care sooner if you develop any concerns. Please remember to show up 15 minutes before your scheduled appointment time!  Gladis Church, DO [facility]

## 2023-05-19 NOTE — Assessment & Plan Note (Signed)
 Will defer to GI at this time.  Question if [name] can be covered by Medicaid now that brand-name Pylera is not obtainable.

## 2023-05-20 ENCOUNTER — Encounter: Payer: Self-pay | Admitting: Physical Therapy

## 2023-05-20 ENCOUNTER — Encounter: Payer: Medicaid Other | Attending: Physician Assistant | Admitting: Physical Therapy

## 2023-05-20 DIAGNOSIS — M6281 Muscle weakness (generalized): Secondary | ICD-10-CM | POA: Insufficient documentation

## 2023-05-20 DIAGNOSIS — R102 Pelvic and perineal pain: Secondary | ICD-10-CM | POA: Insufficient documentation

## 2023-05-20 DIAGNOSIS — M62838 Other muscle spasm: Secondary | ICD-10-CM | POA: Insufficient documentation

## 2023-05-20 NOTE — Therapy (Signed)
 OUTPATIENT PHYSICAL THERAPY FEMALE PELVIC EVALUATION   Patient Name: Carol Welch MRN: [id number] DOB:January 03, 1976, 48 y.o., female Today's Date: 05/20/2023  END OF SESSION:  PT End of Session - 05/20/23 0833     Visit Number 10    Date for PT Re-Evaluation 08/26/23    Authorization Type [employer]    Authorization Time Period 06/01/23    Authorization - Visit Number 1    Authorization - Number of Visits 2    PT Start Time 0830    PT Stop Time 0925    PT Time Calculation (min) 55 min    Activity Tolerance Patient tolerated treatment well    Behavior During Therapy [facility] for tasks assessed/performed             Past Medical History:  Diagnosis Date   Anal fissure    Colitis    Depression    Edema, lower extremity    Fatty liver    H. pylori infection    Hypothyroidism    Low blood pressure    Monoallelic mutation of CDKN2A gene    PONV (postoperative nausea and vomiting)    SVD (spontaneous vaginal delivery) 06/30/2015   Vitamin D  deficiency    Past Surgical History:  Procedure Laterality Date   BREAST BIOPSY Right 07/09/2022   US  RT BREAST BX W LOC DEV 1ST LESION IMG BX SPEC US  GUIDE 07/09/2022 [facility]MO[facility]   BREAST CYST ASPIRATION Right 02/13/2018   CYSTOSCOPY N/A 12/30/2019   Procedure: CYSTOSCOPY;  Surgeon: Danielle Rom, MD;  Location: [facility];  Service: Gynecology;  Laterality: N/A;   ENDOVENOUS ABLATION SAPHENOUS VEIN W/ LASER Right 10/08/2018   endovenous laser ablation right saphenous vein by Lonni Blade MD   LAPAROSCOPIC VAGINAL HYSTERECTOMY WITH SALPINGECTOMY Bilateral 12/30/2019   Procedure: LAPAROSCOPIC ASSISTED VAGINAL HYSTERECTOMY WITH SALPINGECTOMY;  Surgeon: Danielle Rom, MD;  Location: [facility];  Service: Gynecology;  Laterality: Bilateral;   Patient Active Problem List   Diagnosis Date Noted   Pre-diabetes 12/18/2022   Other fatigue 12/18/2022   Worried well 10/09/2022   Obesity,  Beginning BMI 30.54 10/09/2022   Genetic testing 10/02/2022   Monoallelic mutation of CDKN2A gene 10/02/2022   Vaginal itching 10/01/2022   H. 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   Neck pain 01/18/2020   S/P laparoscopic assisted vaginal hysterectomy (LAVH) 12/30/2019   External thrombosed hemorrhoids 09/11/2019   MDD (major depressive disorder) 08/24/2019   Orthostatic hypotension 08/24/2019   Hypothyroidism 07/06/2018   Family history of congenital anomaly 08/27/2016    PCP: Howell Lunger, DO  REFERRING PROVIDER: Leigh Elspeth SQUIBB, MD   REFERRING DIAG:  R10.13 (ICD-10-CM) - Postprandial epigastric pain  K92.0 (ICD-10-CM) - Hematemesis with nausea  K59.04 (ICD-10-CM) - Chronic idiopathic constipation  N94.10 (ICD-10-CM) - Dyspareunia in female    THERAPY DIAG:  Other muscle spasm  Pelvic pain  Muscle weakness (generalized)  Rationale for Evaluation and Treatment: Rehabilitation  ONSET DATE: 2022  SUBJECTIVE:  SUBJECTIVE STATEMENT:  I had a cold last week. I had a yeast infection. I have been doing the stretches and exercises that have been helping. The abdominal massage helps. I get up 2 times per night now. I can sleep better during the night. I shower with warm water for the back and lower abdominal pain and it is better.     PAIN:  Are you having pain? Yes NPRS scale: 3/10 Pain location:  lower abdomen  Pain type: bloating, heaviness, bloating Pain  description: intermittent , happens 80% of the time  Aggravating factors: running, not having a bowel movement, gardening or a lot of lifting Relieving factors: lay down and stroke stomach upward  PRECAUTIONS: None  RED FLAGS: None   WEIGHT BEARING RESTRICTIONS: No  FALLS:  Has patient fallen in last 6 months? No  LIVING ENVIRONMENT: Lives with: lives with their family  OCCUPATION: walking, weights  PLOF: Independent  PATIENT GOALS: reduce discomfort, easier to have a bowel movement  PERTINENT HISTORY:  Anal fissure; Colitis; Hypothyroidism;   BOWEL MOVEMENT: Pain with bowel movement: no Type of bowel movement:Type (Bristol Stool Scale) type 4, Frequency daily, Strain no, and Splinting none Fully empty rectum: No Leakage: No Fiber supplement: Yes: Metamucil  URINATION: Pain with urination: No Fully empty bladder: Yes: but at times feels she has more urine left and sit back down to empty bladder further Stream: Strong Urgency: No Frequency: average Leakage:  none Pads: No  INTERCOURSE: Pain with intercourse: Initial Penetration and During Penetration, Patient feels a little spot hurts that is upward to the left Ability to have vaginal penetration:  Yes:   Climax: sometimes, struggle since hysterectectomy Marinoff Scale: 2/3  PREGNANCY: Vaginal deliveries 5, last delivery 03/2018 Tearing did have tearing with the first few and needed stitches, did not have stitches on fourth but had tearing   PROLAPSE: None   OBJECTIVE:   DIAGNOSTIC FINDINGS:  none  PATIENT SURVEYS:  PFIQ-7 133; CRAIQ-7: 71; POPIQ-7 62 12/31/22 PFIQ-7 181; CRAIQ-7: 86; POPIQ-7 95 03/04/23: PFIQ-7 148; CRAIQ-7: 71; POPIQ-7 76 05/20/23 PFIQ-7 33; CRAIQ-7: 14; POPIQ-7 19   COGNITION: Overall cognitive status: Within functional limits for tasks assessed      POSTURE: No Significant postural limitations  PELVIC ALIGNMENT: ASIS are equal  LUMBARAROM/PROM:  A/PROM A/PROM  eval  12/31/22 03/04/23  Flexion Decreased by 25% full full  Extension Decreased by 50% full full  Right lateral flexion Decreased by 25% Decreased by 25% full  Left lateral flexion full full full  Right rotation Decreased by 25% Decreased by 25% Decreased by 25%  Left rotation Decreased by 25% Decreased by 25% full   (Blank rows = not tested)  LOWER EXTREMITY ROM:  Passive ROM Right eval Left eval Right/Left 03/04/23  Hip external rotation 40 40 45   (Blank rows = not tested)  LOWER EXTREMITY MMT:  MMT Right eval Right  12/31/22 Right 03/04/23 Right  05/20/23  Hip flexion 5/5 5/5 5/5 5/5  Hip extension 4/5 4/5 4/5 4+/5  Hip abduction 3/5 3/5 3+/5 4/5  Hip adduction 4/5 3/5 4/5 4+/5   PALPATION:   General  throughout the abdomen, decreased diaphragm mobility and lower rib cage mobility                External Perineal Exam along the posterior 1/3 of the external rectum; not able to do internal through the rectum due to pain and tightness, tenderness located on bil. Ischiocavernosus and bulbocavernosus  Internal Pelvic Floor tenderness located in bilateral obturator internist, levator ani, and above the bladder on the anterior wall  Patient confirms identification and approves PT to assess internal pelvic floor and treatment Yes  PELVIC MMT:   MMT eval 03/04/23  Vaginal 2/5 2/5  (Blank rows = not tested)        TONE: increased  PROLAPSE: Anterior wall weakness just above the introitus, posterior wall weakness just above the introitus  TODAY'S TREATMENT:  05/20/23 Exercises: Stretches/mobility: Trunk rotation pulling leg over the trunk holding 30 sec bil.  Hamstring stretch holding 30 sec bil. Supine Happy baby holding 1 minutes in supine Cat cow 20 x with tactile cues to move in the correct direction Karolynn pose holding 30 sec Thread the needle holding 30 sec both ways 2 times each way Prone press up 5 x Strengthening: Laying on side hip  abduction 10 x bil.  Laying on side hip circles 10 x each way bil.  Side plank 10 x each side Quadruped lift opposite extremity 20 x     03/27/23 Manual: Internal pelvic floor techniques: No emotional/communication barriers or cognitive limitation. Patient is motivated to learn. Patient understands and agrees with treatment goals and plan. PT explains patient will be examined in standing, sitting, and lying down to see how their muscles and joints work. When they are ready, they will be asked to remove their underwear so PT can examine their perineum. The patient is also given the option of providing their own chaperone as one is not provided in our facility. The patient also has the right and is explained the right to defer or refuse any part of the evaluation or treatment including the internal exam. With the patient's consent, PT will use one gloved finger to gently assess the muscles of the pelvic floor, seeing how well it contracts and relaxes and if there is muscle symmetry. After, the patient will get dressed and PT and patient will discuss exam findings and plan of care. PT and patient discuss plan of care, schedule, attendance policy and HEP activities.  Manual work externally releasing the ischiocavernosus, bulbocavernosus, around the clitoris, and perineal body monitoring for pain and relaxation of the tissue   03/20/23 Manual: Soft tissue mobilization: To assess for dry needling Manual work to the right side of the pelvic floor, perineal body, superior transverse, puborectalis, coccygeus, external anal sphincter, and iliococcygeus, obturator internist to elongate after dry needling and palpate for further tenderness Trigger Point Dry-Needling  Treatment instructions: Expect mild to moderate muscle soreness. S/S of pneumothorax if dry needled over a lung field, and to seek immediate medical attention should they occur. Patient verbalized understanding of these instructions and  education.  Patient Consent Given: Yes Education handout provided: Yes Muscles treated: perineal body, left superior transverse, puborectalis, iliococcygeus, coccygeus, obturator internist Electrical stimulation performed: No Parameters: N/A Treatment response/outcome: trigger point response and elongation of muscle Exercises: Stretches/mobility: Cat cow 20 x  Childs pose holding 30 sec all 3 ways Pigeon pose holding 30 sec bil. V stretch to elongate the hip adductors holding 30 sec   PATIENT EDUCATION: 05/20/23 Education details: Access Code: [email or web address], information on dry needling Person educated: Patient Education method: Explanation, Demonstration, Tactile cues, Verbal cues, and Handouts Education comprehension: verbalized understanding, returned demonstration, verbal cues required, tactile cues required, and needs further education    HOME EXERCISE PROGRAM: 05/20/23 Access Code: [email or web address] URL: [email or web address] Date: 05/20/2023 Prepared by: Channing Pereyra  Exercises - Supine Piriformis Stretch with Leg Straight  -  1 x daily - 7 x weekly - 1 sets - 2 reps - 30 sec hold - Supine Hamstring Stretch  - 1 x daily - 7 x weekly - 1 sets - 2 reps - 30 sec hold - Happy Baby with Pelvic Floor Lengthening  - 1 x daily - 7 x weekly - 1 sets - 1 reps - 30 sec hold - Cat Cow  - 1 x daily - 7 x weekly - 1 sets - 10 reps - Child's Pose Stretch  - 1 x daily - 7 x weekly - 1 sets - 1 reps - 30 sec hold - Quadruped Full Range Thoracic Rotation with Reach  - 1 x daily - 7 x weekly - 2 reps - 5 sec hold - Prone Press Up  - 1 x daily - 7 x weekly - 1 sets - 10 reps - Sidelying Diagonal Hip Abduction  - 1 x daily - 2 x weekly - 2 sets - 10 reps - Sidelying Hip Circles  - 1 x daily - 2 x weekly - 2 sets - 10 reps - Side Plank on Knees  - 1 x daily - 2 x weekly - 1 sets - 10 reps - Quadruped Pelvic Floor Contraction with Opposite Arm and Leg Lift  - 1 x daily - 2 x weekly - 2 sets - 10  reps  Patient Education - Trigger Point Dry Needling  ASSESSMENT:  CLINICAL IMPRESSION: Patient is a 48 y.o. female who was seen today for physical therapy  treatment for dyspareunia and constipation. Patient has to double void to fully empty her bladder. Patient is taking Linzess  and not straining with bowel movements and no pain. Patient lower abdominal pain is 3/10. She only wakes up 2 times at night and sleeps better. She has increased in hip strength. Patient functional scores have decreased. She is not as frustrated about her issues. Pelvic floor not assessed due to patient having several infection vaginally. She understands that exercise and stretches help her pain.   OBJECTIVE IMPAIRMENTS: decreased coordination, decreased strength, increased fascial restrictions, increased muscle spasms, and pain.   ACTIVITY LIMITATIONS: toileting  PARTICIPATION LIMITATIONS: interpersonal relationship  PERSONAL FACTORS: 1 comorbidity: Anal fissure; Colitis; Hypothyroidism;  are also affecting patient's functional outcome.   REHAB POTENTIAL: Good  CLINICAL DECISION MAKING: Stable/uncomplicated  EVALUATION COMPLEXITY: Low   GOALS: Goals reviewed with patient? Yes  SHORT TERM GOALS: Target date: 12/30/22  Patient is independent with initial HEP.  Baseline:Not educated yet Goal status: Met 12/24/22   LONG TERM GOALS: Target date: 08/26/23  Patient is independent with advanced HEP for stretches and core strength.  Baseline: not educated yet Goal status: Met 05/07/23  2.  Patient is independent with abdominal massage to improve defecation.  Baseline: Not educated yet Goal status: met 12/31/22  3.  Patient is abel to defecate with pain level </= 1/10 and straining reduced >/= 75% due to improved lengthening of the muscles.  Baseline:  No strain and no pain Goal status: Met 05/20/23  4.  Patient is able to have penile penetration vaginally with pain level </= 1-2/10.  Baseline: Goal  status: Met 05/20/23  5.  Patient is able to fully empty her bladder due to the ability to relax her pelvic floor.  Baseline: Has to sit 2 times Goal status: not met 05/20/23    PLAN: Discharge to HEP this visit.    Channing Pereyra, PT 05/20/23 9:20 AM   PHYSICAL THERAPY DISCHARGE SUMMARY  Visits from Start of Care: 10  Current functional level related to goals / functional outcomes: See above.    Remaining deficits: See above.    Education / Equipment: HEP   Patient agrees to discharge. Patient goals were met. Patient is being discharged due to meeting the stated rehab goals. Thank you for the referral.   Channing Pereyra, PT 05/20/23 9:20 AM

## 2023-05-22 DIAGNOSIS — F331 Major depressive disorder, recurrent, moderate: Secondary | ICD-10-CM | POA: Diagnosis not present

## 2023-05-27 ENCOUNTER — Ambulatory Visit (INDEPENDENT_AMBULATORY_CARE_PROVIDER_SITE_OTHER): Payer: Medicaid Other | Admitting: Internal Medicine

## 2023-05-27 ENCOUNTER — Encounter (INDEPENDENT_AMBULATORY_CARE_PROVIDER_SITE_OTHER): Payer: Self-pay | Admitting: Internal Medicine

## 2023-05-27 VITALS — BP 118/74 | HR 68 | Temp 98.4°F | Ht 62.0 in | Wt 147.0 lb

## 2023-05-27 DIAGNOSIS — Z6826 Body mass index (BMI) 26.0-26.9, adult: Secondary | ICD-10-CM

## 2023-05-27 DIAGNOSIS — F331 Major depressive disorder, recurrent, moderate: Secondary | ICD-10-CM | POA: Diagnosis not present

## 2023-05-27 DIAGNOSIS — E559 Vitamin D deficiency, unspecified: Secondary | ICD-10-CM

## 2023-05-27 DIAGNOSIS — E7800 Pure hypercholesterolemia, unspecified: Secondary | ICD-10-CM | POA: Diagnosis not present

## 2023-05-27 DIAGNOSIS — E669 Obesity, unspecified: Secondary | ICD-10-CM

## 2023-05-27 DIAGNOSIS — R7303 Prediabetes: Secondary | ICD-10-CM | POA: Diagnosis not present

## 2023-05-27 MED ORDER — ERGOCALCIFEROL 1.25 MG (50000 UT) PO CAPS: 50000.0000 [IU] | ORAL_CAPSULE | ORAL | 0 refills | Status: DC

## 2023-05-27 MED ORDER — METFORMIN HCL 500 MG PO TABS: 500.0000 mg | ORAL_TABLET | Freq: Every day | ORAL | 0 refills | Status: DC

## 2023-05-27 NOTE — Progress Notes (Signed)
Office: [phone]  /  Fax: [phone]  Weight Summary And Biometrics  Vitals Temp: 98.4 F (36.9 C) BP: 118/74 Pulse Rate: 68 SpO2: 99 %   Anthropometric Measurements Height: 5\' 2"  (1.575 m) Weight: 147 lb (66.7 kg) BMI (Calculated): 26.88 Weight at Last Visit: 143 lb Weight Lost Since Last Visit: 0 lb Weight Gained Since Last Visit: 4 lb Starting Weight: 167 lb Total Weight Loss (lbs): 20 lb (9.072 kg)   Body Composition  Body Fat %: 33.4 % Fat Mass (lbs): 49 lbs Muscle Mass (lbs): 93 lbs Total Body Water (lbs): 62.8 lbs Visceral Fat Rating : 6    No data recorded Today's Visit #: 22  Starting Date: 08/08/21   Subjective   Chief Complaint: Obesity  Carol Welch is here to discuss her progress with her obesity treatment plan. She is on the the Category 1 Plan and states she is following her eating plan approximately 50 % of the time. She states she is exercising 30 minutes 2 times per week.  Weight Progress Since Last Visit:  Discussed the use of AI scribe software for clinical note transcription with the patient, who gave verbal consent to proceed.  History of Present Illness   The patient, with a history of H. pylori infection and depression, presents for medical weight management. She reports a recent weight gain of four pounds, which she attributes to increased stress and overeating during the holiday season. Despite being on metformin, she expresses frustration with her inability to lose weight since starting treatment for H. pylori. She is currently undergoing her fourth treatment to eradicate the bacteria. The patient also mentions experiencing bloating and constipation.  The patient has been managing her depression with Lexapro, which she reports has been helpful. She also mentions a decrease in physical activity due to other health issues and the impact of depression. She expresses concern about her A1c levels, which have been increasing despite her  efforts to maintain a diet low in sugars and flours. She also expresses concern about her cholesterol levels, which were found to be high during a check in August. The patient denies smoking and reports a family history of high cholesterol on her mother's side.     Patient Reported Barriers to Progress: none.   Orexigenic Control: Denies problems with appetite and hunger signals.  Denies problems with satiety and satiation.  Denies problems with eating patterns and portion control.  Denies abnormal cravings. Denies feeling deprived or restricted.   Pharmacotherapy for weight management: She is currently taking Metformin (off label use for incretin effect and / or insulin resistance and / or diabetes prevention) with adequate clinical response  and without side effects..   Assessment and Plan   Treatment Plan For Obesity:  Recommended Dietary Goals  Carol Welch is currently in the action stage of change. As such, her goal is to continue weight management plan. She has agreed to: continue current plan  Behavioral Health and Counseling  We discussed the following behavioral modification strategies today: continue to work on maintaining a reduced calorie state, getting the recommended amount of protein, incorporating whole foods, making healthy choices, staying well hydrated and practicing mindfulness when eating..  Additional education and resources provided today: None  Recommended Physical Activity Goals  Carol Welch has been advised to work up to 150 minutes of moderate intensity aerobic activity a week and strengthening exercises 2-3 times per week for cardiovascular health, weight loss maintenance and preservation of muscle mass.   She has agreed to :  Think about enjoyable ways to increase daily physical activity and overcoming barriers to exercise and Increase physical activity in their day and reduce sedentary time (increase NEAT).  Pharmacotherapy  We discussed various medication  options to help Carol Welch with her weight loss efforts and we both agreed to : continue current anti-obesity medication regimen  Associated Conditions Impacted by Obesity Treatment  Obesity, Beginning BMI 30.54 Assessment & Plan: Gained 4 pounds over the holidays, likely due to stress and reduced physical activity. Current BMI is 26, and body fat percentage is 33%. Significant weight loss from 206 lbs in 2020 to 148 lbs. Aims to lose an additional 10 pounds. Currently on metformin and Lexapro. Discussed the impact of antibiotics on gut health and weight gain. Informed about the benefits of increased physical activity and adequate protein intake for weight management and muscle preservation, especially during perimenopause. - Increase physical activity - Ensure adequate protein intake (30-40 grams per meal) - Continue metformin - Follow up with Doctor Dalbert Garnet every four weeks   Pre-diabetes Assessment & Plan: A1c was 5.8% in August 2024. On metformin and maintains a low-carb diet. Family history of diabetes on the paternal side. Insulin levels are low. Exercise recommended to improve insulin sensitivity. Discussed the option of increasing metformin to twice a day if A1c continues with an upward trend. - Increase physical activity - Continue metformin - Monitor A1c periodically -Continue B12 recommend checking levels if not checked in the last 12 months.  Orders: -     metFORMIN HCl; Take 1 tablet (500 mg total) by mouth daily with breakfast.  Dispense: 30 tablet; Refill: 0  Vitamin D insufficiency Assessment & Plan: Medication refill today recommend switching to over-the-counter supplementation if appropriate.  Orders: -     Ergocalciferol; Take 1 capsule (50,000 Units total) by mouth once a week.  Dispense: 4 capsule; Refill: 0  Pure hypercholesterolemia Assessment & Plan:  LDL of 166 mg/dL as of August 6433. HDL is high, which is protective. Cardiovascular risk is 0.8%, considered  low. No family history of early heart attacks or strokes. No current medication for hyperlipidemia. Discussed the genetic component of hyperlipidemia and the importance of a diet low in saturated fats. Informed about the low cardiovascular risk and the benefits of regular exercise. - Maintain a diet low in saturated fats - Continue regular exercise - Monitor cholesterol levels periodically    Moderate episode of recurrent major depressive disorder (HCC) Assessment & Plan: Currently on Lexapro and reports improvement. Recent stress may have contributed to weight gain. Discussed the importance of managing stress and monitoring mental health status. - Continue Lexapro - Monitor mental health status     The 10-year ASCVD risk score (Arnett DK, et al., 2019) is: 0.8%        Objective   Physical Exam:  Blood pressure 118/74, pulse 68, temperature 98.4 F (36.9 C), height 5\' 2"  (1.575 m), weight 147 lb (66.7 kg), last menstrual period 12/19/2019, SpO2 99%. Body mass index is 26.89 kg/m.  General: She is overweight, cooperative, alert, well developed, and in no acute distress. PSYCH: Has normal mood, affect and thought process.   HEENT: EOMI, sclerae are anicteric. Lungs: Normal breathing effort, no conversational dyspnea. Extremities: No edema.  Neurologic: No gross sensory or motor deficits. No tremors or fasciculations noted.    Diagnostic Data Reviewed:  BMET    Component Value Date/Time   NA 138 02/27/2023 1153   NA 138 12/18/2022 1427   K 4.0 02/27/2023 1153  CL 107 02/27/2023 1153   CO2 25 02/27/2023 1153   GLUCOSE 94 02/27/2023 1153   BUN 17 02/27/2023 1153   BUN 14 12/18/2022 1427   CREATININE 0.61 02/27/2023 1153   CALCIUM 8.6 (L) 02/27/2023 1153   GFRNONAA >60 02/27/2023 1153   GFRAA >60 01/02/2020 1911   Lab Results  Component Value Date   HGBA1C 5.8 (H) 12/18/2022   HGBA1C 5.6 08/08/2021   Lab Results  Component Value Date   INSULIN 2.7 12/18/2022    INSULIN 5.7 08/08/2021   Lab Results  Component Value Date   TSH 3.440 12/18/2022   CBC    Component Value Date/Time   WBC 4.9 02/27/2023 1153   RBC 4.27 02/27/2023 1153   HGB 12.9 02/27/2023 1153   HGB 13.1 12/18/2022 1427   HCT 39.0 02/27/2023 1153   HCT 39.2 12/18/2022 1427   PLT 230 02/27/2023 1153   PLT 280 12/18/2022 1427   MCV 91.3 02/27/2023 1153   MCV 89 12/18/2022 1427   MCH 30.2 02/27/2023 1153   MCHC 33.1 02/27/2023 1153   RDW 13.8 02/27/2023 1153   RDW 13.0 12/18/2022 1427   Iron Studies No results found for: "IRON", "TIBC", "FERRITIN", "IRONPCTSAT" Lipid Panel     Component Value Date/Time   CHOL 251 (H) 12/18/2022 1427   TRIG 89 12/18/2022 1427   HDL 70 12/18/2022 1427   LDLCALC 166 (H) 12/18/2022 1427   Hepatic Function Panel     Component Value Date/Time   PROT 7.3 12/18/2022 1427   ALBUMIN 4.3 12/18/2022 1427   AST 13 12/18/2022 1427   ALT 7 12/18/2022 1427   ALKPHOS 73 12/18/2022 1427   BILITOT 0.3 12/18/2022 1427   BILIDIR <0.10 05/29/2021 1035   IBILI NOT CALCULATED 03/13/2021 1415      Component Value Date/Time   TSH 3.440 12/18/2022 1427   Nutritional Lab Results  Component Value Date   VD25OH 41.4 12/18/2022   VD25OH 49.7 07/17/2022   VD25OH 48.0 02/11/2022    Follow-Up   Return in about 4 weeks (around 06/24/2023) for Dr.[email or web address] Kitchen She was informed of the importance of frequent follow up visits to maximize her success with intensive lifestyle modifications for her multiple health conditions.  Attestation Statement   Reviewed by clinician on day of visit: allergies, medications, problem list, medical history, surgical history, family history, social history, and previous encounter notes.     Worthy Rancher, MD

## 2023-05-27 NOTE — Assessment & Plan Note (Signed)
A1c was 5.8% in August 2024. On metformin and maintains a low-carb diet. Family history of diabetes on the paternal side. Insulin levels are low. Exercise recommended to improve insulin sensitivity. Discussed the option of increasing metformin to twice a day if A1c continues with an upward trend. - Increase physical activity - Continue metformin - Monitor A1c periodically -Continue B12 recommend checking levels if not checked in the last 12 months.

## 2023-05-27 NOTE — Assessment & Plan Note (Signed)
Gained 4 pounds over the holidays, likely due to stress and reduced physical activity. Current BMI is 26, and body fat percentage is 33%. Significant weight loss from 206 lbs in 2020 to 148 lbs. Aims to lose an additional 10 pounds. Currently on metformin and Lexapro. Discussed the impact of antibiotics on gut health and weight gain. Informed about the benefits of increased physical activity and adequate protein intake for weight management and muscle preservation, especially during perimenopause. - Increase physical activity - Ensure adequate protein intake (30-40 grams per meal) - Continue metformin - Follow up with Doctor Dalbert Garnet every four weeks

## 2023-05-27 NOTE — Assessment & Plan Note (Signed)
  LDL of 166 mg/dL as of August 1610. HDL is high, which is protective. Cardiovascular risk is 0.8%, considered low. No family history of early heart attacks or strokes. No current medication for hyperlipidemia. Discussed the genetic component of hyperlipidemia and the importance of a diet low in saturated fats. Informed about the low cardiovascular risk and the benefits of regular exercise. - Maintain a diet low in saturated fats - Continue regular exercise - Monitor cholesterol levels periodically

## 2023-05-27 NOTE — Assessment & Plan Note (Signed)
Currently on Lexapro and reports improvement. Recent stress may have contributed to weight gain. Discussed the importance of managing stress and monitoring mental health status. - Continue Lexapro - Monitor mental health status

## 2023-05-27 NOTE — Assessment & Plan Note (Signed)
Medication refill today recommend switching to over-the-counter supplementation if appropriate.

## 2023-05-28 ENCOUNTER — Ambulatory Visit: Payer: Medicaid Other | Admitting: Vascular Surgery

## 2023-05-28 DIAGNOSIS — F331 Major depressive disorder, recurrent, moderate: Secondary | ICD-10-CM | POA: Diagnosis not present

## 2023-05-29 ENCOUNTER — Other Ambulatory Visit: Payer: Self-pay

## 2023-05-29 ENCOUNTER — Other Ambulatory Visit (HOSPITAL_COMMUNITY): Payer: Self-pay

## 2023-05-29 MED ORDER — BIS SUBCIT-METRONID-TETRACYC 140-125-125 MG PO CAPS: 3.0000 | ORAL_CAPSULE | Freq: Three times a day (TID) | ORAL | 0 refills | Status: DC

## 2023-05-29 MED ORDER — OMEPRAZOLE 20 MG PO CPDR: 20.0000 mg | DELAYED_RELEASE_CAPSULE | Freq: Two times a day (BID) | ORAL | 0 refills | Status: DC

## 2023-05-29 NOTE — Telephone Encounter (Signed)
Patient called back stating [facility] pharmacy has not received Pylera medication, would like a call back when done

## 2023-05-29 NOTE — Telephone Encounter (Signed)
Scripts have expired.  New scripts sent to pharmacy.

## 2023-06-04 DIAGNOSIS — F331 Major depressive disorder, recurrent, moderate: Secondary | ICD-10-CM | POA: Diagnosis not present

## 2023-06-06 ENCOUNTER — Telehealth: Payer: Self-pay | Admitting: Gastroenterology

## 2023-06-06 ENCOUNTER — Other Ambulatory Visit: Payer: Self-pay

## 2023-06-06 MED ORDER — ONDANSETRON HCL 4 MG PO TABS: 4.0000 mg | ORAL_TABLET | Freq: Three times a day (TID) | ORAL | 0 refills | Status: DC | PRN

## 2023-06-06 MED ORDER — PANTOPRAZOLE SODIUM 40 MG PO TBEC
40.0000 mg | DELAYED_RELEASE_TABLET | Freq: Two times a day (BID) | ORAL | 0 refills | Status: DC
Start: 2023-06-06 — End: 2024-02-19

## 2023-06-06 NOTE — Telephone Encounter (Signed)
Try this:  D/c omeprazole (this may be causing the headache)  Use pantoprazole 40 mg bid  instead (she may have some of this at home - it is on list)  Ondansetron 4 mg q 8 hrs prn nausea

## 2023-06-06 NOTE — Telephone Encounter (Signed)
Spoke with patient regarding MD recommendations. States she is not currently on pantoprazole & does not have any at home, she's only been taking omeprazole. Prescription sent to pharmacy to cover remaining doses of H. Pylori regimen & zofran sent as well. She's been advised to call back if symptoms are not improving.

## 2023-06-06 NOTE — Telephone Encounter (Signed)
Patient is having nausea Requesting to speak with a nurse.

## 2023-06-06 NOTE — Telephone Encounter (Signed)
Returned patient call & she has complaints of a headache and nausea that started when she began Pylera and omeprazole 5 days ago. She has been taking the medication with food, but has a poor appetite d/t nausea. Headache last all day. She's alternating between Tylenol & Advil with little relief. She'd like to know if she should continue medication & if so is there anything she can do to help with side effects. Last seen with Dr. Adela Lank 01/29/23.

## 2023-06-07 DIAGNOSIS — Z419 Encounter for procedure for purposes other than remedying health state, unspecified: Secondary | ICD-10-CM | POA: Diagnosis not present

## 2023-06-13 DIAGNOSIS — F331 Major depressive disorder, recurrent, moderate: Secondary | ICD-10-CM | POA: Diagnosis not present

## 2023-06-19 DIAGNOSIS — F331 Major depressive disorder, recurrent, moderate: Secondary | ICD-10-CM | POA: Diagnosis not present

## 2023-06-25 ENCOUNTER — Telehealth (INDEPENDENT_AMBULATORY_CARE_PROVIDER_SITE_OTHER): Payer: Self-pay | Admitting: *Deleted

## 2023-06-25 ENCOUNTER — Telehealth (INDEPENDENT_AMBULATORY_CARE_PROVIDER_SITE_OTHER): Payer: Medicaid Other | Admitting: Family Medicine

## 2023-06-25 DIAGNOSIS — E559 Vitamin D deficiency, unspecified: Secondary | ICD-10-CM | POA: Diagnosis not present

## 2023-06-25 DIAGNOSIS — E669 Obesity, unspecified: Secondary | ICD-10-CM | POA: Diagnosis not present

## 2023-06-25 DIAGNOSIS — Z6826 Body mass index (BMI) 26.0-26.9, adult: Secondary | ICD-10-CM

## 2023-06-25 DIAGNOSIS — F32A Depression, unspecified: Secondary | ICD-10-CM

## 2023-06-25 DIAGNOSIS — A048 Other specified bacterial intestinal infections: Secondary | ICD-10-CM

## 2023-06-25 DIAGNOSIS — F418 Other specified anxiety disorders: Secondary | ICD-10-CM

## 2023-06-25 DIAGNOSIS — F419 Anxiety disorder, unspecified: Secondary | ICD-10-CM

## 2023-06-25 MED ORDER — ERGOCALCIFEROL 1.25 MG (50000 UT) PO CAPS: 50000.0000 [IU] | ORAL_CAPSULE | ORAL | 0 refills | Status: DC

## 2023-06-25 NOTE — Telephone Encounter (Signed)
Called the patient to start video visit questions, no answer but left a voice message to call back.

## 2023-06-25 NOTE — Progress Notes (Signed)
.smr  Office: [phone]  /  Fax: [phone]  WEIGHT SUMMARY AND BIOMETRICS  No data recorded No data recorded No data recorded  Chief Complaint: OBESITY  I connected with  Carol Welch on 06/25/23 by a video enabled telemedicine application and verified that I am speaking with the correct person using two identifiers.   I discussed the limitations of evaluation and management by telemedicine. The patient expressed understanding and agreed to proceed.   Patient is at home Physician is at home  History of Present Illness   Carol Welch is a 48 year old female with obesity and vitamin D deficiency who presents for management of her conditions.  She has maintained her weight over the last month, having lost a total of twenty pounds. She follows her category one eating plan seventy-five percent of the time and engages in walking for exercise for thirty minutes, three to four times per week. Her goal is to maintain her current weight while she is unable to fully adhere to her eating plan.  She has been diagnosed with an H. pylori infection and has undergone three rounds of antibiotics without success. She is currently on a twenty-day course of antibiotics and is experiencing nausea and gastrointestinal upset, which complicates adherence to her eating plan and increases her stress.  She reports increased anxiety and depression recently, which she attributes to additional health issues. She feels frustrated by her inability to concentrate on weight loss and notes a worsening mood. Her medications have been adjusted, and she is working with a therapist to improve communication with her family, particularly her husband, to gain better support.  She has been stable on her prescription vitamin D and requests a refill.          PHYSICAL EXAM:  Last menstrual period 12/19/2019. There is no height or weight on file to calculate BMI.  DIAGNOSTIC DATA REVIEWED:  BMET    Component Value  Date/Time   NA 138 02/27/2023 1153   NA 138 12/18/2022 1427   K 4.0 02/27/2023 1153   CL 107 02/27/2023 1153   CO2 25 02/27/2023 1153   GLUCOSE 94 02/27/2023 1153   BUN 17 02/27/2023 1153   BUN 14 12/18/2022 1427   CREATININE 0.61 02/27/2023 1153   CALCIUM 8.6 (L) 02/27/2023 1153   GFRNONAA >60 02/27/2023 1153   GFRAA >60 01/02/2020 1911   Lab Results  Component Value Date   HGBA1C 5.8 (H) 12/18/2022   HGBA1C 5.6 08/08/2021   Lab Results  Component Value Date   INSULIN 2.7 12/18/2022   INSULIN 5.7 08/08/2021   Lab Results  Component Value Date   TSH 3.440 12/18/2022   CBC    Component Value Date/Time   WBC 4.9 02/27/2023 1153   RBC 4.27 02/27/2023 1153   HGB 12.9 02/27/2023 1153   HGB 13.1 12/18/2022 1427   HCT 39.0 02/27/2023 1153   HCT 39.2 12/18/2022 1427   PLT 230 02/27/2023 1153   PLT 280 12/18/2022 1427   MCV 91.3 02/27/2023 1153   MCV 89 12/18/2022 1427   MCH 30.2 02/27/2023 1153   MCHC 33.1 02/27/2023 1153   RDW 13.8 02/27/2023 1153   RDW 13.0 12/18/2022 1427   Iron Studies No results found for: "IRON", "TIBC", "FERRITIN", "IRONPCTSAT" Lipid Panel     Component Value Date/Time   CHOL 251 (H) 12/18/2022 1427   TRIG 89 12/18/2022 1427   HDL 70 12/18/2022 1427   LDLCALC 166 (H) 12/18/2022 1427   Hepatic Function  Panel     Component Value Date/Time   PROT 7.3 12/18/2022 1427   ALBUMIN 4.3 12/18/2022 1427   AST 13 12/18/2022 1427   ALT 7 12/18/2022 1427   ALKPHOS 73 12/18/2022 1427   BILITOT 0.3 12/18/2022 1427   BILIDIR <0.10 05/29/2021 1035   IBILI NOT CALCULATED 03/13/2021 1415      Component Value Date/Time   TSH 3.440 12/18/2022 1427   Nutritional Lab Results  Component Value Date   VD25OH 41.4 12/18/2022   VD25OH 49.7 07/17/2022   VD25OH 48.0 02/11/2022     Assessment and Plan    Helicobacter pylori Infection Diagnosed with H. pylori infection, currently on a 20-day course of antibiotics. Experiencing nausea and GI upset,  making it difficult to follow her eating plan. Advised to consume small amounts of carbohydrates to alleviate symptoms. - Consume small amounts of carbohydrates to alleviate GI symptoms -Follow up with GI as instructed  Anxiety and Depression Reports increased anxiety and depression, exacerbated by additional health issues. Working with a Paramedic and has had medication adjustments. Goal is to maintain weight while unable to follow her eating plan. - Continue therapy - Maintain current weight for now  -identify emotional eating behaviors -Concentrate on what she is doing right versus what she is struggling with  Obesity Maintained weight over the last month, with a total loss of 20 pounds. Following category one eating plan 75% of the time and walking 30 minutes 3-4 times per week. Recent health issues have impacted her ability to focus on weight loss, leading to frustration. Advised that maintaining weight is a success given current circumstances. - Continue category one eating plan - Continue exercise regimen - Maintain current weight - Follow up in 4 weeks  Vitamin D Deficiency Well-managed on prescription vitamin D, requests refill. - Refill prescription vitamin D  Follow-up - Follow up in 4 weeks - Contact via MyChart if any issues arise.        She was informed of the importance of frequent follow up visits to maximize her success with intensive lifestyle modifications for her multiple health conditions.    Quillian Quince, MD

## 2023-06-26 ENCOUNTER — Telehealth: Payer: Self-pay

## 2023-06-26 DIAGNOSIS — A048 Other specified bacterial intestinal infections: Secondary | ICD-10-CM

## 2023-06-26 NOTE — Telephone Encounter (Signed)
Called and spoke to patient. She just finished her treatment around Feb 7th or 8th.  She understands to pick up the [employer] stool kit from our office on the 2nd floor and collect the sample the week of March 10th. I reminded her to put the Collection Date and Time on the form before returning to the lab.  Patient expressed understanding.

## 2023-06-26 NOTE — Telephone Encounter (Signed)
-----   Message from [facility] Faxon H sent at 05/13/2023  6:00 PM EST ----- Regarding: pt needs diatherix H pylori stool test Patient was to do Pylera and omeprazole 20 for 14 days starting around 1-8.  One month later she needs to do [employer] stool test for H pylori

## 2023-06-27 DIAGNOSIS — R102 Pelvic and perineal pain: Secondary | ICD-10-CM | POA: Diagnosis not present

## 2023-06-27 DIAGNOSIS — M545 Low back pain, unspecified: Secondary | ICD-10-CM | POA: Diagnosis not present

## 2023-06-27 DIAGNOSIS — W19XXXA Unspecified fall, initial encounter: Secondary | ICD-10-CM | POA: Diagnosis not present

## 2023-07-04 DIAGNOSIS — F331 Major depressive disorder, recurrent, moderate: Secondary | ICD-10-CM | POA: Diagnosis not present

## 2023-07-05 DIAGNOSIS — Z419 Encounter for procedure for purposes other than remedying health state, unspecified: Secondary | ICD-10-CM | POA: Diagnosis not present

## 2023-07-11 NOTE — Telephone Encounter (Signed)
 Called and let patient know she should hold Pantoprazole

## 2023-07-11 NOTE — Telephone Encounter (Signed)
 PT called to find out what was the name of the medication she needs to hold prior to doing the stool kit. Please advise.

## 2023-07-18 DIAGNOSIS — A048 Other specified bacterial intestinal infections: Secondary | ICD-10-CM | POA: Diagnosis not present

## 2023-07-21 ENCOUNTER — Other Ambulatory Visit: Payer: Self-pay | Admitting: Obstetrics and Gynecology

## 2023-07-21 DIAGNOSIS — Z1231 Encounter for screening mammogram for malignant neoplasm of breast: Secondary | ICD-10-CM

## 2023-07-23 ENCOUNTER — Encounter: Payer: Self-pay | Admitting: Gastroenterology

## 2023-07-23 DIAGNOSIS — F331 Major depressive disorder, recurrent, moderate: Secondary | ICD-10-CM | POA: Diagnosis not present

## 2023-07-30 DIAGNOSIS — F331 Major depressive disorder, recurrent, moderate: Secondary | ICD-10-CM | POA: Diagnosis not present

## 2023-08-04 ENCOUNTER — Encounter: Payer: Self-pay | Admitting: Student

## 2023-08-04 ENCOUNTER — Ambulatory Visit: Admitting: Student

## 2023-08-04 ENCOUNTER — Other Ambulatory Visit (HOSPITAL_COMMUNITY)
Admission: RE | Admit: 2023-08-04 | Discharge: 2023-08-04 | Disposition: A | Discharge status: Home / Self Care | Source: Ambulatory Visit | Attending: Family Medicine | Admitting: Family Medicine

## 2023-08-04 VITALS — BP 109/66 | HR 63 | Ht 62.0 in | Wt 156.2 lb

## 2023-08-04 DIAGNOSIS — R300 Dysuria: Secondary | ICD-10-CM

## 2023-08-04 LAB — POCT WET PREP (WET MOUNT)
Clue Cells Wet Prep Whiff POC: NEGATIVE
Trichomonas Wet Prep HPF POC: ABSENT

## 2023-08-04 LAB — POCT URINALYSIS DIP (MANUAL ENTRY)
Bilirubin, UA: NEGATIVE
Blood, UA: NEGATIVE
Glucose, UA: NEGATIVE mg/dL
Ketones, POC UA: NEGATIVE mg/dL
Leukocytes, UA: NEGATIVE
Nitrite, UA: NEGATIVE
Protein Ur, POC: NEGATIVE mg/dL
Spec Grav, UA: >=1.03 — AB (ref 1.010–1.025)
Urobilinogen, UA: 0.2 U/dL
pH, UA: 5.5 (ref 5.0–8.0)

## 2023-08-04 MED ORDER — CEPHALEXIN 500 MG PO CAPS: 500.0000 mg | ORAL_CAPSULE | Freq: Two times a day (BID) | ORAL | 0 refills | Status: AC

## 2023-08-04 NOTE — Progress Notes (Signed)
    SUBJECTIVE:   CHIEF COMPLAINT / HPI:   Carol Welch is a 48 y.o. female  presenting for   STI check - recently became sexually active with a new partner, wants to be checked for STIs.  Symptoms include: Abnormal vaginal discharge  Dysuria:  Present for a few days.  Increased urinary frequency, pain with urination and pain over the bladder.  PERTINENT  PMH / PSH: Reviewed and updated   OBJECTIVE:   BP 109/66   Pulse 63   Ht 5\' 2"  (1.575 m)   Wt 156 lb 3.2 oz (70.9 kg)   LMP 12/19/2019   SpO2 100%   BMI 28.57 kg/m   Well-appearing, no acute distress Cardio: Regular rate, regular rhythm, no murmurs on exam. Pulm: Clear, no wheezing, no crackles. No increased work of breathing Abdominal: bowel sounds present, soft, non-tender, non-distended Extremities: no peripheral edema   Pelvic Exam: MA chaperone present  Normal external genitalia Discharge: thick white discharge  No cervical motion tenderness  Cervix visualized with no lesions       08/04/2023    9:01 AM 05/08/2023    2:42 PM 10/01/2022    9:06 AM  PHQ9 SCORE ONLY  PHQ-9 Total Score 11 15 9       ASSESSMENT/PLAN:   Assessment & Plan Dysuria Clinically meets criteria for UTI.  Will empirically treat with Keflex 500 mg twice daily for the next 5 days.  Per chart review it does not appear this patient is a chronic UTI.  If she begins to have more frequent UTIs consider urine culture.   STI Screening:  Wet prep preformed and was negative.  GC testing collected.  RPR and HIV ordered.  Birthcontrol and safe sex practices discussed with patient.  PREP discussed and offered to patient.   Glendale Chard, DO [facility]

## 2023-08-04 NOTE — Patient Instructions (Signed)
 I have sent in antibiotics for the urine. It is called Keflex, you should take this twice a day for the next 5 days.   I will send you a message on MyChart with the rest of your results.

## 2023-08-05 ENCOUNTER — Encounter: Payer: Self-pay | Admitting: Student

## 2023-08-05 LAB — CERVICOVAGINAL ANCILLARY ONLY
Chlamydia: NEGATIVE
Comment: NEGATIVE
Comment: NORMAL
Neisseria Gonorrhea: NEGATIVE

## 2023-08-05 LAB — HIV ANTIBODY (ROUTINE TESTING W REFLEX): HIV Screen 4th Generation wRfx: NONREACTIVE

## 2023-08-05 LAB — RPR: RPR Ser Ql: NONREACTIVE

## 2023-08-06 ENCOUNTER — Encounter: Payer: Self-pay | Admitting: Dermatology

## 2023-08-06 ENCOUNTER — Ambulatory Visit: Payer: Medicaid Other | Admitting: Dermatology

## 2023-08-06 VITALS — BP 98/66 | HR 61

## 2023-08-06 DIAGNOSIS — W908XXA Exposure to other nonionizing radiation, initial encounter: Secondary | ICD-10-CM | POA: Diagnosis not present

## 2023-08-06 DIAGNOSIS — D225 Melanocytic nevi of trunk: Secondary | ICD-10-CM | POA: Diagnosis not present

## 2023-08-06 DIAGNOSIS — L814 Other melanin hyperpigmentation: Secondary | ICD-10-CM | POA: Diagnosis not present

## 2023-08-06 DIAGNOSIS — D492 Neoplasm of unspecified behavior of bone, soft tissue, and skin: Secondary | ICD-10-CM | POA: Diagnosis not present

## 2023-08-06 DIAGNOSIS — D485 Neoplasm of uncertain behavior of skin: Secondary | ICD-10-CM

## 2023-08-06 DIAGNOSIS — D1801 Hemangioma of skin and subcutaneous tissue: Secondary | ICD-10-CM | POA: Diagnosis not present

## 2023-08-06 DIAGNOSIS — L578 Other skin changes due to chronic exposure to nonionizing radiation: Secondary | ICD-10-CM

## 2023-08-06 DIAGNOSIS — L905 Scar conditions and fibrosis of skin: Secondary | ICD-10-CM | POA: Diagnosis not present

## 2023-08-06 DIAGNOSIS — F331 Major depressive disorder, recurrent, moderate: Secondary | ICD-10-CM | POA: Diagnosis not present

## 2023-08-06 DIAGNOSIS — Z1283 Encounter for screening for malignant neoplasm of skin: Secondary | ICD-10-CM | POA: Diagnosis not present

## 2023-08-06 DIAGNOSIS — D229 Melanocytic nevi, unspecified: Secondary | ICD-10-CM

## 2023-08-06 DIAGNOSIS — L821 Other seborrheic keratosis: Secondary | ICD-10-CM | POA: Diagnosis not present

## 2023-08-06 NOTE — Patient Instructions (Signed)

## 2023-08-06 NOTE — Progress Notes (Signed)
   New Patient Visit   Subjective  Carol Welch is a 48 y.o. female who presents for the following: Skin Cancer Screening and Full Body Skin Exam  The patient presents for Total-Body Skin Exam (TBSE) for skin cancer screening and mole check. The patient has spots, moles and lesions to be evaluated, some may be new or changing and the patient may have concern these could be cancer. Pt has birthmark on right upper arm that has changed and is now pink and she can feel it whereas before she couldn't. She has a spot on back of her neck for a long time that seems to have started as a pimple but now is different.  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  Objective  Well appearing patient in no apparent distress; mood and affect are within normal limits.  A full examination was performed including scalp, head, eyes, ears, nose, lips, neck, chest, axillae, abdomen, back, buttocks, bilateral upper extremities, bilateral lower extremities, hands, feet, fingers, toes, fingernails, and toenails. All findings within normal limits unless otherwise noted below.   Relevant physical exam findings are noted in the Assessment and Plan.    Assessment & Plan   SKIN CANCER SCREENING PERFORMED TODAY.  ACTINIC DAMAGE - Chronic condition, secondary to cumulative UV/sun exposure - diffuse scaly erythematous macules with underlying dyspigmentation - Recommend daily broad spectrum sunscreen SPF 30+ to sun-exposed areas, reapply every 2 hours as needed.  - Staying in the shade or wearing long sleeves, sun glasses (UVA+UVB protection) and wide brim hats (4-inch brim around the entire  circumference of the hat) are also recommended for sun protection.  - Call for new or changing lesions.   MELANOCYTIC NEVI - Tan-brown and/or pink-flesh-colored symmetric macules and papules - Benign appearing on exam today - Observation - Call clinic for new or changing moles - Recommend daily use of broad spectrum spf 30+ sunscreen to sun-exposed areas.   SEBORRHEIC KERATOSIS - Stuck-on, waxy, tan-brown papules and/or plaques  - Benign-appearing - Discussed benign etiology and prognosis. - Observe - Call for any changes  LENTIGINES Exam: scattered tan macules Due to sun exposure Treatment Plan: Benign-appearing, observe. Recommend daily broad spectrum sunscreen SPF 30+ to sun-exposed areas, reapply every 2 hours as needed.  Call for any changes    HEMANGIOMA Exam: red papule(s) Discussed benign nature. Recommend observation. Call for changes.        No follow-ups on file.  Owens Shark, CMA, am acting as scribe for [employer], DO.   Documentation: I have reviewed the above documentation for accuracy and completeness, and I agree with the above.  Langston Reusing, DO

## 2023-08-08 LAB — SURGICAL PATHOLOGY

## 2023-08-11 ENCOUNTER — Encounter: Payer: Self-pay | Admitting: Dermatology

## 2023-08-11 ENCOUNTER — Other Ambulatory Visit: Payer: Self-pay

## 2023-08-11 ENCOUNTER — Encounter: Payer: Self-pay | Admitting: Student

## 2023-08-11 DIAGNOSIS — N898 Other specified noninflammatory disorders of vagina: Secondary | ICD-10-CM

## 2023-08-11 MED ORDER — LINACLOTIDE 145 MCG PO CAPS: 145.0000 ug | ORAL_CAPSULE | Freq: Every day | ORAL | 0 refills | Status: DC

## 2023-08-11 NOTE — Progress Notes (Signed)
 Refill request for Linzess 145 mcg from [employer].

## 2023-08-11 NOTE — Progress Notes (Signed)
 HI Carol Welch,  Please call pt and notify that their bx results showed an abnormal mole that requires a full excision in office with Dr Caralyn Guile

## 2023-08-12 MED ORDER — FLUCONAZOLE 150 MG PO TABS: 150.0000 mg | ORAL_TABLET | Freq: Once | ORAL | 0 refills | Status: AC

## 2023-08-13 DIAGNOSIS — F331 Major depressive disorder, recurrent, moderate: Secondary | ICD-10-CM | POA: Diagnosis not present

## 2023-08-16 DIAGNOSIS — Z419 Encounter for procedure for purposes other than remedying health state, unspecified: Secondary | ICD-10-CM | POA: Diagnosis not present

## 2023-08-17 DIAGNOSIS — R1011 Right upper quadrant pain: Secondary | ICD-10-CM | POA: Diagnosis not present

## 2023-08-17 DIAGNOSIS — R110 Nausea: Secondary | ICD-10-CM | POA: Diagnosis not present

## 2023-08-18 ENCOUNTER — Ambulatory Visit (INDEPENDENT_AMBULATORY_CARE_PROVIDER_SITE_OTHER): Admitting: Family Medicine

## 2023-08-18 ENCOUNTER — Encounter (INDEPENDENT_AMBULATORY_CARE_PROVIDER_SITE_OTHER): Payer: Self-pay | Admitting: Family Medicine

## 2023-08-18 ENCOUNTER — Other Ambulatory Visit (INDEPENDENT_AMBULATORY_CARE_PROVIDER_SITE_OTHER): Payer: Self-pay | Admitting: Family Medicine

## 2023-08-18 VITALS — BP 108/71 | HR 65 | Temp 98.1°F | Ht 62.0 in | Wt 150.0 lb

## 2023-08-18 DIAGNOSIS — E669 Obesity, unspecified: Secondary | ICD-10-CM

## 2023-08-18 DIAGNOSIS — F3289 Other specified depressive episodes: Secondary | ICD-10-CM

## 2023-08-18 DIAGNOSIS — Z6827 Body mass index (BMI) 27.0-27.9, adult: Secondary | ICD-10-CM | POA: Diagnosis not present

## 2023-08-18 DIAGNOSIS — R1011 Right upper quadrant pain: Secondary | ICD-10-CM

## 2023-08-18 DIAGNOSIS — E559 Vitamin D deficiency, unspecified: Secondary | ICD-10-CM | POA: Diagnosis not present

## 2023-08-18 DIAGNOSIS — F5089 Other specified eating disorder: Secondary | ICD-10-CM

## 2023-08-18 DIAGNOSIS — R7303 Prediabetes: Secondary | ICD-10-CM | POA: Diagnosis not present

## 2023-08-18 MED ORDER — BUPROPION HCL ER (SR) 150 MG PO TB12: 150.0000 mg | ORAL_TABLET | Freq: Every day | ORAL | 0 refills | Status: DC

## 2023-08-18 MED ORDER — METFORMIN HCL 500 MG PO TABS: 500.0000 mg | ORAL_TABLET | Freq: Every day | ORAL | 0 refills | Status: DC

## 2023-08-18 MED ORDER — ERGOCALCIFEROL 1.25 MG (50000 UT) PO CAPS: 50000.0000 [IU] | ORAL_CAPSULE | ORAL | 0 refills | Status: DC

## 2023-08-18 NOTE — Progress Notes (Signed)
 Office: [phone]  /  Fax: [phone]  WEIGHT SUMMARY AND BIOMETRICS  Anthropometric Measurements Height: 5\' 2"  (1.575 m) Weight: 150 lb (68 kg) BMI (Calculated): 27.43 Weight at Last Visit: 147lb Weight Lost Since Last Visit: 0lb Weight Gained Since Last Visit: 3lb Starting Weight: 167lb Total Weight Loss (lbs): 17 lb (7.711 kg)   Body Composition  Body Fat %: 34.9 % Fat Mass (lbs): 52.4 lbs Muscle Mass (lbs): 93 lbs Total Body Water (lbs): 64.4 lbs Visceral Fat Rating : 7   Other Clinical Data Fasting: No Labs: no Today's Visit #: 23 Starting Date: 08/08/21    Chief Complaint: OBESITY    History of Present Illness Carol Welch is a 48 year old female who presents for obesity treatment plan assessment and progress evaluation.  She is adhering to a 1000 calorie diet plan, rich in lean protein, fruits, and vegetables, approximately 80% of the time. She engages in walking for exercise two to three times a week for about half an hour. Despite these efforts, she has gained three pounds over the last two months. Recently, she has experienced increased hunger, which she attributes to stress, and describes a significant increase in snacking, which her daughter has also noticed.  She reports feeling 'kind of sick' for the past two days, with severe pain and swelling starting in the right upper abdomen and spreading to her back, arms, and legs. She visited urgent care, where blood work was performed, and she was advised to have an ultrasound. Initial blood work results indicated normal liver enzymes and electrolytes.  She has a history of prediabetes and is currently being treated with metformin, for which she requests a refill. She also has a history of vitamin D deficiency and is taking vitamin D2 at a dose of 50,000 international units weekly, also requesting a refill.  She has a history of H. pylori infection, which was treated successfully three weeks  ago.  Additionally, she underwent a skin check due to a gene mutation, resulting in a biopsy that showed abnormal results, necessitating further surgical intervention.      PHYSICAL EXAM:  Blood pressure 108/71, pulse 65, temperature 98.1 F (36.7 C), height 5\' 2"  (1.575 m), weight 150 lb (68 kg), last menstrual period 12/19/2019, SpO2 99%. Body mass index is 27.44 kg/m.  DIAGNOSTIC DATA REVIEWED:  BMET    Component Value Date/Time   NA 138 02/27/2023 1153   NA 138 12/18/2022 1427   K 4.0 02/27/2023 1153   CL 107 02/27/2023 1153   CO2 25 02/27/2023 1153   GLUCOSE 94 02/27/2023 1153   BUN 17 02/27/2023 1153   BUN 14 12/18/2022 1427   CREATININE 0.61 02/27/2023 1153   CALCIUM 8.6 (L) 02/27/2023 1153   GFRNONAA >60 02/27/2023 1153   GFRAA >60 01/02/2020 1911   Lab Results  Component Value Date   HGBA1C 5.8 (H) 12/18/2022   HGBA1C 5.6 08/08/2021   Lab Results  Component Value Date   INSULIN 2.7 12/18/2022   INSULIN 5.7 08/08/2021   Lab Results  Component Value Date   TSH 3.440 12/18/2022   CBC    Component Value Date/Time   WBC 4.9 02/27/2023 1153   RBC 4.27 02/27/2023 1153   HGB 12.9 02/27/2023 1153   HGB 13.1 12/18/2022 1427   HCT 39.0 02/27/2023 1153   HCT 39.2 12/18/2022 1427   PLT 230 02/27/2023 1153   PLT 280 12/18/2022 1427   MCV 91.3 02/27/2023 1153   MCV 89 12/18/2022 1427  MCH 30.2 02/27/2023 1153   MCHC 33.1 02/27/2023 1153   RDW 13.8 02/27/2023 1153   RDW 13.0 12/18/2022 1427   Iron Studies No results found for: "IRON", "TIBC", "FERRITIN", "IRONPCTSAT" Lipid Panel     Component Value Date/Time   CHOL 251 (H) 12/18/2022 1427   TRIG 89 12/18/2022 1427   HDL 70 12/18/2022 1427   LDLCALC 166 (H) 12/18/2022 1427   Hepatic Function Panel     Component Value Date/Time   PROT 7.3 12/18/2022 1427   ALBUMIN 4.3 12/18/2022 1427   AST 13 12/18/2022 1427   ALT 7 12/18/2022 1427   ALKPHOS 73 12/18/2022 1427   BILITOT 0.3 12/18/2022 1427    BILIDIR <0.10 05/29/2021 1035   IBILI NOT CALCULATED 03/13/2021 1415      Component Value Date/Time   TSH 3.440 12/18/2022 1427   Nutritional Lab Results  Component Value Date   VD25OH 41.4 12/18/2022   VD25OH 49.7 07/17/2022   VD25OH 48.0 02/11/2022     Assessment and Plan Assessment & Plan Right Upper Quadrant Pain Severe pain and swelling in the right upper quadrant, radiating to the back, arms, and legs. Urgent care suggested possible liver or gallbladder issues. Blood work showed normal liver enzymes and electrolytes. Ultrasound pending for further evaluation. Symptoms likely related to gallbladder issues. - Await ultrasound results to evaluate liver and gallbladder  Emotional Eating Behaviors Increased snacking and emotional eating due to elevated stress. Adheres to a 1000 calorie diet plan 80% of the time and exercises by walking 2-3 times a week. Discussed medication to reduce emotional eating by increasing certain stress neurotransmitters, with mild antidepressant effects. Common side effect includes dry mouth. Suitable candidate due to no history of kidney stones or seizure disorder. - Prescribe medication to reduce emotional eating behaviors - Advise taking medication in the morning to avoid interference with sleep - Discuss potential side effects, including dry mouth - Plan follow-up in four weeks to assess medication efficacy  Prediabetes Prediabetes managed with metformin, diet, and exercise. Requests metformin refill. - Refill metformin prescription  Vitamin D Deficiency Vitamin D deficiency managed with vitamin D2 50,000 IU weekly. Requests refill. - Refill vitamin D2 prescription  Obesity Struggling with weight regain due to EEB -Continue Cat 1 eating plan and follow up in 4 weeks   She was informed of the importance of frequent follow up visits to maximize her success with intensive lifestyle modifications for her multiple health conditions.    Jasmine Mesi, MD

## 2023-08-19 DIAGNOSIS — R1011 Right upper quadrant pain: Secondary | ICD-10-CM | POA: Diagnosis not present

## 2023-08-19 DIAGNOSIS — R110 Nausea: Secondary | ICD-10-CM | POA: Diagnosis not present

## 2023-08-20 DIAGNOSIS — F331 Major depressive disorder, recurrent, moderate: Secondary | ICD-10-CM | POA: Diagnosis not present

## 2023-08-27 ENCOUNTER — Encounter: Payer: Self-pay | Admitting: Obstetrics and Gynecology

## 2023-08-27 ENCOUNTER — Other Ambulatory Visit: Payer: Self-pay | Admitting: Obstetrics and Gynecology

## 2023-08-27 DIAGNOSIS — N6310 Unspecified lump in the right breast, unspecified quadrant: Secondary | ICD-10-CM

## 2023-08-27 DIAGNOSIS — Z1231 Encounter for screening mammogram for malignant neoplasm of breast: Secondary | ICD-10-CM

## 2023-08-28 DIAGNOSIS — F331 Major depressive disorder, recurrent, moderate: Secondary | ICD-10-CM | POA: Diagnosis not present

## 2023-09-04 ENCOUNTER — Ambulatory Visit

## 2023-09-04 ENCOUNTER — Ambulatory Visit
Admission: RE | Admit: 2023-09-04 | Discharge: 2023-09-04 | Disposition: A | Discharge status: Home / Self Care | Source: Ambulatory Visit | Attending: Obstetrics and Gynecology | Admitting: Obstetrics and Gynecology

## 2023-09-04 DIAGNOSIS — F331 Major depressive disorder, recurrent, moderate: Secondary | ICD-10-CM | POA: Diagnosis not present

## 2023-09-04 DIAGNOSIS — D241 Benign neoplasm of right breast: Secondary | ICD-10-CM | POA: Diagnosis not present

## 2023-09-04 DIAGNOSIS — D242 Benign neoplasm of left breast: Secondary | ICD-10-CM | POA: Diagnosis not present

## 2023-09-04 DIAGNOSIS — N6310 Unspecified lump in the right breast, unspecified quadrant: Secondary | ICD-10-CM

## 2023-09-05 ENCOUNTER — Ambulatory Visit

## 2023-09-09 ENCOUNTER — Encounter: Payer: Self-pay | Admitting: Dermatology

## 2023-09-09 DIAGNOSIS — Z803 Family history of malignant neoplasm of breast: Secondary | ICD-10-CM | POA: Diagnosis not present

## 2023-09-09 DIAGNOSIS — Z1389 Encounter for screening for other disorder: Secondary | ICD-10-CM | POA: Diagnosis not present

## 2023-09-09 DIAGNOSIS — Z01419 Encounter for gynecological examination (general) (routine) without abnormal findings: Secondary | ICD-10-CM | POA: Diagnosis not present

## 2023-09-09 DIAGNOSIS — Z113 Encounter for screening for infections with a predominantly sexual mode of transmission: Secondary | ICD-10-CM | POA: Diagnosis not present

## 2023-09-09 DIAGNOSIS — N898 Other specified noninflammatory disorders of vagina: Secondary | ICD-10-CM | POA: Diagnosis not present

## 2023-09-09 DIAGNOSIS — Z130 Encounter for screening for diseases of the blood and blood-forming organs and certain disorders involving the immune mechanism: Secondary | ICD-10-CM | POA: Diagnosis not present

## 2023-09-10 DIAGNOSIS — F331 Major depressive disorder, recurrent, moderate: Secondary | ICD-10-CM | POA: Diagnosis not present

## 2023-09-11 ENCOUNTER — Encounter: Payer: Self-pay | Admitting: Dermatology

## 2023-09-11 ENCOUNTER — Ambulatory Visit: Admitting: Dermatology

## 2023-09-11 VITALS — BP 103/74 | HR 64 | Temp 98.9°F

## 2023-09-11 DIAGNOSIS — Z1509 Genetic susceptibility to other malignant neoplasm: Secondary | ICD-10-CM

## 2023-09-11 DIAGNOSIS — D239 Other benign neoplasm of skin, unspecified: Secondary | ICD-10-CM

## 2023-09-11 DIAGNOSIS — D234 Other benign neoplasm of skin of scalp and neck: Secondary | ICD-10-CM | POA: Diagnosis not present

## 2023-09-11 DIAGNOSIS — Z1501 Genetic susceptibility to malignant neoplasm of breast: Secondary | ICD-10-CM

## 2023-09-11 NOTE — Patient Instructions (Signed)

## 2023-09-11 NOTE — Progress Notes (Signed)
 Follow-Up Visit   Subjective  Carol Welch is a 48 y.o. female who presents for the following: Excision of a DN moderate to severe on the left upper neck, biopsied by Dr. Myrtie Atkinson. She is accompanied by her husband. Patient has a monoallelic mutation of CDKN2A gene which puts her at high risk of MM.  The following portions of the chart were reviewed this encounter and updated as appropriate: medications, allergies, medical history  Review of Systems:  No other skin or systemic complaints except as noted in HPI or Assessment and Plan.  Objective  Well appearing patient in no apparent distress; mood and affect are within normal limits.  A focused examination was performed of the following areas: Left upper neck Relevant physical exam findings are noted in the Assessment and Plan.   Left upper Neck Healing biopsy site    Assessment & Plan   DYSPLASTIC NEVUS Left upper Neck Skin excision - Left upper Neck  Excision method:  elliptical Lesion length (cm):  1.1 Lesion width (cm):  0.5 Margin per side (cm):  0.5 Total excision diameter (cm):  2.1 Informed consent: discussed and consent obtained   Timeout: patient name, date of birth, surgical site, and procedure verified   Procedure prep:  Patient was prepped and draped in usual sterile fashion Prep type:  Chlorhexidine Anesthesia: the lesion was anesthetized in a standard fashion   Anesthetic:  1% lidocaine  w/ epinephrine 1-100,000 buffered w/ 8.4% NaHCO3 Instrument used: #15 blade   Hemostasis achieved with: suture, pressure and electrodesiccation   Outcome: patient tolerated procedure well with no complications   Post-procedure details: sterile dressing applied and wound care instructions given   Dressing type: bandage, petrolatum and pressure dressing    Skin repair - Left upper Neck Complexity:  Complex Final length (cm):  5 Informed consent: discussed and consent obtained   Timeout: patient name, date of birth,  surgical site, and procedure verified   Procedure prep:  Patient was prepped and draped in usual sterile fashion Prep type:  Chlorhexidine Anesthesia: the lesion was anesthetized in a standard fashion   Anesthetic:  1% lidocaine  w/ epinephrine 1-100,000 buffered w/ 8.4% NaHCO3 Reason for type of repair: reduce tension to allow closure and preserve normal anatomy   Undermining: area extensively undermined   Subcutaneous layers (deep stitches):  Suture size:  5-0 Suture type: Monocryl (poliglecaprone 25)   Stitches:  Buried vertical mattress Fine/surface layer approximation (top stitches):  Suture size:  6-0 Suture type: fast-absorbing plain gut   Stitches: simple running   Hemostasis achieved with: suture, pressure and electrodesiccation Outcome: patient tolerated procedure well with no complications   Post-procedure details: sterile dressing applied and wound care instructions given   Dressing type: bandage and pressure dressing   Specimen 1 - Surgical pathology Differential Diagnosis: DN [id number] Check Margins: Yes MONOALLELIC MUTATION OF CDKN2A GENE    The surgical wound was then cleaned, prepped, and re-anesthetized as above. Wound edges were undermined extensively along at least one entire edge and at a distance equal to or greater than the width of the defect (see wound defect size above) in order to achieve closure and decrease wound tension and anatomic distortion. Redundant tissue repair including standing cone removal was performed. Hemostasis was achieved with electrocautery. Subcutaneous and epidermal tissues were approximated with the above sutures. The surgical site was then lightly scrubbed with sterile, saline-soaked gauze. The area was then bandaged using Vaseline ointment, non-adherent gauze, gauze pads, and tape to provide an adequate pressure  dressing. The patient tolerated the procedure well, was given detailed written and verbal wound care instructions, and was  discharged in good condition.   The patient will follow-up: PRN.  CDKN2A Mutation The patient was counseled regarding the CDKN2A mutation, which has been identified in their genetic testing results. This mutation is associated with an increased risk of developing melanoma, as well as other cancers such as pancreatic cancer. The CDKN2A gene plays a crucial role in regulating cell cycle progression, and mutations in this gene can lead to uncontrolled cell growth, increasing the likelihood of tumor formation. We discussed the importance of heightened vigilance in monitoring for melanoma and other cancers. The patient was advised to have regular, comprehensive skin checks by a dermatologist, as well as to perform monthly self-examinations. In addition, the patient should be aware of any new or changing moles or lesions and report them immediately. Sun protection strategies, including daily use of broad-spectrum sunscreen, protective clothing, and avoiding tanning beds, were emphasized to minimize further skin cancer risks. Genetic counseling was also recommended to further explore the implications of this mutation, including potential testing for family members and more personalized screening plans. Finally, the patient was informed of potential options for more aggressive cancer screening, especially for pancreatic cancer, based on family history and other risk factors. Return in about 4 weeks (around 10/09/2023).  I, Haig Levan, Surg Tech III, am acting as scribe for Deneise Finlay, MD.   Documentation: I have reviewed the above documentation for accuracy and completeness, and I agree with the above.  Deneise Finlay, MD

## 2023-09-15 DIAGNOSIS — Z419 Encounter for procedure for purposes other than remedying health state, unspecified: Secondary | ICD-10-CM | POA: Diagnosis not present

## 2023-09-16 ENCOUNTER — Ambulatory Visit: Payer: Self-pay | Admitting: Dermatology

## 2023-09-16 LAB — SURGICAL PATHOLOGY

## 2023-09-17 DIAGNOSIS — F331 Major depressive disorder, recurrent, moderate: Secondary | ICD-10-CM | POA: Diagnosis not present

## 2023-09-23 ENCOUNTER — Other Ambulatory Visit (INDEPENDENT_AMBULATORY_CARE_PROVIDER_SITE_OTHER): Payer: Self-pay | Admitting: Family Medicine

## 2023-09-24 DIAGNOSIS — F331 Major depressive disorder, recurrent, moderate: Secondary | ICD-10-CM | POA: Diagnosis not present

## 2023-09-25 ENCOUNTER — Other Ambulatory Visit (INDEPENDENT_AMBULATORY_CARE_PROVIDER_SITE_OTHER): Payer: Self-pay | Admitting: Family Medicine

## 2023-09-25 ENCOUNTER — Ambulatory Visit (INDEPENDENT_AMBULATORY_CARE_PROVIDER_SITE_OTHER): Admitting: Family Medicine

## 2023-09-25 ENCOUNTER — Encounter (INDEPENDENT_AMBULATORY_CARE_PROVIDER_SITE_OTHER): Payer: Self-pay | Admitting: Family Medicine

## 2023-09-25 VITALS — BP 94/62 | HR 70 | Temp 98.1°F | Ht 62.0 in | Wt 146.0 lb

## 2023-09-25 DIAGNOSIS — Z6826 Body mass index (BMI) 26.0-26.9, adult: Secondary | ICD-10-CM | POA: Diagnosis not present

## 2023-09-25 DIAGNOSIS — E559 Vitamin D deficiency, unspecified: Secondary | ICD-10-CM | POA: Diagnosis not present

## 2023-09-25 DIAGNOSIS — R7303 Prediabetes: Secondary | ICD-10-CM

## 2023-09-25 DIAGNOSIS — F5089 Other specified eating disorder: Secondary | ICD-10-CM | POA: Diagnosis not present

## 2023-09-25 DIAGNOSIS — E669 Obesity, unspecified: Secondary | ICD-10-CM

## 2023-09-25 DIAGNOSIS — Z6830 Body mass index (BMI) 30.0-30.9, adult: Secondary | ICD-10-CM

## 2023-09-25 DIAGNOSIS — F3289 Other specified depressive episodes: Secondary | ICD-10-CM

## 2023-09-25 MED ORDER — ERGOCALCIFEROL 1.25 MG (50000 UT) PO CAPS: 50000.0000 [IU] | ORAL_CAPSULE | ORAL | 0 refills | Status: DC

## 2023-09-25 MED ORDER — METFORMIN HCL 500 MG PO TABS: 500.0000 mg | ORAL_TABLET | Freq: Every day | ORAL | 0 refills | Status: DC

## 2023-09-25 MED ORDER — BUPROPION HCL ER (SR) 150 MG PO TB12: 150.0000 mg | ORAL_TABLET | Freq: Every day | ORAL | 0 refills | Status: DC

## 2023-09-25 NOTE — Progress Notes (Signed)
 Office: [phone]  /  Fax: [phone]  WEIGHT SUMMARY AND BIOMETRICS  Anthropometric Measurements Height: 5\' 2"  (1.575 m) Weight: 146 lb (66.2 kg) BMI (Calculated): 26.7 Weight at Last Visit: 150 lb Weight Lost Since Last Visit: 4 lb Weight Gained Since Last Visit: 0 Starting Weight: 167 lb Total Weight Loss (lbs): 21 lb (9.526 kg) Peak Weight: 208 lb   Body Composition  Body Fat %: 32.3 % Fat Mass (lbs): 47.2 lbs Muscle Mass (lbs): 94 lbs Total Body Water (lbs): 62.8 lbs Visceral Fat Rating : 6   Other Clinical Data Fasting: no Labs: no Today's Visit #: 24 Starting Date: 08/08/21    Chief Complaint: OBESITY   History of Present Illness Carol Welch is a 48 year old female who presents for obesity treatment plan assessment and progress evaluation.  She has been adhering to a category one eating plan 90% of the time and engages in physical activity by walking twice a week for about 45 minutes. She has successfully lost four pounds in the last month since her last visit.  She has a history of prediabetes, currently managed with diet, exercise, weight loss, and metformin . Her most recent hemoglobin A1c level was 5.8, and she is due for lab work soon to monitor her blood sugar levels.  She has a history of emotional eating behaviors and is being treated with Wellbutrin  and Lexapro . Her stress level is much better, and her eating is very controllable. Spending time outside with her kids helps when she feels the medication is wearing off.  She also has a history of vitamin D  deficiency for which she is on prescription vitamin D .      PHYSICAL EXAM:  Blood pressure 94/62, pulse 70, temperature 98.1 F (36.7 C), height 5\' 2"  (1.575 m), weight 146 lb (66.2 kg), last menstrual period 12/19/2019, SpO2 98%. Body mass index is 26.7 kg/m.  DIAGNOSTIC DATA REVIEWED:  BMET    Component Value Date/Time   NA 138 02/27/2023 1153   NA 138 12/18/2022 1427   K 4.0  02/27/2023 1153   CL 107 02/27/2023 1153   CO2 25 02/27/2023 1153   GLUCOSE 94 02/27/2023 1153   BUN 17 02/27/2023 1153   BUN 14 12/18/2022 1427   CREATININE 0.61 02/27/2023 1153   CALCIUM  8.6 (L) 02/27/2023 1153   GFRNONAA >60 02/27/2023 1153   GFRAA >60 01/02/2020 1911   Lab Results  Component Value Date   HGBA1C 5.8 (H) 12/18/2022   HGBA1C 5.6 08/08/2021   Lab Results  Component Value Date   INSULIN  2.7 12/18/2022   INSULIN  5.7 08/08/2021   Lab Results  Component Value Date   TSH 3.440 12/18/2022   CBC    Component Value Date/Time   WBC 4.9 02/27/2023 1153   RBC 4.27 02/27/2023 1153   HGB 12.9 02/27/2023 1153   HGB 13.1 12/18/2022 1427   HCT 39.0 02/27/2023 1153   HCT 39.2 12/18/2022 1427   PLT 230 02/27/2023 1153   PLT 280 12/18/2022 1427   MCV 91.3 02/27/2023 1153   MCV 89 12/18/2022 1427   MCH 30.2 02/27/2023 1153   MCHC 33.1 02/27/2023 1153   RDW 13.8 02/27/2023 1153   RDW 13.0 12/18/2022 1427   Iron Studies No results found for: "IRON", "TIBC", "FERRITIN", "IRONPCTSAT" Lipid Panel     Component Value Date/Time   CHOL 251 (H) 12/18/2022 1427   TRIG 89 12/18/2022 1427   HDL 70 12/18/2022 1427   LDLCALC 166 (H) 12/18/2022 1427  Hepatic Function Panel     Component Value Date/Time   PROT 7.3 12/18/2022 1427   ALBUMIN 4.3 12/18/2022 1427   AST 13 12/18/2022 1427   ALT 7 12/18/2022 1427   ALKPHOS 73 12/18/2022 1427   BILITOT 0.3 12/18/2022 1427   BILIDIR <0.10 05/29/2021 1035   IBILI NOT CALCULATED 03/13/2021 1415      Component Value Date/Time   TSH 3.440 12/18/2022 1427   Nutritional Lab Results  Component Value Date   VD25OH 41.4 12/18/2022   VD25OH 49.7 07/17/2022   VD25OH 48.0 02/11/2022     Assessment and Plan Assessment & Plan Obesity Obesity is resolving with adherence to a category one eating plan 90% of the time and walking twice a week for 45 minutes. She has lost 4 pounds in the last month and aims to lose an additional  10 pounds to reach her desired weight range. Current weight is healthy based on fat percentage, visceral fat, and muscle mass. Further weight loss beyond 10 pounds may negatively affect health. - Continue category one eating plan. - Continue walking for exercise twice a week. - Aim to lose an additional 10 pounds.  Prediabetes Prediabetes managed with diet, exercise, weight loss, and metformin . Recent hemoglobin A1c was elevated at 5.8%. - Order labs to check hemoglobin A1c and other relevant parameters.  Emotional eating behaviors Emotional eating behaviors managed with Wellbutrin  and Lexapro . She reports improved stress levels and better control over eating behaviors. - Continue Wellbutrin  and Lexapro .  Vitamin D  deficiency Vitamin D  deficiency managed with prescription vitamin D . - Order labs to check vitamin D  levels.    She was informed of the importance of frequent follow up visits to maximize her success with intensive lifestyle modifications for her multiple health conditions.    Jasmine Mesi, MD

## 2023-09-26 LAB — CMP14+EGFR
ALT: 11 IU/L (ref 0–32)
AST: 15 IU/L (ref 0–40)
Albumin: 4.2 g/dL (ref 3.9–4.9)
Alkaline Phosphatase: 63 IU/L (ref 44–121)
BUN/Creatinine Ratio: 17 (ref 9–23)
BUN: 13 mg/dL (ref 6–24)
Bilirubin Total: 0.4 mg/dL (ref 0.0–1.2)
CO2: 19 mmol/L — ABNORMAL LOW (ref 20–29)
Calcium: 9.2 mg/dL (ref 8.7–10.2)
Chloride: 104 mmol/L (ref 96–106)
Creatinine, Ser: 0.77 mg/dL (ref 0.57–1.00)
Globulin, Total: 2.7 g/dL (ref 1.5–4.5)
Glucose: 87 mg/dL (ref 70–99)
Potassium: 4.5 mmol/L (ref 3.5–5.2)
Sodium: 139 mmol/L (ref 134–144)
Total Protein: 6.9 g/dL (ref 6.0–8.5)
eGFR: 96 mL/min/{1.73_m2} (ref >59)

## 2023-09-26 LAB — VITAMIN D 25 HYDROXY (VIT D DEFICIENCY, FRACTURES): Vit D, 25-Hydroxy: 42.7 ng/mL (ref 30.0–100.0)

## 2023-09-26 LAB — INSULIN, RANDOM: INSULIN: 5.8 u[IU]/mL (ref 2.6–24.9)

## 2023-09-26 LAB — HEMOGLOBIN A1C
Est. average glucose Bld gHb Est-mCnc: 114 mg/dL
Hgb A1c MFr Bld: 5.6 % (ref 4.8–5.6)

## 2023-10-01 DIAGNOSIS — F331 Major depressive disorder, recurrent, moderate: Secondary | ICD-10-CM | POA: Diagnosis not present

## 2023-10-08 DIAGNOSIS — F331 Major depressive disorder, recurrent, moderate: Secondary | ICD-10-CM | POA: Diagnosis not present

## 2023-10-09 ENCOUNTER — Ambulatory Visit: Admitting: Dermatology

## 2023-10-09 ENCOUNTER — Encounter: Payer: Self-pay | Admitting: Dermatology

## 2023-10-09 VITALS — BP 99/74 | HR 61

## 2023-10-09 DIAGNOSIS — L905 Scar conditions and fibrosis of skin: Secondary | ICD-10-CM

## 2023-10-09 DIAGNOSIS — L539 Erythematous condition, unspecified: Secondary | ICD-10-CM | POA: Diagnosis not present

## 2023-10-09 DIAGNOSIS — D239 Other benign neoplasm of skin, unspecified: Secondary | ICD-10-CM

## 2023-10-09 DIAGNOSIS — Z86018 Personal history of other benign neoplasm: Secondary | ICD-10-CM | POA: Diagnosis not present

## 2023-10-09 NOTE — Patient Instructions (Addendum)

## 2023-10-09 NOTE — Progress Notes (Signed)
   Follow Up Visit   Subjective  Carol Welch is a 48 y.o. female who presents for the following: follow up from [facility].  The patient presents for follow up from excision for a DN on the left upper neck, treated on 09/11/23, repaired with linear closure. The patient has been bandaging the wound as directed. The endorse the following concerns: none; accompanied by daughter.  The following portions of the chart were reviewed this encounter and updated as appropriate: medications, allergies, medical history  Review of Systems:  No other skin or systemic complaints except as noted in HPI or Assessment and Plan.  Objective  Well appearing patient in no apparent distress; mood and affect are within normal limits.  A full examination was performed including scalp, head, face and left upper neck. All findings within normal limits unless otherwise noted below.  Healing wound with mild erythema  Relevant physical exam findings are noted in the Assessment and Plan.      Assessment & Plan   Healing s/p excision for DN, treated on 09/11/23, repaired with linear closure. - Reassured that wound is healing well - No evidence of infection - No swelling, induration, purulence, dehiscence, or tenderness out of proportion to the clinical exam, see photo above - Discussed that scars take up to 12 months to mature from the date of surgery - Recommend SPF 30+ to scar daily to prevent purple color from UV exposure during scar maturation process - Discussed that erythema and raised appearance of scar will fade over the next 4-6 months - OK to start scar massage at 4-6 weeks post-op - Can consider silicone based products for scar healing starting at 6 weeks post-op - Ok to continue ointment daily to wound under a bandage for another week  HISTORY OF DYSPLASTIC NEVUS No evidence of recurrence today Recommend regular full body skin exams Recommend daily broad spectrum sunscreen SPF 30+ to sun-exposed areas,  reapply every 2 hours as needed.  Call if any new or changing lesions are noted between office visits   Return if symptoms worsen or fail to improve.  I, Wilson Hasten, CMA, am acting as scribe for Deneise Finlay, MD.   Documentation: I have reviewed the above documentation for accuracy and completeness, and I agree with the above.  Deneise Finlay, MD

## 2023-10-13 ENCOUNTER — Ambulatory Visit: Admitting: Family Medicine

## 2023-10-13 ENCOUNTER — Encounter: Payer: Self-pay | Admitting: Family Medicine

## 2023-10-13 VITALS — BP 111/69 | HR 63 | Ht 62.0 in | Wt 156.6 lb

## 2023-10-13 DIAGNOSIS — H5712 Ocular pain, left eye: Secondary | ICD-10-CM

## 2023-10-13 NOTE — Patient Instructions (Addendum)
 It was wonderful to see you today! Thank you for choosing [facility].   Please bring ALL of your medications with you to every visit.   Today we talked about:  I am referring you to the ophthalmologist to be seen in the next couple days regarding the pain in your left eye.  I will place the referral urgently so you can be seen in the next couple of days.  Please avoid putting anything on your eye.  You can take Tylenol  for the discomfort.  They may also be able to help you with your vision difficulties but you may also need to see an optometrist for this as they go more with glasses.  If your symptoms get significantly worse and you develop very blurry vision, drainage from the eye or severe headache that will not go away with medication please be seen in the ED.  Please follow up as needed for persistent symptoms.  Call the clinic at [phone] if your symptoms worsen or you have any concerns.  Please be sure to schedule follow up at the front desk before you leave today.   Jonne Netters, DO Family Medicine

## 2023-10-13 NOTE — Progress Notes (Signed)
    SUBJECTIVE:   CHIEF COMPLAINT / HPI:   Headache, left eye pain Headache began on Saturday.  She then developed pain in her left eye that has been worsening.  Pain with eye movement.  Noticed one area was red and has now developed into redness over the entire middle part of her left eye.  Reports improvement in the area.  Some burning with make her vision blurry but has no difficulty seeing.  Does not wear contacts.  Using eyedrops for relief.  Denies any similar prior events.  PERTINENT  PMH / PSH: None pertinent  OBJECTIVE:   BP 111/69   Pulse 63   Ht 5\' 2"  (1.575 m)   Wt 156 lb 9.6 oz (71 kg)   LMP 12/19/2019   SpO2 100%   BMI 28.64 kg/m    General: NAD, pleasant, able to participate in exam HEENT: Erythema extending from 12 o'clock position to 6 o'clock position on the medial side of the left eye, iris sparing.  Placed vessel extending along the central medial surface to the iris with outpouching noted.  Pupillary reflex intact.  Full EOMI.  No palpable cervical lymphadenopathy.  Nontender with palpation of orbit bilaterally.  Visual acuity: L: 20/30 R: 20/20 Both: 20/20  ASSESSMENT/PLAN:   Assessment & Plan Left eye pain Unclear etiology but given headache and pain with eye movement will refer urgently to ophthalmology for evaluation.  Mildly decreased visual acuity in left eye as above. -Urgent referral to ophthalmology, messaged referral coordinator -Supportive care with Tylenol  and advised against putting anything in the eye   Dr. Jonne Netters, DO [facility]

## 2023-10-14 DIAGNOSIS — H1132 Conjunctival hemorrhage, left eye: Secondary | ICD-10-CM | POA: Diagnosis not present

## 2023-10-16 DIAGNOSIS — Z419 Encounter for procedure for purposes other than remedying health state, unspecified: Secondary | ICD-10-CM | POA: Diagnosis not present

## 2023-10-22 ENCOUNTER — Other Ambulatory Visit (INDEPENDENT_AMBULATORY_CARE_PROVIDER_SITE_OTHER): Payer: Self-pay | Admitting: Family Medicine

## 2023-10-22 DIAGNOSIS — F3289 Other specified depressive episodes: Secondary | ICD-10-CM

## 2023-10-22 DIAGNOSIS — F331 Major depressive disorder, recurrent, moderate: Secondary | ICD-10-CM | POA: Diagnosis not present

## 2023-10-25 ENCOUNTER — Other Ambulatory Visit (INDEPENDENT_AMBULATORY_CARE_PROVIDER_SITE_OTHER): Payer: Self-pay | Admitting: Family Medicine

## 2023-10-25 DIAGNOSIS — E559 Vitamin D deficiency, unspecified: Secondary | ICD-10-CM

## 2023-10-25 DIAGNOSIS — R7303 Prediabetes: Secondary | ICD-10-CM

## 2023-10-30 ENCOUNTER — Ambulatory Visit (INDEPENDENT_AMBULATORY_CARE_PROVIDER_SITE_OTHER): Admitting: Family Medicine

## 2023-10-30 ENCOUNTER — Encounter (INDEPENDENT_AMBULATORY_CARE_PROVIDER_SITE_OTHER): Payer: Self-pay

## 2023-11-11 DIAGNOSIS — F331 Major depressive disorder, recurrent, moderate: Secondary | ICD-10-CM | POA: Diagnosis not present

## 2023-11-15 DIAGNOSIS — Z419 Encounter for procedure for purposes other than remedying health state, unspecified: Secondary | ICD-10-CM | POA: Diagnosis not present

## 2023-12-02 ENCOUNTER — Ambulatory Visit (INDEPENDENT_AMBULATORY_CARE_PROVIDER_SITE_OTHER): Admitting: Family Medicine

## 2023-12-16 DIAGNOSIS — Z419 Encounter for procedure for purposes other than remedying health state, unspecified: Secondary | ICD-10-CM | POA: Diagnosis not present

## 2023-12-17 ENCOUNTER — Ambulatory Visit (INDEPENDENT_AMBULATORY_CARE_PROVIDER_SITE_OTHER): Admitting: Family Medicine

## 2023-12-24 NOTE — Progress Notes (Unsigned)
 Office: [phone]  /  Fax: [phone]  WEIGHT SUMMARY AND BIOMETRICS  Weight Lost Since Last Visit: 0  Weight Gained Since Last Visit: 6 lb   Vitals Temp: 98.2 F (36.8 C) BP: 111/73 Pulse Rate: 62 SpO2: 100 %   Anthropometric Measurements Height: 5' 2 (1.575 m) Weight: 152 lb (68.9 kg) BMI (Calculated): 27.79 Weight at Last Visit: 146 lb Weight Lost Since Last Visit: 0 Weight Gained Since Last Visit: 6 lb Starting Weight: 167 lb Total Weight Loss (lbs): 15 lb (6.804 kg)   Body Composition  Body Fat %: 33.9 % Fat Mass (lbs): 51.6 lbs Muscle Mass (lbs): 95.2 lbs Total Body Water (lbs): 65.4 lbs Visceral Fat Rating : 7   Other Clinical Data Starting Date: 08/08/21   Today's Weight : Gain of 6 pounds from last visit Total Weight Loss:15 Pounds PBW LOST: 8.9%  HPI  Chief Complaint: OBESITY  Rhianne is here to discuss her progress with her obesity treatment plan. She is on the the Category 1 Plan and states she is following her eating plan approximately 70 % of the time. She states she is exercising 30-60 minutes 3-4 days per week.   Interval History:  Since last office visit she has noticed she has been snacking and grazing. She has been craving more salty . She has been out of medication for over a month. Her kids are back in school starting last week.  Her kids are 6,8,17.and had gotten less sleep with summer vacation. She does try to get  80-90 grams of protein daily. She had labwork at last visit and is anxious to review. Most recent HOMA-IR score  is 1.24.  She has a history of hypothyroidism which is currently well controlled with levothyroxine  75 mcg every day   She is currently on lexapro  20 mg every day and buproprion for depression with anxiety as well as buproprion helping control emotional eating behaviors.  She has a history of prediabetes and is currently on Metformin  500 mg every day and last A1c 09/2023 was 5.6. Also using Metformin  off  label for weight loss.   She also has history of Vitamin D  deficiency and is taking ergocalciferol  50000 units once a week. Last Vit D level 09/25/23 was 47.   Pharmacotherapy for weight loss: She is currently taking Wellbutrin  and Metformin  off label for medical weight loss.  Denies side effects.       PHYSICAL EXAM:  Blood pressure 111/73, pulse 62, temperature 98.2 F (36.8 C), height 5' 2 (1.575 m), weight 152 lb (68.9 kg), last menstrual period 12/19/2019, SpO2 100%. Body mass index is 27.8 kg/m.  General: She is overweight, cooperative, alert, well developed, and in no acute distress. PSYCH: Has normal mood, affect and thought process.   Extremities: No edema.  Neurologic: No gross sensory or motor deficits. No tremors or fasciculations noted.    DIAGNOSTIC DATA REVIEWED:  BMET    Component Value Date/Time   NA 139 09/25/2023 1111   K 4.5 09/25/2023 1111   CL 104 09/25/2023 1111   CO2 19 (L) 09/25/2023 1111   GLUCOSE 87 09/25/2023 1111   GLUCOSE 94 02/27/2023 1153   BUN 13 09/25/2023 1111   CREATININE 0.77 09/25/2023 1111   CALCIUM  9.2 09/25/2023 1111   GFRNONAA >60 02/27/2023 1153   GFRAA >60 01/02/2020 1911   Lab Results  Component Value Date   HGBA1C 5.6 09/25/2023   HGBA1C 5.6 08/08/2021   Lab Results  Component Value Date  INSULIN  5.8 09/25/2023   INSULIN  5.7 08/08/2021   Lab Results  Component Value Date   TSH 3.440 12/18/2022   CBC    Component Value Date/Time   WBC 4.9 02/27/2023 1153   RBC 4.27 02/27/2023 1153   HGB 12.9 02/27/2023 1153   HGB 13.1 12/18/2022 1427   HCT 39.0 02/27/2023 1153   HCT 39.2 12/18/2022 1427   PLT 230 02/27/2023 1153   PLT 280 12/18/2022 1427   MCV 91.3 02/27/2023 1153   MCV 89 12/18/2022 1427   MCH 30.2 02/27/2023 1153   MCHC 33.1 02/27/2023 1153   RDW 13.8 02/27/2023 1153   RDW 13.0 12/18/2022 1427   Iron Studies No results found for: IRON, TIBC, FERRITIN, IRONPCTSAT Lipid Panel     Component  Value Date/Time   CHOL 251 (H) 12/18/2022 1427   TRIG 89 12/18/2022 1427   HDL 70 12/18/2022 1427   LDLCALC 166 (H) 12/18/2022 1427   Hepatic Function Panel     Component Value Date/Time   PROT 6.9 09/25/2023 1111   ALBUMIN 4.2 09/25/2023 1111   AST 15 09/25/2023 1111   ALT 11 09/25/2023 1111   ALKPHOS 63 09/25/2023 1111   BILITOT 0.4 09/25/2023 1111   BILIDIR <0.10 05/29/2021 1035   IBILI NOT CALCULATED 03/13/2021 1415      Component Value Date/Time   TSH 3.440 12/18/2022 1427   Nutritional Lab Results  Component Value Date   VD25OH 42.7 09/25/2023   VD25OH 41.4 12/18/2022   VD25OH 49.7 07/17/2022     ASSESSMENT AND PLAN  TREATMENT PLAN FOR OBESITY:  Recommended Dietary Goals  Carol Welch is currently in the action stage of change. As such, her goal is to continue weight management plan. She has agreed to the Category 1 Plan.  Behavioral Intervention  We discussed the following Behavioral Modification Strategies today: better snacking choices, continue to work on maintaining a reduced calorie state, getting the recommended amount of protein, incorporating whole foods, making healthy choices, staying well hydrated and practicing mindfulness when eating., and getting back on track after recent relapse.  Additional resources provided today: 100 calorie snack ideas  Recommended Physical Activity Goals  Saryn has been advised to work up to 150 minutes of moderate intensity aerobic activity a week and strengthening exercises 2-3 times per week for cardiovascular health, weight loss maintenance and preservation of muscle mass. She is currently doing cardio approximately 180 minutes a week.   She has agreed to Continue current level of physical activity  and Increase physical activity in their day and reduce sedentary time (increase NEAT).   Pharmacotherapy We discussed various medication options to help Alysa with her weight loss efforts and we both agreed to continue  Metformin  and buproprion off label for weight loss .  ASSOCIATED CONDITIONS ADDRESSED TODAY  Action/Plan  Pre-diabetes All labs reviewed and A1c has decreased from 5.8 to 5.6 Continue Category 1 meal plan and focus on limiting simple carbs Continue focusing on weight loss -     metFORMIN  HCl; Take 1 tablet (500 mg total) by mouth daily with breakfast.  Dispense: 30 tablet; Refill: 0  Vitamin D  insufficiency Last Vit D level reviewed and is good at 42.7 on ergocalciferol  50000 units once a week Continue Vit D 50000 units once a week -     Ergocalciferol ; Take 1 capsule (50,000 Units total) by mouth once a week.  Dispense: 4 capsule; Refill: 0  Acquired hypothyroidism       Continue Levothyroxine  75 mcg  every day and follow with PCP  Depression with anxiety      Continue Lexapro  20 mg every day      Continue to focus on Category 1 meal plan, exercise and weight      Continue Buproprion SR 150 mg every day   Other depression, with emotional eating behavior -     buPROPion  HCl ER (SR); Take 1 tablet (150 mg total) by mouth daily.  Dispense: 30 tablet; Refill: 0  Obesity, starting BMI 30.54 Overweight with BMI of 27.0 to 27.9 in adult       Continue Category 1 meal plan       Continue exercise 60 minutes 3 times a week- will discuss adding strength training at next visit       Reviewed 100 calorie snack ideas with patient and given handout to help with snack ideas- especially [place] bars for her when family is having ice cream       Stressed importance of regular follow ups      Return in about 4 weeks (around 01/22/2024).SABRA She was informed of the importance of frequent follow up visits to maximize her success with intensive lifestyle modifications for her multiple health conditions.   ATTESTASTION STATEMENTS:  Reviewed by clinician on day of visit: allergies, medications, problem list, medical history, surgical history, family history, social history, and previous encounter  notes.   Time spent on visit including pre-visit chart review and post-visit care and charting was 40 minutes.   Syair Fricker ANP-C

## 2023-12-25 ENCOUNTER — Ambulatory Visit (INDEPENDENT_AMBULATORY_CARE_PROVIDER_SITE_OTHER): Admitting: Nurse Practitioner

## 2023-12-25 ENCOUNTER — Other Ambulatory Visit (INDEPENDENT_AMBULATORY_CARE_PROVIDER_SITE_OTHER): Payer: Self-pay | Admitting: Nurse Practitioner

## 2023-12-25 ENCOUNTER — Encounter (INDEPENDENT_AMBULATORY_CARE_PROVIDER_SITE_OTHER): Payer: Self-pay | Admitting: Nurse Practitioner

## 2023-12-25 VITALS — BP 111/73 | HR 62 | Temp 98.2°F | Ht 62.0 in | Wt 152.0 lb

## 2023-12-25 DIAGNOSIS — Z6827 Body mass index (BMI) 27.0-27.9, adult: Secondary | ICD-10-CM | POA: Diagnosis not present

## 2023-12-25 DIAGNOSIS — F5089 Other specified eating disorder: Secondary | ICD-10-CM

## 2023-12-25 DIAGNOSIS — E66811 Obesity, class 1: Secondary | ICD-10-CM

## 2023-12-25 DIAGNOSIS — R7303 Prediabetes: Secondary | ICD-10-CM | POA: Diagnosis not present

## 2023-12-25 DIAGNOSIS — E559 Vitamin D deficiency, unspecified: Secondary | ICD-10-CM

## 2023-12-25 DIAGNOSIS — F3289 Other specified depressive episodes: Secondary | ICD-10-CM | POA: Diagnosis not present

## 2023-12-25 DIAGNOSIS — E039 Hypothyroidism, unspecified: Secondary | ICD-10-CM

## 2023-12-25 DIAGNOSIS — E663 Overweight: Secondary | ICD-10-CM

## 2023-12-25 DIAGNOSIS — F418 Other specified anxiety disorders: Secondary | ICD-10-CM

## 2023-12-25 DIAGNOSIS — F331 Major depressive disorder, recurrent, moderate: Secondary | ICD-10-CM | POA: Diagnosis not present

## 2023-12-25 MED ORDER — METFORMIN HCL 500 MG PO TABS: 500.0000 mg | ORAL_TABLET | Freq: Every day | ORAL | 0 refills | Status: DC

## 2023-12-25 MED ORDER — BUPROPION HCL ER (SR) 150 MG PO TB12: 150.0000 mg | ORAL_TABLET | Freq: Every day | ORAL | 0 refills | Status: DC

## 2023-12-25 MED ORDER — ERGOCALCIFEROL 1.25 MG (50000 UT) PO CAPS: 50000.0000 [IU] | ORAL_CAPSULE | ORAL | 0 refills | Status: DC

## 2023-12-29 DIAGNOSIS — F331 Major depressive disorder, recurrent, moderate: Secondary | ICD-10-CM | POA: Diagnosis not present

## 2023-12-30 ENCOUNTER — Other Ambulatory Visit: Payer: Self-pay

## 2023-12-30 MED ORDER — LINACLOTIDE 145 MCG PO CAPS: 145.0000 ug | ORAL_CAPSULE | Freq: Every day | ORAL | 1 refills | Status: AC

## 2023-12-30 NOTE — Progress Notes (Signed)
 LINZESS  145 MCG REFILLED. PATIENT NEEDS AN APPOINTMENT. LAST SEEN 01-2023

## 2024-01-07 DIAGNOSIS — F331 Major depressive disorder, recurrent, moderate: Secondary | ICD-10-CM | POA: Diagnosis not present

## 2024-01-15 DIAGNOSIS — F331 Major depressive disorder, recurrent, moderate: Secondary | ICD-10-CM | POA: Diagnosis not present

## 2024-01-16 DIAGNOSIS — Z419 Encounter for procedure for purposes other than remedying health state, unspecified: Secondary | ICD-10-CM | POA: Diagnosis not present

## 2024-01-22 ENCOUNTER — Encounter (INDEPENDENT_AMBULATORY_CARE_PROVIDER_SITE_OTHER): Payer: Self-pay | Admitting: Nurse Practitioner

## 2024-01-22 ENCOUNTER — Ambulatory Visit (INDEPENDENT_AMBULATORY_CARE_PROVIDER_SITE_OTHER): Admitting: Nurse Practitioner

## 2024-01-22 VITALS — BP 105/69 | HR 59 | Temp 98.2°F | Ht 62.0 in | Wt 147.0 lb

## 2024-01-22 DIAGNOSIS — Z6827 Body mass index (BMI) 27.0-27.9, adult: Secondary | ICD-10-CM | POA: Diagnosis not present

## 2024-01-22 DIAGNOSIS — E559 Vitamin D deficiency, unspecified: Secondary | ICD-10-CM

## 2024-01-22 DIAGNOSIS — F5089 Other specified eating disorder: Secondary | ICD-10-CM | POA: Diagnosis not present

## 2024-01-22 DIAGNOSIS — R7303 Prediabetes: Secondary | ICD-10-CM | POA: Diagnosis not present

## 2024-01-22 DIAGNOSIS — F3289 Other specified depressive episodes: Secondary | ICD-10-CM | POA: Diagnosis not present

## 2024-01-22 DIAGNOSIS — E663 Overweight: Secondary | ICD-10-CM

## 2024-01-22 DIAGNOSIS — E039 Hypothyroidism, unspecified: Secondary | ICD-10-CM

## 2024-01-22 MED ORDER — METFORMIN HCL 500 MG PO TABS
500.0000 mg | ORAL_TABLET | Freq: Every day | ORAL | 0 refills | Status: DC
Start: 2024-01-22 — End: 2024-02-19

## 2024-01-22 MED ORDER — ERGOCALCIFEROL 1.25 MG (50000 UT) PO CAPS
50000.0000 [IU] | ORAL_CAPSULE | ORAL | 2 refills | Status: DC
Start: 2024-01-22 — End: 2024-03-30

## 2024-01-22 MED ORDER — BUPROPION HCL ER (SR) 150 MG PO TB12: 150.0000 mg | ORAL_TABLET | Freq: Every day | ORAL | 0 refills | Status: DC

## 2024-01-22 NOTE — Progress Notes (Signed)
 Office: [phone]  /  Fax: [phone]  WEIGHT SUMMARY AND BIOMETRICS  Weight Lost Since Last Visit: 5lb  Weight Gained Since Last Visit: 0lb   Vitals Temp: 98.2 F (36.8 C) BP: 105/69 Pulse Rate: (!) 59 SpO2: 99 %   Anthropometric Measurements Height: 5' 2 (1.575 m) Weight: 147 lb (66.7 kg) BMI (Calculated): 26.88 Weight at Last Visit: 152lb Weight Lost Since Last Visit: 5lb Weight Gained Since Last Visit: 0lb Starting Weight: 167lb Total Weight Loss (lbs): 20 lb (9.072 kg)   Body Composition  Body Fat %: 32.8 % Fat Mass (lbs): 48.2 lbs Muscle Mass (lbs): 93.8 lbs Total Body Water (lbs): 63.6 lbs Visceral Fat Rating : 6   Other Clinical Data Fasting: No Labs: No Today's Visit #: 26 Starting Date: 08/08/21    Total Weight Loss: 20 pounds Percent of body weight lost: 12 % [employer] Data: Down 1.4 pounds muscle, down 3.4 pounds of adipose  HPI  Chief Complaint: OBESITY  Carol Welch is here to discuss her progress with her obesity treatment plan. She is on the the Category 1 Plan and states she is following her eating plan approximately 100 % of the time. She states she is exercising 60 minutes 5-6 days per week.   Interval History:  Since last office visit she has been walking for 60 minutes 5-6 days a week. She has also been doing a lot of yard work. Cravings have much better with use of Buproprion and Metformin .  Normally getting at least 60 grams of protein and is sticking to category 1 meal plan, getting 40 ounces of water, drinks rice coffee in the am, protein shake. She has been tracking on [employer]!   Pharmacotherapy for weight loss: She is currently taking Metformin  for prediabetes, Buproprion for emotional eating and off label for medical weight loss.  Denies side effects.       PHYSICAL EXAM:  Blood pressure 105/69, pulse (!) 59, temperature 98.2 F (36.8 C), height 5' 2 (1.575 m), weight 147 lb (66.7 kg), last menstrual period  12/19/2019, SpO2 99%. Body mass index is 26.89 kg/m.  General: Well Developed, well nourished, and in no acute distress.  HEENT: Normocephalic, atraumatic; EOMI, sclerae are anicteric. Skin: Warm and dry, good turgor Chest:  Normal excursion, shape, no gross ABN Respiratory: No conversational dyspnea; speaking in full sentences NeuroM-Sk:  Normal gross ROM * 4 extremities  Psych: A and O X 3, insight adequate, mood- full    DIAGNOSTIC DATA REVIEWED:  BMET    Component Value Date/Time   NA 139 09/25/2023 1111   K 4.5 09/25/2023 1111   CL 104 09/25/2023 1111   CO2 19 (L) 09/25/2023 1111   GLUCOSE 87 09/25/2023 1111   GLUCOSE 94 02/27/2023 1153   BUN 13 09/25/2023 1111   CREATININE 0.77 09/25/2023 1111   CALCIUM  9.2 09/25/2023 1111   GFRNONAA >60 02/27/2023 1153   GFRAA >60 01/02/2020 1911   Lab Results  Component Value Date   HGBA1C 5.6 09/25/2023   HGBA1C 5.6 08/08/2021   Lab Results  Component Value Date   INSULIN  5.8 09/25/2023   INSULIN  5.7 08/08/2021   Lab Results  Component Value Date   TSH 3.440 12/18/2022   CBC    Component Value Date/Time   WBC 4.9 02/27/2023 1153   RBC 4.27 02/27/2023 1153   HGB 12.9 02/27/2023 1153   HGB 13.1 12/18/2022 1427   HCT 39.0 02/27/2023 1153   HCT 39.2 12/18/2022 1427   PLT 230  02/27/2023 1153   PLT 280 12/18/2022 1427   MCV 91.3 02/27/2023 1153   MCV 89 12/18/2022 1427   MCH 30.2 02/27/2023 1153   MCHC 33.1 02/27/2023 1153   RDW 13.8 02/27/2023 1153   RDW 13.0 12/18/2022 1427   Lipid Panel     Component Value Date/Time   CHOL 251 (H) 12/18/2022 1427   TRIG 89 12/18/2022 1427   HDL 70 12/18/2022 1427   LDLCALC 166 (H) 12/18/2022 1427   Hepatic Function Panel     Component Value Date/Time   PROT 6.9 09/25/2023 1111   ALBUMIN 4.2 09/25/2023 1111   AST 15 09/25/2023 1111   ALT 11 09/25/2023 1111   ALKPHOS 63 09/25/2023 1111   BILITOT 0.4 09/25/2023 1111   BILIDIR <0.10 05/29/2021 1035   IBILI NOT  CALCULATED 03/13/2021 1415      Component Value Date/Time   TSH 3.440 12/18/2022 1427   Nutritional Lab Results  Component Value Date   VD25OH 42.7 09/25/2023   VD25OH 41.4 12/18/2022   VD25OH 49.7 07/17/2022     ASSESSMENT AND PLAN  TREATMENT PLAN FOR OBESITY: Overweight with body mass index (BMI) of 27 to 27.9 in adult Recommended Dietary Goals  Carol Welch is currently in the action stage of change. As such, her goal is to continue weight management plan. She has agreed to the Category 1 Plan.  Behavioral Intervention  We discussed the following Behavioral Modification Strategies today: increasing water intake , continue to work on maintaining a reduced calorie state, getting the recommended amount of protein, incorporating whole foods, making healthy choices, staying well hydrated and practicing mindfulness when eating., and increase protein intake, fibrous foods (25 grams per day for women, 30 grams for men) and water to improve satiety and decrease hunger signals. .  Additional resources provided today: NA  Recommended Physical Activity Goals  Carol Welch has been advised to work up to 150 minutes of moderate intensity aerobic activity a week and strengthening exercises 2-3 times per week for cardiovascular health, weight loss maintenance and preservation of muscle mass.   She has agreed to Continue current level of physical activity  and Combine aerobic and strengthening exercises for efficiency and improved cardiometabolic health.   Pharmacotherapy We discussed various medication options to help Carol Welch with her weight loss efforts and we both agreed to continue Metformin  500 mg every day for prediabetes and buproprion for emotional eating as well as using medications off label for weight loss .  ASSOCIATED CONDITIONS ADDRESSED TODAY  Action/Plan  Pre-diabetes Continue Category 1 meal plan Focus on limiting simple carbs Continue exercise with a goal of 240 minutes of  moderate intensity exercise/week Will do fasting labwork next visit(A1c, insulin )_ -     metFORMIN  HCl; Take 1 tablet (500 mg total) by mouth daily with breakfast.  Dispense: 90 tablet; Refill: 0  Acquired hypothyroidism       Continue Levothyroxine  75 mcg every day       Will recheck TSH next visit   Other depression, with emotional eating behavior Continue Category 1 meal plan Continue exercise with a goal of 240 minutes of moderate intensity exercise/week Continue Buproprion ER 150 mg every day  -     buPROPion  HCl ER (SR); Take 1 tablet (150 mg total) by mouth daily.  Dispense: 90 tablet; Refill: 0  Vitamin D  insufficiency Continue ergocalciferol  once a week, recheck Vit D at next visit -     Ergocalciferol ; Take 1 capsule (50,000 Units total) by mouth once  a week.  Dispense: 4 capsule; Refill: 2         Return in about 4 weeks (around 02/19/2024).Carol Welch She was informed of the importance of frequent follow up visits to maximize her success with intensive lifestyle modifications for her multiple health conditions.   ATTESTASTION STATEMENTS:  Reviewed by clinician on day of visit: allergies, medications, problem list, medical history, surgical history, family history, social history, and previous encounter notes.     Rusty Glodowski ANP-C

## 2024-01-29 ENCOUNTER — Telehealth: Payer: Self-pay

## 2024-01-29 DIAGNOSIS — Z1501 Genetic susceptibility to malignant neoplasm of breast: Secondary | ICD-10-CM

## 2024-01-29 NOTE — Telephone Encounter (Signed)
 Order placed for MRCP. MyChart message to patient to expect call from scheduling to make an appointment in October. She can call [phone] to make an appointment. Message to schedulers to contact patient to schedule the appointment.

## 2024-01-29 NOTE — Telephone Encounter (Signed)
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

## 2024-02-03 ENCOUNTER — Ambulatory Visit (HOSPITAL_COMMUNITY)

## 2024-02-03 DIAGNOSIS — M779 Enthesopathy, unspecified: Secondary | ICD-10-CM | POA: Diagnosis not present

## 2024-02-03 DIAGNOSIS — M791 Myalgia, unspecified site: Secondary | ICD-10-CM | POA: Diagnosis not present

## 2024-02-03 DIAGNOSIS — M25511 Pain in right shoulder: Secondary | ICD-10-CM | POA: Diagnosis not present

## 2024-02-12 ENCOUNTER — Encounter: Payer: Self-pay | Admitting: Student

## 2024-02-12 ENCOUNTER — Other Ambulatory Visit: Payer: Self-pay | Admitting: Student

## 2024-02-12 ENCOUNTER — Ambulatory Visit (INDEPENDENT_AMBULATORY_CARE_PROVIDER_SITE_OTHER): Admitting: Student

## 2024-02-12 VITALS — BP 112/73 | HR 72 | Ht 62.0 in | Wt 152.8 lb

## 2024-02-12 DIAGNOSIS — H538 Other visual disturbances: Secondary | ICD-10-CM | POA: Diagnosis not present

## 2024-02-12 DIAGNOSIS — N6313 Unspecified lump in the right breast, lower outer quadrant: Secondary | ICD-10-CM

## 2024-02-12 NOTE — Patient Instructions (Addendum)
 It was great to see you! Thank you for allowing me to participate in your care!   I recommend that you always bring your medications to each appointment as this makes it easy to ensure we are on the correct medications and helps us  not miss when refills are needed.  Our plans for today:  - I have sent a diagnostic mammogram to the breast center, you can call the number below to schedule an appointment - I have sent a referral to ophthalmology, specifically [place] location   Take care and seek immediate care sooner if you develop any concerns. Please remember to show up 15 minutes before your scheduled appointment time!  Gladis Church, DO [facility]

## 2024-02-12 NOTE — Progress Notes (Signed)
    SUBJECTIVE:   CHIEF COMPLAINT / HPI:   Blurry vision - Ongoing for several years - Reports that she has been told she has glaucoma? - Needs referral to see her prior ophthalmologist - No significant findings to suggest urgent ophthalmology evaluation such as acute angle glaucoma/uveitis -Patient declined visual acuity test today  Right breast lump Patient reports new right sided breast lump.  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  Recent diagnostic mammogram in May was not concerning.    OBJECTIVE:   BP 112/73   Pulse 72   Ht 5' 2 (1.575 m)   Wt 152 lb 12.8 oz (69.3 kg)   LMP 12/19/2019   SpO2 98%   BMI 27.95 kg/m    General: NAD, pleasant Cardio: RRR, no MRG. Cap Refill <2s. Respiratory: CTAB, normal wob on RA Skin: Warm and dry  **Alexis CMA present during breast exam** Breast exam: Firm, none mobile approximately 2 cm circumferential mass at approximately 9:00 of right breast, with overlap between right upper/lower quadrant.  No skin change, no drainage.  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

## 2024-02-19 ENCOUNTER — Ambulatory Visit (INDEPENDENT_AMBULATORY_CARE_PROVIDER_SITE_OTHER): Admitting: Nurse Practitioner

## 2024-02-19 ENCOUNTER — Encounter (INDEPENDENT_AMBULATORY_CARE_PROVIDER_SITE_OTHER): Payer: Self-pay | Admitting: Nurse Practitioner

## 2024-02-19 VITALS — BP 104/72 | HR 70 | Temp 98.0°F | Ht 62.0 in | Wt 144.0 lb

## 2024-02-19 DIAGNOSIS — E559 Vitamin D deficiency, unspecified: Secondary | ICD-10-CM | POA: Diagnosis not present

## 2024-02-19 DIAGNOSIS — R7303 Prediabetes: Secondary | ICD-10-CM

## 2024-02-19 DIAGNOSIS — E039 Hypothyroidism, unspecified: Secondary | ICD-10-CM

## 2024-02-19 DIAGNOSIS — E663 Overweight: Secondary | ICD-10-CM

## 2024-02-19 DIAGNOSIS — E7800 Pure hypercholesterolemia, unspecified: Secondary | ICD-10-CM | POA: Diagnosis not present

## 2024-02-19 DIAGNOSIS — F418 Other specified anxiety disorders: Secondary | ICD-10-CM | POA: Diagnosis not present

## 2024-02-19 DIAGNOSIS — Z6826 Body mass index (BMI) 26.0-26.9, adult: Secondary | ICD-10-CM

## 2024-02-19 DIAGNOSIS — K760 Fatty (change of) liver, not elsewhere classified: Secondary | ICD-10-CM | POA: Diagnosis not present

## 2024-02-19 MED ORDER — METFORMIN HCL 500 MG PO TABS: 500.0000 mg | ORAL_TABLET | Freq: Every day | ORAL | 0 refills | Status: DC

## 2024-02-19 NOTE — Patient Instructions (Signed)
 Nonalcoholic Fatty Liver Disease Diet, Adult Nonalcoholic fatty liver disease is a condition that causes fat to build up in and around the liver. The disease makes it harder for the liver to work the way that it should. Eating a healthy diet of fruits, vegetables, whole grains, lean proteins, and limiting added sugar and fats can help to keep nonalcoholic fatty liver disease under control. It can also help to prevent or improve conditions that are related to the disease, such as heart disease, diabetes, high blood pressure, obesity, and high cholesterol. Along with regular exercise, this diet: Promotes weight loss. Helps to control blood sugar levels. Helps to improve the way that the body uses insulin. What are tips for following this plan? Reading food labels Always check food labels for: The amount of saturated fat in a food. You should limit how much saturated fat you eat. Saturated fat is found in foods that come from animals, including meat and dairy products such as butter, cheese, and whole milk. The amount of fiber in a food. You should choose high-fiber foods such as fruits, vegetables, and whole grains. Try to get 25-30 grams (g) of fiber a day. Added sugar. Avoid foods with a high amount of added sugar and high fructose corn syrup. Avoid sweetened soft drinks, sweetened tea, lemonade, sports drinks, and juices that are not 100% juice. Aim for foods with less than 5 grams of added sugar. Every 4 grams of added sugar is 1 teaspoon (tsp) of sugar per serving. Cooking When cooking, use heart-healthy oils that are high in monounsaturated fats. These include olive oil, canola oil, and avocado oil. Limit frying or deep-frying foods. Cook foods using healthy methods such as baking, boiling, steaming, and grilling instead. Meal planning You may want to keep track of how many calories you eat and drink. Eating the right amount of calories will help you achieve a healthy weight. Meeting with a  dietitian can help you get started. Limit how often you eat takeout and fast food. These foods are usually very high in fat, salt, and sugar. Use the glycemic index (GI) to plan your meals. The index tells you how quickly a food will raise your blood sugar. Choose low-GI foods. Low-GI foods have a GI less than 55. These foods take a longer time to raise blood sugar. A dietitian can help you pick foods that are lower on the GI scale. Try to include some meals each week that replace meat with beans or legumes. Add fish 2-3 times a week, especially heart healthy oily fishes like salmon, sardines, trout, tuna, or mackerel. Lifestyle You may want to follow a Mediterranean diet. This diet includes a lot of vegetables, lean meats or fish, nuts and seeds, whole grains, fruits, and healthy oils and fats. What foods can I eat?  Fruits Apples. Bananas. Pears. Grapes. Papaya. Plums. Kiwi. Grapefruit. Cherries. Strawberries. Vegetables Lettuce. Spinach. Peas. Beets. Cauliflower. Cabbage. Broccoli. Carrots. Tomatoes. Squash. Eggplant. Herbs. Peppers. Onions. Cucumbers. Brussels sprouts. Yams and sweet potatoes. Grains Whole wheat or whole-grain foods, including breads, crackers, cereals, and pasta. Stone-ground whole wheat. Unsweetened oatmeal. Bulgur. Barley. Quinoa. Brown or wild rice. Corn or whole wheat flour tortillas. Meats and other proteins Lean meats. Poultry. Tofu. Seafood and shellfish. Beans. Lentils. Dairy Low-fat or fat-free dairy products, such as yogurt, cottage cheese, or cheese. Beverages Water. Sugar-free drinks. Tea. Coffee. Low-fat or skim milk. Milk alternatives, such as unsweetened soy, oat, or almond milk. Real fruit juice. Fats and oils Avocado. Canola or  olive oil. Nuts and nut butters. Seeds. Seasonings and condiments Mustard. Relish. Low-fat, low-sugar ketchup and barbecue sauce. Low-fat or fat-free mayonnaise. Sweets and desserts Sugar-free sweets. The items listed above may  not be all the foods and drinks you can have. Talk to a dietitian to learn more. What foods should I limit or avoid? Grains White rice. Pasta. Breads. Meats and other proteins Limit red meat to 1-2 times a week. Dairy [employer]. Fats and oils Palm oil and coconut oil. Fried foods. Other foods Processed foods. Foods that contain a lot of salt (sodium) or added sugar. Sweets and desserts Sweets that contain sugar. Bakery items such as cookies, cakes, and other pastries. Beverages Sweetened drinks, such as sweet tea, milkshakes, iced sweet drinks, and sodas. Alcohol. The items listed above may not be all the foods and drinks you should avoid. Talk to a dietitian to learn more. Where to find more information The [employer] of Diabetes and Digestive and Kidney Diseases: [email or web address] This information is not intended to replace advice given to you by your health care provider. Make sure you discuss any questions you have with your health care provider. Document Revised: 02/04/2022 Document Reviewed: 02/04/2022 Elsevier Patient Education  2024 [employer].

## 2024-02-19 NOTE — Progress Notes (Signed)
 Office: [phone]  /  Fax: [phone]  WEIGHT SUMMARY AND BIOMETRICS  Weight Lost Since Last Visit: 3lb  Weight Gained Since Last Visit: 0lb   Vitals Temp: 98 F (36.7 C) BP: 104/72 Pulse Rate: 70 SpO2: 100 %   Anthropometric Measurements Height: 5' 2 (1.575 m) Weight: 144 lb (65.3 kg) BMI (Calculated): 26.33 Weight at Last Visit: 147lb Weight Lost Since Last Visit: 3lb Weight Gained Since Last Visit: 0lb Starting Weight: 167lb Total Weight Loss (lbs): 23 lb (10.4 kg)   Body Composition  Body Fat %: 33.3 % Fat Mass (lbs): 48.2 lbs Muscle Mass (lbs): 91.6 lbs Total Body Water (lbs): 62.6 lbs Visceral Fat Rating : 6   Other Clinical Data Fasting: Yes Labs: yes Today's Visit #: 27 Starting Date: 08/08/21    Total Weight Loss: 23 Percent of body weight lost:  13.7%   Bio Impedance Data reviewed with patient: Muscle is down 2 pounds, adipose stayed the same, PBF 33.3% and visceral fat 6  HPI  Chief Complaint: OBESITY  Carol Welch is here to discuss her progress with her obesity treatment plan. She is on the the Category 1 Plan and states she is following her eating plan approximately 100 % of the time. She states she is exercising 60 minutes 4 days per week- walking and yard work.   Interval History:  Since last office visit she has been adhering to her meal plan 100% of the time. She has been walking and doing yard work for 60 minutes 4 days a week.  She has started to do some strength training with her arms. She has been getting 60-70 grams of protein daily. She has not been skipping meals.  She is due for fasting labs today to recheck liver functions, insulin , Vit D, thyroid  and blood count.  She does have MASLD noted on U/S from 2023, she would like a repeat U/S to recheck liver since she has lost weight. She has been taking Ergocalciferol  50000 units daily without side effects. She continues on Metformin  500 mg every day for prediabetes and denies side  effects. She is also on Wellbutrin  for anxiety/depression and emotional eating behaviors- notices a large improvement of these behaviors with use of medication and denies side effects.  She is currently on levothyroxine  75 mcg every day , has not had TSH checked in over a year.    PHYSICAL EXAM:  Blood pressure 104/72, pulse 70, temperature 98 F (36.7 C), height 5' 2 (1.575 m), weight 144 lb (65.3 kg), last menstrual period 12/19/2019, SpO2 100%. Body mass index is 26.34 kg/m.  General: Well Developed, well nourished, and in no acute distress.  HEENT: Normocephalic, atraumatic; EOMI, sclerae are anicteric. Skin: Warm and dry, good turgor Chest:  Normal excursion, shape, no gross ABN Respiratory: No conversational dyspnea; speaking in full sentences NeuroM-Sk:  Normal gross ROM * 4 extremities  Psych: A and O X 3, insight adequate, mood- full    DIAGNOSTIC DATA REVIEWED:  BMET    Component Value Date/Time   NA 139 09/25/2023 1111   K 4.5 09/25/2023 1111   CL 104 09/25/2023 1111   CO2 19 (L) 09/25/2023 1111   GLUCOSE 87 09/25/2023 1111   GLUCOSE 94 02/27/2023 1153   BUN 13 09/25/2023 1111   CREATININE 0.77 09/25/2023 1111   CALCIUM  9.2 09/25/2023 1111   GFRNONAA >60 02/27/2023 1153   GFRAA >60 01/02/2020 1911   Lab Results  Component Value Date   HGBA1C 5.6 09/25/2023   HGBA1C  5.6 08/08/2021   Lab Results  Component Value Date   INSULIN  5.8 09/25/2023   INSULIN  5.7 08/08/2021   Lab Results  Component Value Date   TSH 3.440 12/18/2022   CBC    Component Value Date/Time   WBC 4.9 02/27/2023 1153   RBC 4.27 02/27/2023 1153   HGB 12.9 02/27/2023 1153   HGB 13.1 12/18/2022 1427   HCT 39.0 02/27/2023 1153   HCT 39.2 12/18/2022 1427   PLT 230 02/27/2023 1153   PLT 280 12/18/2022 1427   MCV 91.3 02/27/2023 1153   MCV 89 12/18/2022 1427   MCH 30.2 02/27/2023 1153   MCHC 33.1 02/27/2023 1153   RDW 13.8 02/27/2023 1153   RDW 13.0 12/18/2022 1427   Iron  Studies No results found for: IRON, TIBC, FERRITIN, IRONPCTSAT Lipid Panel     Component Value Date/Time   CHOL 251 (H) 12/18/2022 1427   TRIG 89 12/18/2022 1427   HDL 70 12/18/2022 1427   LDLCALC 166 (H) 12/18/2022 1427   Hepatic Function Panel     Component Value Date/Time   PROT 6.9 09/25/2023 1111   ALBUMIN 4.2 09/25/2023 1111   AST 15 09/25/2023 1111   ALT 11 09/25/2023 1111   ALKPHOS 63 09/25/2023 1111   BILITOT 0.4 09/25/2023 1111   BILIDIR <0.10 05/29/2021 1035   IBILI NOT CALCULATED 03/13/2021 1415      Component Value Date/Time   TSH 3.440 12/18/2022 1427   Nutritional Lab Results  Component Value Date   VD25OH 42.7 09/25/2023   VD25OH 41.4 12/18/2022   VD25OH 49.7 07/17/2022     ASSESSMENT AND PLAN  TREATMENT PLAN FOR OBESITY:  Recommended Dietary Goals  Carol Welch is currently in the action stage of change. As such, her goal is to continue weight management plan. She has agreed to the Category 1 Plan.  Behavioral Intervention  We discussed the following Behavioral Modification Strategies today: continue to work on maintaining a reduced calorie state, getting the recommended amount of protein, incorporating whole foods, making healthy choices, staying well hydrated and practicing mindfulness when eating. and increase protein intake, fibrous foods (25 grams per day for women, 30 grams for men) and water to improve satiety and decrease hunger signals. .  Additional resources provided today: NA  Recommended Physical Activity Goals  Carol Welch has been advised to work up to 240 minutes of moderate intensity aerobic activity a week and strengthening exercises 2-3 times per week for cardiovascular health, weight loss maintenance and preservation of muscle mass.   She has agreed to Think about enjoyable ways to increase daily physical activity and overcoming barriers to exercise, Increase physical activity in their day and reduce sedentary time (increase  NEAT)., Increase volume of physical activity to a goal of 240 minutes a week, and Combine aerobic and strengthening exercises for efficiency and improved cardiometabolic health.   Pharmacotherapy We discussed various medication options to help Carol Welch with her weight loss efforts and we both agreed to Continue Buproprion 150 mg every day, Metformin  500 mg every day and ergocalciferol  50000 units once a week. Denies side effects with all her medications and does believe they are all helping with her weight control as well.  ASSOCIATED CONDITIONS ADDRESSED TODAY  Action/Plan  Acquired hypothyroidism Currently on levothyroxine  75 mcg every day. Has not had TSH checked in over a year- will check today -     TSH  Pre-diabetes Continue Category 1  meal plan, limit simple carbohydrates, make sure to try to get  80 grams of protein daily Continue exercise with current goal of 240 minutes of moderate to high intensity exercise/week with strength training Continue Metformin  500 mg every day with meal and recheck CMP and insulin  today. Denies side effects with medication -     metFORMIN  HCl; Take 1 tablet (500 mg total) by mouth daily with breakfast.  Dispense: 90 tablet; Refill: 0 -     Insulin , random  Vitamin D  insufficiency Continue to supplement with Ergocalciferol  50000 units once a week , will recheck level today- denies side effects with medication  -     VITAMIN D  25 Hydroxy (Vit-D Deficiency, Fractures)  Metabolic dysfunction-associated steatotic liver disease (MASLD) Metformin  has been shown to help MASLD- continue Metformin  Will check CMP for liver enzymes today and she is interested in having another U/S since she has lost weight to evaluate liver- order today -     US  ABDOMEN LIMITED RUQ (LIVER/GB); Future  Pure hypercholesterolemia Continue category 1 meal plan, limit saturated fats, focus on getting 80 grams of protein daily Focus on getting 240 minutes a week of moderate to high  intensity exercise  Has been over a year since last lipids were checked, will check today -     Lipid panel  Depression with anxiety       Continue Lexapro  and follow with PCP       Continue Wellbutrin  150 mg every day for emotional eating behaviors as she states this helps control  Overweight with body mass index (BMI) of 26 to 26.9 in adult Increase protein to at least 80 grams /day.  Continue to push water to at least 64 ounces daily Continue aerobic activity and add more strength training to prevent muscle loss See plan above -     CBC with Differential/Platelet -     Comprehensive metabolic panel with GFR -     Lipid panel -     Insulin , random -     VITAMIN D  25 Hydroxy (Vit-D Deficiency, Fractures) -     TSH -     US  ABDOMEN LIMITED RUQ (LIVER/GB); Future         Return in about 4 weeks (around 03/18/2024).Carol Welch She was informed of the importance of frequent follow up visits to maximize her success with intensive lifestyle modifications for her multiple health conditions.   ATTESTASTION STATEMENTS:  Reviewed by clinician on day of visit: allergies, medications, problem list, medical history, surgical history, family history, social history, and previous encounter notes.     Carol Welch ANP-C

## 2024-02-20 ENCOUNTER — Ambulatory Visit (HOSPITAL_COMMUNITY)
Admission: RE | Admit: 2024-02-20 | Discharge: 2024-02-20 | Disposition: A | Discharge status: Home / Self Care | Source: Ambulatory Visit | Attending: Gastroenterology | Admitting: Gastroenterology

## 2024-02-20 ENCOUNTER — Other Ambulatory Visit: Payer: Self-pay | Admitting: Gastroenterology

## 2024-02-20 DIAGNOSIS — Z1501 Genetic susceptibility to malignant neoplasm of breast: Secondary | ICD-10-CM | POA: Insufficient documentation

## 2024-02-20 DIAGNOSIS — Z1289 Encounter for screening for malignant neoplasm of other sites: Secondary | ICD-10-CM | POA: Insufficient documentation

## 2024-02-20 DIAGNOSIS — Z1509 Genetic susceptibility to other malignant neoplasm: Secondary | ICD-10-CM | POA: Diagnosis not present

## 2024-02-20 DIAGNOSIS — C259 Malignant neoplasm of pancreas, unspecified: Secondary | ICD-10-CM | POA: Diagnosis not present

## 2024-02-20 DIAGNOSIS — K7689 Other specified diseases of liver: Secondary | ICD-10-CM | POA: Diagnosis not present

## 2024-02-20 MED ORDER — GADOBUTROL 1 MMOL/ML IV SOLN
6.0000 mL | Freq: Once | INTRAVENOUS | Status: AC | PRN
  Administered 2024-02-20: 6 mL via INTRAVENOUS

## 2024-02-21 LAB — CBC WITH DIFFERENTIAL/PLATELET
Basophils Absolute: 0 x10E3/uL (ref 0.0–0.2)
Basos: 1 %
EOS (ABSOLUTE): 0.1 x10E3/uL (ref 0.0–0.4)
Eos: 1 %
Hematocrit: 40.9 % (ref 34.0–46.6)
Hemoglobin: 13.4 g/dL (ref 11.1–15.9)
Immature Grans (Abs): 0 x10E3/uL (ref 0.0–0.1)
Immature Granulocytes: 0 %
Lymphocytes Absolute: 1.7 x10E3/uL (ref 0.7–3.1)
Lymphs: 33 %
MCH: 30 pg (ref 26.6–33.0)
MCHC: 32.8 g/dL (ref 31.5–35.7)
MCV: 92 fL (ref 79–97)
Monocytes Absolute: 0.3 x10E3/uL (ref 0.1–0.9)
Monocytes: 7 %
Neutrophils Absolute: 2.9 x10E3/uL (ref 1.4–7.0)
Neutrophils: 58 %
Platelets: 257 x10E3/uL (ref 150–450)
RBC: 4.47 x10E6/uL (ref 3.77–5.28)
RDW: 13.1 % (ref 11.7–15.4)
WBC: 5 x10E3/uL (ref 3.4–10.8)

## 2024-02-21 LAB — COMPREHENSIVE METABOLIC PANEL WITH GFR
ALT: 7 IU/L (ref 0–32)
AST: 16 IU/L (ref 0–40)
Albumin: 4.2 g/dL (ref 3.9–4.9)
Alkaline Phosphatase: 53 IU/L (ref 41–116)
BUN/Creatinine Ratio: 24 — ABNORMAL HIGH (ref 9–23)
BUN: 19 mg/dL (ref 6–24)
Bilirubin Total: 0.3 mg/dL (ref 0.0–1.2)
CO2: 21 mmol/L (ref 20–29)
Calcium: 9 mg/dL (ref 8.7–10.2)
Chloride: 104 mmol/L (ref 96–106)
Creatinine, Ser: 0.79 mg/dL (ref 0.57–1.00)
Globulin, Total: 2.6 g/dL (ref 1.5–4.5)
Glucose: 78 mg/dL (ref 70–99)
Potassium: 4.1 mmol/L (ref 3.5–5.2)
Sodium: 141 mmol/L (ref 134–144)
Total Protein: 6.8 g/dL (ref 6.0–8.5)
eGFR: 93 mL/min/1.73 (ref >59)

## 2024-02-21 LAB — TSH: TSH: 6.67 u[IU]/mL — ABNORMAL HIGH (ref 0.450–4.500)

## 2024-02-21 LAB — LIPID PANEL
Chol/HDL Ratio: 3.5 ratio (ref 0.0–4.4)
Cholesterol, Total: 213 mg/dL — ABNORMAL HIGH (ref 100–199)
HDL: 61 mg/dL (ref >39)
LDL Chol Calc (NIH): 139 mg/dL — ABNORMAL HIGH (ref 0–99)
Triglycerides: 71 mg/dL (ref 0–149)
VLDL Cholesterol Cal: 13 mg/dL (ref 5–40)

## 2024-02-21 LAB — VITAMIN D 25 HYDROXY (VIT D DEFICIENCY, FRACTURES): Vit D, 25-Hydroxy: 39.3 ng/mL (ref 30.0–100.0)

## 2024-02-21 LAB — INSULIN, RANDOM: INSULIN: 4.3 u[IU]/mL (ref 2.6–24.9)

## 2024-02-22 ENCOUNTER — Ambulatory Visit: Payer: Self-pay | Admitting: Gastroenterology

## 2024-02-23 ENCOUNTER — Ambulatory Visit (INDEPENDENT_AMBULATORY_CARE_PROVIDER_SITE_OTHER): Payer: Self-pay | Admitting: Nurse Practitioner

## 2024-02-23 ENCOUNTER — Ambulatory Visit (HOSPITAL_COMMUNITY)
Admission: RE | Admit: 2024-02-23 | Discharge: 2024-02-23 | Disposition: A | Discharge status: Home / Self Care | Source: Ambulatory Visit | Attending: Nurse Practitioner | Admitting: Nurse Practitioner

## 2024-02-23 DIAGNOSIS — K760 Fatty (change of) liver, not elsewhere classified: Secondary | ICD-10-CM | POA: Insufficient documentation

## 2024-02-23 DIAGNOSIS — E039 Hypothyroidism, unspecified: Secondary | ICD-10-CM

## 2024-02-23 DIAGNOSIS — E663 Overweight: Secondary | ICD-10-CM | POA: Insufficient documentation

## 2024-02-23 DIAGNOSIS — Z6826 Body mass index (BMI) 26.0-26.9, adult: Secondary | ICD-10-CM | POA: Insufficient documentation

## 2024-02-23 DIAGNOSIS — K7689 Other specified diseases of liver: Secondary | ICD-10-CM | POA: Diagnosis not present

## 2024-02-23 MED ORDER — LEVOTHYROXINE SODIUM 88 MCG PO TABS: 88.0000 ug | ORAL_TABLET | Freq: Every day | ORAL | 0 refills | Status: DC

## 2024-03-04 ENCOUNTER — Ambulatory Visit
Admission: RE | Admit: 2024-03-04 | Discharge: 2024-03-04 | Disposition: A | Source: Ambulatory Visit | Attending: Family Medicine

## 2024-03-04 DIAGNOSIS — R928 Other abnormal and inconclusive findings on diagnostic imaging of breast: Secondary | ICD-10-CM | POA: Diagnosis not present

## 2024-03-04 DIAGNOSIS — N6313 Unspecified lump in the right breast, lower outer quadrant: Secondary | ICD-10-CM

## 2024-03-04 DIAGNOSIS — N6489 Other specified disorders of breast: Secondary | ICD-10-CM | POA: Diagnosis not present

## 2024-03-17 DIAGNOSIS — Z419 Encounter for procedure for purposes other than remedying health state, unspecified: Secondary | ICD-10-CM | POA: Diagnosis not present

## 2024-03-18 ENCOUNTER — Ambulatory Visit (INDEPENDENT_AMBULATORY_CARE_PROVIDER_SITE_OTHER): Payer: Self-pay | Admitting: Nurse Practitioner

## 2024-03-24 ENCOUNTER — Ambulatory Visit (INDEPENDENT_AMBULATORY_CARE_PROVIDER_SITE_OTHER): Admitting: Nurse Practitioner

## 2024-03-30 ENCOUNTER — Encounter (INDEPENDENT_AMBULATORY_CARE_PROVIDER_SITE_OTHER): Payer: Self-pay | Admitting: Family Medicine

## 2024-03-30 ENCOUNTER — Ambulatory Visit (INDEPENDENT_AMBULATORY_CARE_PROVIDER_SITE_OTHER): Admitting: Family Medicine

## 2024-03-30 VITALS — BP 104/70 | HR 87 | Temp 98.8°F | Ht 62.0 in | Wt 142.0 lb

## 2024-03-30 DIAGNOSIS — F3289 Other specified depressive episodes: Secondary | ICD-10-CM | POA: Diagnosis not present

## 2024-03-30 DIAGNOSIS — F5089 Other specified eating disorder: Secondary | ICD-10-CM | POA: Diagnosis not present

## 2024-03-30 DIAGNOSIS — E559 Vitamin D deficiency, unspecified: Secondary | ICD-10-CM

## 2024-03-30 DIAGNOSIS — Z6825 Body mass index (BMI) 25.0-25.9, adult: Secondary | ICD-10-CM | POA: Diagnosis not present

## 2024-03-30 DIAGNOSIS — E669 Obesity, unspecified: Secondary | ICD-10-CM | POA: Diagnosis not present

## 2024-03-30 DIAGNOSIS — E039 Hypothyroidism, unspecified: Secondary | ICD-10-CM

## 2024-03-30 MED ORDER — ERGOCALCIFEROL 1.25 MG (50000 UT) PO CAPS: 50000.0000 [IU] | ORAL_CAPSULE | ORAL | 0 refills | Status: DC

## 2024-03-30 MED ORDER — BUPROPION HCL ER (SR) 150 MG PO TB12: 150.0000 mg | ORAL_TABLET | Freq: Every day | ORAL | 0 refills | Status: AC

## 2024-03-30 MED ORDER — LEVOTHYROXINE SODIUM 88 MCG PO TABS: 88.0000 ug | ORAL_TABLET | Freq: Every day | ORAL | 0 refills | Status: AC

## 2024-03-30 NOTE — Progress Notes (Signed)
   SUBJECTIVE:  Chief Complaint: Obesity  Interim History: she eats off plan sometimes but otherwise is eating mostly on plan with vegetables and protein and a small bit of fruit.  She is 30 minutes to 1 hour outside doing yardwork.  She is doing weight 2-3 times a week for a few minutes. She is occasionally eating indulgently and may eat 2-3 chicken wings either with teriyaki or spicy sauce.  Trying to stay away from sweets and pasta.  Planning to stay home for Thanksgiving and is planning to cook.  Family is coming to her house starting tomorrow night. For month of December she is planning to go to [place]  and stay with her son and her sister.    Carol Welch is here to discuss her progress with her obesity treatment plan. She is on the Category 1 Plan and states she is following her eating plan approximately 95 % of the time. She states she is walking 30-60 minutes 4-5 times per week.   OBJECTIVE: Visit Diagnoses: Problem List Items Addressed This Visit   None   Vitals Temp: 98.8 F (37.1 C) BP: 104/70 Pulse Rate: 87 SpO2: 98 %   Anthropometric Measurements Height: 5' 2 (1.575 m) Weight: 142 lb (64.4 kg) BMI (Calculated): 25.97 Weight at Last Visit: 144 lb Weight Lost Since Last Visit: 2 Weight Gained Since Last Visit: 0 Starting Weight: 167 lb Total Weight Loss (lbs): 25 lb (11.3 kg)   Body Composition  Body Fat %: 33 % Fat Mass (lbs): 47 lbs Muscle Mass (lbs): 90.8 lbs Total Body Water (lbs): 64 lbs Visceral Fat Rating : 6   Other Clinical Data Today's Visit #: 28 Starting Date: 08/08/21 Comments: Cat 1     ASSESSMENT AND PLAN: Assessment & Plan Other depression, with emotional eating behavior Patient reports improvement in mood as well as dietary intake control with usage of Wellbutrin .  Needs refill of Wellbutrin  today.  Continue current prescription strength of the 150 mg daily.  Blood pressure is well-controlled and patient denies any other side  effects. Vitamin D  insufficiency Tolerating prescription strength vitamin D  and needs refill today. Acquired hypothyroidism Most recent TSH elevated at 6.67.  Will need repeat TSH at next appointment to ensure control with dosage change.  Continue current prescription of 88 mcg daily and needs refill today. BMI 25.0-25.9,adult  Obesity, Beginning BMI 30.54    Diet: Carol Welch is currently in the action stage of change. As such, her goal is to continue with weight loss efforts and has agreed to the Category 1 Plan.   Exercise:  All adults should avoid inactivity. Some activity is better than none, and adults who participate in any amount of physical activity, gain some health benefits.  Behavior Modification:  We discussed the following Behavioral Modification Strategies today: increasing lean protein intake, decreasing simple carbohydrates, meal planning and cooking strategies, and planning for success.   No follow-ups on file.   She was informed of the importance of frequent follow up visits to maximize her success with intensive lifestyle modifications for her multiple health conditions.  Attestation Statements:   Reviewed by clinician on day of visit: allergies, medications, problem list, medical history, surgical history, family history, social history, and previous encounter notes.     Carol Cho, MD

## 2024-04-04 NOTE — Assessment & Plan Note (Signed)
 Tolerating prescription strength vitamin D  and needs refill today.

## 2024-04-04 NOTE — Assessment & Plan Note (Signed)
 Most recent TSH elevated at 6.67.  Will need repeat TSH at next appointment to ensure control with dosage change.  Continue current prescription of 88 mcg daily and needs refill today.

## 2024-04-08 DIAGNOSIS — F331 Major depressive disorder, recurrent, moderate: Secondary | ICD-10-CM | POA: Diagnosis not present

## 2024-04-16 DIAGNOSIS — Z419 Encounter for procedure for purposes other than remedying health state, unspecified: Secondary | ICD-10-CM | POA: Diagnosis not present

## 2024-05-11 ENCOUNTER — Ambulatory Visit (INDEPENDENT_AMBULATORY_CARE_PROVIDER_SITE_OTHER): Admitting: Family Medicine

## 2024-05-11 ENCOUNTER — Encounter (INDEPENDENT_AMBULATORY_CARE_PROVIDER_SITE_OTHER): Payer: Self-pay | Admitting: Family Medicine

## 2024-05-11 VITALS — BP 96/60 | HR 68 | Temp 98.2°F | Ht 62.0 in | Wt 146.0 lb

## 2024-05-11 DIAGNOSIS — E559 Vitamin D deficiency, unspecified: Secondary | ICD-10-CM | POA: Diagnosis not present

## 2024-05-11 DIAGNOSIS — Z6826 Body mass index (BMI) 26.0-26.9, adult: Secondary | ICD-10-CM

## 2024-05-11 DIAGNOSIS — R7303 Prediabetes: Secondary | ICD-10-CM | POA: Diagnosis not present

## 2024-05-11 DIAGNOSIS — E669 Obesity, unspecified: Secondary | ICD-10-CM

## 2024-05-11 MED ORDER — METFORMIN HCL 500 MG PO TABS: 500.0000 mg | ORAL_TABLET | Freq: Every day | ORAL | 0 refills | Status: AC

## 2024-05-11 MED ORDER — ERGOCALCIFEROL 1.25 MG (50000 UT) PO CAPS: 50000.0000 [IU] | ORAL_CAPSULE | ORAL | 0 refills | Status: AC

## 2024-05-11 NOTE — Progress Notes (Signed)
 "  SUBJECTIVE:  Chief Complaint: Obesity  Interim History: Patient mentions she did not exercise much over the last 6 weeks and cooked more food for her family that was visiting.  She recognizes she was less structured than she normally is.  Over the next month she does not have any plans or activities.  She voices her husband is sick currently.  Mentions structured plan will be easiest to stay consistent with and get back on track to over the next 6 weeks.   Carol Welch is here to discuss her progress with her obesity treatment plan. She is on the Category 1 Plan and states she is following her eating plan approximately 50 % of the time. She states she is not exercising.    OBJECTIVE: Visit Diagnoses: Problem List Items Addressed This Visit       Other   Vitamin D  insufficiency - Primary   Relevant Medications   ergocalciferol  (VITAMIN D2) 1.25 MG (50000 UT) capsule   BMI 26.0-26.9,adult   Obesity, Beginning BMI 30.54   Relevant Medications   metFORMIN  (GLUCOPHAGE ) 500 MG tablet   Pre-diabetes   Relevant Medications   metFORMIN  (GLUCOPHAGE ) 500 MG tablet    Vitals Temp: 98.2 F (36.8 C) BP: 96/60 Pulse Rate: 68 SpO2: 100 %   Anthropometric Measurements Height: 5' 2 (1.575 m) Weight: 146 lb (66.2 kg) BMI (Calculated): 26.7 Weight at Last Visit: 142 lb Weight Lost Since Last Visit: 0 Weight Gained Since Last Visit: 4 Total Weight Loss (lbs): 167 lb (75.8 kg)   Body Composition  Body Fat %: 34 % Fat Mass (lbs): 49.6 lbs Muscle Mass (lbs): 91.4 lbs Total Body Water (lbs): 64.4 lbs Visceral Fat Rating : 6   Other Clinical Data Today's Visit #: 22 Starting Date: 08/08/21 Comments: Cat 1     ASSESSMENT AND PLAN: Assessment & Plan Vitamin D  insufficiency On prescription strength Vitamin D  and needs a refill today.  No nausea, vomiting or muscle weakness mentioned.  Refill sent in. Pre-diabetes Tolerating metformin  at current dose.  No change in dosage on  refill sent into pharmacy.  Repeat labs in 3 months for re-evaluation. BMI 26.0-26.9,adult  Obesity, Beginning BMI 30.54    Diet: Carol Welch is currently in the action stage of change. As such, her goal is to continue with weight loss efforts and has agreed to the Category 1 Plan.   Exercise:  For substantial health benefits, adults should do at least 150 minutes (2 hours and 30 minutes) a week of moderate-intensity, or 75 minutes (1 hour and 15 minutes) a week of vigorous-intensity aerobic physical activity, or an equivalent combination of moderate- and vigorous-intensity aerobic activity. Aerobic activity should be performed in episodes of at least 10 minutes, and preferably, it should be spread throughout the week.  Planning to start her garden at the end of this month so she will be busy working out in her yard.   Behavior Modification:  We discussed the following Behavioral Modification Strategies today: increasing lean protein intake, decreasing simple carbohydrates, increasing vegetables, meal planning and cooking strategies, avoiding temptations, and planning for success.   Return in about 6 weeks (around 06/22/2024) for fasting labs and IC.   She was informed of the importance of frequent follow up visits to maximize her success with intensive lifestyle modifications for her multiple health conditions.  Attestation Statements:   Reviewed by clinician on day of visit: allergies, medications, problem list, medical history, surgical history, family history, social history, and previous encounter notes.  Adelita Cho, MD "

## 2024-05-14 NOTE — Assessment & Plan Note (Signed)
 On prescription strength Vitamin D  and needs a refill today.  No nausea, vomiting or muscle weakness mentioned.  Refill sent in.

## 2024-05-14 NOTE — Assessment & Plan Note (Signed)
 Tolerating metformin  at current dose.  No change in dosage on refill sent into pharmacy.  Repeat labs in 3 months for re-evaluation.

## 2024-06-11 ENCOUNTER — Other Ambulatory Visit: Payer: Self-pay

## 2024-06-11 DIAGNOSIS — F331 Major depressive disorder, recurrent, moderate: Secondary | ICD-10-CM

## 2024-06-24 ENCOUNTER — Ambulatory Visit (INDEPENDENT_AMBULATORY_CARE_PROVIDER_SITE_OTHER): Admitting: Family Medicine

## 2024-10-12 ENCOUNTER — Ambulatory Visit: Admitting: Dermatology
